# Patient Record
Sex: Female | Born: 1965 | Race: White | Hispanic: No | Smoking: Former smoker
Health system: Southern US, Community
[De-identification: ages and names within clinical notes are randomized; demographics above are authoritative.]

## PROBLEM LIST (undated history)

## (undated) DIAGNOSIS — G2581 Restless legs syndrome: Secondary | ICD-10-CM

## (undated) DIAGNOSIS — J449 Chronic obstructive pulmonary disease, unspecified: Secondary | ICD-10-CM

## (undated) DIAGNOSIS — M545 Low back pain, unspecified: Secondary | ICD-10-CM

## (undated) DIAGNOSIS — G4700 Insomnia, unspecified: Secondary | ICD-10-CM

## (undated) DIAGNOSIS — M1990 Unspecified osteoarthritis, unspecified site: Secondary | ICD-10-CM

## (undated) DIAGNOSIS — R799 Abnormal finding of blood chemistry, unspecified: Secondary | ICD-10-CM

## (undated) DIAGNOSIS — F411 Generalized anxiety disorder: Secondary | ICD-10-CM

## (undated) DIAGNOSIS — Z972 Presence of dental prosthetic device (complete) (partial): Secondary | ICD-10-CM

## (undated) DIAGNOSIS — K219 Gastro-esophageal reflux disease without esophagitis: Secondary | ICD-10-CM

## (undated) DIAGNOSIS — I209 Angina pectoris, unspecified: Secondary | ICD-10-CM

## (undated) DIAGNOSIS — F319 Bipolar disorder, unspecified: Secondary | ICD-10-CM

## (undated) DIAGNOSIS — C569 Malignant neoplasm of unspecified ovary: Secondary | ICD-10-CM

## (undated) DIAGNOSIS — F13239 Sedative, hypnotic or anxiolytic dependence with withdrawal, unspecified: Secondary | ICD-10-CM

## (undated) DIAGNOSIS — K8020 Calculus of gallbladder without cholecystitis without obstruction: Secondary | ICD-10-CM

## (undated) DIAGNOSIS — C801 Malignant (primary) neoplasm, unspecified: Secondary | ICD-10-CM

## (undated) DIAGNOSIS — E785 Hyperlipidemia, unspecified: Secondary | ICD-10-CM

## (undated) DIAGNOSIS — F419 Anxiety disorder, unspecified: Secondary | ICD-10-CM

## (undated) DIAGNOSIS — R945 Abnormal results of liver function studies: Secondary | ICD-10-CM

## (undated) HISTORY — DX: Calculus of gallbladder without cholecystitis without obstruction: K80.20

## (undated) HISTORY — DX: Abnormal finding of blood chemistry, unspecified: R79.9

## (undated) HISTORY — DX: Unspecified osteoarthritis, unspecified site: M19.90

## (undated) HISTORY — DX: Bipolar disorder, unspecified: F31.9

## (undated) HISTORY — PX: ABDOMINAL HYSTERECTOMY: SHX81

## (undated) HISTORY — PX: BREAST BIOPSY: SHX20

## (undated) HISTORY — PX: OOPHORECTOMY: SHX86

## (undated) HISTORY — PX: MULTIPLE TOOTH EXTRACTIONS: SHX2053

## (undated) HISTORY — DX: Restless legs syndrome: G25.81

## (undated) HISTORY — DX: Generalized anxiety disorder: F41.1

## (undated) HISTORY — DX: Chronic obstructive pulmonary disease, unspecified: J44.9

## (undated) HISTORY — DX: Anxiety disorder, unspecified: F41.9

## (undated) HISTORY — DX: Hyperlipidemia, unspecified: E78.5

## (undated) HISTORY — PX: APPENDECTOMY: SHX54

## (undated) HISTORY — DX: Abnormal results of liver function studies: R94.5

---

## 1993-10-26 DIAGNOSIS — C569 Malignant neoplasm of unspecified ovary: Secondary | ICD-10-CM

## 1993-10-26 DIAGNOSIS — C801 Malignant (primary) neoplasm, unspecified: Secondary | ICD-10-CM

## 1993-10-26 HISTORY — DX: Malignant neoplasm of unspecified ovary: C56.9

## 1993-10-26 HISTORY — DX: Malignant (primary) neoplasm, unspecified: C80.1

## 2003-05-17 ENCOUNTER — Emergency Department (HOSPITAL_COMMUNITY): Admission: EM | Admit: 2003-05-17 | Discharge: 2003-05-17 | Payer: Self-pay | Admitting: *Deleted

## 2003-10-27 HISTORY — PX: LIVER BIOPSY: SHX301

## 2004-09-28 ENCOUNTER — Emergency Department: Payer: Self-pay | Admitting: Unknown Physician Specialty

## 2004-11-06 ENCOUNTER — Ambulatory Visit: Payer: Self-pay | Admitting: Internal Medicine

## 2004-11-26 DIAGNOSIS — R7989 Other specified abnormal findings of blood chemistry: Secondary | ICD-10-CM

## 2004-11-26 HISTORY — DX: Other specified abnormal findings of blood chemistry: R79.89

## 2004-12-27 ENCOUNTER — Ambulatory Visit: Payer: Self-pay | Admitting: Internal Medicine

## 2006-07-20 ENCOUNTER — Emergency Department: Payer: Self-pay | Admitting: Unknown Physician Specialty

## 2006-07-21 ENCOUNTER — Ambulatory Visit: Payer: Self-pay | Admitting: Emergency Medicine

## 2007-06-26 ENCOUNTER — Emergency Department: Payer: Self-pay | Admitting: Emergency Medicine

## 2007-08-26 ENCOUNTER — Emergency Department: Payer: Self-pay | Admitting: Emergency Medicine

## 2008-11-05 ENCOUNTER — Inpatient Hospital Stay: Payer: Self-pay | Admitting: Unknown Physician Specialty

## 2009-01-13 ENCOUNTER — Emergency Department (HOSPITAL_COMMUNITY): Admission: EM | Admit: 2009-01-13 | Discharge: 2009-01-13 | Payer: Self-pay | Admitting: Emergency Medicine

## 2011-02-05 LAB — BASIC METABOLIC PANEL
CO2: 26 mEq/L (ref 19–32)
Chloride: 101 mEq/L (ref 96–112)
GFR calc Af Amer: >60 mL/min (ref >60)
Potassium: 3.2 mEq/L — ABNORMAL LOW (ref 3.5–5.1)
Sodium: 136 mEq/L (ref 135–145)

## 2011-02-05 LAB — CBC
HCT: 39 % (ref 36.0–46.0)
Hemoglobin: 13.8 g/dL (ref 12.0–15.0)
MCHC: 35.5 g/dL (ref 30.0–36.0)
MCV: 89.8 fL (ref 78.0–100.0)
RBC: 4.34 MIL/uL (ref 3.87–5.11)
WBC: 8.6 10*3/uL (ref 4.0–10.5)

## 2011-02-05 LAB — RAPID URINE DRUG SCREEN, HOSP PERFORMED
Barbiturates: NOT DETECTED
Benzodiazepines: NOT DETECTED

## 2011-02-05 LAB — ETHANOL: Alcohol, Ethyl (B): <5 mg/dL (ref 0–10)

## 2011-02-05 LAB — DIFFERENTIAL
Basophils Relative: 0 % (ref 0–1)
Eosinophils Absolute: 0 10*3/uL (ref 0.0–0.7)
Eosinophils Relative: 0 % (ref 0–5)
Lymphs Abs: 1.9 10*3/uL (ref 0.7–4.0)
Monocytes Absolute: 0.4 10*3/uL (ref 0.1–1.0)
Monocytes Relative: 5 % (ref 3–12)
Neutrophils Relative %: 72 % (ref 43–77)

## 2011-04-15 ENCOUNTER — Encounter: Payer: Self-pay | Admitting: Family Medicine

## 2011-06-18 ENCOUNTER — Emergency Department (HOSPITAL_COMMUNITY): Payer: No Typology Code available for payment source

## 2011-06-18 ENCOUNTER — Emergency Department (HOSPITAL_COMMUNITY)
Admission: EM | Admit: 2011-06-18 | Discharge: 2011-06-18 | Disposition: A | Discharge status: Home / Self Care | Payer: No Typology Code available for payment source | Attending: Emergency Medicine | Admitting: Emergency Medicine

## 2011-06-18 ENCOUNTER — Encounter (HOSPITAL_COMMUNITY): Payer: Self-pay | Admitting: Emergency Medicine

## 2011-06-18 DIAGNOSIS — E785 Hyperlipidemia, unspecified: Secondary | ICD-10-CM | POA: Insufficient documentation

## 2011-06-18 DIAGNOSIS — S161XXA Strain of muscle, fascia and tendon at neck level, initial encounter: Secondary | ICD-10-CM

## 2011-06-18 DIAGNOSIS — J4489 Other specified chronic obstructive pulmonary disease: Secondary | ICD-10-CM | POA: Insufficient documentation

## 2011-06-18 DIAGNOSIS — F319 Bipolar disorder, unspecified: Secondary | ICD-10-CM | POA: Insufficient documentation

## 2011-06-18 DIAGNOSIS — S139XXA Sprain of joints and ligaments of unspecified parts of neck, initial encounter: Secondary | ICD-10-CM | POA: Insufficient documentation

## 2011-06-18 DIAGNOSIS — J449 Chronic obstructive pulmonary disease, unspecified: Secondary | ICD-10-CM | POA: Insufficient documentation

## 2011-06-18 MED ORDER — CYCLOBENZAPRINE HCL 10 MG PO TABS: 10.0000 mg | ORAL_TABLET | Freq: Two times a day (BID) | ORAL | Status: AC | PRN

## 2011-06-18 MED ORDER — IBUPROFEN 600 MG PO TABS: 600.0000 mg | ORAL_TABLET | Freq: Four times a day (QID) | ORAL | Status: AC | PRN
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
## 2011-06-22 NOTE — ED Provider Notes (Signed)
Medical screening examination/treatment/procedure(s) were performed by non-physician practitioner and as supervising physician I was immediately available for consultation/collaboration.  Benny Lennert, MD 06/22/11 1021

## 2012-06-16 ENCOUNTER — Ambulatory Visit: Payer: Self-pay | Admitting: Family Medicine

## 2013-02-08 ENCOUNTER — Other Ambulatory Visit: Payer: Self-pay | Admitting: Family Medicine

## 2013-02-08 ENCOUNTER — Telehealth: Payer: Self-pay | Admitting: Physician Assistant

## 2013-02-08 MED ORDER — ROSUVASTATIN CALCIUM 10 MG PO TABS: 10.0000 mg | ORAL_TABLET | Freq: Every day | ORAL | Status: DC

## 2013-02-08 NOTE — Telephone Encounter (Signed)
MBD This is your pt.

## 2013-02-08 NOTE — Telephone Encounter (Signed)
Medication refilled per protocol. 

## 2013-02-08 NOTE — Telephone Encounter (Signed)
Ok to give 2 additional refills on risperdal and prn refills on welchol

## 2013-02-08 NOTE — Telephone Encounter (Signed)
..?   OK to Refill Was not sure about refilling the Risperdol

## 2013-03-08 ENCOUNTER — Encounter: Payer: Self-pay | Admitting: Physician Assistant

## 2013-03-08 ENCOUNTER — Ambulatory Visit (INDEPENDENT_AMBULATORY_CARE_PROVIDER_SITE_OTHER): Payer: Medicare Other | Admitting: Physician Assistant

## 2013-03-08 VITALS — BP 100/70 | HR 82 | Temp 97.5°F | Resp 18 | Ht 62.5 in | Wt 177.0 lb

## 2013-03-08 DIAGNOSIS — F319 Bipolar disorder, unspecified: Secondary | ICD-10-CM

## 2013-03-08 DIAGNOSIS — J449 Chronic obstructive pulmonary disease, unspecified: Secondary | ICD-10-CM | POA: Insufficient documentation

## 2013-03-08 DIAGNOSIS — M26609 Unspecified temporomandibular joint disorder, unspecified side: Secondary | ICD-10-CM

## 2013-03-08 DIAGNOSIS — G2581 Restless legs syndrome: Secondary | ICD-10-CM

## 2013-03-08 DIAGNOSIS — J4489 Other specified chronic obstructive pulmonary disease: Secondary | ICD-10-CM

## 2013-03-08 DIAGNOSIS — E785 Hyperlipidemia, unspecified: Secondary | ICD-10-CM

## 2013-03-08 DIAGNOSIS — R7989 Other specified abnormal findings of blood chemistry: Secondary | ICD-10-CM

## 2013-03-08 HISTORY — DX: Bipolar disorder, unspecified: F31.9

## 2013-03-08 MED ORDER — BUPROPION HCL ER (SR) 150 MG PO TB12: 150.0000 mg | ORAL_TABLET | Freq: Every day | ORAL | Status: DC

## 2013-03-08 NOTE — Progress Notes (Signed)
Patient ID: Stacy Chambers MRN: [id number], DOB: [birth year], 47 y.o. Date of Encounter: @DATE @  Chief Complaint:  Chief Complaint  Patient presents with  . other    h/a,j aw poppin    HPI: 47 y.o. year old female  presents for:  1- Last week, and again last night, when eating a sandwich, her left jaw locked.It has been popping some. Really doesnot feel much pain there. She does admit to "bad habit of grinding her teeth."   2- F/U Bipolar D/O: AT LOV 12/28/12 she reported that her insurance had changed. Her psychiatrist does not accept her new insurance. As well, her new insurance does not cover Saphris, one of the psych meds she was on at that time. At that time, we replaced Saphris with Risperdal 0.5mg  BID. She says this is working well. Her mood has been stable and she feels that current meds are working perfectly. Has no adverse effects.   3- Hyperlipidemia: On Crestor and Welchol. No myalgias or other adverse effects.    Past Medical History  Diagnosis Date  . RLS (restless legs syndrome)   . Bipolar disorder   . COPD (chronic obstructive pulmonary disease)   . Hyperlipidemia   . Elevated liver function tests 11/26/2004    H/O NEGATIVE LIVER BIOPSY, workup negative  . Restless legs syndrome (RLS)      Home Meds: See attached medication section for current medication list. Any medications entered into computer today will not appear on this note's list. The medications listed below were entered prior to today. Current Outpatient Prescriptions on File Prior to Visit  Medication Sig Dispense Refill  . clonazePAM (KLONOPIN) 1 MG tablet Take 1 mg by mouth 2 (two) times daily.        . risperiDONE (RISPERDAL) 0.5 MG tablet TAKE 1 TABLET TWICE A DAY  60 tablet  2  . rOPINIRole (REQUIP) 1 MG tablet Take 1 mg by mouth at bedtime.        . rosuvastatin (CRESTOR) 10 MG tablet Take 1 tablet (10 mg total) by mouth daily.  30 tablet  2  . WELCHOL 625 MG tablet TAKE 3 TABS BY MOUTH TWICE  DAILY  180 tablet  11   No current facility-administered medications on file prior to visit.    Allergies:  Allergies  Allergen Reactions  . Benadryl (Diphenhydramine Hcl)     Hives/ rapid heartrate    History   Social History  . Marital Status: Married    Spouse Name: N/A    Number of Children: N/A  . Years of Education: N/A   Occupational History  . Not on file.   Social History Main Topics  . Smoking status: Never Smoker   . Smokeless tobacco: Not on file  . Alcohol Use: No  . Drug Use: Yes    Special: Marijuana  . Sexually Active: Yes    Birth Control/ Protection: Surgical   Other Topics Concern  . Not on file   Social History Narrative  . No narrative on file    Family History  Problem Relation Age of Onset  . Cancer Mother   . Cancer Father   . COPD Father   . Stroke Father      Review of Systems:  See HPI for pertinent ROS. All other ROS negative.    Physical Exam: Blood pressure 100/70, pulse 82, temperature 97.5 F (36.4 C), temperature source Oral, resp. rate 18, height 5' 2.5" (1.588 m), weight 177 lb (80.287 kg).,  Body mass index is 31.84 kg/(m^2). General: WNWD WF.  Appears in no acute distress. Head: Normocephalic, atraumatic, eyes without discharge, sclera non-icteric, nares are without discharge. Bilateral auditory canals clear, TM's are without perforation, pearly grey and translucent with reflective cone of light bilaterally. Oral cavity moist, posterior pharynx without exudate, erythema, peritonsillar abscess, or post nasal drip. Minimal tenderness with palpation of TM Joint. No popping, crepitus, of joint with opening and closing of jaw.    Neck: Supple. No thyromegaly. No lymphadenopathy. Lungs: Clear bilaterally to auscultation without wheezes, rales, or rhonchi. Breathing is unlabored. Heart: RRR with S1 S2. No murmurs, rubs, or gallops. Abdomen: No tenderness with palpation. No mass, organomegaly. Musculoskeletal:  Strength and tone  normal for age. Extremities/Skin: Warm and dry. No clubbing or cyanosis. No edema. No rashes or suspicious lesions. Neuro: Alert and oriented X 3. Moves all extremities spontaneously. Gait is normal. CNII-XII grossly in tact. Psych:  Responds to questions appropriately with a normal affect.     ASSESSMENT AND PLAN:  47 y.o. year old female with  1. TMJ (temporomandibular joint disorder) Avoid taking big bites of food. Avoid hard, crunchy foods. Must stop teeth grinding. Take OTC Nsaids with food for 2 weeks. If does not resolve, f/u with Dentist/TMJ specialist. May need mouth guard.   2. Bipolar disorder, unspecified Well controlled on current meds. Wil cont current meds. She still needs to sched f/u with a new psychiatrist for f/u. Cont Wellbutrin and Resperdal.  - buPROPion (WELLBUTRIN SR) 150 MG 12 hr tablet; Take 1 tablet (150 mg total) by mouth daily.  Dispense: 30 tablet; Refill: 5  3. Hyperlipidemia On Crestor and Bupropiaon. Just had FLP/LFT 12/28/12.  4. Elevated liver function tests H/O complete work up including liver biopsy which was all normal/negative. LFTs stable on statin.  5. COPD (chronic obstructive pulmonary disease)  6. Restless legs syndrome (RLS) Controlled with current dose Requip.  ROV in 3 months to f/u.  [name] [place], [place], [facility] 03/08/2013 2:01 PM

## 2013-03-11 ENCOUNTER — Encounter: Payer: Self-pay | Admitting: Physician Assistant

## 2013-03-21 ENCOUNTER — Encounter: Payer: Self-pay | Admitting: Physician Assistant

## 2013-04-10 ENCOUNTER — Telehealth: Payer: Self-pay | Admitting: Family Medicine

## 2013-04-10 MED ORDER — ROPINIROLE HCL 1 MG PO TABS: 1.0000 mg | ORAL_TABLET | Freq: Every day | ORAL | Status: DC

## 2013-04-10 NOTE — Telephone Encounter (Signed)
Rx Refilled  

## 2013-04-20 LAB — HM DEXA SCAN

## 2013-05-07 ENCOUNTER — Other Ambulatory Visit: Payer: Self-pay | Admitting: Physician Assistant

## 2013-05-23 ENCOUNTER — Telehealth: Payer: Self-pay | Admitting: Physician Assistant

## 2013-05-24 MED ORDER — CLONAZEPAM 1 MG PO TABS: 1.0000 mg | ORAL_TABLET | Freq: Two times a day (BID) | ORAL | Status: DC

## 2013-05-24 NOTE — Telephone Encounter (Signed)
She gets this from mental health provider.  Pt called and told to request from [facility] provider.

## 2013-05-24 NOTE — Telephone Encounter (Signed)
Has not seen Mental Health Provider since December due to insurance issues.  Has tried to get new one but no one will pick her up because old office is not sending records.  Is working with insurance to get new provider. Only use Klonopin as needed very rarely uses.  #30 called no refills

## 2013-06-07 ENCOUNTER — Encounter: Payer: Self-pay | Admitting: Physician Assistant

## 2013-06-08 ENCOUNTER — Ambulatory Visit: Payer: Medicare Other | Admitting: Physician Assistant

## 2013-06-09 ENCOUNTER — Ambulatory Visit: Payer: Medicare Other | Admitting: Physician Assistant

## 2013-07-03 ENCOUNTER — Encounter: Payer: Self-pay | Admitting: Physician Assistant

## 2013-07-10 ENCOUNTER — Emergency Department: Payer: Self-pay | Admitting: Emergency Medicine

## 2013-07-14 ENCOUNTER — Other Ambulatory Visit: Payer: Self-pay | Admitting: Physician Assistant

## 2013-08-31 ENCOUNTER — Other Ambulatory Visit: Payer: Self-pay

## 2013-09-18 ENCOUNTER — Encounter: Payer: Self-pay | Admitting: Family Medicine

## 2013-09-18 ENCOUNTER — Telehealth: Payer: Self-pay | Admitting: Family Medicine

## 2013-09-18 DIAGNOSIS — E785 Hyperlipidemia, unspecified: Secondary | ICD-10-CM

## 2013-09-18 MED ORDER — COLESEVELAM HCL 625 MG PO TABS: 1875.0000 mg | ORAL_TABLET | Freq: Two times a day (BID) | ORAL | Status: DC

## 2013-09-18 NOTE — Telephone Encounter (Signed)
Welchol 625.  Medication refill for one time only.  Patient needs to be seen.  Letter sent for patient to call and schedule

## 2013-09-20 ENCOUNTER — Emergency Department (HOSPITAL_COMMUNITY): Payer: No Typology Code available for payment source

## 2013-09-20 ENCOUNTER — Encounter (HOSPITAL_COMMUNITY): Payer: Self-pay | Admitting: Emergency Medicine

## 2013-09-20 ENCOUNTER — Emergency Department (HOSPITAL_COMMUNITY)
Admission: EM | Admit: 2013-09-20 | Discharge: 2013-09-20 | Disposition: A | Discharge status: Home / Self Care | Payer: No Typology Code available for payment source | Attending: Emergency Medicine | Admitting: Emergency Medicine

## 2013-09-20 DIAGNOSIS — T148XXA Other injury of unspecified body region, initial encounter: Secondary | ICD-10-CM

## 2013-09-20 DIAGNOSIS — Y9389 Activity, other specified: Secondary | ICD-10-CM | POA: Insufficient documentation

## 2013-09-20 DIAGNOSIS — J4489 Other specified chronic obstructive pulmonary disease: Secondary | ICD-10-CM | POA: Insufficient documentation

## 2013-09-20 DIAGNOSIS — J449 Chronic obstructive pulmonary disease, unspecified: Secondary | ICD-10-CM | POA: Insufficient documentation

## 2013-09-20 DIAGNOSIS — G2581 Restless legs syndrome: Secondary | ICD-10-CM | POA: Insufficient documentation

## 2013-09-20 DIAGNOSIS — IMO0002 Reserved for concepts with insufficient information to code with codable children: Secondary | ICD-10-CM | POA: Insufficient documentation

## 2013-09-20 DIAGNOSIS — E785 Hyperlipidemia, unspecified: Secondary | ICD-10-CM | POA: Insufficient documentation

## 2013-09-20 DIAGNOSIS — S298XXA Other specified injuries of thorax, initial encounter: Secondary | ICD-10-CM | POA: Insufficient documentation

## 2013-09-20 DIAGNOSIS — Y92410 Unspecified street and highway as the place of occurrence of the external cause: Secondary | ICD-10-CM | POA: Insufficient documentation

## 2013-09-20 DIAGNOSIS — M549 Dorsalgia, unspecified: Secondary | ICD-10-CM

## 2013-09-20 DIAGNOSIS — F319 Bipolar disorder, unspecified: Secondary | ICD-10-CM | POA: Insufficient documentation

## 2013-09-20 DIAGNOSIS — Z79899 Other long term (current) drug therapy: Secondary | ICD-10-CM | POA: Insufficient documentation

## 2013-09-20 DIAGNOSIS — S8990XA Unspecified injury of unspecified lower leg, initial encounter: Secondary | ICD-10-CM | POA: Insufficient documentation

## 2013-09-20 LAB — COMPREHENSIVE METABOLIC PANEL
AST: 61 U/L — ABNORMAL HIGH (ref 0–37)
BUN: 8 mg/dL (ref 6–23)
CO2: 22 mEq/L (ref 19–32)
Calcium: 10.2 mg/dL (ref 8.4–10.5)
Chloride: 100 mEq/L (ref 96–112)
Creatinine, Ser: 0.8 mg/dL (ref 0.50–1.10)
GFR calc non Af Amer: 86 mL/min — ABNORMAL LOW (ref >90)
Total Bilirubin: 0.5 mg/dL (ref 0.3–1.2)
Total Protein: 7.9 g/dL (ref 6.0–8.3)

## 2013-09-20 LAB — URINALYSIS, ROUTINE W REFLEX MICROSCOPIC
Glucose, UA: NEGATIVE mg/dL
Hgb urine dipstick: NEGATIVE
Protein, ur: NEGATIVE mg/dL
Urobilinogen, UA: 0.2 mg/dL (ref 0.0–1.0)
pH: 6 (ref 5.0–8.0)

## 2013-09-20 LAB — CBC WITH DIFFERENTIAL/PLATELET
Basophils Absolute: 0 10*3/uL (ref 0.0–0.1)
Eosinophils Absolute: 0.1 10*3/uL (ref 0.0–0.7)
Eosinophils Relative: 2 % (ref 0–5)
HCT: 39 % (ref 36.0–46.0)
Lymphocytes Relative: 34 % (ref 12–46)
Lymphs Abs: 2.9 10*3/uL (ref 0.7–4.0)
MCH: 30.3 pg (ref 26.0–34.0)
MCV: 88.2 fL (ref 78.0–100.0)
Monocytes Absolute: 0.5 10*3/uL (ref 0.1–1.0)
RDW: 12.3 % (ref 11.5–15.5)
WBC: 8.5 10*3/uL (ref 4.0–10.5)

## 2013-09-20 MED ORDER — IOHEXOL 300 MG/ML  SOLN
100.0000 mL | Freq: Once | INTRAMUSCULAR | Status: AC | PRN
  Administered 2013-09-20: 100 mL via INTRAVENOUS

## 2013-09-20 MED ORDER — MORPHINE SULFATE 4 MG/ML IJ SOLN
4.0000 mg | Freq: Once | INTRAMUSCULAR | Status: AC
  Administered 2013-09-20: 4 mg via INTRAVENOUS
  Filled 2013-09-20: qty 1

## 2013-09-20 MED ORDER — ONDANSETRON HCL 4 MG/2ML IJ SOLN
4.0000 mg | Freq: Once | INTRAMUSCULAR | Status: AC
  Administered 2013-09-20: 4 mg via INTRAVENOUS
  Filled 2013-09-20: qty 2

## 2013-09-20 MED ORDER — CYCLOBENZAPRINE HCL 10 MG PO TABS: 10.0000 mg | ORAL_TABLET | Freq: Two times a day (BID) | ORAL | Status: DC | PRN

## 2013-09-20 MED ORDER — OXYCODONE-ACETAMINOPHEN 5-325 MG PO TABS: 1.0000 | ORAL_TABLET | ORAL | Status: DC | PRN
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
## 2013-09-20 NOTE — ED Provider Notes (Signed)
Pt improved Ambulatory CT imaging of neck is negative Stable for d/c home   Joya Gaskins, MD 09/20/13 1754

## 2013-11-22 ENCOUNTER — Encounter: Payer: Self-pay | Admitting: Physician Assistant

## 2013-11-22 ENCOUNTER — Ambulatory Visit (INDEPENDENT_AMBULATORY_CARE_PROVIDER_SITE_OTHER): Payer: 59 | Admitting: Physician Assistant

## 2013-11-22 ENCOUNTER — Ambulatory Visit: Payer: Medicare Other | Admitting: Physician Assistant

## 2013-11-22 VITALS — BP 104/66 | HR 80 | Temp 98.9°F | Resp 18 | Ht 63.5 in | Wt 151.0 lb

## 2013-11-22 DIAGNOSIS — Z0000 Encounter for general adult medical examination without abnormal findings: Secondary | ICD-10-CM

## 2013-11-22 DIAGNOSIS — Z23 Encounter for immunization: Secondary | ICD-10-CM

## 2013-11-22 NOTE — Progress Notes (Signed)
Patient ID: Stacy Chambers MRN: [id number], DOB: [birth year], 48 y.o. Date of Encounter: 11/22/2013,   Chief Complaint: Physical (CPE)  HPI: 48 y.o. y/o white female  here for CPE.   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  [name] only recently started to come to our office. She had been going to Dr. Germain Osgood.   Today she is here to update her preventive care. She has no active complaints today.   Review of Systems: Consitutional: No fever, chills, fatigue, night sweats, lymphadenopathy. No significant/unexplained weight changes. Eyes: No visual changes, eye redness, or discharge. ENT/Mouth: No ear pain, sore throat, nasal drainage, or sinus pain. Cardiovascular: No chest pressure,heaviness, tightness or squeezing, even with exertion. No increased shortness of breath or dyspnea on exertion.No palpitations, edema, orthopnea, PND. Respiratory: No cough, hemoptysis, SOB, or wheezing. Gastrointestinal: No anorexia, dysphagia, reflux, pain, nausea, vomiting, hematemesis, diarrhea, constipation, BRBPR, or melena. Breast: No mass, nodules, bulging, or retraction. No skin changes or inflammation. No nipple discharge. No lymphadenopathy. Genitourinary: No dysuria, hematuria, incontinence, vaginal discharge, pruritis, burning, abnormal bleeding, or pain. Musculoskeletal: No decreased ROM, No joint pain or swelling. No significant pain in neck, back, or extremities. Skin: No rash, pruritis, or concerning lesions. Neurological: No headache, dizziness, syncope, seizures, tremors, memory loss, coordination problems, or paresthesias. Psychological: No anxiety, depression, hallucinations, SI/HI. Endocrine: No polydipsia, polyphagia, polyuria, or known diabetes.No increased fatigue. No palpitations/rapid  heart rate. No significant/unexplained weight change. All other systems were reviewed and are otherwise negative.  Past Medical History  Diagnosis Date  . RLS (restless legs syndrome)   . Bipolar disorder   . COPD (chronic obstructive pulmonary disease)   . Hyperlipidemia   . Elevated liver function tests 11/26/2004    H/O NEGATIVE LIVER BIOPSY, workup negative  . Restless legs syndrome (RLS)      Past Surgical History  Procedure Laterality Date  . Abdominal hysterectomy    . Oophorectomy    . Cesarean section    . Liver biopsy  2005    PerPt: Done to eval elevated LFTs and biopsy provided no definite dx/cause of elevated LFTs    Home Meds:  Current Outpatient Prescriptions on File Prior to Visit  Medication Sig Dispense Refill  . alendronate (FOSAMAX) 70 MG tablet Take 70 mg by mouth once a week. Take with a full glass of water on an empty stomach.      . clonazePAM (KLONOPIN) 1 MG tablet Take 1 mg by mouth 3 (three) times daily.      . colesevelam (WELCHOL) 625 MG tablet Take 3 tablets (1,875 mg total) by mouth 2 (two) times daily with a meal.  180 tablet  0  . CRESTOR 10 MG tablet TAKE 1 TABLET (10 MG TOTAL) BY MOUTH DAILY.  30 tablet  2  . ziprasidone (GEODON) 40 MG capsule Take 40 mg by mouth at bedtime.        No current facility-administered medications on file prior to visit.    Allergies:  Allergies  Allergen Reactions  . Benadryl [Diphenhydramine Hcl]     Hives/ rapid heartrate  . Vicodin [Hydrocodone-Acetaminophen] Other (See Comments)    dizziness    History   Social History  . Marital Status: Married    Spouse Name: N/A    Number of Children:  N/A  . Years of Education: N/A   Occupational History  . Not on file.   Social History Main Topics  . Smoking status: Never Smoker   . Smokeless tobacco: Never Used  . Alcohol Use: No  . Drug Use: Yes    Special: Marijuana  . Sexual Activity: Yes    Birth Control/ Protection: Surgical   Other Topics  Concern  . Not on file   Social History Narrative  . No narrative on file    Family History  Problem Relation Age of Onset  . COPD Mother   . Cancer Father     Lung CA, Skin Cancer--?type  . COPD Father   . Stroke Father   . Cancer Sister 21    cervical cancer  . COPD Brother   . Alcohol abuse Brother   . Stroke Brother     Physical Exam: Blood pressure 104/66, pulse 80, temperature 98.9 F (37.2 C), temperature source Oral, resp. rate 18, height 5' 3.5" (1.613 m), weight 151 lb (68.493 kg)., Body mass index is 26.33 kg/(m^2). General: Well developed, well nourished, WF. Appears in no acute distress. HEENT: Normocephalic, atraumatic. Conjunctiva pink, sclera non-icteric. Pupils 2 mm constricting to 1 mm, round, regular, and equally reactive to light and accomodation. EOMI. Internal auditory canal clear. TMs with good cone of light and without pathology. Nasal mucosa pink. Nares are without discharge. No sinus tenderness. Oral mucosa pink.  Pharynx without exudate.   Neck: Supple. Trachea midline. No thyromegaly. Full ROM. No lymphadenopathy.No Carotid Bruits. Lungs: Clear to auscultation bilaterally without wheezes, rales, or rhonchi. Breathing is of normal effort and unlabored. Cardiovascular: RRR with S1 S2. No murmurs, rubs, or gallops. Distal pulses 2+ symmetrically. No carotid or abdominal bruits. Breast: Symmetrical. No masses. Nipples without discharge. Abdomen: Soft, non-tender, non-distended with normoactive bowel sounds. No hepatosplenomegaly or masses. No rebound/guarding. No CVA tenderness. No hernias.  Genitourinary:  External genitalia without lesions. Vaginal mucosa pink.No discharge present.  No adnexal mass or tenderness.  Pap smear taken Musculoskeletal: Full range of motion and 5/5 strength throughout. Without swelling, atrophy, tenderness, crepitus, or warmth. Extremities without clubbing, cyanosis, or edema. Calves supple. Skin: Warm and moist without erythema,  ecchymosis, wounds, or rash. Neuro: A+Ox3. CN II-XII grossly intact. Moves all extremities spontaneously. Full sensation throughout. Normal gait. DTR 2+ throughout upper and lower extremities. Finger to nose intact. Psych:  Responds to questions appropriately with a normal affect.   Assessment/Plan:  48 y.o. y/o female here for CPE 1. Visit for preventive health examination  A. Screening Labs: She provides printed out copies of lab results to Dr. Brunetta Genera had done at his office. She has results from 09/27/13, 06/28/13, 03/28/13. I have kept a copy of the labs dated 09/27/13 and will have these scanned into the computer system. The following labs were included: CBC, CMET, TSH, Vit D, FLP CBC was completely normal.  CMET completely normal-- except Alk Phos 246 / Alt 49 (she has history of known elevated LFTs and has undergone an evaluation of this including liver biopsy. She reports that the liver biopsy  revealed no diagnoses/definitive reason for the elevated LFTs) TSH, T4, T3, all normal. Vitamin D low at 19.5 Total cholesterol 159  Triglyceride 135 HDL 53 LDL 79   B. Pap: She reports that she had complete hysterectomy with bilateral oophorectomy at age 71 secondary to both ovarian and uterine cancer. She states that she hasn't had a Pap smear in over 10 years. Because her hysterectomy was done secondary to a cancer we do need to do Pap smear. Pap smear obtained today and sent.  C. Screening Mammogram: She states she has had a mammogram. States that actually did the referral for her approximately one year ago and she had this done in [place] and it was normal. After her visit, I was able to go back to her paper chart. Last mammogram was 06/16/2012. Negative. Given her history of uterine and ovarian cancer and her age of 62, should  go ahead and be getting annual mammograms. I will go ahead  and order followup mammogram.  D. DEXA/BMD: She brought in a printed copy of DEXA/BMD results which were done by Dr. Brunetta Genera dated 04/20/13. T score of the hip was -1.9.  T score of the third poiesis spine was -2.04.  She is now on Fosamax. It states that the Fosamax was started right after obtaining the above DEXA reports. States that that was her first and only DEXA/PND she has had.  E. Colorectal Cancer Screening: She reports that she had this been endoscopy and colonoscopy in approximately 2004 2005. Says it has revealed polyps but with no cancer. This was performed at the [facility]. This was done by the same GI doctor who did her liver biopsy. After her visit, I reviewed her paper chart and can find no documentation of her prior colonoscopy. I will go ahead and refer her back to the [facility] .  F. Immunizations:  Tetanus: Patient reports that her last tetanus was over 10 years ago. She is agreeable to update this today.    - PAP, Thin Prep w/HPV rflx HPV Type 16/18  2. Need for Tdap vaccination - Tdap vaccine greater than or equal to 7yo IM   Signed, [address] [place], [place], [facility] 11/22/2013 1:39 PM

## 2013-11-23 LAB — PAP, THIN PREP W/HPV RFLX HPV TYPE 16/18: HPV DNA HIGH RISK: NOT DETECTED

## 2013-11-28 ENCOUNTER — Encounter: Payer: Self-pay | Admitting: Family Medicine

## 2013-11-28 ENCOUNTER — Encounter: Payer: Self-pay | Admitting: Physician Assistant

## 2013-11-28 NOTE — Telephone Encounter (Signed)
Pt wants to know, if instead of taking two cholesterol medications, can she just increase her Crestor to 20 mg and take only that?

## 2013-12-06 ENCOUNTER — Emergency Department: Payer: Self-pay | Admitting: Internal Medicine

## 2014-01-01 ENCOUNTER — Ambulatory Visit: Payer: Self-pay | Admitting: Family Medicine

## 2014-01-02 ENCOUNTER — Telehealth: Payer: Self-pay | Admitting: Physician Assistant

## 2014-01-02 DIAGNOSIS — S6992XA Unspecified injury of left wrist, hand and finger(s), initial encounter: Secondary | ICD-10-CM

## 2014-01-02 NOTE — Telephone Encounter (Signed)
Call back number is [phone] Pt is needing a referral to hand specialist, because of a sprain with torn ligaments in her left hand, she had been seen at the ER and they hand specialist told her that she would need a referral from her primary care doctor

## 2014-01-05 NOTE — Telephone Encounter (Signed)
Patient was sen in [place].  I told her we will ned those records for specialist.  Also they gave her a name of who they recommended she see.  She is going to call us with that name when she gets home.

## 2014-01-05 NOTE — Telephone Encounter (Signed)
Pt called back and said they referred her to a Dr Tamala Julian, Orthopedic and Hand Surg, [place].  Office phone [phone]  Referral initiated

## 2014-01-11 ENCOUNTER — Ambulatory Visit: Payer: Self-pay | Admitting: Family Medicine

## 2014-01-23 ENCOUNTER — Telehealth: Payer: Self-pay | Admitting: Physician Assistant

## 2014-01-23 DIAGNOSIS — E785 Hyperlipidemia, unspecified: Secondary | ICD-10-CM

## 2014-01-23 NOTE — Telephone Encounter (Signed)
Call back number is [phone] Pharmacy [facility] [place] Pt is needing a refill on CRESTOR 10 MG tablet

## 2014-01-24 ENCOUNTER — Encounter: Payer: Self-pay | Admitting: Family Medicine

## 2014-01-24 MED ORDER — ROSUVASTATIN CALCIUM 10 MG PO TABS: 10.0000 mg | ORAL_TABLET | Freq: Every day | ORAL | Status: DC

## 2014-01-24 NOTE — Telephone Encounter (Signed)
Medication refilled per protocol. 

## 2014-02-01 ENCOUNTER — Encounter: Payer: Self-pay | Admitting: Family Medicine

## 2014-02-19 ENCOUNTER — Telehealth: Payer: Self-pay | Admitting: Family Medicine

## 2014-02-19 NOTE — Telephone Encounter (Signed)
Pt calling.  Wants to resume Requip for her restless legs.  Her mental health provider said she didn't need anymore because they has put her on Geodon and said the Clonazepam would work for her RLS.  The [place] provider has stopped the Geodon and resumed the [place].  Now she want to resume Requip.

## 2014-02-19 NOTE — Telephone Encounter (Signed)
We should not mix this Requip with these other medications--recommend stay off the Requip for now.

## 2014-02-22 NOTE — Telephone Encounter (Signed)
Pt told provider feels she should not resume Requip.  She is going to call her Mental Health provider.  They had originally told her to stop and now having issues with her other meds as well.

## 2014-03-15 ENCOUNTER — Other Ambulatory Visit: Payer: Self-pay | Admitting: Physician Assistant

## 2014-03-15 NOTE — Telephone Encounter (Signed)
Refill appropriate and filled per protocol. 

## 2014-04-18 ENCOUNTER — Other Ambulatory Visit: Payer: Self-pay | Admitting: Physician Assistant

## 2014-04-18 NOTE — Telephone Encounter (Signed)
Medication refilled per protocol. 

## 2014-04-24 ENCOUNTER — Emergency Department (HOSPITAL_COMMUNITY)
Admission: EM | Admit: 2014-04-24 | Discharge: 2014-04-24 | Discharge status: Against Medical Advice | Payer: Medicare FFS | Attending: Emergency Medicine | Admitting: Emergency Medicine

## 2014-04-24 ENCOUNTER — Encounter (HOSPITAL_COMMUNITY): Payer: Self-pay | Admitting: Emergency Medicine

## 2014-04-24 ENCOUNTER — Telehealth: Payer: Self-pay | Admitting: Family Medicine

## 2014-04-24 ENCOUNTER — Emergency Department (HOSPITAL_COMMUNITY): Payer: Medicare FFS

## 2014-04-24 DIAGNOSIS — J449 Chronic obstructive pulmonary disease, unspecified: Secondary | ICD-10-CM | POA: Insufficient documentation

## 2014-04-24 DIAGNOSIS — X58XXXA Exposure to other specified factors, initial encounter: Secondary | ICD-10-CM | POA: Insufficient documentation

## 2014-04-24 DIAGNOSIS — Y9302 Activity, running: Secondary | ICD-10-CM | POA: Diagnosis not present

## 2014-04-24 DIAGNOSIS — E785 Hyperlipidemia, unspecified: Secondary | ICD-10-CM

## 2014-04-24 DIAGNOSIS — S0510XA Contusion of eyeball and orbital tissues, unspecified eye, initial encounter: Secondary | ICD-10-CM | POA: Diagnosis present

## 2014-04-24 DIAGNOSIS — Y92009 Unspecified place in unspecified non-institutional (private) residence as the place of occurrence of the external cause: Secondary | ICD-10-CM | POA: Diagnosis not present

## 2014-04-24 DIAGNOSIS — J4489 Other specified chronic obstructive pulmonary disease: Secondary | ICD-10-CM | POA: Insufficient documentation

## 2014-04-24 MED ORDER — COLESEVELAM HCL 625 MG PO TABS: 1875.0000 mg | ORAL_TABLET | Freq: Two times a day (BID) | ORAL | Status: DC

## 2014-04-24 MED ORDER — ROSUVASTATIN CALCIUM 10 MG PO TABS: 10.0000 mg | ORAL_TABLET | Freq: Every day | ORAL | Status: DC

## 2014-04-24 NOTE — Telephone Encounter (Signed)
Medication refilled per protocol. 

## 2014-04-24 NOTE — ED Notes (Signed)
Went in to pt's room, no pt in room or bathroom,

## 2014-04-24 NOTE — ED Notes (Signed)
Pt states she ran into a metal laundry rack at home, rt eye has dark red/black sclera.

## 2014-04-24 NOTE — ED Notes (Signed)
Pt seen walking out of department by Jocelyn Lamer NT,

## 2014-04-24 NOTE — ED Provider Notes (Signed)
3:05 PM  Pt left without being seen by a provider.  I was not involved in this patient's care as she had left the ED prior to me seeing her.  [place], DO 04/24/14 [phone]

## 2014-04-26 ENCOUNTER — Telehealth: Payer: Self-pay | Admitting: Family Medicine

## 2014-04-26 MED ORDER — ALENDRONATE SODIUM 70 MG PO TABS: 70.0000 mg | ORAL_TABLET | ORAL | Status: DC

## 2014-04-26 NOTE — Telephone Encounter (Signed)
Medication refilled per protocol. 

## 2014-05-07 ENCOUNTER — Telehealth: Payer: Self-pay | Admitting: *Deleted

## 2014-05-07 NOTE — Telephone Encounter (Signed)
Pt came in to see if we can tell if she had flea bites or chiggers, she has been putting on caladryl lotion, clear finger nail polish on pt, I looked at the spot and looked like flea bite, told pt to try OTC antihistamine to help with the itching, pt is going to buy OTC allerga,etc to see if that helps if not she will make appt to be seen as instructed.

## 2014-05-07 NOTE — Telephone Encounter (Signed)
Message copied by Maureen Chatters on Mon May 07, 2014 10:12 AM ------      Message from: Devoria Glassing      Created: Mon May 07, 2014  9:39 AM       [phone]      Patient says she possibly has flea bites all over her, does not have the money to come in and see anyone would like to know if we could tell her what she could do for this  ------

## 2014-05-23 ENCOUNTER — Ambulatory Visit (INDEPENDENT_AMBULATORY_CARE_PROVIDER_SITE_OTHER): Payer: 59 | Admitting: Physician Assistant

## 2014-05-23 ENCOUNTER — Encounter: Payer: Self-pay | Admitting: Physician Assistant

## 2014-05-23 VITALS — BP 126/86 | HR 68 | Temp 97.4°F | Resp 18 | Wt 160.0 lb

## 2014-05-23 DIAGNOSIS — E785 Hyperlipidemia, unspecified: Secondary | ICD-10-CM

## 2014-05-23 DIAGNOSIS — R7989 Other specified abnormal findings of blood chemistry: Secondary | ICD-10-CM

## 2014-05-23 DIAGNOSIS — G2581 Restless legs syndrome: Secondary | ICD-10-CM

## 2014-05-23 DIAGNOSIS — J439 Emphysema, unspecified: Secondary | ICD-10-CM

## 2014-05-23 DIAGNOSIS — F319 Bipolar disorder, unspecified: Secondary | ICD-10-CM

## 2014-05-23 DIAGNOSIS — R945 Abnormal results of liver function studies: Secondary | ICD-10-CM

## 2014-05-23 DIAGNOSIS — J438 Other emphysema: Secondary | ICD-10-CM

## 2014-05-23 LAB — LIPID PANEL
CHOLESTEROL: 166 mg/dL (ref 0–200)
HDL: 77 mg/dL (ref >39)
LDL Cholesterol: 63 mg/dL (ref 0–99)
TRIGLYCERIDES: 132 mg/dL (ref <150)
Total CHOL/HDL Ratio: 2.2 Ratio
VLDL: 26 mg/dL (ref 0–40)

## 2014-05-23 LAB — COMPLETE METABOLIC PANEL WITH GFR
ALT: 46 U/L — ABNORMAL HIGH (ref 0–35)
AST: 37 U/L (ref 0–37)
Albumin: 4.8 g/dL (ref 3.5–5.2)
Alkaline Phosphatase: 162 U/L — ABNORMAL HIGH (ref 39–117)
BILIRUBIN TOTAL: 0.5 mg/dL (ref 0.2–1.2)
BUN: 11 mg/dL (ref 6–23)
CALCIUM: 10.1 mg/dL (ref 8.4–10.5)
CHLORIDE: 103 meq/L (ref 96–112)
CO2: 25 meq/L (ref 19–32)
CREATININE: 0.75 mg/dL (ref 0.50–1.10)
GFR, Est African American: >89 mL/min
GLUCOSE: 98 mg/dL (ref 70–99)
Potassium: 4.4 mEq/L (ref 3.5–5.3)
Sodium: 139 mEq/L (ref 135–145)
Total Protein: 7.3 g/dL (ref 6.0–8.3)

## 2014-05-23 NOTE — Progress Notes (Signed)
Patient ID: Stacy Chambers MRN: [id number], DOB: January 15, 1966, 48 y.o. Date of Encounter: 05/23/2014,   Chief Complaint: Routine followup office visit and labs.  HPI: 48 y.o. y/o white female  here for routine followup office visit and labs.  I did not realize until our conversation at [place] 11/22/2013, that patient is actually the sister of a prior patient of mine, [employer], who, at an early age, developed cervical cancer with metastasis and actually has passed away from this.  Ms. Toney only recently started to come to our office. She had been going to Dr. Germain Osgood.  She had a regular office visit with me 03/08/2013. She had a complete physical exam with me 11/22/2013.   She has no specific complaints today. She is taking both Crestor and WelChol as directed. No myalgias or other adverse effects.  At her visit 02/2013 she had had change of insurance and had problems with following up with psychiatry because of the insurance change. However since then she has been seeing psychiatry again and they are managing her bipolar disorder and her psych medications.   Review of Systems: Consitutional: No fever, chills, fatigue, night sweats, lymphadenopathy. No significant/unexplained weight changes. Eyes: No visual changes, eye redness, or discharge. ENT/Mouth: No ear pain, sore throat, nasal drainage, or sinus pain. Cardiovascular: No chest pressure,heaviness, tightness or squeezing, even with exertion. No increased shortness of breath or dyspnea on exertion.No palpitations, edema, orthopnea, PND. Respiratory: No cough, hemoptysis, SOB, or wheezing. Gastrointestinal: No anorexia, dysphagia, reflux, pain, nausea, vomiting, hematemesis, diarrhea, constipation, BRBPR, or melena. Breast: No mass, nodules, bulging, or retraction. No skin changes or inflammation. No nipple discharge. No lymphadenopathy. Genitourinary: No dysuria, hematuria, incontinence, vaginal discharge, pruritis, burning,  abnormal bleeding, or pain. Musculoskeletal: No decreased ROM, No joint pain or swelling. No significant pain in neck, back, or extremities. Skin: No rash, pruritis, or concerning lesions. Neurological: No headache, dizziness, syncope, seizures, tremors, memory loss, coordination problems, or paresthesias. Endocrine: No polydipsia, polyphagia, polyuria, or known diabetes.No increased fatigue. No palpitations/rapid heart rate. No significant/unexplained weight change. All other systems were reviewed and are otherwise negative.  Past Medical History  Diagnosis Date  . RLS (restless legs syndrome)   . Bipolar disorder   . COPD (chronic obstructive pulmonary disease)   . Hyperlipidemia   . Elevated liver function tests 11/26/2004    H/O NEGATIVE LIVER BIOPSY, workup negative  . Restless legs syndrome (RLS)      Past Surgical History  Procedure Laterality Date  . Abdominal hysterectomy    . Oophorectomy    . Cesarean section    . Liver biopsy  2005    PerPt: Done to eval elevated LFTs and biopsy provided no definite dx/cause of elevated LFTs    Home Meds:  Current Outpatient Prescriptions on File Prior to Visit  Medication Sig Dispense Refill  . alendronate (FOSAMAX) 70 MG tablet Take 1 tablet (70 mg total) by mouth once a week. Take with a full glass of water on an empty stomach.  12 tablet  3  . buPROPion (WELLBUTRIN XL) 150 MG 24 hr tablet Take 150 mg by mouth daily.      Marland Kitchen buPROPion (WELLBUTRIN XL) 300 MG 24 hr tablet Take 300 mg by mouth daily.      . Cholecalciferol (VITAMIN D-3) 5000 UNITS TABS Take 1 tablet by mouth daily.      . clonazePAM (KLONOPIN) 1 MG tablet Take 1 mg by mouth 3 (three) times daily.      Marland Kitchen  colesevelam (WELCHOL) 625 MG tablet Take 3 tablets (1,875 mg total) by mouth 2 (two) times daily with a meal.  540 tablet  0  . cyclobenzaprine (FLEXERIL) 10 MG tablet Take 10 mg by mouth 3 (three) times daily as needed for muscle spasms.      Marland Kitchen etodolac (LODINE) 400 MG  tablet Take 1 tablet by mouth 2 (two) times daily with a meal.      . rosuvastatin (CRESTOR) 10 MG tablet Take 1 tablet (10 mg total) by mouth daily.  90 tablet  0   No current facility-administered medications on file prior to visit.    Allergies:  Allergies  Allergen Reactions  . Benadryl [Diphenhydramine Hcl]     Hives/ rapid heartrate  . Vicodin [Hydrocodone-Acetaminophen] Other (See Comments)    dizziness    History   Social History  . Marital Status: Married    Spouse Name: N/A    Number of Children: N/A  . Years of Education: N/A   Occupational History  . Not on file.   Social History Main Topics  . Smoking status: Never Smoker   . Smokeless tobacco: Never Used  . Alcohol Use: No  . Drug Use: Yes    Special: Marijuana  . Sexual Activity: Yes    Birth Control/ Protection: Surgical   Other Topics Concern  . Not on file   Social History Narrative  . No narrative on file    Family History  Problem Relation Age of Onset  . COPD Mother   . Cancer Father     Lung CA, Skin Cancer--?type  . COPD Father   . Stroke Father   . Cancer Sister 78    cervical cancer  . COPD Brother   . Alcohol abuse Brother   . Stroke Brother     Physical Exam: Blood pressure 126/86, pulse 68, temperature 97.4 F (36.3 C), temperature source Oral, resp. rate 18, weight 160 lb (72.576 kg)., Body mass index is 29.26 kg/(m^2). General: Well developed, well nourished, WF. Appears in no acute distress. Neck: Supple. Trachea midline. No thyromegaly. Full ROM. No lymphadenopathy.No Carotid Bruits. Lungs: Clear to auscultation bilaterally without wheezes, rales, or rhonchi. Breathing is of normal effort and unlabored. Cardiovascular: RRR with S1 S2. No murmurs, rubs, or gallops. Distal pulses 2+ symmetrically. No carotid or abdominal bruits. Abdomen: Soft, non-tender, non-distended with normoactive bowel sounds. No hepatosplenomegaly or masses. No rebound/guarding. No CVA tenderness. No  hernias.  Musculoskeletal: Full range of motion and 5/5 strength throughout.  Skin: Warm and moist without erythema, ecchymosis, wounds, or rash. Neuro: A+Ox3. CN II-XII grossly intact. Moves all extremities spontaneously. Psych:  Responds to questions appropriately.    Assessment/Plan:  48 y.o. y/o female here for routine f/u of:  1. Hyperlipidemia  On Crestor and Welchol.  She is fasting. Check FLP and CMP now.  2. Elevated liver function tests  H/O complete work up including liver biopsy which was all normal/negative. LFTs stable on statin. 58 CME T. now.   3. COPD (chronic obstructive pulmonary disease)   4. Bipolar disorder, unspecified  Per Psychiatry     5. preventive health examination--she had CPE with me 11/22/2013. The following is copied from that visit.:  A. Screening Labs: She provides printed out copies of lab results to Dr. Brunetta Genera had done at his office. She has results from 09/27/13, 06/28/13, 03/28/13. I have kept a copy of the labs dated 09/27/13 and will have these scanned into the computer system. The following  labs were included: CBC, CMET, TSH, Vit D, FLP CBC was completely normal.  CMET completely normal-- except Alk Phos 246 / Alt 49 (she has history of known elevated LFTs and has undergone an evaluation of this including liver biopsy. She reports that the liver biopsy                                                                                                         revealed no diagnoses/definitive reason for the elevated LFTs) TSH, T4, T3, all normal. Vitamin D low at 19.5 Total cholesterol 159  Triglyceride 135 HDL 53 LDL 79   B. Pap: She reports that she had complete hysterectomy with bilateral oophorectomy at age 53 secondary to both ovarian and uterine cancer. She states that she hasn't had a Pap smear in over 10 years. Because her hysterectomy was done secondary to a cancer we do need to do Pap smear. Pap smear obtained today and sent.  C.  Screening Mammogram: She states she has had a mammogram. States that actually did the referral for her approximately one year ago and she had this done in [place] and it was normal. After her visit, I was able to go back to her paper chart. Last mammogram was 06/16/2012. Negative. Given her history of uterine and ovarian cancer and her age of 38, should  go ahead and be getting annual mammograms. I will go ahead and order followup mammogram.  D. DEXA/BMD: She brought in a printed copy of DEXA/BMD results which were done by Dr. Brunetta Genera dated 04/20/13. T score of the hip was -1.9.  T score of the third poiesis spine was -2.04.  She is now on Fosamax. It states that the Fosamax was started right after obtaining the above DEXA reports. States that that was her first and only DEXA/PND she has had.  E. Colorectal Cancer Screening: She reports that she had this been endoscopy and colonoscopy in approximately 2004 2005. Says it has revealed polyps but with no cancer. This was performed at the [facility] clinic. This was done by the same GI doctor who did her liver biopsy. After her visit, I reviewed her paper chart and can find no documentation of her prior colonoscopy. I will go ahead and refer her back to the [facility] .  F. Immunizations:  Tetanus: Patient reports that her last tetanus was over 10 years ago. She is agreeable to update this today.    - PAP, Thin Prep w/HPV rflx HPV Type 16/18  2. Need for Tdap vaccination - Tdap vaccine greater than or equal to 7yo IM   Signed, [address] [place], [place], [facility] 05/23/2014 9:43 AM

## 2014-06-26 ENCOUNTER — Other Ambulatory Visit: Payer: Self-pay | Admitting: Physician Assistant

## 2014-06-26 NOTE — Telephone Encounter (Signed)
Refill appropriate and filled per protocol. 

## 2014-07-11 ENCOUNTER — Encounter: Payer: Self-pay | Admitting: *Deleted

## 2014-07-11 ENCOUNTER — Encounter: Payer: Self-pay | Admitting: Physician Assistant

## 2014-07-11 ENCOUNTER — Ambulatory Visit (INDEPENDENT_AMBULATORY_CARE_PROVIDER_SITE_OTHER): Payer: 59 | Admitting: Physician Assistant

## 2014-07-11 VITALS — BP 116/70 | HR 72 | Temp 97.4°F | Ht 62.0 in | Wt 159.0 lb

## 2014-07-11 DIAGNOSIS — G43809 Other migraine, not intractable, without status migrainosus: Secondary | ICD-10-CM

## 2014-07-11 DIAGNOSIS — R252 Cramp and spasm: Secondary | ICD-10-CM

## 2014-07-11 DIAGNOSIS — Z23 Encounter for immunization: Secondary | ICD-10-CM

## 2014-07-11 LAB — COMPLETE METABOLIC PANEL WITH GFR
ALT: 29 U/L (ref 0–35)
AST: 25 U/L (ref 0–37)
Albumin: 4.5 g/dL (ref 3.5–5.2)
Alkaline Phosphatase: 118 U/L — ABNORMAL HIGH (ref 39–117)
BUN: 9 mg/dL (ref 6–23)
CALCIUM: 9.5 mg/dL (ref 8.4–10.5)
CO2: 26 meq/L (ref 19–32)
CREATININE: 0.76 mg/dL (ref 0.50–1.10)
Chloride: 103 mEq/L (ref 96–112)
GFR, Est African American: >89 mL/min
GLUCOSE: 112 mg/dL — AB (ref 70–99)
Potassium: 4 mEq/L (ref 3.5–5.3)
Sodium: 139 mEq/L (ref 135–145)
Total Bilirubin: 0.4 mg/dL (ref 0.2–1.2)
Total Protein: 6.7 g/dL (ref 6.0–8.3)

## 2014-07-11 LAB — CBC WITH DIFFERENTIAL/PLATELET
BASOS PCT: 0 % (ref 0–1)
Basophils Absolute: 0 10*3/uL (ref 0.0–0.1)
EOS ABS: 0.1 10*3/uL (ref 0.0–0.7)
Eosinophils Relative: 2 % (ref 0–5)
HCT: 36.8 % (ref 36.0–46.0)
Hemoglobin: 12.6 g/dL (ref 12.0–15.0)
Lymphocytes Relative: 37 % (ref 12–46)
Lymphs Abs: 2.3 10*3/uL (ref 0.7–4.0)
MCH: 29.8 pg (ref 26.0–34.0)
MCHC: 34.2 g/dL (ref 30.0–36.0)
MCV: 87 fL (ref 78.0–100.0)
Monocytes Absolute: 0.3 10*3/uL (ref 0.1–1.0)
Monocytes Relative: 5 % (ref 3–12)
NEUTROS ABS: 3.5 10*3/uL (ref 1.7–7.7)
Neutrophils Relative %: 56 % (ref 43–77)
PLATELETS: 204 10*3/uL (ref 150–400)
RBC: 4.23 MIL/uL (ref 3.87–5.11)
RDW: 13.6 % (ref 11.5–15.5)
WBC: 6.3 10*3/uL (ref 4.0–10.5)

## 2014-07-11 LAB — CK: Total CK: 187 U/L — ABNORMAL HIGH (ref 7–177)

## 2014-07-11 LAB — TSH: TSH: 0.822 u[IU]/mL (ref 0.350–4.500)

## 2014-07-11 MED ORDER — SUMATRIPTAN SUCCINATE 100 MG PO TABS: ORAL_TABLET | ORAL | Status: DC

## 2014-07-11 NOTE — Progress Notes (Signed)
Patient ID: Stacy Chambers MRN: [id number], DOB: February 07, 1966, 48 y.o. Date of Encounter: @DATE @  Chief Complaint:  Chief Complaint  Patient presents with  . Eye Problem    per patient-  bright light gives her headaches.    HPI: 48 y.o. year old female  presents with above complaints.  Says that she has been noticing that she is around bright light for a long time or really bright light that it sometimes causes a headache right behind her left eye. Says that she recently saw her eye doctor and they said that it was not a problem with her eye causing this. Says that her 2 sisters both have migraine headaches. Says in addition to seeing an eye doctor that she also had accidentally poked her eye with a clothes hanger recently and had to go to ER. Says that they also did a brain scan at that time.   Also says that she has been getting cramps in different muscles over the past 2-3 months. Says that on average it happens about once a week. Sometimes it happens in her hand and her hand will draw up and cramp/spasm. Yesterday morning it was her calf - it felt like a charley horse. Says they have also occurred in her thigh. States that she has no myalgias-- just these muscle cramps.  Also wants to get a flu shot today. She is going to be keeping her grandchild some days of the week.    Past Medical History  Diagnosis Date  . RLS (restless legs syndrome)   . Bipolar disorder   . COPD (chronic obstructive pulmonary disease)   . Hyperlipidemia   . Elevated liver function tests 11/26/2004    H/O NEGATIVE LIVER BIOPSY, workup negative  . Restless legs syndrome (RLS)      Home Meds: Outpatient Prescriptions Prior to Visit  Medication Sig Dispense Refill  . clonazePAM (KLONOPIN) 1 MG tablet Take 1 mg by mouth 3 (three) times daily.      . colesevelam (WELCHOL) 625 MG tablet Take 3 tablets (1,875 mg total) by mouth 2 (two) times daily with a meal.  540 tablet  0  . CRESTOR 10 MG tablet TAKE 1  TABLET EVERY DAY  90 tablet  0  . ziprasidone (GEODON) 40 MG capsule Take 1 capsule by mouth at bedtime.      Marland Kitchen zolpidem (AMBIEN) 10 MG tablet Take 1 tablet by mouth at bedtime as needed.      Marland Kitchen alendronate (FOSAMAX) 70 MG tablet Take 1 tablet (70 mg total) by mouth once a week. Take with a full glass of water on an empty stomach.  12 tablet  3  . buPROPion (WELLBUTRIN XL) 150 MG 24 hr tablet Take 150 mg by mouth daily.      Marland Kitchen buPROPion (WELLBUTRIN XL) 300 MG 24 hr tablet Take 300 mg by mouth daily.      . Cholecalciferol (VITAMIN D-3) 5000 UNITS TABS Take 1 tablet by mouth daily.      . cyclobenzaprine (FLEXERIL) 10 MG tablet Take 10 mg by mouth 3 (three) times daily as needed for muscle spasms.      Marland Kitchen etodolac (LODINE) 400 MG tablet Take 1 tablet by mouth 2 (two) times daily with a meal.       No facility-administered medications prior to visit.    Allergies:  Allergies  Allergen Reactions  . Benadryl [Diphenhydramine Hcl]     Hives/ rapid heartrate  . Vicodin [Hydrocodone-Acetaminophen] Other (  See Comments)    dizziness    History   Social History  . Marital Status: Married    Spouse Name: N/A    Number of Children: N/A  . Years of Education: N/A   Occupational History  . Not on file.   Social History Main Topics  . Smoking status: Never Smoker   . Smokeless tobacco: Never Used  . Alcohol Use: No  . Drug Use: Yes    Special: Marijuana  . Sexual Activity: Yes    Birth Control/ Protection: Surgical   Other Topics Concern  . Not on file   Social History Narrative  . No narrative on file    Family History  Problem Relation Age of Onset  . COPD Mother   . Cancer Father     Lung CA, Skin Cancer--?type  . COPD Father   . Stroke Father   . Cancer Sister 32    cervical cancer  . COPD Brother   . Alcohol abuse Brother   . Stroke Brother      Review of Systems:  See HPI for pertinent ROS. All other ROS negative.    Physical Exam: Blood pressure 116/70, pulse  72, temperature 97.4 F (36.3 C), temperature source Oral, height 5\' 2"  (1.575 m), weight 159 lb (72.122 kg)., Body mass index is 29.07 kg/(m^2). General: WF. Appears in no acute distress. Head: Normocephalic, atraumatic, eyes without discharge, sclera non-icteric, nares are without discharge. Bilateral auditory canals clear, TM's are without perforation, pearly grey and translucent with reflective cone of light bilaterally. Oral cavity moist, posterior pharynx without exudate, erythema, peritonsillar abscess, or post nasal drip.  Neck: Supple. No thyromegaly. No lymphadenopathy. Lungs: Clear bilaterally to auscultation without wheezes, rales, or rhonchi. Breathing is unlabored. Heart: RRR with S1 S2. No murmurs, rubs, or gallops. Musculoskeletal:  Strength and tone normal for age. Extremities/Skin: Warm and dry. Neuro: Alert and oriented X 3. Moves all extremities spontaneously. Gait is normal. CNII-XII grossly in tact.Pupils equal, reactive to light.  Psych:  Responds to questions appropriately with a normal affect.     ASSESSMENT AND PLAN:  48 y.o. year old female with  1. Other type of migraine - SUMAtriptan (IMITREX) 100 MG tablet; Take 1 at onset of migraine. If symptoms persist in 2 hours, may take a 2nd dose. Max 2 per 24 hours.  Dispense: 10 tablet; Refill: 0 As well she says that her migraine will usually resolve with over-the-counter ibuprofen.  2. Muscle cramps Will check labs to evaluate for underlying cause. Told her to hold Crestor for 2 weeks to see whether the cramps resolve with holding this medication. If labs are normal and holding the Crestor does not cause spasms to resolve, then recommend she f/u with her psychiatrist to see whether the psych meds may be contributing to this. - COMPLETE METABOLIC PANEL WITH GFR - CBC with Differential - TSH - CK  3. give flu shot today.  Signed, [address] [place], [place], [facility] 07/11/2014 10:02 AM

## 2014-07-11 NOTE — Addendum Note (Signed)
Addended by: Roland Rack on: 07/11/2014 11:00 AM   Modules accepted: Orders

## 2014-07-13 NOTE — Progress Notes (Signed)
Called patient-  Advised of normal labs.

## 2014-07-17 ENCOUNTER — Telehealth: Payer: Self-pay | Admitting: Physician Assistant

## 2014-07-17 NOTE — Telephone Encounter (Signed)
Patient says the medication that mary beth prescribed is giving her an upset stomach and would like to know if there is something else we can call in for her  [phone] Dorena Dew

## 2014-07-18 MED ORDER — ACETAMINOPHEN-CODEINE #3 300-30 MG PO TABS
1.0000 | ORAL_TABLET | ORAL | Status: DC | PRN
Start: 2014-07-18 — End: 2014-10-30

## 2014-07-18 NOTE — Telephone Encounter (Signed)
Called patient, She says Imitrex given to for HA's is making her sick.  I then told her that we had received a notice from her insurance that it was contraindicated with use of Sertraline.  They have on record her receiving this from another provider and was not her medication list.  She confirmed she was indeed taking this.  Told her of contraindication and told to stop using.  Provider to order Tylenol #3 for prn use for HA's.    Pt knows she need to come to [facility] and pick up Rx.  Medication list updated.

## 2014-08-01 ENCOUNTER — Other Ambulatory Visit: Payer: Self-pay | Admitting: Physician Assistant

## 2014-08-02 NOTE — Telephone Encounter (Signed)
Refill appropriate and filled per protocol. 

## 2014-08-23 ENCOUNTER — Other Ambulatory Visit: Payer: Self-pay | Admitting: Physician Assistant

## 2014-08-27 ENCOUNTER — Encounter: Payer: Self-pay | Admitting: Physician Assistant

## 2014-10-05 ENCOUNTER — Encounter: Payer: Self-pay | Admitting: Physician Assistant

## 2014-10-08 ENCOUNTER — Other Ambulatory Visit: Payer: 59

## 2014-10-08 DIAGNOSIS — J449 Chronic obstructive pulmonary disease, unspecified: Secondary | ICD-10-CM

## 2014-10-08 DIAGNOSIS — Z79899 Other long term (current) drug therapy: Secondary | ICD-10-CM

## 2014-10-08 DIAGNOSIS — E785 Hyperlipidemia, unspecified: Secondary | ICD-10-CM

## 2014-10-08 DIAGNOSIS — Z0000 Encounter for general adult medical examination without abnormal findings: Secondary | ICD-10-CM

## 2014-10-08 LAB — COMPLETE METABOLIC PANEL WITH GFR
ALBUMIN: 4.6 g/dL (ref 3.5–5.2)
ALT: 19 U/L (ref 0–35)
AST: 20 U/L (ref 0–37)
Alkaline Phosphatase: 147 U/L — ABNORMAL HIGH (ref 39–117)
BUN: 12 mg/dL (ref 6–23)
CO2: 27 mEq/L (ref 19–32)
Calcium: 10.1 mg/dL (ref 8.4–10.5)
Chloride: 106 mEq/L (ref 96–112)
Creat: 0.8 mg/dL (ref 0.50–1.10)
GFR, EST NON AFRICAN AMERICAN: 87 mL/min
GFR, Est African American: >89 mL/min
Glucose, Bld: 96 mg/dL (ref 70–99)
Potassium: 4.4 mEq/L (ref 3.5–5.3)
SODIUM: 140 meq/L (ref 135–145)
Total Bilirubin: 0.5 mg/dL (ref 0.2–1.2)
Total Protein: 6.9 g/dL (ref 6.0–8.3)

## 2014-10-08 LAB — LIPID PANEL
Cholesterol: 144 mg/dL (ref 0–200)
HDL: 59 mg/dL (ref >39)
LDL Cholesterol: 62 mg/dL (ref 0–99)
Total CHOL/HDL Ratio: 2.4 Ratio
Triglycerides: 115 mg/dL (ref <150)
VLDL: 23 mg/dL (ref 0–40)

## 2014-10-08 LAB — CBC WITH DIFFERENTIAL/PLATELET
BASOS ABS: 0 10*3/uL (ref 0.0–0.1)
BASOS PCT: 0 % (ref 0–1)
Eosinophils Absolute: 0.1 10*3/uL (ref 0.0–0.7)
Eosinophils Relative: 1 % (ref 0–5)
HCT: 38.7 % (ref 36.0–46.0)
HEMOGLOBIN: 12.8 g/dL (ref 12.0–15.0)
LYMPHS PCT: 34 % (ref 12–46)
Lymphs Abs: 1.9 10*3/uL (ref 0.7–4.0)
MCH: 29.4 pg (ref 26.0–34.0)
MCHC: 33.1 g/dL (ref 30.0–36.0)
MCV: 88.8 fL (ref 78.0–100.0)
MPV: 10.3 fL (ref 9.4–12.4)
Monocytes Absolute: 0.3 10*3/uL (ref 0.1–1.0)
Monocytes Relative: 5 % (ref 3–12)
Neutro Abs: 3.4 10*3/uL (ref 1.7–7.7)
Neutrophils Relative %: 60 % (ref 43–77)
PLATELETS: 192 10*3/uL (ref 150–400)
RBC: 4.36 MIL/uL (ref 3.87–5.11)
RDW: 13.3 % (ref 11.5–15.5)
WBC: 5.7 10*3/uL (ref 4.0–10.5)

## 2014-10-08 LAB — TSH: TSH: 1.514 u[IU]/mL (ref 0.350–4.500)

## 2014-10-10 ENCOUNTER — Other Ambulatory Visit: Payer: Self-pay | Admitting: Physician Assistant

## 2014-10-10 MED ORDER — COLESEVELAM HCL 625 MG PO TABS: 1875.0000 mg | ORAL_TABLET | Freq: Two times a day (BID) | ORAL | Status: DC

## 2014-10-15 ENCOUNTER — Telehealth: Payer: Self-pay | Admitting: Physician Assistant

## 2014-10-15 MED ORDER — MELOXICAM 7.5 MG PO TABS: 7.5000 mg | ORAL_TABLET | Freq: Every day | ORAL | Status: DC

## 2014-10-15 NOTE — Telephone Encounter (Signed)
Not taking Lodine.  Pain mostly in hands, feet and lower spine.  Tried OTC med (Acetaminophen) no relief.

## 2014-10-15 NOTE — Telephone Encounter (Signed)
Pt aware. RX to pharmacy

## 2014-10-15 NOTE — Telephone Encounter (Signed)
Has appt to come see you 11/22/14.

## 2014-10-15 NOTE — Telephone Encounter (Signed)
[phone]  Pt is wanting something called in for her arthritis

## 2014-10-15 NOTE — Telephone Encounter (Signed)
(  FYI: Kidney function nml at last lab. )  Mobic 7.5mg  one po  QD with food --prn # 30 + 2.

## 2014-10-25 ENCOUNTER — Other Ambulatory Visit: Payer: Self-pay | Admitting: Physician Assistant

## 2014-10-29 ENCOUNTER — Encounter (HOSPITAL_COMMUNITY): Payer: Self-pay | Admitting: *Deleted

## 2014-10-29 ENCOUNTER — Emergency Department (HOSPITAL_COMMUNITY)
Admission: EM | Admit: 2014-10-29 | Discharge: 2014-10-29 | Discharge status: Against Medical Advice | Payer: Medicare FFS | Attending: Emergency Medicine | Admitting: Emergency Medicine

## 2014-10-29 DIAGNOSIS — R109 Unspecified abdominal pain: Secondary | ICD-10-CM | POA: Insufficient documentation

## 2014-10-29 NOTE — ED Notes (Signed)
Pt c/o epigastric pain with n/v that started today; pt states she doesn't know if it has to do with her not taking her klonipin since before Christmas because she ran out

## 2014-10-29 NOTE — ED Notes (Signed)
No answer in all waiting areas 

## 2014-10-30 ENCOUNTER — Ambulatory Visit (INDEPENDENT_AMBULATORY_CARE_PROVIDER_SITE_OTHER): Payer: 59 | Admitting: Family Medicine

## 2014-10-30 ENCOUNTER — Encounter: Payer: Self-pay | Admitting: Family Medicine

## 2014-10-30 VITALS — BP 110/62 | HR 68 | Temp 98.5°F | Resp 14 | Ht 62.0 in | Wt 144.0 lb

## 2014-10-30 DIAGNOSIS — F13239 Sedative, hypnotic or anxiolytic dependence with withdrawal, unspecified: Secondary | ICD-10-CM | POA: Insufficient documentation

## 2014-10-30 DIAGNOSIS — F13930 Sedative, hypnotic or anxiolytic use, unspecified with withdrawal, uncomplicated: Secondary | ICD-10-CM

## 2014-10-30 DIAGNOSIS — F13939 Sedative, hypnotic or anxiolytic use, unspecified with withdrawal, unspecified: Secondary | ICD-10-CM | POA: Insufficient documentation

## 2014-10-30 DIAGNOSIS — R0789 Other chest pain: Secondary | ICD-10-CM

## 2014-10-30 DIAGNOSIS — F1320 Sedative, hypnotic or anxiolytic dependence, uncomplicated: Secondary | ICD-10-CM | POA: Insufficient documentation

## 2014-10-30 DIAGNOSIS — F13230 Sedative, hypnotic or anxiolytic dependence with withdrawal, uncomplicated: Secondary | ICD-10-CM

## 2014-10-30 DIAGNOSIS — F310 Bipolar disorder, current episode hypomanic: Secondary | ICD-10-CM

## 2014-10-30 DIAGNOSIS — R079 Chest pain, unspecified: Secondary | ICD-10-CM | POA: Insufficient documentation

## 2014-10-30 LAB — COMPREHENSIVE METABOLIC PANEL
ALT: 16 U/L (ref 0–35)
AST: 18 U/L (ref 0–37)
Albumin: 4.7 g/dL (ref 3.5–5.2)
Alkaline Phosphatase: 112 U/L (ref 39–117)
BUN: 7 mg/dL (ref 6–23)
CO2: 27 mEq/L (ref 19–32)
Calcium: 9.5 mg/dL (ref 8.4–10.5)
Chloride: 100 mEq/L (ref 96–112)
Creat: 0.7 mg/dL (ref 0.50–1.10)
Glucose, Bld: 90 mg/dL (ref 70–99)
POTASSIUM: 4 meq/L (ref 3.5–5.3)
Sodium: 138 mEq/L (ref 135–145)
TOTAL PROTEIN: 6.8 g/dL (ref 6.0–8.3)
Total Bilirubin: 0.5 mg/dL (ref 0.2–1.2)

## 2014-10-30 LAB — CBC WITH DIFFERENTIAL/PLATELET
Basophils Absolute: 0 10*3/uL (ref 0.0–0.1)
Basophils Relative: 0 % (ref 0–1)
EOS ABS: 0.1 10*3/uL (ref 0.0–0.7)
Eosinophils Relative: 1 % (ref 0–5)
HEMATOCRIT: 38.8 % (ref 36.0–46.0)
Hemoglobin: 13.3 g/dL (ref 12.0–15.0)
LYMPHS PCT: 34 % (ref 12–46)
Lymphs Abs: 2.8 10*3/uL (ref 0.7–4.0)
MCH: 29.4 pg (ref 26.0–34.0)
MCHC: 34.3 g/dL (ref 30.0–36.0)
MCV: 85.7 fL (ref 78.0–100.0)
MPV: 9.6 fL (ref 8.6–12.4)
Monocytes Absolute: 0.5 10*3/uL (ref 0.1–1.0)
Monocytes Relative: 6 % (ref 3–12)
NEUTROS PCT: 59 % (ref 43–77)
Neutro Abs: 4.8 10*3/uL (ref 1.7–7.7)
Platelets: 231 10*3/uL (ref 150–400)
RBC: 4.53 MIL/uL (ref 3.87–5.11)
RDW: 13.7 % (ref 11.5–15.5)
WBC: 8.2 10*3/uL (ref 4.0–10.5)

## 2014-10-30 MED ORDER — CLONAZEPAM 1 MG PO TABS: 1.0000 mg | ORAL_TABLET | Freq: Three times a day (TID) | ORAL | Status: DC | PRN

## 2014-10-30 NOTE — Patient Instructions (Addendum)
Make sure you see your psychiatrist Klonopin 60 tablets given only for this month We will call with lab results F/U as previous

## 2014-10-30 NOTE — Assessment & Plan Note (Signed)
Continue meds, f/u with psych Restart benzo at 1 po BID prn,

## 2014-10-30 NOTE — Assessment & Plan Note (Signed)
EKG resassuring, repeat labs  Doubt cardiac in nature Likley due to benzo withdrawel and anxiety, given 60 tabs to hold her through until her appt on the 29th

## 2014-10-30 NOTE — Progress Notes (Signed)
Patient ID: Stacy Chambers, female   DOB: Apr 10, 1966, 49 y.o.   MRN: [id number]   Subjective:    Patient ID: Stacy Chambers, female    DOB: 1965/11/07, 49 y.o.   MRN: [id number]  Patient presents for Dizziness; Nausea; Possible Withdrawal; and Heart Flutters  Patient here with multiple symptoms. It boils down to her being off of her benzodiazepine for the past 2 weeks. She states that her psychiatrist no refill to Klonopin and she's been on one tablet 3 times a day. About a week and a half ago she began having nausea and upset stomach as well as some chest fluttering she then began having abdominal pain but no change in her bowels. She actually went to the emergency room yesterday they did an EKG seems like she was evaluated by the nurse however she was put back in the waiting room and her EKG was normal at that time. She then became upset states that she was dizzy and not feeling well and therefore left the emergency room. She's tried contacting her psychiatrist and they have assigned her to a new one on the 29th. I reviewed the database shows Klonopin last filled on 10/29 by Dr. Annitta Jersey   Review Of Systems:  GEN- denies fatigue, fever, weight loss,weakness, recent illness HEENT- denies eye drainage, change in vision, nasal discharge, CVS- denies chest pain, +palpitations RESP- denies SOB, cough, wheeze ABD- denies N/V, change in stools, +abd pain GU- denies dysuria, hematuria, dribbling, incontinence MSK- denies joint pain, muscle aches, injury Neuro- denies headache, dizziness, syncope, seizure activity       Objective:    BP 110/62 mmHg  Pulse 68  Temp(Src) 98.5 F (36.9 C) (Oral)  Resp 14  Ht 5\' 2"  (1.575 m)  Wt 144 lb (65.318 kg)  BMI 26.33 kg/m2 GEN- NAD, alert and oriented x3 HEENT- PERRL, EOMI, non injected sclera, pink conjunctiva, MMM, oropharynx clear Neck- Supple, no thyromegaly CVS- RRR, no murmur RESP-CTAB ABD-NABS,soft,NT,ND Psych- anxious appearing, not depressed,  normal eye contact, normal speech, very tangential EXT- No edema Pulses- Radial 2+   EKG-NSR, compared to yesterday unchanged     Assessment & Plan:      Problem List Items Addressed This Visit      Unprioritized   Bipolar disorder   Benzodiazepine withdrawal   Atypical chest pain - Primary    EKG resassuring, repeat labs  Doubt cardiac in nature Likley due to benzo withdrawel and anxiety, given 60 tabs to hold her through until her appt on the 29th    Relevant Orders      Comprehensive metabolic panel      CBC with Differential      EKG 12-Lead (Completed)      Note: This dictation was prepared with Dragon dictation along with smaller phrase technology. Any transcriptional errors that result from this process are unintentional.

## 2014-11-22 ENCOUNTER — Ambulatory Visit: Payer: 59 | Admitting: Physician Assistant

## 2014-12-07 ENCOUNTER — Other Ambulatory Visit: Payer: Self-pay | Admitting: Physician Assistant

## 2014-12-07 NOTE — Telephone Encounter (Signed)
Medication refilled per protocol. 

## 2014-12-20 ENCOUNTER — Telehealth: Payer: Self-pay | Admitting: *Deleted

## 2014-12-20 NOTE — Telephone Encounter (Signed)
Pt called stating that [facility] had called her stating she needs a referral submitted by PCP for them to pay for her appointment on 12/18/14 at her psychiatrist at [facility] [phone], fax [phone], I went online thru [facility] and did not bring pt up as did not recognize, I hand wrote to [employer] and faxed to them for referral authorization.

## 2014-12-27 ENCOUNTER — Other Ambulatory Visit: Payer: Self-pay | Admitting: Physician Assistant

## 2014-12-27 NOTE — Telephone Encounter (Signed)
Refill appropriate and filled per protocol. 

## 2014-12-28 NOTE — Telephone Encounter (Signed)
Received email back from [employer] stating they do not retro referrals, needs to be no later than date of service in order to authorize.

## 2015-02-21 DIAGNOSIS — G4700 Insomnia, unspecified: Secondary | ICD-10-CM

## 2015-02-21 DIAGNOSIS — G2581 Restless legs syndrome: Secondary | ICD-10-CM | POA: Insufficient documentation

## 2015-02-21 DIAGNOSIS — K8020 Calculus of gallbladder without cholecystitis without obstruction: Secondary | ICD-10-CM | POA: Insufficient documentation

## 2015-02-21 DIAGNOSIS — F5105 Insomnia due to other mental disorder: Secondary | ICD-10-CM | POA: Insufficient documentation

## 2015-02-21 DIAGNOSIS — F411 Generalized anxiety disorder: Secondary | ICD-10-CM | POA: Insufficient documentation

## 2015-02-21 DIAGNOSIS — F312 Bipolar disorder, current episode manic severe with psychotic features: Secondary | ICD-10-CM | POA: Insufficient documentation

## 2015-02-21 DIAGNOSIS — F319 Bipolar disorder, unspecified: Secondary | ICD-10-CM | POA: Insufficient documentation

## 2015-02-21 HISTORY — DX: Calculus of gallbladder without cholecystitis without obstruction: K80.20

## 2015-02-21 HISTORY — DX: Generalized anxiety disorder: F41.1

## 2015-02-25 ENCOUNTER — Other Ambulatory Visit: Payer: Self-pay | Admitting: Physician Assistant

## 2015-02-26 NOTE — Telephone Encounter (Signed)
Refill appropriate and filled per protocol. 

## 2015-03-04 ENCOUNTER — Other Ambulatory Visit: Payer: Medicare PPO

## 2015-03-04 ENCOUNTER — Encounter: Payer: Self-pay | Admitting: Physician Assistant

## 2015-03-04 ENCOUNTER — Ambulatory Visit (INDEPENDENT_AMBULATORY_CARE_PROVIDER_SITE_OTHER): Payer: Medicare PPO | Admitting: Physician Assistant

## 2015-03-04 VITALS — BP 104/66 | HR 60 | Temp 98.3°F | Resp 18 | Wt 144.0 lb

## 2015-03-04 DIAGNOSIS — M810 Age-related osteoporosis without current pathological fracture: Secondary | ICD-10-CM | POA: Insufficient documentation

## 2015-03-04 DIAGNOSIS — R7989 Other specified abnormal findings of blood chemistry: Secondary | ICD-10-CM | POA: Diagnosis not present

## 2015-03-04 DIAGNOSIS — R945 Abnormal results of liver function studies: Secondary | ICD-10-CM

## 2015-03-04 DIAGNOSIS — F319 Bipolar disorder, unspecified: Secondary | ICD-10-CM

## 2015-03-04 DIAGNOSIS — E785 Hyperlipidemia, unspecified: Secondary | ICD-10-CM

## 2015-03-04 DIAGNOSIS — N63 Unspecified lump in breast: Secondary | ICD-10-CM | POA: Diagnosis not present

## 2015-03-04 DIAGNOSIS — N6320 Unspecified lump in the left breast, unspecified quadrant: Secondary | ICD-10-CM

## 2015-03-04 DIAGNOSIS — Z79899 Other long term (current) drug therapy: Secondary | ICD-10-CM

## 2015-03-04 LAB — CBC WITH DIFFERENTIAL/PLATELET
Basophils Absolute: 0 10*3/uL (ref 0.0–0.1)
Basophils Relative: 0 % (ref 0–1)
EOS PCT: 2 % (ref 0–5)
Eosinophils Absolute: 0.1 10*3/uL (ref 0.0–0.7)
HCT: 38.7 % (ref 36.0–46.0)
HEMOGLOBIN: 12.9 g/dL (ref 12.0–15.0)
Lymphocytes Relative: 36 % (ref 12–46)
Lymphs Abs: 1.9 10*3/uL (ref 0.7–4.0)
MCH: 29.5 pg (ref 26.0–34.0)
MCHC: 33.3 g/dL (ref 30.0–36.0)
MCV: 88.4 fL (ref 78.0–100.0)
MPV: 9.7 fL (ref 8.6–12.4)
Monocytes Absolute: 0.3 10*3/uL (ref 0.1–1.0)
Monocytes Relative: 6 % (ref 3–12)
NEUTROS PCT: 56 % (ref 43–77)
Neutro Abs: 3 10*3/uL (ref 1.7–7.7)
Platelets: 179 10*3/uL (ref 150–400)
RBC: 4.38 MIL/uL (ref 3.87–5.11)
RDW: 13.7 % (ref 11.5–15.5)
WBC: 5.4 10*3/uL (ref 4.0–10.5)

## 2015-03-04 LAB — LIPID PANEL
CHOLESTEROL: 162 mg/dL (ref 0–200)
HDL: 57 mg/dL (ref >=46)
LDL Cholesterol: 87 mg/dL (ref 0–99)
TRIGLYCERIDES: 92 mg/dL (ref <150)
Total CHOL/HDL Ratio: 2.8 Ratio
VLDL: 18 mg/dL (ref 0–40)

## 2015-03-04 LAB — COMPLETE METABOLIC PANEL WITH GFR
ALT: 15 U/L (ref 0–35)
AST: 17 U/L (ref 0–37)
Albumin: 4.3 g/dL (ref 3.5–5.2)
Alkaline Phosphatase: 76 U/L (ref 39–117)
BILIRUBIN TOTAL: 0.3 mg/dL (ref 0.2–1.2)
BUN: 13 mg/dL (ref 6–23)
CO2: 29 mEq/L (ref 19–32)
Calcium: 9.7 mg/dL (ref 8.4–10.5)
Chloride: 104 mEq/L (ref 96–112)
Creat: 0.78 mg/dL (ref 0.50–1.10)
GLUCOSE: 85 mg/dL (ref 70–99)
Potassium: 4.4 mEq/L (ref 3.5–5.3)
Sodium: 141 mEq/L (ref 135–145)
Total Protein: 6.6 g/dL (ref 6.0–8.3)

## 2015-03-04 MED ORDER — ALENDRONATE SODIUM 70 MG PO TABS: 70.0000 mg | ORAL_TABLET | ORAL | Status: DC

## 2015-03-04 NOTE — Progress Notes (Signed)
Patient ID: Stacy Chambers MRN: [id number], DOB: [birth year], 49 y.o. Date of Encounter: _0 @  Chief Complaint:  Chief Complaint  Patient presents with  . knot on chest    HPI: 49 y.o. year old white female  presents with above complaint.   Recently felt this firm spot on her left breast. Says that she called to schedule a mammogram but they told her she needed to be evaluated here first.  Also says she needs refill on her Fosamax. She says that she sometimes forgets to take it because it is only once a week and she has to take it first thing in the morning etc. However says that she does sometimes take it and hasn't been completely off of it for a long period of time.  Also was taking cholesterol medication with no adverse effects.  No other complaints or concerns voiced today.     Past Medical History  Diagnosis Date  . RLS (restless legs syndrome)   . Bipolar disorder   . Hyperlipidemia   . Elevated liver function tests 11/26/2004    H/O NEGATIVE LIVER BIOPSY, workup negative  . Restless legs syndrome (RLS)   . Anxiety      Home Meds: Outpatient Prescriptions Prior to Visit  Medication Sig Dispense Refill  . busPIRone (BUSPAR) 10 MG tablet Take 1 tablet by mouth 2 (two) times daily.    . CRESTOR 10 MG tablet TAKE 1 TABLET EVERY DAY 90 tablet 0  . ziprasidone (GEODON) 40 MG capsule Take 1 capsule by mouth at bedtime.    . clonazePAM (KLONOPIN) 1 MG tablet Take 1 tablet (1 mg total) by mouth 3 (three) times daily as needed for anxiety. (Patient taking differently: Take 1 mg by mouth at bedtime. ) 60 tablet 00  . alendronate (FOSAMAX) 70 MG tablet Take 1 tablet (70 mg total) by mouth once a week. Take with a full glass of water on an empty stomach. (Patient not taking: Reported on 10/30/2014) 12 tablet 3  . Cholecalciferol (VITAMIN D-3) 5000 UNITS TABS Take 1 tablet by mouth daily.    . SUMAtriptan (IMITREX) 100 MG tablet TAKE 1 TABLET  AT  ONSET  OF  MIGRAINE.  IF   SYMPTOMS PERSIST IN 2 HOURS, MAY TAKE A 2ND DOSE. MAX 2 PER 24 HOURS (Patient not taking: Reported on 10/30/2014) 9 tablet 0  . WELCHOL 625 MG tablet TAKE 3 TABLETS (1,875 MG TOTAL) BY MOUTH 2 (TWO) TIMES DAILY WITH A MEAL. (Patient not taking: Reported on 03/04/2015) 540 tablet 5  . sertraline (ZOLOFT) 50 MG tablet Take 1 tablet by mouth daily.    Marland Kitchen zolpidem (AMBIEN) 10 MG tablet Take 1 tablet by mouth at bedtime as needed.     No facility-administered medications prior to visit.    Allergies:  Allergies  Allergen Reactions  . Benadryl [Diphenhydramine Hcl]     Hives/ rapid heartrate  . Benadryl  [Diphenhydramine]     Other reaction(s): Irregular heart rate  . Vicodin [Hydrocodone-Acetaminophen] Other (See Comments)    dizziness    History   Social History  . Marital Status: Married    Spouse Name: N/A  . Number of Children: N/A  . Years of Education: N/A   Occupational History  . Not on file.   Social History Main Topics  . Smoking status: Former Research scientist (life sciences)  . Smokeless tobacco: Never Used  . Alcohol Use: Yes     Comment: occasionally  . Drug Use: Yes  Special: Marijuana  . Sexual Activity: Yes    Birth Control/ Protection: Surgical   Other Topics Concern  . Not on file   Social History Narrative    Family History  Problem Relation Age of Onset  . COPD Mother   . Depression Mother   . Cancer Father     Lung CA, Skin Cancer--?type  . COPD Father   . Stroke Father   . Cancer Sister 73    cervical cancer  . COPD Brother   . Alcohol abuse Brother   . Stroke Brother   . COPD Maternal Aunt   . Depression Daughter   . Schizophrenia Son      Review of Systems:  See HPI for pertinent ROS. All other ROS negative.    Physical Exam: Blood pressure 104/66, pulse 60, temperature 98.3 F (36.8 C), temperature source Oral, resp. rate 18, weight 144 lb (65.318 kg)., Body mass index is 26.33 kg/(m^2). General: WNWD WF. Appears in no acute distress. Neck: Supple. No  thyromegaly. No lymphadenopathy. Breast Exam:  Right Breast: Normal Left Breast:  At 11 o/clock position--there is an approx 62m very firm/hard area.   At 3 o'clock to 4 o'clock position, there is "lumpy-bumpy area, consistent with fibrocystic changes. No rashes or skin changes. No nipple discharge. Lungs: Clear bilaterally to auscultation without wheezes, rales, or rhonchi. Breathing is unlabored. Heart: RRR with S1 S2. No murmurs, rubs, or gallops. Musculoskeletal:  Strength and tone normal for age. Extremities/Skin: Warm and dry.  No edema. Neuro: Alert and oriented X 3. Moves all extremities spontaneously. Gait is normal. CNII-XII grossly in tact. Psych:  Responds to questions appropriately with a normal affect.     ASSESSMENT AND PLAN:  49y.o. year old female with  1. Mass of breast, left  - MM Digital Diagnostic Unilat L; Future - MM Digital Screening; Future Last screening mammogram was 01/11/14 so she is due for screening mammogram. Patient has a personal history of ovarian and uterine cancer. She has no history of breast cancer. She reports that there is no family history of breast cancer as far as she is aware.  1. Hyperlipidemia  On Crestor and Welchol.  She came fasting for lab work this morning. Will follow-up those results.  2. Elevated liver function tests  H/O complete work up including liver biopsy which was all normal/negative. LFTs stable on statin. R23CME T. now.   3. COPD (chronic obstructive pulmonary disease)   4. Bipolar disorder, unspecified  Per Psychiatry     5. preventive health examination--she had CPE with me 11/22/2013. The following is copied from that visit.:  A. Screening Labs: She provides printed out copies of lab results to Dr. NBrunetta Generahad done at his office. She has results from 09/27/13, 06/28/13, 03/28/13. I have kept a copy of the labs dated 09/27/13 and will have these scanned into the computer system. The following labs were  included: CBC, CMET, TSH, Vit D, FLP CBC was completely normal. CMET completely normal-- except Alk Phos 246 / Alt 49 (she has history of known elevated LFTs and has undergone an evaluation of this including liver biopsy. She reports that the liver biopsy  revealed no diagnoses/definitive reason for the elevated LFTs) TSH, T4, T3, all normal. Vitamin D low at 19.5 Total cholesterol 159  Triglyceride 135 HDL 53 LDL 79   B. Pap: She reports that she had complete hysterectomy with bilateral oophorectomy at age [age]secondary to both ovarian and uterine cancer. She  states that she hasn't had a Pap smear in over 10 years. Because her hysterectomy was done secondary to a cancer we do need to do Pap smear. Pap smear obtained today and sent.  C. Screening Mammogram: She states she has had a mammogram. States that actually did the referral for her approximately one year ago and she had this done in [place] and it was normal. After her visit, I was able to go back to her paper chart. Last mammogram was 06/16/2012. Negative. Given her history of uterine and ovarian cancer and her age of 27, should go ahead and be getting annual mammograms. I will go ahead and order followup mammogram.  D. DEXA/BMD: She brought in a printed copy of DEXA/BMD results which were done by Dr. Brunetta Genera dated 04/20/13. T score of the hip was -1.9. T score of the spine was -2.04.  She is now on Fosamax. Pt states that the Fosamax was started right after obtaining the above DEXA reports. States that that was her first and only DEXA/BMD she has had.  E. Colorectal Cancer Screening: She reports that she had this been endoscopy and colonoscopy in approximately 2004 2005. Says it has revealed polyps but with no cancer. This was performed at the [facility] clinic. This was done by the same GI doctor who did her liver biopsy. After  her visit, I reviewed her paper chart and can find no documentation of her prior colonoscopy. I will go ahead and refer her back to the [facility] .  F. Immunizations:  Tetanus: Tdap given here 05/23/2014     Signed, Karis Juba, [place], [facility] 03/04/2015 11:19 AM

## 2015-03-04 NOTE — Addendum Note (Signed)
Addended by: Olena Mater on: 03/04/2015 03:08 PM   Modules accepted: Orders

## 2015-03-05 ENCOUNTER — Other Ambulatory Visit: Payer: Self-pay | Admitting: Physician Assistant

## 2015-03-05 ENCOUNTER — Ambulatory Visit
Admission: RE | Admit: 2015-03-05 | Discharge: 2015-03-05 | Disposition: A | Discharge status: Home / Self Care | Payer: Medicare FFS | Source: Ambulatory Visit | Attending: Physician Assistant | Admitting: Physician Assistant

## 2015-03-05 ENCOUNTER — Ambulatory Visit: Admission: RE | Admit: 2015-03-05 | Payer: Medicare FFS | Source: Ambulatory Visit

## 2015-03-05 DIAGNOSIS — N63 Unspecified lump in breast: Secondary | ICD-10-CM | POA: Diagnosis present

## 2015-03-05 DIAGNOSIS — N6320 Unspecified lump in the left breast, unspecified quadrant: Secondary | ICD-10-CM

## 2015-03-05 HISTORY — DX: Malignant neoplasm of unspecified ovary: C56.9

## 2015-03-05 HISTORY — DX: Malignant (primary) neoplasm, unspecified: C80.1

## 2015-03-07 ENCOUNTER — Encounter: Payer: Self-pay | Admitting: Family Medicine

## 2015-03-07 ENCOUNTER — Other Ambulatory Visit: Payer: Self-pay | Admitting: Physician Assistant

## 2015-03-07 DIAGNOSIS — N63 Unspecified lump in unspecified breast: Secondary | ICD-10-CM

## 2015-03-07 DIAGNOSIS — R928 Other abnormal and inconclusive findings on diagnostic imaging of breast: Secondary | ICD-10-CM

## 2015-03-12 ENCOUNTER — Other Ambulatory Visit: Payer: Self-pay | Admitting: Physician Assistant

## 2015-03-12 ENCOUNTER — Ambulatory Visit
Admission: RE | Admit: 2015-03-12 | Discharge: 2015-03-12 | Disposition: A | Discharge status: Home / Self Care | Payer: Medicare FFS | Source: Ambulatory Visit | Attending: Physician Assistant | Admitting: Physician Assistant

## 2015-03-12 DIAGNOSIS — N641 Fat necrosis of breast: Secondary | ICD-10-CM | POA: Insufficient documentation

## 2015-03-12 DIAGNOSIS — N63 Unspecified lump in unspecified breast: Secondary | ICD-10-CM

## 2015-03-12 DIAGNOSIS — R928 Other abnormal and inconclusive findings on diagnostic imaging of breast: Secondary | ICD-10-CM

## 2015-03-12 HISTORY — PX: BREAST BIOPSY: SHX20

## 2015-03-13 LAB — SURGICAL PATHOLOGY

## 2015-03-28 ENCOUNTER — Other Ambulatory Visit: Payer: Self-pay | Admitting: Psychiatry

## 2015-03-28 MED ORDER — SERTRALINE HCL 100 MG PO TABS
50.0000 mg | ORAL_TABLET | Freq: Two times a day (BID) | ORAL | Status: DC
Start: 2015-03-28 — End: 2015-04-09

## 2015-04-09 ENCOUNTER — Other Ambulatory Visit: Payer: Self-pay

## 2015-04-09 ENCOUNTER — Encounter: Payer: Self-pay | Admitting: Psychiatry

## 2015-04-09 ENCOUNTER — Ambulatory Visit (INDEPENDENT_AMBULATORY_CARE_PROVIDER_SITE_OTHER): Payer: Medicare FFS | Admitting: Psychiatry

## 2015-04-09 VITALS — BP 100/82 | HR 62 | Temp 98.2°F | Ht 64.0 in

## 2015-04-09 DIAGNOSIS — F310 Bipolar disorder, current episode hypomanic: Secondary | ICD-10-CM

## 2015-04-09 DIAGNOSIS — G2581 Restless legs syndrome: Secondary | ICD-10-CM | POA: Insufficient documentation

## 2015-04-09 MED ORDER — SERTRALINE HCL 100 MG PO TABS
50.0000 mg | ORAL_TABLET | Freq: Two times a day (BID) | ORAL | Status: DC
Start: 2015-04-09 — End: 2015-07-09

## 2015-04-09 MED ORDER — ZIPRASIDONE HCL 40 MG PO CAPS: 40.0000 mg | ORAL_CAPSULE | Freq: Every day | ORAL | Status: DC

## 2015-04-09 MED ORDER — CLONAZEPAM 1 MG PO TABS: 1.0000 mg | ORAL_TABLET | Freq: Every day | ORAL | Status: DC

## 2015-04-09 MED ORDER — BUSPIRONE HCL 10 MG PO TABS
10.0000 mg | ORAL_TABLET | Freq: Two times a day (BID) | ORAL | Status: DC
Start: 2015-04-09 — End: 2015-07-09

## 2015-04-09 NOTE — Progress Notes (Signed)
BH MD/PA/NP OP Progress Note  04/09/2015 10:08 AM Stacy Chambers  MRN:  [id number]  Subjective:    Patient is a 49 year old female with history of bipolar disorder who presented for the follow-up appointment. She reported that she has started improving on her medications and is able to sleep well. She is also taking melatonin at bedtime. She reported that she was riding bikes with her husband and has been going on the country roads and is enjoying her summer. She reported that she cannot ride for a long period of time due to back pain and arthritis in her hands. She is planning to stay at home and is going to go on short trips. Patient appeared much calmer during the interview. She reported that the current combination of medication is helping her. She currently denied having any adverse effects of the medication. She denied having any mood swings and anger anxiety or paranoia.   Chief Complaint:  Chief Complaint    Anxiety; Follow-up     Visit Diagnosis:     ICD-9-CM ICD-10-CM   1. Bipolar affective disorder, current episode hypomanic 296.40 F31.0     Past Medical History:  Past Medical History  Diagnosis Date  . RLS (restless legs syndrome)   . Bipolar disorder   . Hyperlipidemia   . Elevated liver function tests 11/26/2004    H/O NEGATIVE LIVER BIOPSY, workup negative  . Restless legs syndrome (RLS)   . Anxiety   . Breast mass 01/2015    left breast 11 oc  . Cancer 1995    uterine  . Ovarian cancer 1995  . Osteoarthritis     Past Surgical History  Procedure Laterality Date  . Abdominal hysterectomy    . Oophorectomy    . Cesarean section    . Liver biopsy  2005    PerPt: Done to eval elevated LFTs and biopsy provided no definite dx/cause of elevated LFTs   Family History:  Family History  Problem Relation Age of Onset  . Adopted: Yes  . COPD Mother   . Depression Mother   . Colon cancer Mother     unsure of age  . Cancer Father     Lung CA, Skin Cancer--?type  .  COPD Father   . Stroke Father   . Cancer Sister 31    cervical cancer  . COPD Brother   . Alcohol abuse Brother   . Stroke Brother   . COPD Maternal Aunt   . Depression Daughter   . Schizophrenia Son    Social History:  History   Social History  . Marital Status: Married    Spouse Name: N/A  . Number of Children: N/A  . Years of Education: N/A   Social History Main Topics  . Smoking status: Former Smoker    Types: Cigarettes    Quit date: 04/08/2009  . Smokeless tobacco: Never Used  . Alcohol Use: 1.8 oz/week    0 Standard drinks or equivalent, 3 Cans of beer per week     Comment: occasionally  . Drug Use: Yes    Special: Marijuana  . Sexual Activity: Yes    Birth Control/ Protection: Surgical   Other Topics Concern  . None   Social History Narrative   Additional History:   Lives with her husband  Assessment:   Musculoskeletal: Strength & Muscle Tone: within normal limits Gait & Station: normal Patient leans: N/A  Psychiatric Specialty Exam: HPI  Review of Systems  Constitutional: Negative for chills  and malaise/fatigue.  HENT: Negative for nosebleeds and tinnitus.   Eyes: Negative for photophobia.  Respiratory: Negative for hemoptysis.   Cardiovascular: Negative for palpitations.  Gastrointestinal: Negative for vomiting.  Genitourinary: Negative for urgency.  Musculoskeletal: Positive for back pain and joint pain. Negative for neck pain.  Skin: Negative for itching.  Neurological: Negative for tremors.  Endo/Heme/Allergies: Negative for environmental allergies.  Psychiatric/Behavioral: Negative for depression and hallucinations. The patient is nervous/anxious. The patient does not have insomnia.     Blood pressure 100/82, pulse 62, temperature 98.2 F (36.8 C), temperature source Tympanic, height 5\' 4"  (1.626 m), SpO2 97 %.There is no weight on file to calculate BMI.  General Appearance: Neat  Eye Contact:  Fair  Speech:  Normal Rate  Volume:   Normal  Mood:  Euthymic  Affect:  Congruent  Thought Process:  Goal Directed  Orientation:  Full (Time, Place, and Person)  Thought Content:  WDL  Suicidal Thoughts:  No  Homicidal Thoughts:  No  Memory:  Immediate;   Fair  Judgement:  Fair  Insight:  Fair  Psychomotor Activity:  Normal  Concentration:  Fair  Recall:  [employer] of Knowledge: Fair  Language: Fair  Akathisia:  No  Handed:  Right  AIMS (if indicated):  none  Assets:  Communication Skills Desire for Improvement Social Support  ADL's:  Intact  Cognition: WNL  Sleep:  6-7    Is the patient at risk to self?  No. Has the patient been a risk to self in the past 6 months?  No. Has the patient been a risk to self within the distant past?  No. Is the patient a risk to others?  No. Has the patient been a risk to others in the past 6 months?  No. Has the patient been a risk to others within the distant past?  No.  Current Medications: Current Outpatient Prescriptions  Medication Sig Dispense Refill  . alendronate (FOSAMAX) 70 MG tablet Take 1 tablet (70 mg total) by mouth once a week. Take with a full glass of water on an empty stomach. 12 tablet 3  . busPIRone (BUSPAR) 10 MG tablet Take 1 tablet by mouth 2 (two) times daily.    . Cholecalciferol (VITAMIN D-3) 5000 UNITS TABS Take 1 tablet by mouth daily.    . clonazePAM (KLONOPIN) 1 MG tablet Take 1 mg by mouth at bedtime.    . CRESTOR 10 MG tablet TAKE 1 TABLET EVERY DAY 90 tablet 0  . sertraline (ZOLOFT) 100 MG tablet Take 0.5 tablets (50 mg total) by mouth 2 (two) times daily. 180 tablet 3  . ziprasidone (GEODON) 40 MG capsule Take 1 capsule by mouth at bedtime.    . SUMAtriptan (IMITREX) 100 MG tablet TAKE 1 TABLET  AT  ONSET  OF  MIGRAINE.  IF  SYMPTOMS PERSIST IN 2 HOURS, MAY TAKE A 2ND DOSE. MAX 2 PER 24 HOURS (Patient not taking: Reported on 10/30/2014) 9 tablet 0  . WELCHOL 625 MG tablet TAKE 3 TABLETS (1,875 MG TOTAL) BY MOUTH 2 (TWO) TIMES DAILY WITH A MEAL.  (Patient not taking: Reported on 03/04/2015) 540 tablet 5   No current facility-administered medications for this visit.    Medical Decision Making:  Established Problem, Stable/Improving (1), Review and summation of old records (2) and Review or order medicine tests (1)  Treatment Plan Summary:Medication management   Discussed with patient about the medications and I will refill her medications for 90 day supply through  [facility] She will follow-up in 3 months Encouraged her to decrease her use of cannabis at this time and she demonstrated understanding   More than 50% of the time spent in psychoeducation, counseling and coordination of care.    This note was generated in part or whole with voice recognition software. Voice regonition is usually quite accurate but there are transcription errors that can and very often do occur. I apologize for any typographical errors that were not detected and corrected.    Rainey Pines 04/09/2015, 10:08 AM

## 2015-04-12 ENCOUNTER — Telehealth: Payer: Self-pay | Admitting: Psychiatry

## 2015-05-01 ENCOUNTER — Encounter: Payer: Self-pay | Admitting: Physician Assistant

## 2015-05-01 MED ORDER — ESOMEPRAZOLE MAGNESIUM 40 MG PO CPDR: 40.0000 mg | DELAYED_RELEASE_CAPSULE | Freq: Every day | ORAL | Status: DC

## 2015-05-01 NOTE — Telephone Encounter (Signed)
Rx to pharmacy and response sent back to pt via MyChart

## 2015-05-23 ENCOUNTER — Other Ambulatory Visit: Payer: Self-pay | Admitting: Psychiatry

## 2015-07-03 ENCOUNTER — Encounter: Payer: Self-pay | Admitting: Physician Assistant

## 2015-07-03 ENCOUNTER — Ambulatory Visit (INDEPENDENT_AMBULATORY_CARE_PROVIDER_SITE_OTHER): Payer: Medicare PPO | Admitting: Physician Assistant

## 2015-07-03 VITALS — BP 108/78 | HR 68 | Temp 98.4°F | Resp 18 | Ht 64.0 in | Wt 148.0 lb

## 2015-07-03 DIAGNOSIS — M545 Low back pain, unspecified: Secondary | ICD-10-CM

## 2015-07-03 MED ORDER — MELOXICAM 7.5 MG PO TABS: 7.5000 mg | ORAL_TABLET | Freq: Every day | ORAL | Status: DC

## 2015-07-03 NOTE — Progress Notes (Signed)
Patient ID: Stacy Chambers MRN: [id number], DOB: [birth year], 49 y.o. Date of Encounter: 07/03/2015, 10:02 AM    Chief Complaint:  Chief Complaint  Patient presents with  . Problems with spine     HPI: 49 y.o. year old white female says that she had an x-ray of her back in the past but says that that was back when she was seeing Dr. Brunetta Genera and says that the x-ray was done at the chiropractor. Says that she was told that the x-ray showed spurs and other problems. Says that recently she has been feeling more discomfort in her right low back over the past week than usual. Points to area just right of her spine at about L5 level as the area of pain.  Says that usually she may do some activity that causes her to feel some discomfort there, but usually it would resolve after about 2 days.  However says that over the past week she has been feeling the discomfort a little more often a little longer.  Says that her granddaughter is getting heavier. Says that after holding her and lifting her, that a couple days later she tried to pick up a 5 pound bag of potatoes and had to use her left hand instead of her right hand because otherwise it caused discomfort in that right low back.  She has had no pain no numbness no tingling no weakness down the right leg or in the foot. No incontinence of bowel or bladder.  She has used no medications for her back pain.        Home Meds:   Outpatient Prescriptions Prior to Visit  Medication Sig Dispense Refill  . alendronate (FOSAMAX) 70 MG tablet Take 1 tablet (70 mg total) by mouth once a week. Take with a full glass of water on an empty stomach. 12 tablet 3  . busPIRone (BUSPAR) 10 MG tablet Take 1 tablet (10 mg total) by mouth 2 (two) times daily. 180 tablet 2  . Cholecalciferol (VITAMIN D-3) 5000 UNITS TABS Take 1 tablet by mouth daily.    . clonazePAM (KLONOPIN) 1 MG tablet Take 1 tablet (1 mg total) by mouth at bedtime. 90 tablet 2  . CRESTOR 10 MG  tablet TAKE 1 TABLET EVERY DAY 90 tablet 0  . esomeprazole (NEXIUM) 40 MG capsule Take 1 capsule (40 mg total) by mouth daily at 12 noon. 30 capsule 3  . sertraline (ZOLOFT) 100 MG tablet Take 0.5 tablets (50 mg total) by mouth 2 (two) times daily. 180 tablet 3  . sertraline (ZOLOFT) 50 MG tablet TAKE 1 TABLET EVERY DAY 90 tablet 0  . SUMAtriptan (IMITREX) 100 MG tablet TAKE 1 TABLET  AT  ONSET  OF  MIGRAINE.  IF  SYMPTOMS PERSIST IN 2 HOURS, MAY TAKE A 2ND DOSE. MAX 2 PER 24 HOURS 9 tablet 0  . WELCHOL 625 MG tablet TAKE 3 TABLETS (1,875 MG TOTAL) BY MOUTH 2 (TWO) TIMES DAILY WITH A MEAL. 540 tablet 5  . ziprasidone (GEODON) 40 MG capsule Take 1 capsule (40 mg total) by mouth at bedtime. 90 capsule 1   No facility-administered medications prior to visit.    Allergies:  Allergies  Allergen Reactions  . Benadryl [Diphenhydramine Hcl]     Hives/ rapid heartrate  . Benadryl  [Diphenhydramine]     Other reaction(s): Irregular heart rate  . Vicodin [Hydrocodone-Acetaminophen] Other (See Comments)    dizziness      Review of Systems: See HPI  for pertinent ROS. All other ROS negative.    Physical Exam: Blood pressure 108/78, pulse 68, temperature 98.4 F (36.9 C), temperature source Oral, resp. rate 18, height 5\' 4"  (1.626 m), weight 148 lb (67.132 kg)., Body mass index is 25.39 kg/(m^2). General:  WNWD WF. Appears in no acute distress. Lungs: Clear bilaterally to auscultation without wheezes, rales, or rhonchi. Breathing is unlabored. Heart: Regular rhythm. No murmurs, rubs, or gallops. Msk:  Strength and tone normal for age. Mild tenderness with palpation of right low back just right of midline at about L5 level. No other areas of tenderness. 5/5 strength in all lateral legs. 2+, equal patella reflexes bilaterally. Extremities/Skin: Warm and dry. Neuro: Alert and oriented X 3. Moves all extremities spontaneously. Gait is normal. CNII-XII grossly in tact. Psych:  Responds to questions  appropriately with a normal affect.     ASSESSMENT AND PLAN:  49 y.o. year old female with  1. Right-sided low back pain without sciatica She is on multiple psych medications. Therefore, will avoid muscle relaxers and also have to avoid tramadol.  Her renal function was normal on last lab. She can take Motrin daily as needed on days of increased back pain and take with food. Also discussed applying heat to the area and the form of a heating pad or warm water in the shower and also gently stretching the area. - meloxicam (MOBIC) 7.5 MG tablet; Take 1 tablet (7.5 mg total) by mouth daily.  Dispense: 30 tablet; Refill: 0   Signed, [address] [place], [place], [facility] 07/03/2015 10:02 AM

## 2015-07-09 ENCOUNTER — Encounter: Payer: Self-pay | Admitting: Psychiatry

## 2015-07-09 ENCOUNTER — Ambulatory Visit (INDEPENDENT_AMBULATORY_CARE_PROVIDER_SITE_OTHER): Payer: Medicare FFS | Admitting: Psychiatry

## 2015-07-09 VITALS — BP 110/68 | HR 67 | Temp 97.9°F | Ht 64.0 in | Wt 146.4 lb

## 2015-07-09 DIAGNOSIS — F3160 Bipolar disorder, current episode mixed, unspecified: Secondary | ICD-10-CM

## 2015-07-09 MED ORDER — BUSPIRONE HCL 10 MG PO TABS
10.0000 mg | ORAL_TABLET | Freq: Two times a day (BID) | ORAL | Status: DC
Start: 2015-07-09 — End: 2015-10-08

## 2015-07-09 MED ORDER — CLONAZEPAM 0.5 MG PO TABS: 0.5000 mg | ORAL_TABLET | Freq: Every day | ORAL | Status: DC

## 2015-07-09 MED ORDER — CLONAZEPAM 1 MG PO TABS: 1.0000 mg | ORAL_TABLET | Freq: Every day | ORAL | Status: DC

## 2015-07-09 MED ORDER — ZIPRASIDONE HCL 40 MG PO CAPS: 40.0000 mg | ORAL_CAPSULE | ORAL | Status: DC

## 2015-07-09 MED ORDER — SERTRALINE HCL 100 MG PO TABS: 50.0000 mg | ORAL_TABLET | Freq: Two times a day (BID) | ORAL | Status: DC

## 2015-07-09 NOTE — Progress Notes (Signed)
BH MD/PA/NP OP Progress Note  07/09/2015 8:48 AM Stacy Chambers  MRN:  [id number]  Subjective:    Patient is a 49 year old female with history of bipolar disorder who presented for the follow-up appointment. She reported that she has issues  with her daughter as she is not allowing her to see her 37 month old grandchild. Patient reported that she is having mood swings. She reported that she feels angry and upset because her daughter has been telling her that there are mold on the toys in her house. She reported that she initially bought  brand-new toys  for the grandbaby but the daughter did not like them so she threw away all the toys. She now she has the secondhand toys  and has washed them in the bleach and her daughter is upset about the issue. She reported that the daughter can use the baking soda to clean the toys  and they are having arguments about the same issue. She reported that she has to go and see the granddaughter at her daughter's house and she is missing time with the baby. She remains focused on the same issue. She reported that she is also trying to wean herself off of the Klonopin and has stopped taking the medications suddenly. . She is compliant with her other medications. She reported that the mood is becoming more stable with the help of Geodon. She sleeps well at night.   Chief Complaint:  Chief Complaint    Follow-up; Medication Refill; Medication Problem; Depression     Visit Diagnosis:     ICD-9-CM ICD-10-CM   1. Bipolar I disorder, most recent episode mixed 296.60 F31.60     Past Medical History:  Past Medical History  Diagnosis Date  . RLS (restless legs syndrome)   . Bipolar disorder   . Hyperlipidemia   . Elevated liver function tests 11/26/2004    H/O NEGATIVE LIVER BIOPSY, workup negative  . Restless legs syndrome (RLS)   . Anxiety   . Breast mass 01/2015    left breast 11 oc  . Cancer 1995    uterine  . Ovarian cancer 1995  . Osteoarthritis      Past Surgical History  Procedure Laterality Date  . Abdominal hysterectomy    . Oophorectomy    . Cesarean section    . Liver biopsy  2005    PerPt: Done to eval elevated LFTs and biopsy provided no definite dx/cause of elevated LFTs   Family History:  Family History  Problem Relation Age of Onset  . Adopted: Yes  . COPD Mother   . Depression Mother   . Colon cancer Mother     unsure of age  . Cancer Father     Lung CA, Skin Cancer--?type  . COPD Father   . Stroke Father   . Cancer Sister 90    cervical cancer  . COPD Brother   . Alcohol abuse Brother   . Stroke Brother   . COPD Maternal Aunt   . Depression Daughter   . Schizophrenia Son    Social History:  Social History   Social History  . Marital Status: Married    Spouse Name: N/A  . Number of Children: N/A  . Years of Education: N/A   Social History Main Topics  . Smoking status: Former Smoker    Types: Cigarettes    Quit date: 04/08/2009  . Smokeless tobacco: Never Used  . Alcohol Use: 4.2 oz/week    0 Standard  drinks or equivalent, 7 Shots of liquor, 0 Cans of beer, 0 Glasses of wine per week     Comment: occasionally  . Drug Use: Yes    Special: Marijuana     Comment: last used last night  . Sexual Activity: Yes    Birth Control/ Protection: Surgical   Other Topics Concern  . None   Social History Narrative   Additional History:   Lives with her husband. Issues with daughter.   Assessment:   Musculoskeletal: Strength & Muscle Tone: within normal limits Gait & Station: normal Patient leans: N/A  Psychiatric Specialty Exam: Depression        Associated symptoms include does not have insomnia.   Review of Systems  Constitutional: Negative for chills and malaise/fatigue.  HENT: Negative for nosebleeds and tinnitus.   Eyes: Negative for photophobia.  Respiratory: Negative for hemoptysis.   Cardiovascular: Negative for palpitations.  Gastrointestinal: Negative for vomiting.   Genitourinary: Negative for urgency.  Musculoskeletal: Positive for back pain and joint pain. Negative for neck pain.  Skin: Negative for itching.  Neurological: Negative for tremors.  Endo/Heme/Allergies: Negative for environmental allergies.  Psychiatric/Behavioral: Positive for depression. Negative for hallucinations. The patient is nervous/anxious. The patient does not have insomnia.     Blood pressure 110/68, pulse 67, temperature 97.9 F (36.6 C), temperature source Tympanic, height 5\' 4"  (1.626 m), weight 146 lb 6.4 oz (66.407 kg), SpO2 98 %.Body mass index is 25.12 kg/(m^2).  General Appearance: Neat  Eye Contact:  Fair  Speech:  Normal Rate  Volume:  Normal  Mood:  Euthymic  Affect:  Congruent  Thought Process:  Goal Directed  Orientation:  Full (Time, Place, and Person)  Thought Content:  WDL  Suicidal Thoughts:  No  Homicidal Thoughts:  No  Memory:  Immediate;   Fair  Judgement:  Fair  Insight:  Fair  Psychomotor Activity:  Normal  Concentration:  Fair  Recall:  [employer] of Knowledge: Fair  Language: Fair  Akathisia:  No  Handed:  Right  AIMS (if indicated):  none  Assets:  Communication Skills Desire for Improvement Social Support  ADL's:  Intact  Cognition: WNL  Sleep:  6-7    Is the patient at risk to self?  No. Has the patient been a risk to self in the past 6 months?  No. Has the patient been a risk to self within the distant past?  No. Is the patient a risk to others?  No. Has the patient been a risk to others in the past 6 months?  No. Has the patient been a risk to others within the distant past?  No.  Current Medications: Current Outpatient Prescriptions  Medication Sig Dispense Refill  . alendronate (FOSAMAX) 70 MG tablet Take 1 tablet (70 mg total) by mouth once a week. Take with a full glass of water on an empty stomach. 12 tablet 3  . busPIRone (BUSPAR) 10 MG tablet Take 1 tablet (10 mg total) by mouth 2 (two) times daily. 180 tablet 2  .  Cholecalciferol (VITAMIN D-3) 5000 UNITS TABS Take 1 tablet by mouth daily.    . clonazePAM (KLONOPIN) 1 MG tablet Take 1 tablet (1 mg total) by mouth at bedtime. 90 tablet 2  . CRESTOR 10 MG tablet TAKE 1 TABLET EVERY DAY 90 tablet 0  . esomeprazole (NEXIUM) 40 MG capsule TAKE 1 CAPSULE (40 MG TOTAL) BY MOUTH DAILY AT 12 NOON.  3  . meloxicam (MOBIC) 7.5 MG tablet Take  1 tablet (7.5 mg total) by mouth daily. 30 tablet 0  . sertraline (ZOLOFT) 100 MG tablet Take 0.5 tablets (50 mg total) by mouth 2 (two) times daily. 180 tablet 3  . sertraline (ZOLOFT) 50 MG tablet     . SUMAtriptan (IMITREX) 100 MG tablet TAKE 1 TABLET  AT  ONSET  OF  MIGRAINE.  IF  SYMPTOMS PERSIST IN 2 HOURS, MAY TAKE A 2ND DOSE. MAX 2 PER 24 HOURS 9 tablet 0  . ziprasidone (GEODON) 40 MG capsule Take 1 capsule (40 mg total) by mouth at bedtime. 90 capsule 1   No current facility-administered medications for this visit.    Medical Decision Making:  Established Problem, Stable/Improving (1), Review and summation of old records (2) and Review or order medicine tests (1)  Treatment Plan Summary:Medication management   Discussed with patient about the medications and I will refill her medications for 90 day supply through [facility] patient to taper the Klonopin and she was given 15 day supply of the medications.  She will follow-up in 3 months     More than 50% of the time spent in psychoeducation, counseling and coordination of care.    This note was generated in part or whole with voice recognition software. Voice regonition is usually quite accurate but there are transcription errors that can and very often do occur. I apologize for any typographical errors that were not detected and corrected.    Rainey Pines 07/09/2015, 8:48 AM

## 2015-07-12 ENCOUNTER — Telehealth: Payer: Self-pay

## 2015-07-12 ENCOUNTER — Telehealth: Payer: Self-pay | Admitting: Physician Assistant

## 2015-07-12 NOTE — Telephone Encounter (Signed)
Dx restless leg on problem list.  Has not has Rx for requip since early 2015.  Please advise!

## 2015-07-12 NOTE — Telephone Encounter (Signed)
Tell her I am concerned about using this medication with all of her psych medications.

## 2015-07-12 NOTE — Telephone Encounter (Signed)
pt called states that her restless leg is acting up and that she was told that she could not take the medication for restless leg because of the medication you have her on , so she wants you to put her on something that will not inter with the restless leg medication

## 2015-07-12 NOTE — Telephone Encounter (Signed)
Pt called her [facility] provider.  The Geodon is the med that interacts with the Requip.  Her question is what else can she use for her restless legs??

## 2015-07-12 NOTE — Telephone Encounter (Signed)
Pt is calling to request a rx for Requip for her restless leg syndrome. She uses the [facility]. You may reach her @ [phone]

## 2015-07-12 NOTE — Telephone Encounter (Signed)
Pt not taking Clonazepam any more.  Discontinued herself which she discussed with her [facility] provider.  [facility] provider was not happy, told she should not have done that.  Says has been off for more then a week and her restless legs are returning.  Please advise!

## 2015-07-15 ENCOUNTER — Telehealth: Payer: Self-pay | Admitting: Psychiatry

## 2015-07-15 NOTE — Telephone Encounter (Signed)
Based on Kim's message--Psych was not happy that pt had stopped clonazepam--and this was controlling the restless leg. Recomment restarting the clonazepam

## 2015-07-15 NOTE — Telephone Encounter (Signed)
Spoke to patient.  Told to resume her Clonazepam.

## 2015-07-16 NOTE — Telephone Encounter (Signed)
Please advise pt to take Vit E 500 IU on a daily basis for restless legs

## 2015-07-18 MED ORDER — BENZTROPINE MESYLATE 0.5 MG PO TABS: 0.5000 mg | ORAL_TABLET | Freq: Every day | ORAL | Status: DC

## 2015-07-18 NOTE — Telephone Encounter (Signed)
pt was told to start taking the vitamin e , pt was not happy that dr did not give  a rx but she states she will try it.

## 2015-07-18 NOTE — Telephone Encounter (Signed)
Pt will be started on Cogentin to help with Restless legs with geodon. She is already on Klonopin.

## 2015-07-23 ENCOUNTER — Telehealth: Payer: Self-pay | Admitting: Psychiatry

## 2015-07-24 ENCOUNTER — Other Ambulatory Visit: Payer: Self-pay | Admitting: Physician Assistant

## 2015-07-24 ENCOUNTER — Telehealth: Payer: Self-pay

## 2015-07-24 NOTE — Telephone Encounter (Signed)
dr. Gretel Acre pt.   pt called states that the medications is not working. pt states she is crying , depressed, mood swings, short tempter. pt states that her daughter told her that she could not see her granddaughter anymore because of her mouth.  pt states she takes zoloft 50 in the am and 50 in the pm. Pt takes Gedon 40mg  1 a day. And that she is off klonopin.  Pt was seen on 07-09-15 next appt  10-08-15

## 2015-07-24 NOTE — Telephone Encounter (Signed)
spoke with pt. told patient to continue to take medication like dr. Gretel Acre advised and that when Dr. Gretel Acre comes back on monday she will advise.

## 2015-07-25 NOTE — Telephone Encounter (Signed)
Refill appropriate and filled per protocol. 

## 2015-07-30 MED ORDER — RISPERIDONE 0.5 MG PO TBDP: 0.5000 mg | ORAL_TABLET | Freq: Every day | ORAL | Status: DC

## 2015-07-30 NOTE — Telephone Encounter (Signed)
Called pt about her anger and relationship issues with her daughter. She stated that she cries a lot and her husband calls it 'water works'. She is not responding to Geodon which she has been taking for last 2 years. Discussed about Risperdal and she agreed with plan. She does not have any allergies to it. Will dispense 2 months supply at this time.

## 2015-08-19 ENCOUNTER — Telehealth: Payer: Self-pay | Admitting: Psychiatry

## 2015-08-20 ENCOUNTER — Encounter: Payer: Self-pay | Admitting: Physician Assistant

## 2015-08-21 ENCOUNTER — Other Ambulatory Visit: Payer: Self-pay | Admitting: Psychiatry

## 2015-08-21 MED ORDER — LUBIPROSTONE 24 MCG PO CAPS: 24.0000 ug | ORAL_CAPSULE | Freq: Two times a day (BID) | ORAL | Status: DC

## 2015-08-21 NOTE — Addendum Note (Signed)
Addended by: Olena Mater on: 08/21/2015 02:13 PM   Modules accepted: Orders

## 2015-08-28 ENCOUNTER — Other Ambulatory Visit: Payer: Self-pay | Admitting: Psychiatry

## 2015-09-13 ENCOUNTER — Telehealth: Payer: Self-pay

## 2015-09-13 NOTE — Telephone Encounter (Signed)
pt wants to find out why she received a rx in the mail for cogentin.  what is it for.  pt states she also has not heard back from her call on 08-19-15 about the risperdal and the weight gain.  pt also stated that she is not taking the klonopin.

## 2015-09-16 ENCOUNTER — Other Ambulatory Visit: Payer: Self-pay | Admitting: Family Medicine

## 2015-09-16 NOTE — Telephone Encounter (Signed)
Please advise pt to make a follow up appointment to discuss about her medications and all her concerns.

## 2015-09-16 NOTE — Telephone Encounter (Signed)
Refill appropriate and filled per protocol. 

## 2015-09-18 NOTE — Telephone Encounter (Signed)
pt was called and she stated that she will wait until she come in on  10-08-15

## 2015-09-25 NOTE — Progress Notes (Signed)
Pt still taking refilled on 08-27-15

## 2015-10-02 ENCOUNTER — Ambulatory Visit (INDEPENDENT_AMBULATORY_CARE_PROVIDER_SITE_OTHER): Payer: Medicare PPO | Admitting: Physician Assistant

## 2015-10-02 ENCOUNTER — Encounter: Payer: Self-pay | Admitting: Physician Assistant

## 2015-10-02 VITALS — BP 102/70 | HR 68 | Temp 98.4°F | Resp 18 | Wt 155.0 lb

## 2015-10-02 DIAGNOSIS — M542 Cervicalgia: Secondary | ICD-10-CM

## 2015-10-02 NOTE — Progress Notes (Signed)
Patient ID: Stacy Chambers MRN: [id number], DOB: 06-08-1966, 49 y.o. Date of Encounter: 10/02/2015, 9:56 AM    Chief Complaint:  Chief Complaint  Patient presents with  . neck/back pain    for awhile getting worse     HPI: 49 y.o. year old white female presents for above. Says that she first noticed some discomfort in the left side of her neck this past summer. She notes that at that time she was riding her motorcycle wearing a helmet and thinks that maybe that was contributing to that neck discomfort. However she states that she has not been riding the motorcycle recently secondary to cold weather and despite not being on the motorcycle and wearing that helmet she is still having some discomfort in the left side of her neck. She says that she can be sitting at rest on the couch relaxed and filled no discomfort. However when she is up cleaning the house etc. she feels discomfort in the left neck and left scapula area. Is having no pain no numbness no tingling no weakness in either of her arms. Also says that she is having discomfort in her right low back--- she had an office visit with me regarding this pain on 07/03/15 and I reviewed that note. She says that compared to September that now the discomfort seems to be going up her back and she points to her right low back and the L1-L4 region. Again says that when she is sitting at rest with her muscle supported she feels no discomfort there but when she is up cleaning the house etc. she feels discomfort there. Is having no pain no numbness no tingling no weakness down either of her legs.     Home Meds:   Outpatient Prescriptions Prior to Visit  Medication Sig Dispense Refill  . alendronate (FOSAMAX) 70 MG tablet Take 1 tablet (70 mg total) by mouth once a week. Take with a full glass of water on an empty stomach. 12 tablet 3  . busPIRone (BUSPAR) 10 MG tablet Take 1 tablet (10 mg total) by mouth 2 (two) times daily. 180 tablet 2  .  lubiprostone (AMITIZA) 24 MCG capsule Take 1 capsule (24 mcg total) by mouth 2 (two) times daily with a meal. 180 capsule 3  . rosuvastatin (CRESTOR) 10 MG tablet TAKE 1 TABLET EVERY DAY 90 tablet 0  . sertraline (ZOLOFT) 100 MG tablet Take 0.5 tablets (50 mg total) by mouth 2 (two) times daily. 180 tablet 3  . benztropine (COGENTIN) 0.5 MG tablet TAKE 1 TABLET AT BEDTIME (SUBSTITUTED FOR  COGENTIN) 60 tablet 1  . Cholecalciferol (VITAMIN D-3) 5000 UNITS TABS Take 1 tablet by mouth daily.    . clonazePAM (KLONOPIN) 0.5 MG tablet Take 1 tablet (0.5 mg total) by mouth at bedtime. (Patient not taking: Reported on 07/12/2015) 15 tablet 0  . clonazePAM (KLONOPIN) 1 MG tablet Take 1 tablet (1 mg total) by mouth at bedtime. (Patient not taking: Reported on 07/12/2015) 90 tablet 2  . esomeprazole (NEXIUM) 40 MG capsule TAKE 1 CAPSULE (40 MG TOTAL) BY MOUTH DAILY AT 12 NOON.  3  . meloxicam (MOBIC) 7.5 MG tablet TAKE 1 TABLET EVERY DAY (Patient not taking: Reported on 10/02/2015) 30 tablet 6  . risperiDONE (RISPERDAL M-TAB) 0.5 MG disintegrating tablet Take 1 tablet (0.5 mg total) by mouth at bedtime. (Patient not taking: Reported on 10/02/2015) 30 tablet 1  . SUMAtriptan (IMITREX) 100 MG tablet TAKE 1 TABLET  AT  ONSET  OF  MIGRAINE.  IF  SYMPTOMS PERSIST IN 2 HOURS, MAY TAKE A 2ND DOSE. MAX 2 PER 24 HOURS (Patient not taking: Reported on 10/02/2015) 9 tablet 0  . sertraline (ZOLOFT) 50 MG tablet TAKE 1 TABLET EVERY DAY 90 tablet 0   No facility-administered medications prior to visit.    Allergies:  Allergies  Allergen Reactions  . Benadryl [Diphenhydramine Hcl]     Hives/ rapid heartrate  . Benadryl  [Diphenhydramine]     Other reaction(s): Irregular heart rate  . Vicodin [Hydrocodone-Acetaminophen] Other (See Comments)    dizziness      Review of Systems: See HPI for pertinent ROS. All other ROS negative.    Physical Exam: Blood pressure 102/70, pulse 68, temperature 98.4 F (36.9 C),  temperature source Oral, resp. rate 18, weight 155 lb (70.308 kg)., Body mass index is 26.59 kg/(m^2). General:  WF. Appears in no acute distress. Neck: Supple. No thyromegaly. No lymphadenopathy. Lungs: Clear bilaterally to auscultation without wheezes, rales, or rhonchi. Breathing is unlabored. Heart: Regular rhythm. No murmurs, rubs, or gallops. Msk:  Strength and tone normal for age. She has mild tenderness with palpation of the left side of the neck and in the left peri-scapular region.  She has full range of motion of the neck with tilting the head and with turning the head to each side. She has full range of motion of the shoulders bilaterally. She has mild tenderness with palpation of the right low back from L2 to L4. Extremities/Skin: Warm and dry. No clubbing or cyanosis. No edema. No rashes or suspicious lesions. Neuro: Alert and oriented X 3. Moves all extremities spontaneously. Gait is normal. CNII-XII grossly in tact. Psych:  Responds to questions appropriately with a normal affect.     ASSESSMENT AND PLAN:  49 y.o. year old female with  1. Neck pain on left side Given her psych medications we cannot use muscle relaxers or tramadol. She is already using Mobic. Told her to apply heat to these areas using either heating pad or very warm water in the shower to these areas. Also discussed specific stretches for her to do--- she is to repeat these multiple times throughout the day--- even when she is not having significant symptoms she needs to continue the stretches to prevent flares of pain. She is to do range of motion of the neck and range of motion of the shoulders and also discussed specific stretches for her low back.  2. Bilateral low back pain Given her psych medications we cannot use muscle relaxers or tramadol. She is already using Mobic. Told her to apply heat to these areas using either heating pad or very warm water in the shower to these areas. Also discussed specific  stretches for her to do--- she is to repeat these multiple times throughout the day--- even when she is not having significant symptoms she needs to continue the stretches to prevent flares of pain. She is to do range of motion of the neck and range of motion of the shoulders and also discussed specific stretches for her low back.    Signed, [address] [place], [place], [facility] 10/02/2015 9:56 AM

## 2015-10-03 DIAGNOSIS — R799 Abnormal finding of blood chemistry, unspecified: Secondary | ICD-10-CM

## 2015-10-03 DIAGNOSIS — J449 Chronic obstructive pulmonary disease, unspecified: Secondary | ICD-10-CM

## 2015-10-03 DIAGNOSIS — E785 Hyperlipidemia, unspecified: Secondary | ICD-10-CM | POA: Insufficient documentation

## 2015-10-03 HISTORY — DX: Abnormal finding of blood chemistry, unspecified: R79.9

## 2015-10-03 HISTORY — DX: Chronic obstructive pulmonary disease, unspecified: J44.9

## 2015-10-05 ENCOUNTER — Other Ambulatory Visit: Payer: Self-pay | Admitting: Physician Assistant

## 2015-10-07 NOTE — Telephone Encounter (Signed)
Medication refilled per protocol. 

## 2015-10-08 ENCOUNTER — Encounter: Payer: Self-pay | Admitting: Psychiatry

## 2015-10-08 ENCOUNTER — Ambulatory Visit (INDEPENDENT_AMBULATORY_CARE_PROVIDER_SITE_OTHER): Payer: Medicare FFS | Admitting: Psychiatry

## 2015-10-08 VITALS — BP 110/68 | HR 75 | Temp 97.9°F | Ht 64.0 in | Wt 153.0 lb

## 2015-10-08 DIAGNOSIS — F3160 Bipolar disorder, current episode mixed, unspecified: Secondary | ICD-10-CM | POA: Diagnosis not present

## 2015-10-08 MED ORDER — SERTRALINE HCL 50 MG PO TABS: 50.0000 mg | ORAL_TABLET | Freq: Every day | ORAL | Status: DC

## 2015-10-08 MED ORDER — BUSPIRONE HCL 10 MG PO TABS: 10.0000 mg | ORAL_TABLET | Freq: Two times a day (BID) | ORAL | Status: DC

## 2015-10-08 MED ORDER — RISPERIDONE 1 MG PO TABS: 1.0000 mg | ORAL_TABLET | Freq: Every day | ORAL | Status: DC

## 2015-10-08 NOTE — Progress Notes (Signed)
BH MD/PA/NP OP Progress Note  10/08/2015 9:11 AM Stacy Chambers  MRN:  [id number]  Subjective:    Patient is a 49 year old female with history of bipolar disorder who presented for the follow-up appointment. She reported that she has been very fussy at her husband for the past month as she is cooped and into her house. She reported that she is unable to control her temper and she keeps fussing and fighting with her husband most of the day. She reported that her husband is concerned about her behavior. She reported that she was fine during the summer as she keeps on riding her bike but since it's winter she has nothing to do at home. She reported that she thinks that the medications are not helping her. She reported that she does not have any hobbies and does not know how to spend her time. She keeps texting her husband and will keep bothering her. She feels that she needs something to control her agitation and behavioral issues at this time. She reported that she does not enjoy cooking and baking or doing other household chores. She reported that she does not enjoy the holidays either as most of her family members passed away during 10/24/2023 of the year. She is looking forward for the holidays to be over soon.  She appeared calm and cooperative during the interview.    Chief Complaint:  Chief Complaint    Follow-up; Medication Refill; Panic Attack; Anxiety     Visit Diagnosis:     ICD-9-CM ICD-10-CM   1. Bipolar I disorder, most recent episode mixed ([place]) 296.60 F31.60     Past Medical History:  Past Medical History  Diagnosis Date  . RLS (restless legs syndrome)   . Bipolar disorder ([place])   . Hyperlipidemia   . Elevated liver function tests 11/26/2004    H/O NEGATIVE LIVER BIOPSY, workup negative  . Restless legs syndrome (RLS)   . Anxiety   . Breast mass 01/2015    left breast 11 oc  . Cancer ([place]) 1995    uterine  . Ovarian cancer ([place]) 1995  . Osteoarthritis     Past Surgical  History  Procedure Laterality Date  . Abdominal hysterectomy    . Oophorectomy    . Cesarean section    . Liver biopsy  2005    PerPt: Done to eval elevated LFTs and biopsy provided no definite dx/cause of elevated LFTs   Family History:  Family History  Problem Relation Age of Onset  . Adopted: Yes  . COPD Mother   . Depression Mother   . Colon cancer Mother     unsure of age  . Cancer Father     Lung CA, Skin Cancer--?type  . COPD Father   . Stroke Father   . Cancer Sister 85    cervical cancer  . COPD Brother   . Alcohol abuse Brother   . Stroke Brother   . COPD Maternal Aunt   . Depression Daughter   . Schizophrenia Son    Social History:  Social History   Social History  . Marital Status: Married    Spouse Name: N/A  . Number of Children: N/A  . Years of Education: N/A   Social History Main Topics  . Smoking status: Former Smoker    Types: Cigarettes    Quit date: 04/08/2009  . Smokeless tobacco: Never Used  . Alcohol Use: 1.8 - 4.2 oz/week    0 Glasses of wine, 0 Cans  of beer, 0 Standard drinks or equivalent, 3-7 Shots of liquor per week     Comment: occasionally  . Drug Use: Yes    Special: Marijuana     Comment: last used last night  . Sexual Activity: Yes    Birth Control/ Protection: Surgical   Other Topics Concern  . None   Social History Narrative   Additional History:   Lives with her husband.  Assessment:   Musculoskeletal: Strength & Muscle Tone: within normal limits Gait & Station: normal Patient leans: N/A  Psychiatric Specialty Exam: Anxiety Symptoms include nervous/anxious behavior. Patient reports no insomnia or palpitations.            Associated symptoms include does not have insomnia.  Past medical history includes anxiety.     Review of Systems  Constitutional: Negative for chills and malaise/fatigue.  HENT: Negative for nosebleeds and tinnitus.   Eyes: Negative for photophobia.  Respiratory: Negative for  hemoptysis.   Cardiovascular: Negative for palpitations.  Gastrointestinal: Negative for vomiting.  Genitourinary: Negative for urgency.  Musculoskeletal: Positive for back pain and joint pain. Negative for neck pain.  Skin: Negative for itching.  Neurological: Negative for tremors.  Endo/Heme/Allergies: Negative for environmental allergies.  Psychiatric/Behavioral: Positive for depression. Negative for hallucinations. The patient is nervous/anxious. The patient does not have insomnia.     Blood pressure 110/68, pulse 75, temperature 97.9 F (36.6 C), temperature source Tympanic, height 5\' 4"  (1.626 m), weight 153 lb (69.4 kg), SpO2 97 %.Body mass index is 26.25 kg/(m^2).  General Appearance: Neat  Eye Contact:  Fair  Speech:  Normal Rate  Volume:  Normal  Mood:  Euthymic  Affect:  Congruent  Thought Process:  Goal Directed  Orientation:  Full (Time, Place, and Person)  Thought Content:  WDL  Suicidal Thoughts:  No  Homicidal Thoughts:  No  Memory:  Immediate;   Fair  Judgement:  Fair  Insight:  Fair  Psychomotor Activity:  Normal  Concentration:  Fair  Recall:  [employer] of Knowledge: Fair  Language: Fair  Akathisia:  No  Handed:  Right  AIMS (if indicated):  none  Assets:  Communication Skills Desire for Improvement Social Support  ADL's:  Intact  Cognition: WNL  Sleep:  6-7    Is the patient at risk to self?  No. Has the patient been a risk to self in the past 6 months?  No. Has the patient been a risk to self within the distant past?  No. Is the patient a risk to others?  No. Has the patient been a risk to others in the past 6 months?  No. Has the patient been a risk to others within the distant past?  No.  Current Medications: Current Outpatient Prescriptions  Medication Sig Dispense Refill  . alendronate (FOSAMAX) 70 MG tablet Take 1 tablet (70 mg total) by mouth once a week. Take with a full glass of water on an empty stomach. 12 tablet 3  . benztropine  (COGENTIN) 0.5 MG tablet TAKE 1 TABLET AT BEDTIME (SUBSTITUTED FOR  COGENTIN) 60 tablet 1  . busPIRone (BUSPAR) 10 MG tablet Take 1 tablet (10 mg total) by mouth 2 (two) times daily. 180 tablet 2  . Cholecalciferol (VITAMIN D-3) 5000 UNITS TABS Take 1 tablet by mouth daily.    . clonazePAM (KLONOPIN) 1 MG tablet Take 1 tablet (1 mg total) by mouth at bedtime. 90 tablet 2  . fexofenadine-pseudoephedrine (ALLEGRA-D 24) 180-240 MG 24 hr tablet as needed.    Marland Kitchen  lubiprostone (AMITIZA) 24 MCG capsule Take 1 capsule (24 mcg total) by mouth 2 (two) times daily with a meal. 180 capsule 3  . Melatonin 5 MG TABS Take 1 tablet by mouth at bedtime as needed.    . meloxicam (MOBIC) 7.5 MG tablet TAKE 1 TABLET EVERY DAY 30 tablet 6  . risperiDONE (RISPERDAL M-TAB) 0.5 MG disintegrating tablet Take 1 tablet (0.5 mg total) by mouth at bedtime. 30 tablet 1  . rosuvastatin (CRESTOR) 10 MG tablet TAKE 1 TABLET EVERY DAY 90 tablet 0  . sertraline (ZOLOFT) 100 MG tablet Take 0.5 tablets (50 mg total) by mouth 2 (two) times daily. 180 tablet 3   No current facility-administered medications for this visit.    Medical Decision Making:  Established Problem, Stable/Improving (1), Review and summation of old records (2) and Review or order medicine tests (1)  Treatment Plan Summary:Medication management   Discussed with patient about the medications and will titrate the dose of Risperdal to 1 mg at bedtime. It will  help with her agitation and behavioral problems. She  will continue on sertraline 50 mg in the morning I will refill her medications for 90 day supply through [place] She will also continue on BuSpar 10 mg twice a day for anxiety She takes Klonopin on a when necessary basis  She will follow-up in 3 months     More than 50% of the time spent in psychoeducation, counseling and coordination of care.    This note was generated in part or whole with voice recognition software. Voice regonition is  usually quite accurate but there are transcription errors that can and very often do occur. I apologize for any typographical errors that were not detected and corrected.    Rainey Pines 10/08/2015, 9:11 AM

## 2015-10-23 NOTE — Progress Notes (Signed)
Pharmacy notified.

## 2015-10-29 ENCOUNTER — Other Ambulatory Visit: Payer: Self-pay | Admitting: Physician Assistant

## 2015-10-29 NOTE — Telephone Encounter (Signed)
Imitrex refilll denied.  Was discontinued at last OV with Dr Campbell Lerner.  States pt has not been using.

## 2015-10-31 ENCOUNTER — Other Ambulatory Visit: Payer: Self-pay

## 2015-10-31 ENCOUNTER — Encounter: Payer: Self-pay | Admitting: Physician Assistant

## 2015-10-31 MED ORDER — FEXOFENADINE-PSEUDOEPHED ER 180-240 MG PO TB24: 1.0000 | ORAL_TABLET | Freq: Every day | ORAL | Status: DC | PRN

## 2015-10-31 NOTE — Telephone Encounter (Signed)
pt called states she needs a refill on her klonipin 1 mg .  pt also states that she still having issues with restless legs. that the vit e is not working.  pt last seen on  10-08-15 next appt  01-07-16

## 2015-11-11 ENCOUNTER — Other Ambulatory Visit: Payer: Self-pay | Admitting: Psychiatry

## 2015-11-18 ENCOUNTER — Encounter: Payer: Self-pay | Admitting: Physician Assistant

## 2015-11-18 ENCOUNTER — Other Ambulatory Visit: Payer: Self-pay | Admitting: Psychiatry

## 2015-12-05 ENCOUNTER — Telehealth: Payer: Self-pay | Admitting: Psychiatry

## 2015-12-06 NOTE — Telephone Encounter (Signed)
pt called states that she can not stop eating , she doesn't want to be on the risperdol anymore she had done nothing but eat. wants to be put on ritalin.

## 2015-12-10 MED ORDER — CLONAZEPAM 1 MG PO TABS: 1.0000 mg | ORAL_TABLET | Freq: Every day | ORAL | Status: DC

## 2015-12-10 NOTE — Telephone Encounter (Signed)
Please advise pt to make appointment to discuss medication options.

## 2015-12-12 NOTE — Telephone Encounter (Signed)
called pt made her an appt for your next available date for  12-24-15.  pt states that is tool long she needs something now.

## 2015-12-12 NOTE — Telephone Encounter (Signed)
I called patient per Dr. Gretel Acre and told patient that she would need to go to urgent care that Dr. Gretel Acre would not give her anything. That she didn't tell patient to stop medication. pt stated Dr. Gretel Acre didn't care and that she is having health issues because she went to a ______ size 4 to a _____ size 14 , pt cused me out and hung up phone.

## 2015-12-12 NOTE — Telephone Encounter (Signed)
[facility] answering services sent a fax letting us know that pt called is she is not taking her medications risperidone medication made her gain weight, have numbness in her hands.  pt states she is haveing anger issues, very angery , numbness still in her hands.  Pt states she needs something else to take.  Pt states that her husband can not put up with her like this. Pt needs to have dr. Gretel Acre call her

## 2015-12-24 ENCOUNTER — Ambulatory Visit (INDEPENDENT_AMBULATORY_CARE_PROVIDER_SITE_OTHER): Payer: 59 | Admitting: Psychiatry

## 2015-12-24 ENCOUNTER — Encounter: Payer: Self-pay | Admitting: Psychiatry

## 2015-12-24 VITALS — BP 122/76 | HR 75 | Temp 97.0°F | Ht 64.0 in | Wt 179.8 lb

## 2015-12-24 DIAGNOSIS — F310 Bipolar disorder, current episode hypomanic: Secondary | ICD-10-CM | POA: Diagnosis not present

## 2015-12-24 MED ORDER — TOPIRAMATE 50 MG PO TABS: 50.0000 mg | ORAL_TABLET | Freq: Every day | ORAL | Status: DC

## 2015-12-24 MED ORDER — RISPERIDONE 0.5 MG PO TABS: 1.0000 mg | ORAL_TABLET | Freq: Every day | ORAL | Status: DC

## 2015-12-24 MED ORDER — BUSPIRONE HCL 10 MG PO TABS: 10.0000 mg | ORAL_TABLET | Freq: Two times a day (BID) | ORAL | Status: DC

## 2015-12-24 MED ORDER — BENZTROPINE MESYLATE 0.5 MG PO TABS: 0.5000 mg | ORAL_TABLET | Freq: Two times a day (BID) | ORAL | Status: DC

## 2015-12-24 NOTE — Progress Notes (Signed)
BH MD/PA/NP OP Progress Note  12/24/2015 9:12 AM Stacy Chambers  MRN:  [id number]  Subjective:    Patient is a 50 year old female with history of bipolar disorder who presented for the follow-up appointment. She reported that she has been very upset as she has been gaining weight on Risperdal. She reported that the medication helped with her more swings and anger but she has gained significant amount of weight.  Patient reported that she stays at home and continues to have 'pissy'attitude with her family members. She reported that she is trying to eat healthy but it is not helping. We discussed about her medications in detail. She reported that she had restless legs and Ge[place] and the Risperdal is helping her but she wants to lose weight as well. She appeared open to discussion about her medications. She reported that she is sleeping well at night. She currently denied having any paranoia at this time. She reported that she does not enjoy baking or cooking at home. She reported that she is upset due to the death anniversary of her deceased son during this week. She is trying to control her anger and depressive symptoms. She denied having any use of drugs or alcohol at this time.  She appeared calm and cooperative during the interview.    Chief Complaint:  Chief Complaint    Medication Problem; Follow-up; Weight Gain     Visit Diagnosis:     ICD-9-CM ICD-10-CM   1. Bipolar affective disorder, current episode hypomanic ([place]) 296.40 F31.0     Past Medical History:  Past Medical History  Diagnosis Date  . RLS (restless legs syndrome)   . Bipolar disorder ([place])   . Hyperlipidemia   . Elevated liver function tests 11/26/2004    H/O NEGATIVE LIVER BIOPSY, workup negative  . Restless legs syndrome (RLS)   . Anxiety   . Breast mass 01/2015    left breast 11 oc  . Cancer ([place]) 1995    uterine  . Ovarian cancer ([place]) 1995  . Osteoarthritis     Past Surgical History  Procedure Laterality Date   . Abdominal hysterectomy    . Oophorectomy    . Cesarean section    . Liver biopsy  2005    PerPt: Done to eval elevated LFTs and biopsy provided no definite dx/cause of elevated LFTs   Family History:  Family History  Problem Relation Age of Onset  . Adopted: Yes  . COPD Mother   . Depression Mother   . Colon cancer Mother     unsure of age  . Cancer Father     Lung CA, Skin Cancer--?type  . COPD Father   . Stroke Father   . Cancer Sister 4    cervical cancer  . COPD Brother   . Alcohol abuse Brother   . Stroke Brother   . COPD Maternal Aunt   . Depression Daughter   . Schizophrenia Son    Social History:  Social History   Social History  . Marital Status: Married    Spouse Name: N/A  . Number of Children: N/A  . Years of Education: N/A   Social History Main Topics  . Smoking status: Former Smoker    Types: Cigarettes    Quit date: 04/08/2009  . Smokeless tobacco: Never Used  . Alcohol Use: 1.8 - 4.2 oz/week    0 Glasses of wine, 0 Cans of beer, 0 Standard drinks or equivalent, 3-7 Shots of liquor per week  Comment: occasionally  . Drug Use: Yes    Special: Marijuana     Comment: last used last night  . Sexual Activity: Yes    Birth Control/ Protection: Surgical   Other Topics Concern  . None   Social History Narrative   Additional History:   Lives with her husband.  Assessment:   Musculoskeletal: Strength & Muscle Tone: within normal limits Gait & Station: normal Patient leans: N/A  Psychiatric Specialty Exam: Anxiety Symptoms include nervous/anxious behavior. Patient reports no insomnia or palpitations.            Associated symptoms include does not have insomnia.  Past medical history includes anxiety.     Review of Systems  Constitutional: Negative for chills and malaise/fatigue.  HENT: Negative for nosebleeds and tinnitus.   Eyes: Negative for photophobia.  Respiratory: Negative for hemoptysis.   Cardiovascular: Negative for  palpitations.  Gastrointestinal: Negative for vomiting.  Genitourinary: Negative for urgency.  Musculoskeletal: Positive for back pain and joint pain. Negative for neck pain.  Skin: Negative for itching.  Neurological: Negative for tremors.  Endo/Heme/Allergies: Negative for environmental allergies.  Psychiatric/Behavioral: Positive for depression. Negative for hallucinations. The patient is nervous/anxious. The patient does not have insomnia.     Blood pressure 122/76, pulse 75, temperature 97 F (36.1 C), temperature source Tympanic, height 5\' 4"  (1.626 m), weight 179 lb 12.8 oz (81.557 kg), SpO2 98 %.Body mass index is 30.85 kg/(m^2).  General Appearance: Neat  Eye Contact:  Fair  Speech:  Normal Rate  Volume:  Normal  Mood:  Anxious  Affect:  Congruent  Thought Process:  Goal Directed  Orientation:  Full (Time, Place, and Person)  Thought Content:  WDL  Suicidal Thoughts:  No  Homicidal Thoughts:  No  Memory:  Immediate;   Fair  Judgement:  Fair  Insight:  Fair  Psychomotor Activity:  Normal  Concentration:  Fair  Recall:  [employer] of Knowledge: Fair  Language: Fair  Akathisia:  No  Handed:  Right  AIMS (if indicated):  none  Assets:  Communication Skills Desire for Improvement Social Support  ADL's:  Intact  Cognition: WNL  Sleep:  6-7    Is the patient at risk to self?  No. Has the patient been a risk to self in the past 6 months?  No. Has the patient been a risk to self within the distant past?  No. Is the patient a risk to others?  No. Has the patient been a risk to others in the past 6 months?  No. Has the patient been a risk to others within the distant past?  No.  Current Medications: Current Outpatient Prescriptions  Medication Sig Dispense Refill  . alendronate (FOSAMAX) 70 MG tablet Take 1 tablet (70 mg total) by mouth once a week. Take with a full glass of water on an empty stomach. 12 tablet 3  . benztropine (COGENTIN) 0.5 MG tablet TAKE 1 TABLET  AT BEDTIME (SUBSTITUTED FOR  COGENTIN) 60 tablet 1  . busPIRone (BUSPAR) 10 MG tablet Take 1 tablet (10 mg total) by mouth 2 (two) times daily. 180 tablet 2  . fexofenadine-pseudoephedrine (ALLEGRA-D 24) 180-240 MG 24 hr tablet Take 1 tablet by mouth daily as needed. 90 tablet 0  . hydrochlorothiazide (HYDRODIURIL) 12.5 MG tablet Take 12.5 mg by mouth daily.  2  . lubiprostone (AMITIZA) 24 MCG capsule Take 1 capsule (24 mcg total) by mouth 2 (two) times daily with a meal. 180 capsule 3  .  Melatonin 5 MG TABS Take 1 tablet by mouth at bedtime as needed.    . meloxicam (MOBIC) 7.5 MG tablet TAKE 1 TABLET EVERY DAY 30 tablet 6  . risperiDONE (RISPERDAL) 1 MG tablet Take 1 tablet (1 mg total) by mouth at bedtime. 90 tablet 1  . rosuvastatin (CRESTOR) 10 MG tablet TAKE 1 TABLET EVERY DAY 90 tablet 0  . sertraline (ZOLOFT) 50 MG tablet Take 1 tablet (50 mg total) by mouth daily. 90 tablet 1   No current facility-administered medications for this visit.    Medical Decision Making:  Established Problem, Stable/Improving (1), Review and summation of old records (2) and Review or order medicine tests (1)  Treatment Plan Summary:Medication management   Discussed with patient about the medications and will decrease the dose of risperidone to 0.5 mg at bedtime. It will  help with her agitation and behavioral problems. Discontinue sertraline I will refill her medications for 90 day supply through [place] She will also continue on BuSpar 10 mg twice a day for anxiety I will start her on Topamax 50 mg at bedtime to help with her weight loss and mood swings and she demonstrated understanding. Discussed with her about the adverse effects of the medication in detail. She takes Klonopin on a when necessary basis  She will follow-up in 3 months     More than 50% of the time spent in psychoeducation, counseling and coordination of care. Time spent with the patient 25 minutes    This note was  generated in part or whole with voice recognition software. Voice regonition is usually quite accurate but there are transcription errors that can and very often do occur. I apologize for any typographical errors that were not detected and corrected.    Rainey Pines, MD  12/24/2015, 9:12 AM

## 2015-12-25 NOTE — Telephone Encounter (Signed)
pt was seen on  12-24-15

## 2015-12-31 ENCOUNTER — Telehealth: Payer: Self-pay | Admitting: Psychiatry

## 2015-12-31 NOTE — Telephone Encounter (Signed)
Pt becoming agitated and does not need to be on sertraline. Discussed with pt during the appointment. Continue meds as prescribed.

## 2016-01-02 ENCOUNTER — Encounter: Payer: Self-pay | Admitting: Physician Assistant

## 2016-01-07 ENCOUNTER — Ambulatory Visit: Payer: Self-pay | Admitting: Psychiatry

## 2016-01-13 ENCOUNTER — Telehealth: Payer: Self-pay | Admitting: Psychiatry

## 2016-01-13 NOTE — Telephone Encounter (Signed)
done

## 2016-01-16 ENCOUNTER — Ambulatory Visit (INDEPENDENT_AMBULATORY_CARE_PROVIDER_SITE_OTHER): Payer: 59 | Admitting: Psychiatry

## 2016-01-16 ENCOUNTER — Encounter: Payer: Self-pay | Admitting: Psychiatry

## 2016-01-16 VITALS — BP 118/78 | HR 92 | Temp 98.7°F | Ht 64.0 in | Wt 185.4 lb

## 2016-01-16 DIAGNOSIS — F3160 Bipolar disorder, current episode mixed, unspecified: Secondary | ICD-10-CM

## 2016-01-16 MED ORDER — CLONAZEPAM 0.5 MG PO TABS: 0.2500 mg | ORAL_TABLET | Freq: Two times a day (BID) | ORAL | Status: DC

## 2016-01-16 MED ORDER — SERTRALINE HCL 50 MG PO TABS: 50.0000 mg | ORAL_TABLET | Freq: Every day | ORAL | Status: DC

## 2016-01-16 MED ORDER — ZIPRASIDONE HCL 40 MG PO CAPS: 40.0000 mg | ORAL_CAPSULE | Freq: Every day | ORAL | Status: DC

## 2016-01-16 NOTE — Progress Notes (Signed)
BH MD/PA/NP OP Progress Note  01/16/2016 11:10 AM Stacy Chambers  MRN:  [id number]  Subjective:    Patient is a 50 year old female with history of bipolar disorder who presented for the follow-up appointment. She she was upset and tearful during the interview as she reported that she has been gaining weight on her medications and her depressive symptoms are not under control. She reported that she has been trying with her medications but they're not helpful. She reported that her Zoloft was discontinued at her previous appointment and she does not recall the reason we discussed about her medications. She reported that she wanted to his restart taking the Geodon again as she did well on the Geodon in the past. Patient reported that she also has anxiety attacks and they get worse. Patient currently denied having any suicidal ideation or plan. She reported that she also did well on Saphris in the past but she has gained a lot of weight on the Saphris and she does not want to try that again. Patient stated that she has been sleeping well at night. She denied having any paranoia. She reported that she wants to start riding her bike again as the weather is changing and is looking forward to the same. She reported that her husband is very supportive and he wants her to get stable on the medications.  Chief Complaint:  Chief Complaint    Follow-up; Medication Refill; Weight Gain; Drug Problem     Visit Diagnosis:     ICD-9-CM ICD-10-CM   1. Bipolar I disorder, most recent episode mixed ([place]) 296.60 F31.60     Past Medical History:  Past Medical History  Diagnosis Date  . RLS (restless legs syndrome)   . Bipolar disorder ([place])   . Hyperlipidemia   . Elevated liver function tests 11/26/2004    H/O NEGATIVE LIVER BIOPSY, workup negative  . Restless legs syndrome (RLS)   . Anxiety   . Breast mass 01/2015    left breast 11 oc  . Cancer ([place]) 1995    uterine  . Ovarian cancer ([place]) 1995  .  Osteoarthritis     Past Surgical History  Procedure Laterality Date  . Abdominal hysterectomy    . Oophorectomy    . Cesarean section    . Liver biopsy  2005    PerPt: Done to eval elevated LFTs and biopsy provided no definite dx/cause of elevated LFTs   Family History:  Family History  Problem Relation Age of Onset  . Adopted: Yes  . COPD Mother   . Depression Mother   . Colon cancer Mother     unsure of age  . Cancer Father     Lung CA, Skin Cancer--?type  . COPD Father   . Stroke Father   . Cancer Sister 102    cervical cancer  . COPD Brother   . Alcohol abuse Brother   . Stroke Brother   . COPD Maternal Aunt   . Depression Daughter   . Schizophrenia Son    Social History:  Social History   Social History  . Marital Status: Married    Spouse Name: N/A  . Number of Children: N/A  . Years of Education: N/A   Social History Main Topics  . Smoking status: Former Smoker    Types: Cigarettes    Quit date: 04/08/2009  . Smokeless tobacco: Never Used  . Alcohol Use: 1.8 - 4.2 oz/week    0 Glasses of wine, 0 Cans of  beer, 0 Standard drinks or equivalent, 3-7 Shots of liquor per week     Comment: occasionally  . Drug Use: Yes    Special: Marijuana     Comment: last used last night  . Sexual Activity: Yes    Birth Control/ Protection: Surgical   Other Topics Concern  . None   Social History Narrative   Additional History:   Lives with her husband.  Assessment:   Musculoskeletal: Strength & Muscle Tone: within normal limits Gait & Station: normal Patient leans: N/A  Psychiatric Specialty Exam: Drug Problem Pertinent negatives include no hallucinations. Pertinent negatives include no vomiting.  Anxiety Symptoms include nervous/anxious behavior. Patient reports no insomnia or palpitations.            Associated symptoms include does not have insomnia.  Past medical history includes anxiety.     Review of Systems  Constitutional: Negative for chills  and malaise/fatigue.  HENT: Negative for nosebleeds and tinnitus.   Eyes: Negative for photophobia.  Respiratory: Negative for hemoptysis.   Cardiovascular: Negative for palpitations.  Gastrointestinal: Negative for vomiting.  Genitourinary: Negative for urgency.  Musculoskeletal: Positive for back pain and joint pain. Negative for neck pain.  Skin: Negative for itching.  Neurological: Negative for tremors.  Endo/Heme/Allergies: Negative for environmental allergies.  Psychiatric/Behavioral: Positive for depression. Negative for hallucinations. The patient is nervous/anxious. The patient does not have insomnia.     Blood pressure 118/78, pulse 92, temperature 98.7 F (37.1 C), temperature source Tympanic, height 5\' 4"  (1.626 m), weight 185 lb 6.4 oz (84.097 kg), SpO2 98 %.Body mass index is 31.81 kg/(m^2).  General Appearance: Neat  Eye Contact:  Fair  Speech:  Normal Rate  Volume:  Decreased  Mood:  Anxious  Affect:  Congruent  Thought Process:  Goal Directed  Orientation:  Full (Time, Place, and Person)  Thought Content:  WDL  Suicidal Thoughts:  No  Homicidal Thoughts:  No  Memory:  Immediate;   Fair  Judgement:  Fair  Insight:  Fair  Psychomotor Activity:  Normal  Concentration:  Fair  Recall:  [employer] of Knowledge: Fair  Language: Fair  Akathisia:  No  Handed:  Right  AIMS (if indicated):  none  Assets:  Communication Skills Desire for Improvement Social Support  ADL's:  Intact  Cognition: WNL  Sleep:  6-7    Is the patient at risk to self?  No. Has the patient been a risk to self in the past 6 months?  No. Has the patient been a risk to self within the distant past?  No. Is the patient a risk to others?  No. Has the patient been a risk to others in the past 6 months?  No. Has the patient been a risk to others within the distant past?  No.  Current Medications: Current Outpatient Prescriptions  Medication Sig Dispense Refill  . alendronate (FOSAMAX) 70 MG  tablet Take 1 tablet (70 mg total) by mouth once a week. Take with a full glass of water on an empty stomach. 12 tablet 3  . fexofenadine-pseudoephedrine (ALLEGRA-D 24) 180-240 MG 24 hr tablet Take 1 tablet by mouth daily as needed. 90 tablet 0  . hydrochlorothiazide (HYDRODIURIL) 12.5 MG tablet Take 12.5 mg by mouth daily.  2  . lubiprostone (AMITIZA) 24 MCG capsule Take 1 capsule (24 mcg total) by mouth 2 (two) times daily with a meal. 180 capsule 3  . Melatonin 5 MG TABS Take 1 tablet by mouth at bedtime as  needed.    . meloxicam (MOBIC) 7.5 MG tablet TAKE 1 TABLET EVERY DAY 30 tablet 6  . rosuvastatin (CRESTOR) 10 MG tablet TAKE 1 TABLET EVERY DAY 90 tablet 0  . clonazePAM (KLONOPIN) 0.5 MG tablet Take 0.5 tablets (0.25 mg total) by mouth 2 (two) times daily. 30 tablet 1  . sertraline (ZOLOFT) 50 MG tablet Take 1 tablet (50 mg total) by mouth daily. 30 tablet 1  . ziprasidone (GEODON) 40 MG capsule Take 1 capsule (40 mg total) by mouth at bedtime. 30 capsule 1   No current facility-administered medications for this visit.    Medical Decision Making:  Established Problem, Stable/Improving (1), Review and summation of old records (2) and Review or order medicine tests (1)  Treatment Plan Summary:Medication management   Discussed with patient about the medications andWill make changes to her medications as follows. I will discontinue the Risperdal and Topamax She will start Geodon 40 mg by mouth every afternoon with food She will also start taking sertraline 50 ng in the morning Patient will be given Klonopin 0.25 mg by mouth twice a day for her anxiety symptoms Follow-up in  4 weeks      More than 50% of the time spent in psychoeducation, counseling and coordination of care. Time spent with the patient 25 minutes    This note was generated in part or whole with voice recognition software. Voice regonition is usually quite accurate but there are transcription errors that can and very  often do occur. I apologize for any typographical errors that were not detected and corrected.    Rainey Pines, MD  01/16/2016, 11:10 AM

## 2016-01-22 ENCOUNTER — Telehealth: Payer: Self-pay

## 2016-01-22 NOTE — Telephone Encounter (Signed)
Meds not sent to mail order pharmacy. Advise pt to continue her meds as pescribed.

## 2016-01-22 NOTE — Telephone Encounter (Signed)
PT CALLED STATES THAT SHE STILL HAS NOT RECEIVED HER MEDICATIONS THREW MAIL ORDER AND SHE WANTS YOU TO SEND HER A REFILL ON ALL HER MEDICATIONS TO THE [facility].  PT ALSO STATES THAT THE KLONOPIN IS NOT ENOUGH, AND THAT SHE STILL HAVING WEIGHT GAIN WHICH IS CAUSING HER DEPRESSION TO BE WORSE.

## 2016-02-18 ENCOUNTER — Encounter: Payer: Self-pay | Admitting: Psychiatry

## 2016-02-18 ENCOUNTER — Ambulatory Visit (INDEPENDENT_AMBULATORY_CARE_PROVIDER_SITE_OTHER): Payer: Medicare PPO | Admitting: Psychiatry

## 2016-02-18 VITALS — BP 104/72 | HR 102 | Temp 98.2°F | Ht 64.0 in | Wt 178.6 lb

## 2016-02-18 DIAGNOSIS — F3160 Bipolar disorder, current episode mixed, unspecified: Secondary | ICD-10-CM | POA: Diagnosis not present

## 2016-02-18 MED ORDER — CLONAZEPAM 0.5 MG PO TABS: 0.5000 mg | ORAL_TABLET | Freq: Two times a day (BID) | ORAL | Status: DC

## 2016-02-18 MED ORDER — SERTRALINE HCL 50 MG PO TABS: 50.0000 mg | ORAL_TABLET | Freq: Every day | ORAL | Status: DC

## 2016-02-18 MED ORDER — ZIPRASIDONE HCL 40 MG PO CAPS: 40.0000 mg | ORAL_CAPSULE | Freq: Every day | ORAL | Status: DC

## 2016-02-18 NOTE — Progress Notes (Signed)
BH MD/PA/NP OP Progress Note  02/18/2016 10:04 AM Stacy Chambers  MRN:  [id number]  Subjective:    Patient is a 50 year old female with history of bipolar disorder who presented for the follow-up appointment. She reported that she was upset yesterday as she heard about some news on the television. However she is excited that she has been losing weight. She reported that she has lost 7 pounds now. She is using some water pills over-the-counter. She reported that she drinks a lot of water as well. She currently denied having any adverse effects of her medications. She appeared calm and collected during the interview. She was discussing about her previous psychotropic medications in detail. She reported that she wants to go higher on the dose of her Klonopin. She reported that it helps with her restless legs. She sleeps well with the same. She denied having any perceptual disturbances. She denied having any more swings anger anxiety at this time. In forward for the weather to change so she can start driving her motor bike again.   Chief Complaint:  Chief Complaint    Follow-up; Medication Refill     Visit Diagnosis:     ICD-9-CM ICD-10-CM   1. Bipolar I disorder, most recent episode mixed ([place]) 296.60 F31.60     Past Medical History:  Past Medical History  Diagnosis Date  . RLS (restless legs syndrome)   . Bipolar disorder ([place])   . Hyperlipidemia   . Elevated liver function tests 11/26/2004    H/O NEGATIVE LIVER BIOPSY, workup negative  . Restless legs syndrome (RLS)   . Anxiety   . Breast mass 01/2015    left breast 11 oc  . Cancer ([place]) 1995    uterine  . Ovarian cancer ([place]) 1995  . Osteoarthritis     Past Surgical History  Procedure Laterality Date  . Abdominal hysterectomy    . Oophorectomy    . Cesarean section    . Liver biopsy  2005    PerPt: Done to eval elevated LFTs and biopsy provided no definite dx/cause of elevated LFTs   Family History:  Family History   Problem Relation Age of Onset  . Adopted: Yes  . COPD Mother   . Depression Mother   . Colon cancer Mother     unsure of age  . Cancer Father     Lung CA, Skin Cancer--?type  . COPD Father   . Stroke Father   . Cancer Sister 87    cervical cancer  . COPD Brother   . Alcohol abuse Brother   . Stroke Brother   . COPD Maternal Aunt   . Depression Daughter   . Schizophrenia Son    Social History:  Social History   Social History  . Marital Status: Married    Spouse Name: N/A  . Number of Children: N/A  . Years of Education: N/A   Social History Main Topics  . Smoking status: Former Smoker    Types: Cigarettes    Quit date: 04/08/2009  . Smokeless tobacco: Never Used  . Alcohol Use: 1.8 - 4.2 oz/week    0 Glasses of wine, 0 Cans of beer, 3-7 Shots of liquor, 0 Standard drinks or equivalent per week     Comment: occasionally  . Drug Use: Yes    Special: Marijuana     Comment: last used last 02-17-16  . Sexual Activity: Yes    Birth Control/ Protection: Surgical   Other Topics Concern  . None  Social History Narrative   Additional History:   Lives with her husband.  Assessment:   Musculoskeletal: Strength & Muscle Tone: within normal limits Gait & Station: normal Patient leans: N/A  Psychiatric Specialty Exam: Drug Problem Pertinent negatives include no hallucinations. Pertinent negatives include no vomiting.  Anxiety Patient reports no insomnia or palpitations.            Associated symptoms include does not have insomnia.  Past medical history includes anxiety.     Review of Systems  Constitutional: Negative for chills and malaise/fatigue.  HENT: Negative for nosebleeds and tinnitus.   Eyes: Negative for photophobia.  Respiratory: Negative for hemoptysis.   Cardiovascular: Negative for palpitations.  Gastrointestinal: Negative for vomiting.  Genitourinary: Negative for urgency.  Musculoskeletal: Positive for back pain and joint pain. Negative  for neck pain.  Skin: Negative for itching.  Neurological: Negative for tremors.  Endo/Heme/Allergies: Negative for environmental allergies.  Psychiatric/Behavioral: Negative for hallucinations. The patient does not have insomnia.     Blood pressure 104/72, pulse 102, temperature 98.2 F (36.8 C), temperature source Tympanic, height 5\' 4"  (1.626 m), weight 178 lb 9.6 oz (81.012 kg), SpO2 98 %.Body mass index is 30.64 kg/(m^2).  General Appearance: Neat  Eye Contact:  Fair  Speech:  Normal Rate  Volume:  Decreased  Mood:  Anxious  Affect:  Congruent  Thought Process:  Goal Directed  Orientation:  Full (Time, Place, and Person)  Thought Content:  WDL  Suicidal Thoughts:  No  Homicidal Thoughts:  No  Memory:  Immediate;   Fair  Judgement:  Fair  Insight:  Fair  Psychomotor Activity:  Normal  Concentration:  Fair  Recall:  [employer] of Knowledge: Fair  Language: Fair  Akathisia:  No  Handed:  Right  AIMS (if indicated):  none  Assets:  Communication Skills Desire for Improvement Social Support  ADL's:  Intact  Cognition: WNL  Sleep:  6-7    Is the patient at risk to self?  No. Has the patient been a risk to self in the past 6 months?  No. Has the patient been a risk to self within the distant past?  No. Is the patient a risk to others?  No. Has the patient been a risk to others in the past 6 months?  No. Has the patient been a risk to others within the distant past?  No.  Current Medications: Current Outpatient Prescriptions  Medication Sig Dispense Refill  . alendronate (FOSAMAX) 70 MG tablet Take 1 tablet (70 mg total) by mouth once a week. Take with a full glass of water on an empty stomach. 12 tablet 3  . clonazePAM (KLONOPIN) 0.5 MG tablet Take 0.5 tablets (0.25 mg total) by mouth 2 (two) times daily. 30 tablet 1  . hydrochlorothiazide (HYDRODIURIL) 12.5 MG tablet Take 12.5 mg by mouth daily.  2  . lubiprostone (AMITIZA) 24 MCG capsule Take 1 capsule (24 mcg total)  by mouth 2 (two) times daily with a meal. 180 capsule 3  . Melatonin 5 MG TABS Take 1 tablet by mouth at bedtime as needed.    . meloxicam (MOBIC) 7.5 MG tablet TAKE 1 TABLET EVERY DAY 30 tablet 6  . phentermine (ADIPEX-P) 37.5 MG tablet Take 37.5 mg by mouth every morning.  2  . rosuvastatin (CRESTOR) 10 MG tablet TAKE 1 TABLET EVERY DAY 90 tablet 0  . sertraline (ZOLOFT) 50 MG tablet Take 1 tablet (50 mg total) by mouth daily. 30 tablet 1  .  ziprasidone (GEODON) 40 MG capsule Take 1 capsule (40 mg total) by mouth at bedtime. 30 capsule 1   No current facility-administered medications for this visit.    Medical Decision Making:  Established Problem, Stable/Improving (1), Review and summation of old records (2) and Review or order medicine tests (1)  Treatment Plan Summary:Medication management   Discussed with patient about the medications andWill make changes to her medications as follows. Continue  Geodon 40 mg by mouth every afternoon with food Continue  sertraline 50 ng in the morning Patient will be given Klonopin 0.5  mg by mouth twice a day for her anxiety symptoms Follow-up in  4 weeks      More than 50% of the time spent in psychoeducation, counseling and coordination of care. Time spent with the patient 25 minutes    This note was generated in part or whole with voice recognition software. Voice regonition is usually quite accurate but there are transcription errors that can and very often do occur. I apologize for any typographical errors that were not detected and corrected.    Rainey Pines, MD  02/18/2016, 10:04 AM

## 2016-02-28 ENCOUNTER — Other Ambulatory Visit: Payer: Self-pay | Admitting: Psychiatry

## 2016-03-09 ENCOUNTER — Ambulatory Visit (INDEPENDENT_AMBULATORY_CARE_PROVIDER_SITE_OTHER): Payer: Medicare PPO | Admitting: Psychiatry

## 2016-03-09 ENCOUNTER — Encounter: Payer: Self-pay | Admitting: Psychiatry

## 2016-03-09 VITALS — BP 118/78 | HR 75 | Temp 97.2°F | Wt 175.8 lb

## 2016-03-09 DIAGNOSIS — F3160 Bipolar disorder, current episode mixed, unspecified: Secondary | ICD-10-CM

## 2016-03-09 MED ORDER — CLONAZEPAM 0.5 MG PO TABS: 0.5000 mg | ORAL_TABLET | Freq: Two times a day (BID) | ORAL | Status: DC

## 2016-03-09 MED ORDER — CARIPRAZINE HCL 1.5 MG PO CAPS: 1.5000 mg | ORAL_CAPSULE | Freq: Every day | ORAL | Status: DC

## 2016-03-09 NOTE — Progress Notes (Signed)
BH MD/PA/NP OP Progress Note  03/09/2016 8:44 AM [name]  MRN:  [id number]  Subjective:    Patient is a 50 year old female with history of bipolar disorder who presented for the follow-up appointment. She reported that she continues to have more swings and she has turned away everybody in her life. Patient reported that she is not improving on her medications. She spent the mother stay by herself as her daughter did not come to see her. Patient was upset during the interview. She reported that she is trying to lose weight and was recently started on phentermine by her primary care physician. She reported that the Zoloft is making her upset and she wants to have her medications adjusted. We discussed about the new medication and she agreed with the plan. She currently denied having any suicidal ideations or plans. She denied using any drugs or alcohol at this time.      Chief Complaint:  Chief Complaint    Follow-up; Medication Refill; Depression; Medication Problem     Visit Diagnosis:     ICD-9-CM ICD-10-CM   1. Bipolar I disorder, most recent episode mixed ([place]) 296.60 F31.60     Past Medical History:  Past Medical History  Diagnosis Date  . RLS (restless legs syndrome)   . Bipolar disorder ([place])   . Hyperlipidemia   . Elevated liver function tests 11/26/2004    H/O NEGATIVE LIVER BIOPSY, workup negative  . Restless legs syndrome (RLS)   . Anxiety   . Breast mass 01/2015    left breast 11 oc  . Cancer ([place]) 1995    uterine  . Ovarian cancer ([place]) 1995  . Osteoarthritis     Past Surgical History  Procedure Laterality Date  . Abdominal hysterectomy    . Oophorectomy    . Cesarean section    . Liver biopsy  2005    PerPt: Done to eval elevated LFTs and biopsy provided no definite dx/cause of elevated LFTs   Family History:  Family History  Problem Relation Age of Onset  . Adopted: Yes  . COPD Mother   . Depression Mother   . Colon cancer Mother     unsure of  age  . Cancer Father     Lung CA, Skin Cancer--?type  . COPD Father   . Stroke Father   . Cancer Sister 61    cervical cancer  . COPD Brother   . Alcohol abuse Brother   . Stroke Brother   . COPD Maternal Aunt   . Depression Daughter   . Schizophrenia Son    Social History:  Social History   Social History  . Marital Status: Married    Spouse Name: N/A  . Number of Children: N/A  . Years of Education: N/A   Social History Main Topics  . Smoking status: Former Smoker    Types: Cigarettes    Quit date: 04/08/2009  . Smokeless tobacco: Never Used  . Alcohol Use: 1.8 - 4.2 oz/week    0 Glasses of wine, 0 Cans of beer, 3-7 Shots of liquor, 0 Standard drinks or equivalent per week     Comment: occasionally  . Drug Use: Yes    Special: Marijuana     Comment: last used last 02-17-16  . Sexual Activity: Yes    Birth Control/ Protection: Surgical   Other Topics Concern  . None   Social History Narrative   Additional History:   Lives with her husband.  Assessment:  Musculoskeletal: Strength & Muscle Tone: within normal limits Gait & Station: normal Patient leans: N/A  Psychiatric Specialty Exam: Depression        Associated symptoms include does not have insomnia.  Past medical history includes anxiety.   Drug Problem Pertinent negatives include no hallucinations. Pertinent negatives include no vomiting.  Anxiety Patient reports no insomnia or palpitations.      Review of Systems  Constitutional: Negative for chills and malaise/fatigue.  HENT: Negative for nosebleeds and tinnitus.   Eyes: Negative for photophobia.  Respiratory: Negative for hemoptysis.   Cardiovascular: Negative for palpitations.  Gastrointestinal: Negative for vomiting.  Genitourinary: Negative for urgency.  Musculoskeletal: Positive for back pain and joint pain. Negative for neck pain.  Skin: Negative for itching.  Neurological: Negative for tremors.  Endo/Heme/Allergies: Negative for  environmental allergies.  Psychiatric/Behavioral: Positive for depression. Negative for hallucinations. The patient does not have insomnia.     Blood pressure 118/78, pulse 75, temperature 97.2 F (36.2 C), temperature source Tympanic, weight 175 lb 12.8 oz (79.742 kg), SpO2 96 %.Body mass index is 30.16 kg/(m^2).  General Appearance: Neat  Eye Contact:  Fair  Speech:  Normal Rate  Volume:  Decreased  Mood:  Anxious  Affect:  Congruent  Thought Process:  Goal Directed  Orientation:  Full (Time, Place, and Person)  Thought Content:  WDL  Suicidal Thoughts:  No  Homicidal Thoughts:  No  Memory:  Immediate;   Fair  Judgement:  Fair  Insight:  Fair  Psychomotor Activity:  Normal  Concentration:  Fair  Recall:  [employer] of Knowledge: Fair  Language: Fair  Akathisia:  No  Handed:  Right  AIMS (if indicated):  none  Assets:  Communication Skills Desire for Improvement Social Support  ADL's:  Intact  Cognition: WNL  Sleep:  6-7    Is the patient at risk to self?  No. Has the patient been a risk to self in the past 6 months?  No. Has the patient been a risk to self within the distant past?  No. Is the patient a risk to others?  No. Has the patient been a risk to others in the past 6 months?  No. Has the patient been a risk to others within the distant past?  No.  Current Medications: Current Outpatient Prescriptions  Medication Sig Dispense Refill  . alendronate (FOSAMAX) 70 MG tablet Take 1 tablet (70 mg total) by mouth once a week. Take with a full glass of water on an empty stomach. 12 tablet 3  . clonazePAM (KLONOPIN) 0.5 MG tablet Take 1 tablet (0.5 mg total) by mouth 2 (two) times daily. 60 tablet 1  . hydrochlorothiazide (HYDRODIURIL) 12.5 MG tablet Take 12.5 mg by mouth daily.  2  . lubiprostone (AMITIZA) 24 MCG capsule Take 1 capsule (24 mcg total) by mouth 2 (two) times daily with a meal. 180 capsule 3  . Melatonin 5 MG TABS Take 1 tablet by mouth at bedtime as  needed.    . meloxicam (MOBIC) 7.5 MG tablet TAKE 1 TABLET EVERY DAY 30 tablet 6  . phentermine (ADIPEX-P) 37.5 MG tablet Take 37.5 mg by mouth every morning.  2  . rosuvastatin (CRESTOR) 10 MG tablet TAKE 1 TABLET EVERY DAY 90 tablet 0  . sertraline (ZOLOFT) 50 MG tablet Take 1 tablet (50 mg total) by mouth daily. 30 tablet 1  . ziprasidone (GEODON) 40 MG capsule Take 1 capsule (40 mg total) by mouth at bedtime. 30 capsule 1  No current facility-administered medications for this visit.    Medical Decision Making:  Established Problem, Stable/Improving (1), Review and summation of old records (2) and Review or order medicine tests (1)  Treatment Plan Summary:Medication management   Discussed with patient about the medications and Will make changes to her medications as follows. Discontinue  Geodon Discontinue  sertraline  Patient will be given Klonopin 0.5  mg by mouth twice a day for her anxiety symptoms I will start her on Vraylar 1.5 mg po qdaily x 2 weeks and she was given samples of the medications. Discussed with her about the side effects of the medication detail and she demonstrated understanding. Follow-up in  2  weeks      More than 50% of the time spent in psychoeducation, counseling and coordination of care. Time spent with the patient 25 minutes    This note was generated in part or whole with voice recognition software. Voice regonition is usually quite accurate but there are transcription errors that can and very often do occur. I apologize for any typographical errors that were not detected and corrected.    Rainey Pines, MD  03/09/2016, 8:44 AM

## 2016-03-19 ENCOUNTER — Ambulatory Visit: Payer: Medicare PPO | Admitting: Psychiatry

## 2016-03-24 ENCOUNTER — Ambulatory Visit (INDEPENDENT_AMBULATORY_CARE_PROVIDER_SITE_OTHER): Payer: Medicare PPO | Admitting: Psychiatry

## 2016-03-24 ENCOUNTER — Encounter: Payer: Self-pay | Admitting: Psychiatry

## 2016-03-24 VITALS — BP 124/78 | HR 78 | Temp 97.3°F | Ht 64.0 in | Wt 173.4 lb

## 2016-03-24 DIAGNOSIS — F3160 Bipolar disorder, current episode mixed, unspecified: Secondary | ICD-10-CM | POA: Diagnosis not present

## 2016-03-24 MED ORDER — BENZTROPINE MESYLATE 1 MG PO TABS: 1.0000 mg | ORAL_TABLET | Freq: Every day | ORAL | Status: DC

## 2016-03-24 MED ORDER — ZIPRASIDONE HCL 60 MG PO CAPS: 60.0000 mg | ORAL_CAPSULE | Freq: Every evening | ORAL | Status: DC

## 2016-03-24 NOTE — Progress Notes (Signed)
BH MD/PA/NP OP Progress Note  03/24/2016 8:40 AM Stacy Chambers  MRN:  [id number]  Subjective:    Patient is a 50 year old female with history of bipolar disorder who presented for the follow-up appointment. She reported that she continues to have mood swings and she never started the vraylar as she read about the side effects. She continued the Geodon and wants to go higher on the dose of medication as she continue to feel agitated. Patient reported that she is not taking the Adipex anymore. She currently denied having any mood swings anger or anxiety. She appeared somewhat calm during the interview. She reported that she has also started riding her bike at this time. She denied having any paranoia.     Chief Complaint:  Chief Complaint    Follow-up; Medication Refill     Visit Diagnosis:     ICD-9-CM ICD-10-CM   1. Bipolar I disorder, most recent episode mixed ([place]) 296.60 F31.60     Past Medical History:  Past Medical History  Diagnosis Date  . RLS (restless legs syndrome)   . Bipolar disorder ([place])   . Hyperlipidemia   . Elevated liver function tests 11/26/2004    H/O NEGATIVE LIVER BIOPSY, workup negative  . Restless legs syndrome (RLS)   . Anxiety   . Breast mass 01/2015    left breast 11 oc  . Cancer ([place]) 1995    uterine  . Ovarian cancer ([place]) 1995  . Osteoarthritis     Past Surgical History  Procedure Laterality Date  . Abdominal hysterectomy    . Oophorectomy    . Cesarean section    . Liver biopsy  2005    PerPt: Done to eval elevated LFTs and biopsy provided no definite dx/cause of elevated LFTs   Family History:  Family History  Problem Relation Age of Onset  . Adopted: Yes  . COPD Mother   . Depression Mother   . Colon cancer Mother     unsure of age  . Cancer Father     Lung CA, Skin Cancer--?type  . COPD Father   . Stroke Father   . Cancer Sister 52    cervical cancer  . COPD Brother   . Alcohol abuse Brother   . Stroke Brother   . COPD  Maternal Aunt   . Depression Daughter   . Schizophrenia Son    Social History:  Social History   Social History  . Marital Status: Married    Spouse Name: N/A  . Number of Children: N/A  . Years of Education: N/A   Social History Main Topics  . Smoking status: Former Smoker    Types: Cigarettes    Quit date: 04/08/2009  . Smokeless tobacco: Never Used  . Alcohol Use: 1.8 - 4.2 oz/week    0 Glasses of wine, 0 Cans of beer, 3-7 Shots of liquor, 0 Standard drinks or equivalent per week     Comment: occasionally  . Drug Use: Yes    Special: Marijuana     Comment: last used last 02-17-16  . Sexual Activity: Yes    Birth Control/ Protection: Surgical   Other Topics Concern  . None   Social History Narrative   Additional History:   Lives with her husband.  Assessment:   Musculoskeletal: Strength & Muscle Tone: within normal limits Gait & Station: normal Patient leans: N/A  Psychiatric Specialty Exam: Depression        Associated symptoms include does not have insomnia.  Past medical history includes anxiety.   Drug Problem Pertinent negatives include no hallucinations. Pertinent negatives include no vomiting.  Anxiety Patient reports no insomnia or palpitations.      Review of Systems  Constitutional: Negative for chills and malaise/fatigue.  HENT: Negative for nosebleeds and tinnitus.   Eyes: Negative for photophobia.  Respiratory: Negative for hemoptysis.   Cardiovascular: Negative for palpitations.  Gastrointestinal: Negative for vomiting.  Genitourinary: Negative for urgency.  Musculoskeletal: Positive for back pain and joint pain. Negative for neck pain.  Skin: Negative for itching.  Neurological: Negative for tremors.  Endo/Heme/Allergies: Negative for environmental allergies.  Psychiatric/Behavioral: Positive for depression. Negative for hallucinations. The patient does not have insomnia.     Blood pressure 124/78, pulse 78, temperature 97.3 F (36.3  C), temperature source Tympanic, height 5\' 4"  (1.626 m), weight 173 lb 6.4 oz (78.654 kg), SpO2 98 %.Body mass index is 29.75 kg/(m^2).  General Appearance: Neat  Eye Contact:  Fair  Speech:  Normal Rate  Volume:  Normal  Mood:  Anxious  Affect:  Congruent  Thought Process:  Goal Directed  Orientation:  Full (Time, Place, and Person)  Thought Content:  WDL  Suicidal Thoughts:  No  Homicidal Thoughts:  No  Memory:  Immediate;   Fair  Judgement:  Fair  Insight:  Fair  Psychomotor Activity:  Normal  Concentration:  Fair  Recall:  [employer] of Knowledge: Fair  Language: Fair  Akathisia:  No  Handed:  Right  AIMS (if indicated):  none  Assets:  Communication Skills Desire for Improvement Social Support  ADL's:  Intact  Cognition: WNL  Sleep:  6-7    Is the patient at risk to self?  No. Has the patient been a risk to self in the past 6 months?  No. Has the patient been a risk to self within the distant past?  No. Is the patient a risk to others?  No. Has the patient been a risk to others in the past 6 months?  No. Has the patient been a risk to others within the distant past?  No.  Current Medications: Current Outpatient Prescriptions  Medication Sig Dispense Refill  . alendronate (FOSAMAX) 70 MG tablet Take 1 tablet (70 mg total) by mouth once a week. Take with a full glass of water on an empty stomach. 12 tablet 3  . Cariprazine HCl (VRAYLAR) 1.5 MG CAPS Take 1 capsule (1.5 mg total) by mouth daily after supper. 14 capsule 0  . clonazePAM (KLONOPIN) 0.5 MG tablet Take 1 tablet (0.5 mg total) by mouth 2 (two) times daily. 60 tablet 1  . hydrochlorothiazide (HYDRODIURIL) 12.5 MG tablet Take 12.5 mg by mouth daily.  2  . lubiprostone (AMITIZA) 24 MCG capsule Take 1 capsule (24 mcg total) by mouth 2 (two) times daily with a meal. 180 capsule 3  . Melatonin 5 MG TABS Take 1 tablet by mouth at bedtime as needed.    . meloxicam (MOBIC) 7.5 MG tablet TAKE 1 TABLET EVERY DAY 30  tablet 6  . rosuvastatin (CRESTOR) 10 MG tablet TAKE 1 TABLET EVERY DAY 90 tablet 0   No current facility-administered medications for this visit.    Medical Decision Making:  Established Problem, Stable/Improving (1), Review and summation of old records (2) and Review or order medicine tests (1)  Treatment Plan Summary:Medication management   Discussed with patient about the medications and Will make changes to her medications as follows. Continue  Geodon 60 mg po qhs  Patient has supply of Klonopin 0.5  mg by mouth twice a day for her anxiety symptoms D/c  Vraylar 1.5 mg po qdaily as she never started.  Follow-up in 4  weeks  More than 50% of the time spent in psychoeducation, counseling and coordination of care.    This note was generated in part or whole with voice recognition software. Voice regonition is usually quite accurate but there are transcription errors that can and very often do occur. I apologize for any typographical errors that were not detected and corrected.    Rainey Pines, MD  03/24/2016, 8:40 AM

## 2016-04-24 ENCOUNTER — Encounter: Payer: Self-pay | Admitting: Psychiatry

## 2016-04-24 ENCOUNTER — Telehealth: Payer: Self-pay

## 2016-04-24 ENCOUNTER — Ambulatory Visit (INDEPENDENT_AMBULATORY_CARE_PROVIDER_SITE_OTHER): Payer: Medicare PPO | Admitting: Psychiatry

## 2016-04-24 VITALS — BP 122/72 | HR 83 | Temp 97.8°F | Ht 64.0 in | Wt 168.2 lb

## 2016-04-24 DIAGNOSIS — F3160 Bipolar disorder, current episode mixed, unspecified: Secondary | ICD-10-CM

## 2016-04-24 MED ORDER — ZIPRASIDONE HCL 60 MG PO CAPS: 60.0000 mg | ORAL_CAPSULE | Freq: Every evening | ORAL | Status: DC

## 2016-04-24 MED ORDER — CLONAZEPAM 0.5 MG PO TABS: 0.5000 mg | ORAL_TABLET | Freq: Two times a day (BID) | ORAL | Status: DC

## 2016-04-24 NOTE — Telephone Encounter (Signed)
rx was faxed and confirmed for klonopin .5mg  id # [id number] order # [id number]

## 2016-04-24 NOTE — Progress Notes (Signed)
BH MD/PA/NP OP Progress Note  04/24/2016 8:38 AM Stacy Chambers  MRN:  [id number]  Subjective:    Patient is a 50 year old female with history of bipolar disorder who presented for the follow-up appointment. She reported that she lost 30 pounds in 3 months. She appeared calm and collected during the interview. She reported that the Geodon is helping her. She stated that she is not having any side effects of the medications at this time. Her mood symptoms are getting better She is having problems with sleep and has been taking melatonin to help with the same. She stated that her husband has also noticed improvement in her symptoms. She also takes Klonopin on a regular basis. She currently denied having any suicidal homicidal ideations or plans.  She has already stopped taking the Cogentin as she does not have any adverse reactions from the Calcium.   Chief Complaint:  Chief Complaint    Follow-up; Medication Refill     Visit Diagnosis:     ICD-9-CM ICD-10-CM   1. Bipolar I disorder, most recent episode mixed ([place]) 296.60 F31.60     Past Medical History:  Past Medical History  Diagnosis Date  . RLS (restless legs syndrome)   . Bipolar disorder ([place])   . Hyperlipidemia   . Elevated liver function tests 11/26/2004    H/O NEGATIVE LIVER BIOPSY, workup negative  . Restless legs syndrome (RLS)   . Anxiety   . Breast mass 01/2015    left breast 11 oc  . Cancer ([place]) 1995    uterine  . Ovarian cancer ([place]) 1995  . Osteoarthritis     Past Surgical History  Procedure Laterality Date  . Abdominal hysterectomy    . Oophorectomy    . Cesarean section    . Liver biopsy  2005    PerPt: Done to eval elevated LFTs and biopsy provided no definite dx/cause of elevated LFTs   Family History:  Family History  Problem Relation Age of Onset  . Adopted: Yes  . COPD Mother   . Depression Mother   . Colon cancer Mother     unsure of age  . Cancer Father     Lung CA, Skin Cancer--?type  .  COPD Father   . Stroke Father   . Cancer Sister 6    cervical cancer  . COPD Brother   . Alcohol abuse Brother   . Stroke Brother   . COPD Maternal Aunt   . Depression Daughter   . Schizophrenia Son    Social History:  Social History   Social History  . Marital Status: Married    Spouse Name: N/A  . Number of Children: N/A  . Years of Education: N/A   Social History Main Topics  . Smoking status: Former Smoker    Types: Cigarettes    Quit date: 04/08/2009  . Smokeless tobacco: Never Used  . Alcohol Use: 1.8 - 4.2 oz/week    0 Glasses of wine, 0 Cans of beer, 3-7 Shots of liquor, 0 Standard drinks or equivalent per week     Comment: occasionally  . Drug Use: Yes    Special: Marijuana     Comment: last used last 02-17-16  . Sexual Activity: Yes    Birth Control/ Protection: Surgical   Other Topics Concern  . None   Social History Narrative   Additional History:   Lives with her husband.  Assessment:   Musculoskeletal: Strength & Muscle Tone: within normal limits Gait & Station:  normal Patient leans: N/A  Psychiatric Specialty Exam: Depression        Associated symptoms include does not have insomnia.  Past medical history includes anxiety.   Drug Problem Pertinent negatives include no hallucinations. Pertinent negatives include no vomiting.  Anxiety Patient reports no insomnia or palpitations.      Review of Systems  Constitutional: Negative for chills and malaise/fatigue.  HENT: Negative for nosebleeds and tinnitus.   Eyes: Negative for photophobia.  Respiratory: Negative for hemoptysis.   Cardiovascular: Negative for palpitations.  Gastrointestinal: Negative for vomiting.  Genitourinary: Negative for urgency.  Musculoskeletal: Positive for back pain and joint pain. Negative for neck pain.  Skin: Negative for itching.  Neurological: Negative for tremors.  Endo/Heme/Allergies: Negative for environmental allergies.  Psychiatric/Behavioral: Positive  for depression. Negative for hallucinations. The patient does not have insomnia.     Blood pressure 122/72, pulse 83, temperature 97.8 F (36.6 C), temperature source Tympanic, height 5\' 4"  (1.626 m), weight 168 lb 3.2 oz (76.295 kg), SpO2 97 %.Body mass index is 28.86 kg/(m^2).  General Appearance: Neat  Eye Contact:  Fair  Speech:  Normal Rate  Volume:  Normal  Mood:  Anxious  Affect:  Congruent  Thought Process:  Goal Directed  Orientation:  Full (Time, Place, and Person)  Thought Content:  WDL  Suicidal Thoughts:  No  Homicidal Thoughts:  No  Memory:  Immediate;   Fair  Judgement:  Fair  Insight:  Fair  Psychomotor Activity:  Normal  Concentration:  Fair  Recall:  [employer] of Knowledge: Fair  Language: Fair  Akathisia:  No  Handed:  Right  AIMS (if indicated):  none  Assets:  Communication Skills Desire for Improvement Social Support  ADL's:  Intact  Cognition: WNL  Sleep:  6-7    Is the patient at risk to self?  No. Has the patient been a risk to self in the past 6 months?  No. Has the patient been a risk to self within the distant past?  No. Is the patient a risk to others?  No. Has the patient been a risk to others in the past 6 months?  No. Has the patient been a risk to others within the distant past?  No.  Current Medications: Current Outpatient Prescriptions  Medication Sig Dispense Refill  . alendronate (FOSAMAX) 70 MG tablet Take 1 tablet (70 mg total) by mouth once a week. Take with a full glass of water on an empty stomach. 12 tablet 3  . benztropine (COGENTIN) 1 MG tablet Take 1 tablet (1 mg total) by mouth daily. 30 tablet 1  . clonazePAM (KLONOPIN) 0.5 MG tablet Take 1 tablet (0.5 mg total) by mouth 2 (two) times daily. 60 tablet 1  . hydrochlorothiazide (HYDRODIURIL) 12.5 MG tablet Take 12.5 mg by mouth daily.  2  . lubiprostone (AMITIZA) 24 MCG capsule Take 1 capsule (24 mcg total) by mouth 2 (two) times daily with a meal. 180 capsule 3  .  Melatonin 5 MG TABS Take 1 tablet by mouth at bedtime as needed.    . meloxicam (MOBIC) 7.5 MG tablet TAKE 1 TABLET EVERY DAY 30 tablet 6  . phentermine (ADIPEX-P) 37.5 MG tablet Take 37.5 mg by mouth every morning.  2  . rosuvastatin (CRESTOR) 10 MG tablet TAKE 1 TABLET EVERY DAY 90 tablet 0  . ziprasidone (GEODON) 60 MG capsule Take 1 capsule (60 mg total) by mouth every evening. 30 capsule 1   No current facility-administered medications  for this visit.    Medical Decision Making:  Established Problem, Stable/Improving (1), Review and summation of old records (2) and Review or order medicine tests (1)  Treatment Plan Summary:Medication management   Discussed with patient about the medications and Will make changes to her medications as follows. Continue  Geodon 60 mg po qhs  Continue Klonopin 0.5  mg by mouth twice a day for her anxiety symptoms Medications refilled through [facility] in 2 months   More than 50% of the time spent in psychoeducation, counseling and coordination of care.    This note was generated in part or whole with voice recognition software. Voice regonition is usually quite accurate but there are transcription errors that can and very often do occur. I apologize for any typographical errors that were not detected and corrected.    Rainey Pines, MD  04/24/2016, 8:38 AM

## 2016-06-24 ENCOUNTER — Ambulatory Visit (INDEPENDENT_AMBULATORY_CARE_PROVIDER_SITE_OTHER): Payer: Medicare PPO | Admitting: Psychiatry

## 2016-06-24 ENCOUNTER — Telehealth: Payer: Self-pay

## 2016-06-24 ENCOUNTER — Encounter: Payer: Self-pay | Admitting: Psychiatry

## 2016-06-24 VITALS — BP 123/81 | HR 65 | Temp 97.8°F | Ht 64.0 in | Wt 165.8 lb

## 2016-06-24 DIAGNOSIS — F3160 Bipolar disorder, current episode mixed, unspecified: Secondary | ICD-10-CM | POA: Diagnosis not present

## 2016-06-24 DIAGNOSIS — F5101 Primary insomnia: Secondary | ICD-10-CM | POA: Diagnosis not present

## 2016-06-24 MED ORDER — ZIPRASIDONE HCL 60 MG PO CAPS: 60.0000 mg | ORAL_CAPSULE | Freq: Every evening | ORAL | 1 refills | Status: DC

## 2016-06-24 MED ORDER — CLONAZEPAM 0.5 MG PO TABS: 0.5000 mg | ORAL_TABLET | Freq: Every day | ORAL | 1 refills | Status: DC

## 2016-06-24 MED ORDER — BUSPIRONE HCL 10 MG PO TABS: 10.0000 mg | ORAL_TABLET | Freq: Every day | ORAL | 1 refills | Status: DC

## 2016-06-24 MED ORDER — TRAZODONE HCL 100 MG PO TABS: 100.0000 mg | ORAL_TABLET | Freq: Every day | ORAL | 1 refills | Status: DC

## 2016-06-24 NOTE — Progress Notes (Signed)
BH MD/PA/NP OP Progress Note  06/24/2016 9:15 AM Stacy Chambers  MRN:  [id number]  Subjective:    Patient is a 50 year old female with history of bipolar disorder who presented for the follow-up appointment. She reported that she is having confusion and memory problems. She reported that she has discussed that with her primary care physician who has advised the patient that she needs to discuss with me. Patient reported that she does not remember that how she has cut her own hairs. She reported that she only goes to the beauty salon to have the bangs adjusted. Patient reported that she is also not sleeping well at night. She unable to sleep until 12 midnight. She is supposed to be asleep  around 8 PM to wake up with her husband at 3 AM. She reported that her husband has to go to work at 5 AM. Patient reported that now she is not taking up in time to make lunch for him and he is not happy with her. She reported that she wants her medications adjusted. She currently denied having any perceptual disturbances. She has already stopped taking the Adipex as it was making her more agitated.  She remains pleasant and cooperative during the interview. We discussed about her medications in detail and she agreed with the plan to decrease the dose of Klonopin at this time. She also agreed to start the trazodone at this time.     Chief Complaint:  Chief Complaint    Follow-up; Medication Refill     Visit Diagnosis:     ICD-9-CM ICD-10-CM   1. Bipolar I disorder, most recent episode mixed ([place]) 296.60 F31.60   2. Primary insomnia 307.42 F51.01     Past Medical History:  Past Medical History:  Diagnosis Date  . Anxiety   . Bipolar disorder ([place])   . Breast mass 01/2015   left breast 11 oc  . Cancer ([place]) 1995   uterine  . Elevated liver function tests 11/26/2004   H/O NEGATIVE LIVER BIOPSY, workup negative  . Hyperlipidemia   . Osteoarthritis   . Ovarian cancer ([place]) 1995  . Restless legs  syndrome (RLS)   . RLS (restless legs syndrome)     Past Surgical History:  Procedure Laterality Date  . ABDOMINAL HYSTERECTOMY    . CESAREAN SECTION    . LIVER BIOPSY  2005   PerPt: Done to eval elevated LFTs and biopsy provided no definite dx/cause of elevated LFTs  . OOPHORECTOMY     Family History:  Family History  Problem Relation Age of Onset  . Adopted: Yes  . COPD Mother   . Depression Mother   . Colon cancer Mother     unsure of age  . Cancer Father     Lung CA, Skin Cancer--?type  . COPD Father   . Stroke Father   . Cancer Sister 33    cervical cancer  . COPD Brother   . Alcohol abuse Brother   . Stroke Brother   . COPD Maternal Aunt   . Depression Daughter   . Schizophrenia Son    Social History:  Social History   Social History  . Marital status: Married    Spouse name: N/A  . Number of children: N/A  . Years of education: N/A   Social History Main Topics  . Smoking status: Former Smoker    Types: Cigarettes    Quit date: 04/08/2009  . Smokeless tobacco: Never Used  . Alcohol use 1.8 -  4.2 oz/week    3 - 7 Shots of liquor per week     Comment: occasionally  . Drug use:     Types: Marijuana     Comment: last used last 02-17-16  . Sexual activity: Yes    Birth control/ protection: Surgical   Other Topics Concern  . None   Social History Narrative  . None   Additional History:   Lives with her husband.  Assessment:   Musculoskeletal: Strength & Muscle Tone: within normal limits Gait & Station: normal Patient leans: N/A  Psychiatric Specialty Exam: Depression         Associated symptoms include does not have insomnia.  Past medical history includes anxiety.   Drug Problem  Pertinent negatives include no hallucinations. Pertinent negatives include no vomiting.  Anxiety  Patient reports no insomnia or palpitations.    Medication Refill  Pertinent negatives include no chills, neck pain or vomiting.    Review of Systems   Constitutional: Negative for chills and malaise/fatigue.  HENT: Negative for nosebleeds and tinnitus.   Eyes: Negative for photophobia.  Respiratory: Negative for hemoptysis.   Cardiovascular: Negative for palpitations.  Gastrointestinal: Negative for vomiting.  Genitourinary: Negative for urgency.  Musculoskeletal: Positive for back pain and joint pain. Negative for neck pain.  Skin: Negative for itching.  Neurological: Negative for tremors.  Endo/Heme/Allergies: Negative for environmental allergies.  Psychiatric/Behavioral: Positive for depression. Negative for hallucinations. The patient does not have insomnia.     Blood pressure 123/81, pulse 65, temperature 97.8 F (36.6 C), temperature source Oral, height 5\' 4"  (1.626 m), weight 165 lb 12.8 oz (75.2 kg).Body mass index is 28.46 kg/m.  General Appearance: Neat  Eye Contact:  Fair  Speech:  Normal Rate  Volume:  Normal  Mood:  Anxious  Affect:  Congruent  Thought Process:  Goal Directed  Orientation:  Full (Time, Place, and Person)  Thought Content:  WDL  Suicidal Thoughts:  No  Homicidal Thoughts:  No  Memory:  Immediate;   Fair  Judgement:  Fair  Insight:  Fair  Psychomotor Activity:  Normal  Concentration:  Fair  Recall:  [employer] of Knowledge: Fair  Language: Fair  Akathisia:  No  Handed:  Right  AIMS (if indicated):  none  Assets:  Communication Skills Desire for Improvement Social Support  ADL's:  Intact  Cognition: WNL  Sleep:  6-7    Is the patient at risk to self?  No. Has the patient been a risk to self in the past 6 months?  No. Has the patient been a risk to self within the distant past?  No. Is the patient a risk to others?  No. Has the patient been a risk to others in the past 6 months?  No. Has the patient been a risk to others within the distant past?  No.  Current Medications: Current Outpatient Prescriptions  Medication Sig Dispense Refill  . alendronate (FOSAMAX) 70 MG tablet Take 1  tablet (70 mg total) by mouth once a week. Take with a full glass of water on an empty stomach. 12 tablet 3  . clonazePAM (KLONOPIN) 0.5 MG tablet Take 1 tablet (0.5 mg total) by mouth at bedtime. 30 tablet 1  . hydrochlorothiazide (HYDRODIURIL) 12.5 MG tablet Take 12.5 mg by mouth daily.  2  . lubiprostone (AMITIZA) 24 MCG capsule Take 1 capsule (24 mcg total) by mouth 2 (two) times daily with a meal. 180 capsule 3  . Melatonin 5 MG TABS  Take 1 tablet by mouth at bedtime as needed.    . meloxicam (MOBIC) 7.5 MG tablet TAKE 1 TABLET EVERY DAY 30 tablet 6  . rosuvastatin (CRESTOR) 10 MG tablet TAKE 1 TABLET EVERY DAY 90 tablet 0  . ziprasidone (GEODON) 60 MG capsule Take 1 capsule (60 mg total) by mouth every evening. 90 capsule 1  . busPIRone (BUSPAR) 10 MG tablet Take 1 tablet (10 mg total) by mouth daily after breakfast. 90 tablet 1  . traZODone (DESYREL) 100 MG tablet Take 1 tablet (100 mg total) by mouth at bedtime. 90 tablet 1   No current facility-administered medications for this visit.     Medical Decision Making:  Established Problem, Stable/Improving (1), Review and summation of old records (2) and Review or order medicine tests (1)  Treatment Plan Summary:Medication management   Discussed with patient about the medications and Will make changes to her medications as follows. Continue  Geodon 60 mg po qhs  Continue Klonopin 0.5  mg by mouth Daily at bedtime her anxiety symptoms Will add BuSpar 10 mg in the morning to help with her anxiety symptoms.  I will also add trazodone 100 mg by mouth daily at bedtime when necessary for insomnia  Medications refilled through [facility] in 1  months   More than 50% of the time spent in psychoeducation, counseling and coordination of care.    This note was generated in part or whole with voice recognition software. Voice regonition is usually quite accurate but there are transcription errors that can and very often do occur.  I apologize for any typographical errors that were not detected and corrected.    Rainey Pines, MD  06/24/2016, 9:15 AM

## 2016-06-24 NOTE — Telephone Encounter (Signed)
faxed and confirmed rx klonopin .5mg  id # [id number] order # [id number]

## 2016-07-17 ENCOUNTER — Ambulatory Visit (INDEPENDENT_AMBULATORY_CARE_PROVIDER_SITE_OTHER): Payer: Medicare PPO | Admitting: Psychiatry

## 2016-07-17 ENCOUNTER — Encounter: Payer: Self-pay | Admitting: Psychiatry

## 2016-07-17 ENCOUNTER — Other Ambulatory Visit: Payer: Self-pay | Admitting: Psychiatry

## 2016-07-17 VITALS — BP 100/67 | HR 72 | Temp 97.8°F | Ht 64.0 in | Wt 164.4 lb

## 2016-07-17 DIAGNOSIS — F3160 Bipolar disorder, current episode mixed, unspecified: Secondary | ICD-10-CM | POA: Diagnosis not present

## 2016-07-17 MED ORDER — CLONAZEPAM 0.5 MG PO TABS: 0.5000 mg | ORAL_TABLET | Freq: Every day | ORAL | 1 refills | Status: DC

## 2016-07-17 MED ORDER — ZIPRASIDONE HCL 20 MG PO CAPS: 20.0000 mg | ORAL_CAPSULE | Freq: Two times a day (BID) | ORAL | 1 refills | Status: DC

## 2016-07-17 NOTE — Progress Notes (Signed)
BH MD/PA/NP OP Progress Note  07/17/2016 9:16 AM Stacy Chambers  MRN:  [id number]  Subjective:    Patient is a 50 year old female with history of bipolar disorder who presented for the follow-up appointment. She reported that  she thinks that her medications are not working and her hair falling out. She reported that she was taking the weight loss medication phentermine and it caused hair loss. She has already stopped taking the phentermine about a month ago. She was focused on her hair loss and has started using the Roagine but it is not effective. We discussed about her medications. She reported that her mood is  agitated and she has been losing temper quickly. She wants her medications to be adjusted at this time.  Patient currently denied having any suicidal homicidal ideations or plans.   She has already stopped the trazodone and is taking melatonin to help her sleep at night.     Chief Complaint:  Chief Complaint    Follow-up; Medication Refill; Agitation; Other     Visit Diagnosis:     ICD-9-CM ICD-10-CM   1. Bipolar I disorder, most recent episode mixed ([place]) 296.60 F31.60     Past Medical History:  Past Medical History:  Diagnosis Date  . Anxiety   . Bipolar disorder ([place])   . Breast mass 01/2015   left breast 11 oc  . Cancer ([place]) 1995   uterine  . Elevated liver function tests 11/26/2004   H/O NEGATIVE LIVER BIOPSY, workup negative  . Hyperlipidemia   . Osteoarthritis   . Ovarian cancer ([place]) 1995  . Restless legs syndrome (RLS)   . RLS (restless legs syndrome)     Past Surgical History:  Procedure Laterality Date  . ABDOMINAL HYSTERECTOMY    . CESAREAN SECTION    . LIVER BIOPSY  2005   PerPt: Done to eval elevated LFTs and biopsy provided no definite dx/cause of elevated LFTs  . OOPHORECTOMY     Family History:  Family History  Problem Relation Age of Onset  . Adopted: Yes  . COPD Mother   . Depression Mother   . Colon cancer Mother     unsure of age   . Cancer Father     Lung CA, Skin Cancer--?type  . COPD Father   . Stroke Father   . Cancer Sister 52    cervical cancer  . COPD Brother   . Alcohol abuse Brother   . Stroke Brother   . COPD Maternal Aunt   . Depression Daughter   . Schizophrenia Son    Social History:  Social History   Social History  . Marital status: Married    Spouse name: N/A  . Number of children: N/A  . Years of education: N/A   Social History Main Topics  . Smoking status: Former Smoker    Types: Cigarettes    Quit date: 04/08/2009  . Smokeless tobacco: Never Used  . Alcohol use 1.8 - 4.2 oz/week    3 - 7 Shots of liquor per week     Comment: occasionally  . Drug use:     Types: Marijuana     Comment: last used last 02-17-16  . Sexual activity: Yes    Birth control/ protection: Surgical   Other Topics Concern  . None   Social History Narrative  . None   Additional History:   Lives with her husband.  Assessment:   Musculoskeletal: Strength & Muscle Tone: within normal limits Gait & Station:  normal Patient leans: N/A  Psychiatric Specialty Exam: Medication Refill  Pertinent negatives include no chills, neck pain or vomiting.  Depression         Associated symptoms include does not have insomnia.  Past medical history includes anxiety.   Drug Problem  Pertinent negatives include no hallucinations. Pertinent negatives include no vomiting.  Anxiety  Patient reports no insomnia or palpitations.      Review of Systems  Constitutional: Negative for chills and malaise/fatigue.  HENT: Negative for nosebleeds and tinnitus.   Eyes: Negative for photophobia.  Respiratory: Negative for hemoptysis.   Cardiovascular: Negative for palpitations.  Gastrointestinal: Negative for vomiting.  Genitourinary: Negative for urgency.  Musculoskeletal: Positive for back pain and joint pain. Negative for neck pain.  Skin: Negative for itching.  Neurological: Negative for tremors.   Endo/Heme/Allergies: Negative for environmental allergies.  Psychiatric/Behavioral: Positive for depression. Negative for hallucinations. The patient does not have insomnia.     Blood pressure 100/67, pulse 72, temperature 97.8 F (36.6 C), temperature source Oral, height 5\' 4"  (1.626 m), weight 164 lb 6.4 oz (74.6 kg).Body mass index is 28.22 kg/m.  General Appearance: Neat  Eye Contact:  Fair  Speech:  Normal Rate  Volume:  Normal  Mood:  Anxious  Affect:  Congruent  Thought Process:  Goal Directed  Orientation:  Full (Time, Place, and Person)  Thought Content:  WDL  Suicidal Thoughts:  No  Homicidal Thoughts:  No  Memory:  Immediate;   Fair  Judgement:  Fair  Insight:  Fair  Psychomotor Activity:  Normal  Concentration:  Fair  Recall:  [employer] of Knowledge: Fair  Language: Fair  Akathisia:  No  Handed:  Right  AIMS (if indicated):  none  Assets:  Communication Skills Desire for Improvement Social Support  ADL's:  Intact  Cognition: WNL  Sleep:  6-7    Is the patient at risk to self?  No. Has the patient been a risk to self in the past 6 months?  No. Has the patient been a risk to self within the distant past?  No. Is the patient a risk to others?  No. Has the patient been a risk to others in the past 6 months?  No. Has the patient been a risk to others within the distant past?  No.  Current Medications: Current Outpatient Prescriptions  Medication Sig Dispense Refill  . alendronate (FOSAMAX) 70 MG tablet Take 1 tablet (70 mg total) by mouth once a week. Take with a full glass of water on an empty stomach. 12 tablet 3  . busPIRone (BUSPAR) 10 MG tablet Take 1 tablet (10 mg total) by mouth daily after breakfast. 90 tablet 1  . clonazePAM (KLONOPIN) 0.5 MG tablet Take 1 tablet (0.5 mg total) by mouth at bedtime. 30 tablet 1  . hydrochlorothiazide (HYDRODIURIL) 12.5 MG tablet Take 12.5 mg by mouth daily.  2  . lubiprostone (AMITIZA) 24 MCG capsule Take 1 capsule  (24 mcg total) by mouth 2 (two) times daily with a meal. 180 capsule 3  . Melatonin 5 MG TABS Take 1 tablet by mouth at bedtime as needed.    . meloxicam (MOBIC) 7.5 MG tablet TAKE 1 TABLET EVERY DAY 30 tablet 6  . rosuvastatin (CRESTOR) 10 MG tablet TAKE 1 TABLET EVERY DAY 90 tablet 0  . ziprasidone (GEODON) 60 MG capsule Take 1 capsule (60 mg total) by mouth every evening. 90 capsule 1  . ziprasidone (GEODON) 20 MG capsule Take 1  capsule (20 mg total) by mouth 2 (two) times daily with a meal. 60 capsule 1   No current facility-administered medications for this visit.     Medical Decision Making:  Established Problem, Stable/Improving (1), Review and summation of old records (2) and Review or order medicine tests (1)  Treatment Plan Summary:Medication management   Discussed with patient about the medications and Will make changes to her medications as follows. Continue  Geodon 60 mg po qhs  I will add Geodon 20 mg by mouth twice a day when necessary for her more symptoms and she agreed with the plan. He has supply of other medications. Klonopin  filled at this time Continue Klonopin 0.5  mg by mouth Daily at bedtime her anxiety symptoms Will add BuSpar 10 mg in the morning to help with her anxiety symptoms.  DC trazodone Advised to start taking biotin and vitamin D on a regular basis  Medications refilled through [facility] Follow-up in 1  months   More than 50% of the time spent in psychoeducation, counseling and coordination of care.    This note was generated in part or whole with voice recognition software. Voice regonition is usually quite accurate but there are transcription errors that can and very often do occur. I apologize for any typographical errors that were not detected and corrected.    Rainey Pines, MD  07/17/2016, 9:16 AM

## 2016-07-23 ENCOUNTER — Ambulatory Visit: Payer: Medicare PPO | Admitting: Psychiatry

## 2016-08-13 ENCOUNTER — Ambulatory Visit: Payer: Medicare PPO | Admitting: Psychiatry

## 2016-08-19 ENCOUNTER — Telehealth: Payer: Self-pay

## 2016-08-19 ENCOUNTER — Encounter: Payer: Self-pay | Admitting: Psychiatry

## 2016-08-19 ENCOUNTER — Ambulatory Visit (INDEPENDENT_AMBULATORY_CARE_PROVIDER_SITE_OTHER): Payer: Medicare PPO | Admitting: Psychiatry

## 2016-08-19 VITALS — BP 112/75 | HR 66 | Temp 97.7°F | Wt 172.8 lb

## 2016-08-19 DIAGNOSIS — F5101 Primary insomnia: Secondary | ICD-10-CM | POA: Diagnosis not present

## 2016-08-19 DIAGNOSIS — F3160 Bipolar disorder, current episode mixed, unspecified: Secondary | ICD-10-CM | POA: Diagnosis not present

## 2016-08-19 MED ORDER — CLONAZEPAM 0.5 MG PO TABS: 0.5000 mg | ORAL_TABLET | Freq: Every day | ORAL | 1 refills | Status: DC

## 2016-08-19 MED ORDER — BUSPIRONE HCL 10 MG PO TABS: 10.0000 mg | ORAL_TABLET | Freq: Two times a day (BID) | ORAL | 1 refills | Status: DC

## 2016-08-19 MED ORDER — ZIPRASIDONE HCL 60 MG PO CAPS: 60.0000 mg | ORAL_CAPSULE | Freq: Every evening | ORAL | 1 refills | Status: DC

## 2016-08-19 MED ORDER — ZIPRASIDONE HCL 20 MG PO CAPS: 20.0000 mg | ORAL_CAPSULE | Freq: Two times a day (BID) | ORAL | 1 refills | Status: DC

## 2016-08-19 NOTE — Telephone Encounter (Signed)
faxed and confirmed rx for klonopin 0,5mg  id # [id number] order # [id number].

## 2016-08-19 NOTE — Progress Notes (Signed)
[facility] MD/PA/NP OP Progress Note  08/19/2016 10:01 AM Stacy Chambers  MRN:  [id number]  Subjective:    Patient is a 50 year old female with history of bipolar disorder who presented for the follow-up appointment. She reported that she has started improving on her medications. She reported that she is doing well and her husband and daughter thinks that the current combination of medications helping her. She is not having mood symptoms and her anxiety is getting better. She has been taking the medications as prescribed. She is also taking biotin to help with her hair loss. She reported that she has gained some weight as she is not doing exercise due to problems in her feet. She is planning to had the surgery done when the weather will change. She appeared happy during the interview. She denied having any suicidal ideations or plans. We discussed about the medications and she is receptive to taking them as prescribed.   Patient currently denied having any suicidal homicidal ideations or plans.   She is taking melatonin to help her sleep at night.     Chief Complaint:  Chief Complaint    Follow-up; Medication Refill     Visit Diagnosis:   No diagnosis found.  Past Medical History:  Past Medical History:  Diagnosis Date  . Anxiety   . Bipolar disorder ([place])   . Breast mass 01/2015   left breast 11 oc  . Cancer ([place]) 1995   uterine  . Elevated liver function tests 11/26/2004   H/O NEGATIVE LIVER BIOPSY, workup negative  . Hyperlipidemia   . Osteoarthritis   . Ovarian cancer ([place]) 1995  . Restless legs syndrome (RLS)   . RLS (restless legs syndrome)     Past Surgical History:  Procedure Laterality Date  . ABDOMINAL HYSTERECTOMY    . CESAREAN SECTION    . LIVER BIOPSY  2005   PerPt: Done to eval elevated LFTs and biopsy provided no definite dx/cause of elevated LFTs  . OOPHORECTOMY     Family History:  Family History  Problem Relation Age of Onset  . Adopted: Yes  . COPD  Mother   . Depression Mother   . Colon cancer Mother     unsure of age  . Cancer Father     Lung CA, Skin Cancer--?type  . COPD Father   . Stroke Father   . Cancer Sister 40    cervical cancer  . COPD Brother   . Alcohol abuse Brother   . Stroke Brother   . COPD Maternal Aunt   . Depression Daughter   . Schizophrenia Son    Social History:  Social History   Social History  . Marital status: Married    Spouse name: N/A  . Number of children: N/A  . Years of education: N/A   Social History Main Topics  . Smoking status: Former Smoker    Types: Cigarettes    Quit date: 04/08/2009  . Smokeless tobacco: Never Used  . Alcohol use 1.8 - 4.2 oz/week    3 - 7 Shots of liquor per week     Comment: occasionally  . Drug use:     Types: Marijuana     Comment: last used last 02-17-16  . Sexual activity: Yes    Birth control/ protection: Surgical   Other Topics Concern  . None   Social History Narrative  . None   Additional History:   Lives with her husband.  Assessment:   Musculoskeletal: Strength & Muscle  Tone: within normal limits Gait & Station: normal Patient leans: N/A  Psychiatric Specialty Exam: Medication Refill  Pertinent negatives include no chills, neck pain or vomiting.  Depression         Associated symptoms include does not have insomnia.  Past medical history includes anxiety.   Drug Problem  Pertinent negatives include no hallucinations. Pertinent negatives include no vomiting.  Anxiety  Patient reports no insomnia or palpitations.      Review of Systems  Constitutional: Negative for chills and malaise/fatigue.  HENT: Negative for nosebleeds and tinnitus.   Eyes: Negative for photophobia.  Respiratory: Negative for hemoptysis.   Cardiovascular: Negative for palpitations.  Gastrointestinal: Negative for vomiting.  Genitourinary: Negative for urgency.  Musculoskeletal: Positive for back pain and joint pain. Negative for neck pain.  Skin:  Negative for itching.  Neurological: Negative for tremors.  Endo/Heme/Allergies: Negative for environmental allergies.  Psychiatric/Behavioral: Positive for depression. Negative for hallucinations. The patient does not have insomnia.     Blood pressure 112/75, pulse 66, temperature 97.7 F (36.5 C), temperature source Oral, weight 172 lb 12.8 oz (78.4 kg).Body mass index is 29.66 kg/m.  General Appearance: Neat  Eye Contact:  Fair  Speech:  Normal Rate  Volume:  Normal  Mood:  Anxious  Affect:  Congruent  Thought Process:  Goal Directed  Orientation:  Full (Time, Place, and Person)  Thought Content:  WDL  Suicidal Thoughts:  No  Homicidal Thoughts:  No  Memory:  Immediate;   Fair  Judgement:  Fair  Insight:  Fair  Psychomotor Activity:  Normal  Concentration:  Fair  Recall:  [employer] of Knowledge: Fair  Language: Fair  Akathisia:  No  Handed:  Right  AIMS (if indicated):  none  Assets:  Communication Skills Desire for Improvement Social Support  ADL's:  Intact  Cognition: WNL  Sleep:  6-7    Is the patient at risk to self?  No. Has the patient been a risk to self in the past 6 months?  No. Has the patient been a risk to self within the distant past?  No. Is the patient a risk to others?  No. Has the patient been a risk to others in the past 6 months?  No. Has the patient been a risk to others within the distant past?  No.  Current Medications: Current Outpatient Prescriptions  Medication Sig Dispense Refill  . alendronate (FOSAMAX) 70 MG tablet Take 1 tablet (70 mg total) by mouth once a week. Take with a full glass of water on an empty stomach. 12 tablet 3  . busPIRone (BUSPAR) 10 MG tablet Take 1 tablet (10 mg total) by mouth daily after breakfast. 90 tablet 1  . clonazePAM (KLONOPIN) 0.5 MG tablet Take 1 tablet (0.5 mg total) by mouth at bedtime. 30 tablet 1  . hydrochlorothiazide (HYDRODIURIL) 12.5 MG tablet Take 12.5 mg by mouth daily.  2  . lubiprostone  (AMITIZA) 24 MCG capsule Take 1 capsule (24 mcg total) by mouth 2 (two) times daily with a meal. 180 capsule 3  . Melatonin 5 MG TABS Take 1 tablet by mouth at bedtime as needed.    . meloxicam (MOBIC) 7.5 MG tablet TAKE 1 TABLET EVERY DAY 30 tablet 6  . rosuvastatin (CRESTOR) 10 MG tablet TAKE 1 TABLET EVERY DAY 90 tablet 0  . ziprasidone (GEODON) 20 MG capsule Take 1 capsule (20 mg total) by mouth 2 (two) times daily with a meal. 60 capsule 1  .  ziprasidone (GEODON) 60 MG capsule Take 1 capsule (60 mg total) by mouth every evening. 90 capsule 1   No current facility-administered medications for this visit.     Medical Decision Making:  Established Problem, Stable/Improving (1), Review and summation of old records (2) and Review or order medicine tests (1)  Treatment Plan Summary:Medication management   Discussed with patient about the medications and Will make changes to her medications as follows. Continue  Geodon 60 mg po qhs  Continue  Geodon 20 mg by mouth twice a day  Continue Klonopin 0.5  mg by mouth daily at bedtime her anxiety symptoms BuSpar 10 mg BID to help with her anxiety symptoms.  Advised to start taking biotin and vitamin D on a regular basis  Medications refilled through [facility] in 3  months   Advised patient that I will be leaving this office in the end of November and she demonstrated understanding.  More than 50% of the time spent in psychoeducation, counseling and coordination of care.    This note was generated in part or whole with voice recognition software. Voice regonition is usually quite accurate but there are transcription errors that can and very often do occur. I apologize for any typographical errors that were not detected and corrected.    Rainey Pines, MD  08/19/2016, 10:01 AM

## 2016-09-02 ENCOUNTER — Other Ambulatory Visit: Payer: Self-pay | Admitting: Podiatry

## 2016-09-02 DIAGNOSIS — M79605 Pain in left leg: Secondary | ICD-10-CM

## 2016-09-02 DIAGNOSIS — M7989 Other specified soft tissue disorders: Principal | ICD-10-CM

## 2016-09-02 DIAGNOSIS — M79675 Pain in left toe(s): Secondary | ICD-10-CM

## 2016-09-03 ENCOUNTER — Other Ambulatory Visit: Payer: Self-pay | Admitting: Podiatry

## 2016-09-03 DIAGNOSIS — G5762 Lesion of plantar nerve, left lower limb: Secondary | ICD-10-CM

## 2016-09-03 DIAGNOSIS — M79605 Pain in left leg: Secondary | ICD-10-CM

## 2016-09-03 DIAGNOSIS — M7989 Other specified soft tissue disorders: Principal | ICD-10-CM

## 2016-09-12 ENCOUNTER — Ambulatory Visit
Admission: RE | Admit: 2016-09-12 | Discharge: 2016-09-12 | Disposition: A | Discharge status: Home / Self Care | Payer: Medicare PPO | Source: Ambulatory Visit | Attending: Podiatry | Admitting: Podiatry

## 2016-09-12 DIAGNOSIS — G5762 Lesion of plantar nerve, left lower limb: Secondary | ICD-10-CM | POA: Insufficient documentation

## 2016-09-12 DIAGNOSIS — M79605 Pain in left leg: Secondary | ICD-10-CM | POA: Insufficient documentation

## 2016-09-12 DIAGNOSIS — M7989 Other specified soft tissue disorders: Secondary | ICD-10-CM | POA: Diagnosis present

## 2016-10-01 ENCOUNTER — Encounter: Payer: Self-pay | Admitting: *Deleted

## 2016-10-02 NOTE — Discharge Instructions (Signed)
[facility] °[facility] ° °POST OPERATIVE INSTRUCTIONS FOR DR. TROXLER AND DR. FOWLER °[facility] ° ° °1. Take your medication as prescribed.  Pain medication should be taken only as needed. ° °2. Keep the dressing clean, dry and intact. ° °3. Keep your foot elevated above the heart level for the first 48 hours. ° °4. Walking to the bathroom and brief periods of walking are acceptable, unless we have instructed you to be non-weight bearing. ° °5. Always wear your post-op shoe when walking.  Always use your crutches if you are to be non-weight bearing. ° °6. Do not take a shower. Baths are permissible as long as the foot is kept out of the water.  ° °7. Every hour you are awake:  °- Bend your knee 15 times. °- Flex foot 15 times °- Massage calf 15 times ° °8. Call [facility] ([phone]) if any of the following problems occur: °- You develop a temperature or fever. °- The bandage becomes saturated with blood. °- Medication does not stop your pain. °- Injury of the foot occurs. °- Any symptoms of infection including redness, odor, or red streaks running from wound. ° ° °General Anesthesia, Adult, Care After °These instructions provide you with information about caring for yourself after your procedure. Your health care provider may also give you more specific instructions. Your treatment has been planned according to current medical practices, but problems sometimes occur. Call your health care provider if you have any problems or questions after your procedure. °What can I expect after the procedure? °After the procedure, it is common to have: °· Vomiting. °· A sore throat. °· Mental slowness. °It is common to feel: °· Nauseous. °· Cold or shivery. °· Sleepy. °· Tired. °· Sore or achy, even in parts of your body where you did not have surgery. °Follow these instructions at home: °For at least 24 hours after the procedure: °· Do not: °¨ Participate in  activities where you could fall or become injured. °¨ Drive. °¨ Use heavy machinery. °¨ Drink alcohol. °¨ Take sleeping pills or medicines that cause drowsiness. °¨ Make important decisions or sign legal documents. °¨ Take care of children on your own. °· Rest. °Eating and drinking °· If you vomit, drink water, juice, or soup when you can drink without vomiting. °· Drink enough fluid to keep your urine clear or pale yellow. °· Make sure you have little or no nausea before eating solid foods. °· Follow the diet recommended by your health care provider. °General instructions °· Have a responsible adult stay with you until you are awake and alert. °· Return to your normal activities as told by your health care provider. Ask your health care provider what activities are safe for you. °· Take over-the-counter and prescription medicines only as told by your health care provider. °· If you smoke, do not smoke without supervision. °· Keep all follow-up visits as told by your health care provider. This is important. °Contact a health care provider if: °· You continue to have nausea or vomiting at home, and medicines are not helpful. °· You cannot drink fluids or start eating again. °· You cannot urinate after 8-12 hours. °· You develop a skin rash. °· You have fever. °· You have increasing redness at the site of your procedure. °Get help right away if: °· You have difficulty breathing. °· You have chest pain. °· You have unexpected bleeding. °· You feel that you are having   a life-threatening or urgent problem. °This information is not intended to replace advice given to you by your health care provider. Make sure you discuss any questions you have with your health care provider. °Document Released: 01/18/2001 Document Revised: 03/16/2016 Document Reviewed: 09/26/2015 °Elsevier Interactive Patient Education © 2017 [employer] ° °

## 2016-10-07 ENCOUNTER — Encounter
Admission: RE | Disposition: A | Discharge status: Home / Self Care | Payer: Self-pay | Source: Ambulatory Visit | Attending: Podiatry

## 2016-10-07 ENCOUNTER — Ambulatory Visit
Admission: RE | Admit: 2016-10-07 | Discharge: 2016-10-07 | Disposition: A | Discharge status: Home / Self Care | Payer: Medicare PPO | Source: Ambulatory Visit | Attending: Podiatry | Admitting: Podiatry

## 2016-10-07 ENCOUNTER — Ambulatory Visit: Payer: Medicare PPO | Admitting: Anesthesiology

## 2016-10-07 DIAGNOSIS — G2581 Restless legs syndrome: Secondary | ICD-10-CM | POA: Diagnosis not present

## 2016-10-07 DIAGNOSIS — Z87891 Personal history of nicotine dependence: Secondary | ICD-10-CM | POA: Diagnosis not present

## 2016-10-07 DIAGNOSIS — J449 Chronic obstructive pulmonary disease, unspecified: Secondary | ICD-10-CM | POA: Insufficient documentation

## 2016-10-07 DIAGNOSIS — G5760 Lesion of plantar nerve, unspecified lower limb: Secondary | ICD-10-CM | POA: Diagnosis present

## 2016-10-07 DIAGNOSIS — F319 Bipolar disorder, unspecified: Secondary | ICD-10-CM | POA: Insufficient documentation

## 2016-10-07 DIAGNOSIS — R7989 Other specified abnormal findings of blood chemistry: Secondary | ICD-10-CM | POA: Insufficient documentation

## 2016-10-07 DIAGNOSIS — F419 Anxiety disorder, unspecified: Secondary | ICD-10-CM | POA: Insufficient documentation

## 2016-10-07 DIAGNOSIS — G5762 Lesion of plantar nerve, left lower limb: Secondary | ICD-10-CM | POA: Insufficient documentation

## 2016-10-07 HISTORY — PX: EXCISION MORTON'S NEUROMA: SHX5013

## 2016-10-07 HISTORY — DX: Low back pain, unspecified: M54.50

## 2016-10-07 HISTORY — DX: Low back pain: M54.5

## 2016-10-07 HISTORY — DX: Presence of dental prosthetic device (complete) (partial): Z97.2

## 2016-10-07 SURGERY — EXCISION, MORTON'S NEUROMA
Anesthesia: Monitor Anesthesia Care | Site: Foot | Laterality: Left | Wound class: Clean

## 2016-10-07 MED ORDER — ACETAMINOPHEN 160 MG/5ML PO SOLN: 325.0000 mg | ORAL | Status: DC | PRN

## 2016-10-07 MED ORDER — DEXTROSE 5 % IV SOLN: INTRAVENOUS | Status: DC | PRN

## 2016-10-07 MED ORDER — OXYCODONE HCL 5 MG/5ML PO SOLN: 5.0000 mg | Freq: Once | ORAL | Status: DC | PRN

## 2016-10-07 MED ORDER — HYDROMORPHONE HCL 1 MG/ML IJ SOLN: 0.2500 mg | INTRAMUSCULAR | Status: DC | PRN

## 2016-10-07 MED ORDER — OXYCODONE HCL 5 MG PO TABS: 5.0000 mg | ORAL_TABLET | Freq: Once | ORAL | Status: DC | PRN

## 2016-10-07 MED ORDER — LIDOCAINE-EPINEPHRINE (PF) 1 %-1:200000 IJ SOLN
INTRAMUSCULAR | Status: DC | PRN
  Administered 2016-10-07: 6 mL

## 2016-10-07 MED ORDER — FENTANYL CITRATE (PF) 100 MCG/2ML IJ SOLN
INTRAMUSCULAR | Status: DC | PRN
Start: 2016-10-07 — End: 2016-10-07
  Administered 2016-10-07: 100 ug via INTRAVENOUS

## 2016-10-07 MED ORDER — LACTATED RINGERS IV SOLN
INTRAVENOUS | Status: DC
  Administered 2016-10-07: 15:00:00 via INTRAVENOUS

## 2016-10-07 MED ORDER — ONDANSETRON HCL 4 MG PO TABS: 4.0000 mg | ORAL_TABLET | Freq: Four times a day (QID) | ORAL | Status: DC | PRN

## 2016-10-07 MED ORDER — BUPIVACAINE HCL (PF) 0.25 % IJ SOLN
INTRAMUSCULAR | Status: DC | PRN
  Administered 2016-10-07: 10 mL

## 2016-10-07 MED ORDER — MIDAZOLAM HCL 2 MG/2ML IJ SOLN
INTRAMUSCULAR | Status: DC | PRN
  Administered 2016-10-07: 2 mg via INTRAVENOUS

## 2016-10-07 MED ORDER — ONDANSETRON HCL 4 MG/2ML IJ SOLN: 4.0000 mg | Freq: Four times a day (QID) | INTRAMUSCULAR | Status: DC | PRN

## 2016-10-07 MED ORDER — CEFAZOLIN SODIUM-DEXTROSE 2-4 GM/100ML-% IV SOLN: 2.0000 g | Freq: Once | INTRAVENOUS | Status: DC

## 2016-10-07 MED ORDER — ONDANSETRON HCL 4 MG/2ML IJ SOLN: 4.0000 mg | Freq: Once | INTRAMUSCULAR | Status: DC | PRN

## 2016-10-07 MED ORDER — LIDOCAINE HCL 1 % IJ SOLN
INTRAMUSCULAR | Status: DC | PRN
  Administered 2016-10-07: 3 mL

## 2016-10-07 MED ORDER — LIDOCAINE HCL (CARDIAC) 20 MG/ML IV SOLN
INTRAVENOUS | Status: DC | PRN
  Administered 2016-10-07: 40 mg via INTRAVENOUS

## 2016-10-07 MED ORDER — OXYCODONE-ACETAMINOPHEN 5-325 MG PO TABS: 1.0000 | ORAL_TABLET | ORAL | 0 refills | Status: DC | PRN

## 2016-10-07 MED ORDER — CEFAZOLIN SODIUM-DEXTROSE 2-3 GM-% IV SOLR
INTRAVENOUS | Status: DC | PRN
  Administered 2016-10-07: 2 g via INTRAVENOUS

## 2016-10-07 MED ORDER — ACETAMINOPHEN 325 MG PO TABS
325.0000 mg | ORAL_TABLET | ORAL | Status: DC | PRN
  Administered 2016-10-07: 650 mg via ORAL

## 2016-10-07 MED ORDER — OXYCODONE-ACETAMINOPHEN 5-325 MG PO TABS: 1.0000 | ORAL_TABLET | ORAL | Status: DC | PRN

## 2016-10-07 MED ORDER — PROPOFOL 500 MG/50ML IV EMUL
INTRAVENOUS | Status: DC | PRN
  Administered 2016-10-07: 75 ug/kg/min via INTRAVENOUS

## 2016-10-07 SURGICAL SUPPLY — 29 items
BANDAGE ELASTIC 4 VELCRO NS (GAUZE/BANDAGES/DRESSINGS) ×2 IMPLANT
BLADE SURG 15 STRL LF DISP TIS (BLADE) ×1 IMPLANT
BLADE SURG 15 STRL SS (BLADE) ×1
BNDG ESMARK 4X12 TAN STRL LF (GAUZE/BANDAGES/DRESSINGS) ×2 IMPLANT
BNDG GAUZE 4.5X4.1 6PLY STRL (MISCELLANEOUS) ×2 IMPLANT
BNDG STRETCH 4X75 STRL LF (GAUZE/BANDAGES/DRESSINGS) ×2 IMPLANT
CANISTER SUCT 1200ML W/VALVE (MISCELLANEOUS) ×2 IMPLANT
COVER LIGHT HANDLE UNIVERSAL (MISCELLANEOUS) ×4 IMPLANT
CUFF TOURN SGL QUICK 18 (TOURNIQUET CUFF) ×2 IMPLANT
DURAPREP 26ML APPLICATOR (WOUND CARE) ×2 IMPLANT
ELECT REM PT RETURN 9FT ADLT (ELECTROSURGICAL) ×2
ELECTRODE REM PT RTRN 9FT ADLT (ELECTROSURGICAL) ×1 IMPLANT
GAUZE PETRO XEROFOAM 1X8 (MISCELLANEOUS) ×2 IMPLANT
GAUZE SPONGE 4X4 12PLY STRL (GAUZE/BANDAGES/DRESSINGS) ×2 IMPLANT
GLOVE BIO SURGEON STRL SZ7.5 (GLOVE) ×2 IMPLANT
GLOVE INDICATOR 8.0 STRL GRN (GLOVE) ×2 IMPLANT
GOWN STRL REUS W/ TWL LRG LVL3 (GOWN DISPOSABLE) ×2 IMPLANT
GOWN STRL REUS W/TWL LRG LVL3 (GOWN DISPOSABLE) ×2
NEEDLE HYPO 25GX1X1/2 BEV (NEEDLE) ×4 IMPLANT
NS IRRIG 500ML POUR BTL (IV SOLUTION) ×2 IMPLANT
PACK EXTREMITY ARMC (MISCELLANEOUS) ×2 IMPLANT
STOCKINETTE STRL 6IN 960660 (GAUZE/BANDAGES/DRESSINGS) ×2 IMPLANT
STRAP BODY AND KNEE 60X3 (MISCELLANEOUS) ×2 IMPLANT
STRIP CLOSURE SKIN 1/4X4 (GAUZE/BANDAGES/DRESSINGS) ×2 IMPLANT
SUT MNCRL+ 5-0 UNDYED PC-3 (SUTURE) ×1 IMPLANT
SUT MONOCRYL 5-0 (SUTURE) ×1
SUT VIC AB 4-0 FS2 27 (SUTURE) ×2 IMPLANT
SWABSTK COMLB BENZOIN TINCTURE (MISCELLANEOUS) ×2 IMPLANT
SYRINGE 10CC LL (SYRINGE) ×2 IMPLANT

## 2016-10-07 NOTE — Op Note (Signed)
Operative note   Surgeon:Rozalia Dino Lawyer: None    Preop diagnosis: Left foot third webspace Morton's neuroma    Postop diagnosis: Same    Procedure: Excision Morton's neuroma left third webspace    EBL: Minimal    Anesthesia:local and IV sedation    Hemostasis: Epinephrine 1-200,000 infiltrated along the incision site    Specimen: Morton's neuroma left third webspace    Complications: None    Operative indications:Stacy Chambers is an 50 y.o. that presents today for surgical intervention.  The risks/benefits/alternatives/complications have been discussed and consent has been given.    Procedure:  Patient was brought into the OR and placed on the operating table in thesupine position. After anesthesia was obtained theleft lower extremity was prepped and draped in usual sterile fashion.  Attention was directed to the dorsal left third webspace where a dorsal webspace splitting incision was performed. Blunt dissection carried down to the deep TIO which was transected. At this time there was noted to be a bulbous lesion with fat surrounding area. The 2 distal arms to the third and fourth toes were noted and transected. The lesion was intractable back into the deep intermetatarsal space. This was transected. This was allowed to retract proximal. The neuroma specimen was removed from the surgical field in toto. The wound was flushed with copious amounts or irrigation. Layered closure was performed with a 4-0 Vicryl for the deeper layers and a 5-0 Monocryl undyed for skin. A well compressive sterile bulky dressing was applied to the left foot. 0.25% Marcaine was then infiltrated around the incision site.    Patient tolerated the procedure and anesthesia well.  Was transported from the OR to the PACU with all vital signs stable and vascular status intact. To be discharged per routine protocol.  Will follow up in approximately 1 week in the outpatient clinic.

## 2016-10-07 NOTE — H&P (Signed)
  HISTORY AND PHYSICAL INTERVAL NOTE:  10/07/2016  3:01 PM  Stacy Chambers  has presented today for surgery, with the diagnosis of [phone] mortons neuroma.  The various methods of treatment have been discussed with the patient.  No guarantees were given.  After consideration of risks, benefits and other options for treatment, the patient has consented to surgery.  I have reviewed the patients' chart and labs.    Patient Vitals for the past 24 hrs:  BP Temp Temp src Pulse Resp SpO2 Height Weight  10/07/16 1316 111/81 97.5 F (36.4 C) Tympanic 89 18 99 % 5\' 4"  (1.626 m) 79.8 kg (176 lb)    A history and physical examination was performed in my office.  The patient was reexamined.  There have been no changes to this history and physical examination.  Stacy Chambers A

## 2016-10-07 NOTE — Anesthesia Procedure Notes (Signed)
Procedure Name: MAC Performed by: Tirth Cothron Pre-anesthesia Checklist: Patient identified, Emergency Drugs available, Suction available, Timeout performed and Patient being monitored Patient Re-evaluated:Patient Re-evaluated prior to inductionOxygen Delivery Method: Simple face mask Placement Confirmation: positive ETCO2       

## 2016-10-07 NOTE — Anesthesia Postprocedure Evaluation (Signed)
Anesthesia Post Note  Patient: Stacy Chambers  Procedure(s) Performed: Procedure(s) (LRB): EXCISION MORTON'S NEUROMA (Left)  Patient location during evaluation: PACU Anesthesia Type: MAC Level of consciousness: awake and alert Pain management: pain level controlled Vital Signs Assessment: post-procedure vital signs reviewed and stable Respiratory status: spontaneous breathing, nonlabored ventilation, respiratory function stable and patient connected to nasal cannula oxygen Cardiovascular status: stable and blood pressure returned to baseline Anesthetic complications: no    Trecia Rogers

## 2016-10-07 NOTE — Anesthesia Preprocedure Evaluation (Addendum)
Anesthesia Evaluation  Patient identified by MRN, date of birth, ID band Patient awake    Reviewed: Allergy & Precautions, H&P , NPO status , Patient's Chart, lab work & pertinent test results, reviewed documented beta blocker date and time   Airway Mallampati: II  TM Distance: >3 FB Neck ROM: full    Dental no notable dental hx. (+) Edentulous Upper, Edentulous Lower   Pulmonary COPD, former smoker,    Pulmonary exam normal breath sounds clear to auscultation       Cardiovascular Exercise Tolerance: Good negative cardio ROS Normal cardiovascular exam Rhythm:regular Rate:Normal     Neuro/Psych PSYCHIATRIC DISORDERS Anxiety, hx bipolar disorder, sleep aide dependence Affective psychosis Hx restless leg syndrome    GI/Hepatic negative GI ROS, Hx of elevated LFTs with negative biopsy and workup.   Endo/Other  negative endocrine ROS  Renal/GU negative Renal ROS  negative genitourinary   Musculoskeletal   Abdominal   Peds  Hematology negative hematology ROS (+)   Anesthesia Other Findings   Reproductive/Obstetrics negative OB ROS                            Anesthesia Physical Anesthesia Plan  ASA: II  Anesthesia Plan: MAC   Post-op Pain Management:    Induction:   Airway Management Planned:   Additional Equipment:   Intra-op Plan:   Post-operative Plan:   Informed Consent: I have reviewed the patients History and Physical, chart, labs and discussed the procedure including the risks, benefits and alternatives for the proposed anesthesia with the patient or authorized representative who has indicated his/her understanding and acceptance.   Dental Advisory Given  Plan Discussed with: CRNA and Anesthesiologist  Anesthesia Plan Comments:         Anesthesia Quick Evaluation

## 2016-10-07 NOTE — Transfer of Care (Signed)
Immediate Anesthesia Transfer of Care Note  Patient: Stacy Chambers  Procedure(s) Performed: Procedure(s) with comments: EXCISION MORTON'S NEUROMA (Left) - IV WITH LOCAL  Patient Location: PACU  Anesthesia Type: MAC  Level of Consciousness: awake, alert  and patient cooperative  Airway and Oxygen Therapy: Patient Spontanous Breathing and Patient connected to supplemental oxygen  Post-op Assessment: Post-op Vital signs reviewed, Patient's Cardiovascular Status Stable, Respiratory Function Stable, Patent Airway and No signs of Nausea or vomiting  Post-op Vital Signs: Reviewed and stable  Complications: No apparent anesthesia complications

## 2016-10-08 ENCOUNTER — Encounter: Payer: Self-pay | Admitting: Podiatry

## 2016-10-09 LAB — SURGICAL PATHOLOGY

## 2016-11-18 ENCOUNTER — Ambulatory Visit (INDEPENDENT_AMBULATORY_CARE_PROVIDER_SITE_OTHER): Payer: Medicare PPO | Admitting: Psychiatry

## 2016-11-18 ENCOUNTER — Encounter: Payer: Self-pay | Admitting: Psychiatry

## 2016-11-18 ENCOUNTER — Telehealth: Payer: Self-pay

## 2016-11-18 VITALS — BP 140/86 | HR 87 | Temp 97.8°F | Wt 183.0 lb

## 2016-11-18 DIAGNOSIS — F3160 Bipolar disorder, current episode mixed, unspecified: Secondary | ICD-10-CM | POA: Diagnosis not present

## 2016-11-18 MED ORDER — ZIPRASIDONE HCL 60 MG PO CAPS: 60.0000 mg | ORAL_CAPSULE | Freq: Every evening | ORAL | 1 refills | Status: DC

## 2016-11-18 MED ORDER — GABAPENTIN 100 MG PO CAPS: 100.0000 mg | ORAL_CAPSULE | Freq: Three times a day (TID) | ORAL | 1 refills | Status: DC

## 2016-11-18 MED ORDER — ZIPRASIDONE HCL 20 MG PO CAPS: 20.0000 mg | ORAL_CAPSULE | Freq: Two times a day (BID) | ORAL | 1 refills | Status: DC

## 2016-11-18 NOTE — Telephone Encounter (Signed)
noted 

## 2016-11-18 NOTE — Progress Notes (Signed)
BH MD/PA/NP OP Progress Note  11/18/2016 9:58 AM Stacy Chambers  MRN:  [id number]  Subjective:    Patient is a 51 year old female with history of bipolar disorder who presented for the follow-up appointment. She reported that she has been having issues and has been feeling more anxious since brother-in-law has moved into their house. She feels anxious all the time. She reported that she tried the Klonopin and the BuSpar but they did not help. She has started gaining weight on the BuSpar. She wants to take some other medications to help with his anxiety. She reported that she has been taking Geodon on a regular basis. She is not having any side effects. She appeared pleasant and cooperative during the interview.  Discussed about starting her on Neurontin for anxiety and she agreed with the plan. She currently denied having any suicidal homicidal ideations or plans. . She is also trying to lose weight at this time.    She is taking melatonin to help her sleep at night.     Chief Complaint:  Chief Complaint    Follow-up; Medication Refill     Visit Diagnosis:   No diagnosis found.  Past Medical History:  Past Medical History:  Diagnosis Date  . Anxiety   . Bipolar disorder ([place])   . Breast mass 01/2015   left breast 11 oc  . 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 COPD (chronic obstructive pulmonary disease) (HCC)    no inhalers  . Elevated liver function tests 11/26/2004   H/O NEGATIVE LIVER BIOPSY, workup negative  . Hyperlipidemia   . Lower back pain   . Osteoarthritis   . Ovarian cancer ([place]) 1995  . Restless legs syndrome (RLS)   . RLS (restless legs syndrome)   . Wears dentures    full upper and lower    Past Surgical History:  Procedure Laterality Date  . ABDOMINAL HYSTERECTOMY    . APPENDECTOMY    . CESAREAN SECTION    . EXCISION MORTON'S NEUROMA Left 10/07/2016   Procedure: EXCISION MORTON'S NEUROMA;  Surgeon: [name], DPM;  Location: [facility];   Service: Podiatry;  Laterality: Left;  IV WITH LOCAL  . LIVER BIOPSY  2005   PerPt: Done to eval elevated LFTs and biopsy provided no definite dx/cause of elevated LFTs  . MULTIPLE TOOTH EXTRACTIONS    . OOPHORECTOMY     Family History:  Family History  Problem Relation Age of Onset  . Adopted: Yes  . COPD Mother   . Depression Mother   . Colon cancer Mother     unsure of age  . Cancer Father     Lung CA, Skin Cancer--?type  . COPD Father   . Stroke Father   . Cancer Sister 12    cervical cancer  . COPD Brother   . Alcohol abuse Brother   . Stroke Brother   . COPD Maternal Aunt   . Depression Daughter   . Schizophrenia Son    Social History:  Social History   Social History  . Marital status: Married    Spouse name: N/A  . Number of children: N/A  . Years of education: N/A   Social History Main Topics  . Smoking status: Former Smoker    Types: Cigarettes    Quit date: 04/08/2009  . Smokeless tobacco: Never Used  . Alcohol use 1.8 - 4.2 oz/week    3 - 7 Shots of liquor per week     Comment:  occasionally  . Drug use: Yes    Types: Marijuana     Comment: told to refrain until after surgery  . Sexual activity: Yes    Birth control/ protection: Surgical   Other Topics Concern  . None   Social History Narrative  . None   Additional History:   Lives with her husband.  Assessment:   Musculoskeletal: Strength & Muscle Tone: within normal limits Gait & Station: normal Patient leans: N/A  Psychiatric Specialty Exam: Medication Refill  Pertinent negatives include no chills, neck pain or vomiting.  Depression         Associated symptoms include does not have insomnia.  Past medical history includes anxiety.   Drug Problem  Pertinent negatives include no hallucinations. Pertinent negatives include no vomiting.  Anxiety  Patient reports no insomnia or palpitations.      Review of Systems  Constitutional: Negative for chills and malaise/fatigue.  HENT:  Negative for nosebleeds and tinnitus.   Eyes: Negative for photophobia.  Respiratory: Negative for hemoptysis.   Cardiovascular: Negative for palpitations.  Gastrointestinal: Negative for vomiting.  Genitourinary: Negative for urgency.  Musculoskeletal: Positive for back pain and joint pain. Negative for neck pain.  Skin: Negative for itching.  Neurological: Negative for tremors.  Endo/Heme/Allergies: Negative for environmental allergies.  Psychiatric/Behavioral: Positive for depression. Negative for hallucinations. The patient does not have insomnia.     Blood pressure 140/86, pulse 87, temperature 97.8 F (36.6 C), temperature source Oral, weight 183 lb (83 kg).Body mass index is 31.41 kg/m.  General Appearance: Neat  Eye Contact:  Fair  Speech:  Normal Rate  Volume:  Normal  Mood:  Anxious  Affect:  Congruent  Thought Process:  Goal Directed  Orientation:  Full (Time, Place, and Person)  Thought Content:  WDL  Suicidal Thoughts:  No  Homicidal Thoughts:  No  Memory:  Immediate;   Fair  Judgement:  Fair  Insight:  Fair  Psychomotor Activity:  Normal  Concentration:  Fair  Recall:  [employer] of Knowledge: Fair  Language: Fair  Akathisia:  No  Handed:  Right  AIMS (if indicated):  none  Assets:  Communication Skills Desire for Improvement Social Support  ADL's:  Intact  Cognition: WNL  Sleep:  6-7    Is the patient at risk to self?  No. Has the patient been a risk to self in the past 6 months?  No. Has the patient been a risk to self within the distant past?  No. Is the patient a risk to others?  No. Has the patient been a risk to others in the past 6 months?  No. Has the patient been a risk to others within the distant past?  No.  Current Medications: Current Outpatient Prescriptions  Medication Sig Dispense Refill  . alendronate (FOSAMAX) 70 MG tablet Take 1 tablet (70 mg total) by mouth once a week. Take with a full glass of water on an empty stomach. 12  tablet 3  . BIOTIN PO Take by mouth daily.    . Cholecalciferol (VITAMIN D PO) Take by mouth daily.    . clonazePAM (KLONOPIN) 0.5 MG tablet Take 1 tablet (0.5 mg total) by mouth at bedtime. (Patient taking differently: Take 0.5 mg by mouth 3 (three) times daily. ) 30 tablet 1  . Cyanocobalamin (VITAMIN B12 PO) Take by mouth daily.    Marland Kitchen esomeprazole (NEXIUM) 10 MG packet Take 10 mg by mouth daily as needed.    . lubiprostone (AMITIZA) 24  MCG capsule Take 1 capsule (24 mcg total) by mouth 2 (two) times daily with a meal. 180 capsule 3  . Melatonin 5 MG TABS Take 1 tablet by mouth at bedtime as needed.    . meloxicam (MOBIC) 7.5 MG tablet TAKE 1 TABLET EVERY DAY 30 tablet 6  . oxyCODONE-acetaminophen (ROXICET) 5-325 MG tablet Take 1-2 tablets by mouth every 4 (four) hours as needed. 30 tablet 0  . rosuvastatin (CRESTOR) 10 MG tablet TAKE 1 TABLET EVERY DAY 90 tablet 0  . ziprasidone (GEODON) 20 MG capsule Take 1 capsule (20 mg total) by mouth 2 (two) times daily with a meal. 180 capsule 1  . ziprasidone (GEODON) 60 MG capsule Take 1 capsule (60 mg total) by mouth every evening. 90 capsule 1   No current facility-administered medications for this visit.     Medical Decision Making:  Established Problem, Stable/Improving (1), Review and summation of old records (2) and Review or order medicine tests (1)  Treatment Plan Summary:Medication management   Discussed with patient about the medications and Will make changes to her medications as follows. Continue  Geodon 60 mg po qhs  Continue  Geodon 20 mg by mouth twice a day  Will start her on Neurontin 100 mg by mouth 3 times a day and advised her to take 100 mg daily and gradually titrate the dose and she agreed with the plan. Advised to start taking biotin and vitamin D on a regular basis  Medications refilled through [facility] in 1 month    More than 50% of the time spent in psychoeducation, counseling and coordination of  care.    This note was generated in part or whole with voice recognition software. Voice regonition is usually quite accurate but there are transcription errors that can and very often do occur. I apologize for any typographical errors that were not detected and corrected.    Rainey Pines, MD  11/18/2016, 9:58 AM

## 2016-11-18 NOTE — Telephone Encounter (Signed)
pt called states she doesn't want to take the gabapentine you gave her.  It causes weight gain, increased depression and she doesn't want to try medication.

## 2016-11-19 ENCOUNTER — Ambulatory Visit: Payer: Medicare PPO | Admitting: Psychiatry

## 2016-11-25 ENCOUNTER — Telehealth: Payer: Self-pay | Admitting: Psychiatry

## 2016-11-25 NOTE — Telephone Encounter (Signed)
Please call pt and ask her what she would like ?

## 2016-11-30 ENCOUNTER — Other Ambulatory Visit: Payer: Self-pay | Admitting: Psychiatry

## 2016-11-30 MED ORDER — CLONAZEPAM 0.5 MG PO TABS: 0.5000 mg | ORAL_TABLET | Freq: Every day | ORAL | 1 refills | Status: DC | PRN

## 2016-11-30 NOTE — Telephone Encounter (Signed)
pt called states she wants klonopin. pt states that the other medication made her bleed.

## 2016-11-30 NOTE — Progress Notes (Unsigned)
klonopin

## 2016-11-30 NOTE — Telephone Encounter (Signed)
Klonopin 0.5 mg qdaily prn prdered #30- refill -1

## 2016-11-30 NOTE — Telephone Encounter (Signed)
rx faxed and confirmed for klonopin .5mg  id # [id number] order # [id number]

## 2016-12-23 ENCOUNTER — Ambulatory Visit (INDEPENDENT_AMBULATORY_CARE_PROVIDER_SITE_OTHER): Payer: Medicare PPO | Admitting: Psychiatry

## 2016-12-23 ENCOUNTER — Encounter: Payer: Self-pay | Admitting: Psychiatry

## 2016-12-23 VITALS — BP 112/73 | HR 85 | Temp 98.0°F | Wt 185.8 lb

## 2016-12-23 DIAGNOSIS — F3160 Bipolar disorder, current episode mixed, unspecified: Secondary | ICD-10-CM

## 2016-12-23 MED ORDER — TRAZODONE HCL 100 MG PO TABS: 100.0000 mg | ORAL_TABLET | Freq: Every day | ORAL | 1 refills | Status: DC

## 2016-12-23 NOTE — Progress Notes (Signed)
BH MD/PA/NP OP Progress Note  12/23/2016 9:11 AM Stacy Chambers  MRN:  [id number]  Subjective:    Patient is a 51 year old female with history of bipolar disorder who presented for the follow-up appointment. She reported that she in gaining weight and she spoke to the pharmacist who told her that it might be related to her Geodon. She was asking about decreasing the dose of Geodon. She appeared hyper and hypomanic. Discussed about her medications and advised her that I will not be decreasing the dose of her Geodon at this time. She stated that she is also not sleeping at this time. Patient is somewhat hypomanic. Discussed about starting her on trazodone to help her sleep. She agreed with the plan. She was asking to take the [place] on a when necessary basis. Advised her not to change any of her medications at this time. She  demonstrated understanding.   She is also trying to lose weight at this time.    She is taking melatonin to help her sleep at night. She will  Also be prescribed trazodone to help her sleep.   Chief Complaint:  Chief Complaint    Follow-up; Medication Refill     Visit Diagnosis:     ICD-9-CM ICD-10-CM   1. Bipolar I disorder, most recent episode mixed ([place]) 296.60 F31.60     Past Medical History:  Past Medical History:  Diagnosis Date  . Anxiety   . Bipolar disorder ([place])   . Breast mass 01/2015   left breast 11 oc  . 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 COPD (chronic obstructive pulmonary disease) (HCC)    no inhalers  . Elevated liver function tests 11/26/2004   H/O NEGATIVE LIVER BIOPSY, workup negative  . Hyperlipidemia   . Lower back pain   . Osteoarthritis   . Ovarian cancer ([place]) 1995  . Restless legs syndrome (RLS)   . RLS (restless legs syndrome)   . Wears dentures    full upper and lower    Past Surgical History:  Procedure Laterality Date  . ABDOMINAL HYSTERECTOMY    . APPENDECTOMY    . CESAREAN SECTION    . EXCISION MORTON'S NEUROMA Left  10/07/2016   Procedure: EXCISION MORTON'S NEUROMA;  Surgeon: [name], DPM;  Location: [facility];  Service: Podiatry;  Laterality: Left;  IV WITH LOCAL  . LIVER BIOPSY  2005   PerPt: Done to eval elevated LFTs and biopsy provided no definite dx/cause of elevated LFTs  . MULTIPLE TOOTH EXTRACTIONS    . OOPHORECTOMY     Family History:  Family History  Problem Relation Age of Onset  . Adopted: Yes  . COPD Mother   . Depression Mother   . Colon cancer Mother     unsure of age  . Cancer Father     Lung CA, Skin Cancer--?type  . COPD Father   . Stroke Father   . Cancer Sister 26    cervical cancer  . COPD Brother   . Alcohol abuse Brother   . Stroke Brother   . COPD Maternal Aunt   . Depression Daughter   . Schizophrenia Son    Social History:  Social History   Social History  . Marital status: Married    Spouse name: N/A  . Number of children: N/A  . Years of education: N/A   Social History Main Topics  . Smoking status: Former Smoker    Types: Cigarettes    Quit date: 04/08/2009  .  Smokeless tobacco: Never Used  . Alcohol use 1.8 - 4.2 oz/week    3 - 7 Shots of liquor per week     Comment: occasionally  . Drug use: Yes    Types: Marijuana     Comment: told to refrain until after surgery  . Sexual activity: Yes    Birth control/ protection: Surgical   Other Topics Concern  . None   Social History Narrative  . None   Additional History:   Lives with her husband.  Assessment:   Musculoskeletal: Strength & Muscle Tone: within normal limits Gait & Station: normal Patient leans: N/A  Psychiatric Specialty Exam: Medication Refill  Pertinent negatives include no chills, neck pain or vomiting.  Depression         Associated symptoms include does not have insomnia.  Past medical history includes anxiety.   Drug Problem  Pertinent negatives include no hallucinations. Pertinent negatives include no vomiting.  Anxiety  Patient reports no  insomnia or palpitations.      Review of Systems  Constitutional: Negative for chills and malaise/fatigue.  HENT: Negative for nosebleeds and tinnitus.   Eyes: Negative for photophobia.  Respiratory: Negative for hemoptysis.   Cardiovascular: Negative for palpitations.  Gastrointestinal: Negative for vomiting.  Genitourinary: Negative for urgency.  Musculoskeletal: Positive for back pain and joint pain. Negative for neck pain.  Skin: Negative for itching.  Neurological: Negative for tremors.  Endo/Heme/Allergies: Negative for environmental allergies.  Psychiatric/Behavioral: Positive for depression. Negative for hallucinations. The patient does not have insomnia.     Blood pressure 112/73, pulse 85, temperature 98 F (36.7 C), temperature source Oral, weight 185 lb 12.8 oz (84.3 kg).Body mass index is 31.89 kg/m.  General Appearance: Neat  Eye Contact:  Fair  Speech:  Normal Rate  Volume:  Normal  Mood:  Anxious  Affect:  Congruent  Thought Process:  Goal Directed  Orientation:  Full (Time, Place, and Person)  Thought Content:  WDL  Suicidal Thoughts:  No  Homicidal Thoughts:  No  Memory:  Immediate;   Fair  Judgement:  Fair  Insight:  Fair  Psychomotor Activity:  Normal  Concentration:  Fair  Recall:  [employer] of Knowledge: Fair  Language: Fair  Akathisia:  No  Handed:  Right  AIMS (if indicated):  none  Assets:  Communication Skills Desire for Improvement Social Support  ADL's:  Intact  Cognition: WNL  Sleep:  6-7    Is the patient at risk to self?  No. Has the patient been a risk to self in the past 6 months?  No. Has the patient been a risk to self within the distant past?  No. Is the patient a risk to others?  No. Has the patient been a risk to others in the past 6 months?  No. Has the patient been a risk to others within the distant past?  No.  Current Medications: Current Outpatient Prescriptions  Medication Sig Dispense Refill  . alendronate  (FOSAMAX) 70 MG tablet Take 1 tablet (70 mg total) by mouth once a week. Take with a full glass of water on an empty stomach. 12 tablet 3  . BIOTIN PO Take by mouth daily.    . Cholecalciferol (VITAMIN D PO) Take by mouth daily.    . clonazePAM (KLONOPIN) 0.5 MG tablet Take 1 tablet (0.5 mg total) by mouth daily as needed for anxiety. 30 tablet 1  . Cyanocobalamin (VITAMIN B12 PO) Take by mouth daily.    Marland Kitchen  esomeprazole (NEXIUM) 10 MG packet Take 10 mg by mouth daily as needed.    . lubiprostone (AMITIZA) 24 MCG capsule Take 1 capsule (24 mcg total) by mouth 2 (two) times daily with a meal. 180 capsule 3  . Melatonin 5 MG TABS Take 1 tablet by mouth at bedtime as needed.    . meloxicam (MOBIC) 7.5 MG tablet TAKE 1 TABLET EVERY DAY 30 tablet 6  . oxyCODONE-acetaminophen (ROXICET) 5-325 MG tablet Take 1-2 tablets by mouth every 4 (four) hours as needed. 30 tablet 0  . rosuvastatin (CRESTOR) 10 MG tablet TAKE 1 TABLET EVERY DAY 90 tablet 0  . ziprasidone (GEODON) 20 MG capsule Take 1 capsule (20 mg total) by mouth 2 (two) times daily with a meal. 180 capsule 1  . ziprasidone (GEODON) 60 MG capsule Take 1 capsule (60 mg total) by mouth every evening. 90 capsule 1   No current facility-administered medications for this visit.     Medical Decision Making:  Established Problem, Stable/Improving (1), Review and summation of old records (2) and Review or order medicine tests (1)  Treatment Plan Summary:Medication management   Discussed with patient about the medications and Will make changes to her medications as follows. Continue  Geodon 60 mg po qhs  Continue  Geodon 20 mg by mouth twice a day  I will start her on trazodone 100 mg by mouth daily at bedtime for insomnia. She is also taking melatonin.  Medications refilled through [facility] in 1 month    More than 50% of the time spent in psychoeducation, counseling and coordination of care.    This note was generated in part  or whole with voice recognition software. Voice regonition is usually quite accurate but there are transcription errors that can and very often do occur. I apologize for any typographical errors that were not detected and corrected.    Rainey Pines, MD  12/23/2016, 9:11 AM

## 2016-12-29 ENCOUNTER — Telehealth: Payer: Self-pay | Admitting: Psychiatry

## 2016-12-29 NOTE — Telephone Encounter (Signed)
Pt was called stated that her blood pressure is up.  Pt was advised to contact pcp pt states she has an appt tomorrow with pcp.

## 2016-12-30 NOTE — Telephone Encounter (Signed)
Contact her PCP

## 2017-02-04 ENCOUNTER — Other Ambulatory Visit: Payer: Self-pay | Admitting: Psychiatry

## 2017-02-08 ENCOUNTER — Other Ambulatory Visit: Payer: Self-pay | Admitting: Psychiatry

## 2017-02-24 NOTE — Telephone Encounter (Signed)
pt called again states she needs her refill on trazodone and on klonopin.  thsi is a dr. Gretel Acre pt.   Pt was last seen on  12-23-16 next appt  03-24-17.

## 2017-03-03 ENCOUNTER — Telehealth: Payer: Self-pay

## 2017-03-03 NOTE — Telephone Encounter (Signed)
pt called very upset she needs her medications refilled . pt very mad.  pt last seen on  12-23-16 next appt  03-24-17

## 2017-03-04 NOTE — Telephone Encounter (Signed)
pt called again pt states she needs enough medication to do until she can see dr. Gretel Acre.  pt has appt on  03-24-17 pt needs klonopin, trazodone, geodone 20mg  and geondon 60mg 

## 2017-03-04 NOTE — Telephone Encounter (Signed)
Please contact Dr. Gretel Acre.

## 2017-03-04 NOTE — Telephone Encounter (Signed)
Pt called again and is very upset . She wants you to call her

## 2017-03-12 MED ORDER — TRAZODONE HCL 100 MG PO TABS: 100.0000 mg | ORAL_TABLET | Freq: Every day | ORAL | 0 refills | Status: DC

## 2017-03-12 MED ORDER — CLONAZEPAM 0.5 MG PO TABS: 0.5000 mg | ORAL_TABLET | Freq: Every day | ORAL | 0 refills | Status: DC | PRN

## 2017-03-12 NOTE — Telephone Encounter (Signed)
Yes please, go ahead and send it over

## 2017-03-12 NOTE — Telephone Encounter (Signed)
Medication problem - Telephone call with patient to follow up on a message she had left at the [place] office that she had been calling [facility] trying to get a refill of Klonopin and Trazodone until sees Dr. Gretel Acre 03/24/17. Patient states she has been having a lot of problems with sleep and anxiety and that Dr. Gretel Acre did not give her enough medication until she returns 03/24/17.  States she does have enough of both Geodon orders to last until that appointment time but requests a provider assist her with Klonopin and Trazodone, at least enough until she sees Dr. Gretel Acre on 03/24/17.  Patient requests if can get new orders these be called into [facility] in [place] on [address] due to it would take some time before medication would come from [facility].  Agreed to see if any other providers may assist patient with orders in the absence of Dr. Gretel Acre and will call patient back once discussed.

## 2017-03-12 NOTE — Telephone Encounter (Signed)
Met with Dr. Daron Offer to discuss patient's calls to the [place] office for what she reported over 3 weeks with no follow up from assigned provider and frustration she is now out of Klonopin and Trazodone medications.  States her anxiety is worse and not sleeping at night and requests at least enough medication until she returns to see Dr. Gretel Acre on 03/24/17.  Informed patient was upset she was not given enoug h medication to last between appointments.  Dr. Daron Offer provided verbal orders for 30 days for a refill of patient's Clonazepam and Trazodone.  Called both orders into [facility]'s [facility] in [place] as requested by patient with Hilliard Clark, pharmacist there and then called patient to inform orders were approved and called in.  Also, reminded patient she did not need to curse at reception staff or CMA at the [facility] office and that this would not be acceptable behavior.  Requested patient be respectful of staff and if concerns could call this office clinical manager to discuss concerns but would not allow ongoing abuse to staff.  Patient stated understanding and apologized for cursing at staff and behavior.  Stated she had just gotten frustrated her need for medication had not been met and she agreed to request provider make sure her orders covered enough after 03/24/17 appointment until she returns again.  Patient to call this clinical manager back if any further issues or concerns.

## 2017-03-17 ENCOUNTER — Other Ambulatory Visit (HOSPITAL_COMMUNITY): Payer: Self-pay

## 2017-03-17 ENCOUNTER — Other Ambulatory Visit: Payer: Self-pay | Admitting: Family Medicine

## 2017-03-17 DIAGNOSIS — Z1231 Encounter for screening mammogram for malignant neoplasm of breast: Secondary | ICD-10-CM

## 2017-03-17 NOTE — Telephone Encounter (Signed)
Pt was last seen in feb and she was supposed to f/u in 1 month. However she did not make any follow up any appts. She has received her trazodone through [facility] mail order and she is not on Klonopin.

## 2017-03-17 NOTE — Telephone Encounter (Signed)
Agreed. She has been provided a prescription for clonazepam consistently since June 2017.  Abruptly discontinuing could be deadly.  If the patient needs to be tapered off of clonazepam, this should be worked out between her and primary psychiatrist.

## 2017-03-24 ENCOUNTER — Encounter: Payer: Self-pay | Admitting: Psychiatry

## 2017-03-24 ENCOUNTER — Ambulatory Visit (INDEPENDENT_AMBULATORY_CARE_PROVIDER_SITE_OTHER): Payer: Medicare PPO | Admitting: Psychiatry

## 2017-03-24 VITALS — BP 123/80 | HR 79 | Temp 98.1°F | Wt 188.0 lb

## 2017-03-24 DIAGNOSIS — F3160 Bipolar disorder, current episode mixed, unspecified: Secondary | ICD-10-CM

## 2017-03-24 MED ORDER — TRAZODONE HCL 100 MG PO TABS: 100.0000 mg | ORAL_TABLET | Freq: Every day | ORAL | 0 refills | Status: DC

## 2017-03-24 MED ORDER — ZIPRASIDONE HCL 20 MG PO CAPS: 20.0000 mg | ORAL_CAPSULE | Freq: Two times a day (BID) | ORAL | 1 refills | Status: DC

## 2017-03-24 MED ORDER — CLONAZEPAM 0.5 MG PO TABS: 0.5000 mg | ORAL_TABLET | Freq: Every day | ORAL | 0 refills | Status: DC | PRN

## 2017-03-24 MED ORDER — ZIPRASIDONE HCL 60 MG PO CAPS: 60.0000 mg | ORAL_CAPSULE | Freq: Every evening | ORAL | 1 refills | Status: DC

## 2017-03-24 MED ORDER — HYDROXYZINE HCL 10 MG PO TABS: 10.0000 mg | ORAL_TABLET | Freq: Three times a day (TID) | ORAL | 1 refills | Status: DC | PRN

## 2017-03-24 NOTE — Progress Notes (Signed)
BH MD/PA/NP OP Progress Note  03/24/2017 9:57 AM Stacy Chambers  MRN:  [id number]  Subjective:    Patient is a 51 year old female with history of bipolar disorder who presented for the follow-up appointment. She reported that she in gaining weight and she was tearful and reported that she wants to stop all her medications. Discussed with the patient about her Klonopin. She reported that she takes Klonopin as she has started taking it more than prescribed. She was supposed to take it on a when necessary basis and she reported that she called the office to get the refill on the medication as she has been driving on a daily basis. Patient reported that she wants to stop the Geodon. Patient stated that she has been losing her hair and she has tried taking several vitamins and has had her thyroid hormone recently checked and it was within normal limits. She reported that she does not take any other medications. We discussed about stopping her Geodon. She reported that her husband has already threatened her that if she will stop taking her medications he will move out to another apartment. She remains skeptical about her medications. She stated that she feels anxious but does not want to go higher on the dose of the Klonopin as it is an addictive medication. Patient stated that she wants to try something else for her anxiety. I offered her Vistaril and she agreed with the plan. She currently denied having any suicidal homicidal ideations or plans.     She has recently filled her Klonopin one week ago..   Chief Complaint:  Chief Complaint    Follow-up; Medication Refill     Visit Diagnosis:     ICD-9-CM ICD-10-CM   1. Bipolar I disorder, most recent episode mixed ([place]) 296.60 F31.60     Past Medical History:  Past Medical History:  Diagnosis Date  . Anxiety   . Bipolar disorder ([place])   . Breast mass 01/2015   left breast 11 oc  . 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 COPD (chronic obstructive  pulmonary disease) (HCC)    no inhalers  . Elevated liver function tests 11/26/2004   H/O NEGATIVE LIVER BIOPSY, workup negative  . Hyperlipidemia   . Lower back pain   . Osteoarthritis   . Ovarian cancer ([place]) 1995  . Restless legs syndrome (RLS)   . RLS (restless legs syndrome)   . Wears dentures    full upper and lower    Past Surgical History:  Procedure Laterality Date  . ABDOMINAL HYSTERECTOMY    . APPENDECTOMY    . CESAREAN SECTION    . EXCISION MORTON'S NEUROMA Left 10/07/2016   Procedure: EXCISION MORTON'S NEUROMA;  Surgeon: [name], DPM;  Location: [facility];  Service: Podiatry;  Laterality: Left;  IV WITH LOCAL  . LIVER BIOPSY  2005   PerPt: Done to eval elevated LFTs and biopsy provided no definite dx/cause of elevated LFTs  . MULTIPLE TOOTH EXTRACTIONS    . OOPHORECTOMY     Family History:  Family History  Problem Relation Age of Onset  . Adopted: Yes  . COPD Mother   . Depression Mother   . Colon cancer Mother        unsure of age  . Cancer Father        Lung CA, Skin Cancer--?type  . COPD Father   . Stroke Father   . Cancer Sister 29       cervical cancer  .  COPD Brother   . Alcohol abuse Brother   . Stroke Brother   . COPD Maternal Aunt   . Depression Daughter   . Schizophrenia Son    Social History:  Social History   Social History  . Marital status: Married    Spouse name: N/A  . Number of children: N/A  . Years of education: N/A   Social History Main Topics  . Smoking status: Former Smoker    Types: Cigarettes    Quit date: 04/08/2009  . Smokeless tobacco: Never Used  . Alcohol use 1.8 - 4.2 oz/week    3 - 7 Shots of liquor per week     Comment: occasionally  . Drug use: Yes    Types: Marijuana     Comment: told to refrain until after surgery  . Sexual activity: Yes    Birth control/ protection: Surgical   Other Topics Concern  . None   Social History Narrative  . None   Additional History:   Lives with her  husband.  Assessment:   Musculoskeletal: Strength & Muscle Tone: within normal limits Gait & Station: normal Patient leans: N/A  Psychiatric Specialty Exam: Medication Refill  Pertinent negatives include no chills, neck pain or vomiting.  Depression         Associated symptoms include does not have insomnia.  Past medical history includes anxiety.   Drug Problem  Pertinent negatives include no hallucinations. Pertinent negatives include no vomiting.  Anxiety  Patient reports no insomnia or palpitations.      Review of Systems  Constitutional: Negative for chills and malaise/fatigue.  HENT: Negative for nosebleeds and tinnitus.   Eyes: Negative for photophobia.  Respiratory: Negative for hemoptysis.   Cardiovascular: Negative for palpitations.  Gastrointestinal: Negative for vomiting.  Genitourinary: Negative for urgency.  Musculoskeletal: Positive for back pain and joint pain. Negative for neck pain.  Skin: Negative for itching.  Neurological: Negative for tremors.  Endo/Heme/Allergies: Negative for environmental allergies.  Psychiatric/Behavioral: Positive for depression. Negative for hallucinations. The patient does not have insomnia.     Blood pressure 123/80, pulse 79, temperature 98.1 F (36.7 C), temperature source Oral, weight 188 lb (85.3 kg).Body mass index is 32.27 kg/m.  General Appearance: Neat  Eye Contact:  Fair  Speech:  Normal Rate  Volume:  Normal  Mood:  Anxious  Affect:  Congruent  Thought Process:  Goal Directed  Orientation:  Full (Time, Place, and Person)  Thought Content:  WDL  Suicidal Thoughts:  No  Homicidal Thoughts:  No  Memory:  Immediate;   Fair  Judgement:  Fair  Insight:  Fair  Psychomotor Activity:  Normal  Concentration:  Fair  Recall:  [employer] of Knowledge: Fair  Language: Fair  Akathisia:  No  Handed:  Right  AIMS (if indicated):  none  Assets:  Communication Skills Desire for Improvement Social Support  ADL's:   Intact  Cognition: WNL  Sleep:  6-7    Is the patient at risk to self?  No. Has the patient been a risk to self in the past 6 months?  No. Has the patient been a risk to self within the distant past?  No. Is the patient a risk to others?  No. Has the patient been a risk to others in the past 6 months?  No. Has the patient been a risk to others within the distant past?  No.  Current Medications: Current Outpatient Prescriptions  Medication Sig Dispense Refill  . alendronate (FOSAMAX)  70 MG tablet Take 1 tablet (70 mg total) by mouth once a week. Take with a full glass of water on an empty stomach. 12 tablet 3  . BIOTIN PO Take by mouth daily.    . Cholecalciferol (VITAMIN D PO) Take by mouth daily.    . clonazePAM (KLONOPIN) 0.5 MG tablet Take 1 tablet (0.5 mg total) by mouth daily as needed for anxiety. 30 tablet 0  . Cyanocobalamin (VITAMIN B12 PO) Take by mouth daily.    Marland Kitchen esomeprazole (NEXIUM) 10 MG packet Take 10 mg by mouth daily as needed.    . lubiprostone (AMITIZA) 24 MCG capsule Take 1 capsule (24 mcg total) by mouth 2 (two) times daily with a meal. 180 capsule 3  . Melatonin 5 MG TABS Take 1 tablet by mouth at bedtime as needed.    . meloxicam (MOBIC) 7.5 MG tablet TAKE 1 TABLET EVERY DAY 30 tablet 6  . oxyCODONE-acetaminophen (ROXICET) 5-325 MG tablet Take 1-2 tablets by mouth every 4 (four) hours as needed. 30 tablet 0  . rosuvastatin (CRESTOR) 10 MG tablet TAKE 1 TABLET EVERY DAY 90 tablet 0  . traZODone (DESYREL) 100 MG tablet Take 1 tablet (100 mg total) by mouth at bedtime. 90 tablet 0  . ziprasidone (GEODON) 20 MG capsule Take 1 capsule (20 mg total) by mouth 2 (two) times daily with a meal. 180 capsule 1  . ziprasidone (GEODON) 60 MG capsule Take 1 capsule (60 mg total) by mouth every evening. 90 capsule 1  . hydrOXYzine (ATARAX/VISTARIL) 10 MG tablet Take 1 tablet (10 mg total) by mouth 3 (three) times daily as needed. 90 tablet 1   No current facility-administered  medications for this visit.     Medical Decision Making:  Established Problem, Stable/Improving (1), Review and summation of old records (2) and Review or order medicine tests (1)  Treatment Plan Summary:Medication management   Discussed with patient about the medications and Will make changes to her medications as follows. Continue  Geodon 60 mg po qhs  Advised her to stop taking Geodon 20 mg twice daily. It was refilled again as patient will change her mind as usual and will start calling the office in one week looking for the refill of the medication. Refilled Klonopin and trazodone. I will start her on Vistaril 10 mg by mouth 3 times a day for anxiety-sent to the local pharmacy Follow-up in one month or earlier depending on her symptoms  Follow-up in 1 month    More than 50% of the time spent in psychoeducation, counseling and coordination of care.    This note was generated in part or whole with voice recognition software. Voice regonition is usually quite accurate but there are transcription errors that can and very often do occur. I apologize for any typographical errors that were not detected and corrected.    Rainey Pines, MD  03/24/2017, 9:57 AM

## 2017-04-08 ENCOUNTER — Other Ambulatory Visit: Payer: Self-pay | Admitting: Psychiatry

## 2017-04-09 ENCOUNTER — Other Ambulatory Visit (HOSPITAL_COMMUNITY): Payer: Self-pay | Admitting: Psychiatry

## 2017-04-09 ENCOUNTER — Ambulatory Visit
Admission: RE | Admit: 2017-04-09 | Discharge: 2017-04-09 | Disposition: A | Discharge status: Home / Self Care | Payer: Medicare PPO | Source: Ambulatory Visit | Attending: Family Medicine | Admitting: Family Medicine

## 2017-04-09 DIAGNOSIS — Z1231 Encounter for screening mammogram for malignant neoplasm of breast: Secondary | ICD-10-CM

## 2017-04-09 NOTE — Telephone Encounter (Signed)
Refill request. Not my patient.

## 2017-04-14 ENCOUNTER — Ambulatory Visit: Payer: Medicare PPO | Admitting: Physician Assistant

## 2017-04-21 ENCOUNTER — Ambulatory Visit (INDEPENDENT_AMBULATORY_CARE_PROVIDER_SITE_OTHER): Payer: Medicare PPO | Admitting: Psychiatry

## 2017-04-21 ENCOUNTER — Encounter: Payer: Self-pay | Admitting: Psychiatry

## 2017-04-21 ENCOUNTER — Telehealth: Payer: Self-pay

## 2017-04-21 VITALS — BP 120/73 | HR 72 | Temp 98.7°F | Wt 187.6 lb

## 2017-04-21 DIAGNOSIS — F3160 Bipolar disorder, current episode mixed, unspecified: Secondary | ICD-10-CM | POA: Diagnosis not present

## 2017-04-21 MED ORDER — CLONAZEPAM 0.5 MG PO TABS: 0.5000 mg | ORAL_TABLET | Freq: Every day | ORAL | 2 refills | Status: DC | PRN

## 2017-04-21 MED ORDER — HYDROXYZINE HCL 10 MG PO TABS: 10.0000 mg | ORAL_TABLET | Freq: Three times a day (TID) | ORAL | 1 refills | Status: DC | PRN

## 2017-04-21 MED ORDER — TRAZODONE HCL 100 MG PO TABS: 100.0000 mg | ORAL_TABLET | Freq: Every day | ORAL | 0 refills | Status: DC

## 2017-04-21 NOTE — Telephone Encounter (Signed)
rx for klonopin was faxed and confirmed id # [id number] order # [id number]

## 2017-04-21 NOTE — Progress Notes (Signed)
BH MD/PA/NP OP Progress Note  04/21/2017 9:55 AM Stacy Chambers  MRN:  [id number]  Subjective:    Patient is a 52 year old female with history of bipolar disorder who presented for the follow-up appointment. She reported that she continues to feel anxious and went to her primary care physician who told her that her thyroid is within normal limits. She reported that she has tried hydroxyzine and felt that it is causing weight gain and hair loss. I printed the information about the hydroxyzine and it did not say any of these side effects.  Patient reported that she also tried to find another psychiatrist but nobody else is accepting her insurance. She also tried to stop her medication Geodon for a couple of weeks but it was not helpful and she became more agitated.  Patient remains manipulative as usual and has been changing her medications without discussion. Advised patient that  she will continue changing her meds  she needs to find another psychiatrist and she reported that she has been looking around.   Advised patient that we will not change any medications at this time. She agreed with the plan. She will be given the same medications and no medication adjustments will be made at this time.      Chief Complaint:  Chief Complaint    Follow-up; Medication Refill     Visit Diagnosis:     ICD-10-CM   1. Bipolar I disorder, most recent episode mixed ([place]) F31.60     Past Medical History:  Past Medical History:  Diagnosis Date  . Anxiety   . Bipolar disorder ([place])   . 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 COPD (chronic obstructive pulmonary disease) (HCC)    no inhalers  . Elevated liver function tests 11/26/2004   H/O NEGATIVE LIVER BIOPSY, workup negative  . Hyperlipidemia   . Lower back pain   . Osteoarthritis   . Ovarian cancer ([place]) 1995  . Restless legs syndrome (RLS)   . RLS (restless legs syndrome)   . Wears dentures    full upper and lower    Past Surgical History:   Procedure Laterality Date  . ABDOMINAL HYSTERECTOMY    . APPENDECTOMY    . BREAST BIOPSY Left    neg- core  . CESAREAN SECTION    . EXCISION MORTON'S NEUROMA Left 10/07/2016   Procedure: EXCISION MORTON'S NEUROMA;  Surgeon: [name], DPM;  Location: [facility];  Service: Podiatry;  Laterality: Left;  IV WITH LOCAL  . LIVER BIOPSY  2005   PerPt: Done to eval elevated LFTs and biopsy provided no definite dx/cause of elevated LFTs  . MULTIPLE TOOTH EXTRACTIONS    . OOPHORECTOMY     Family History:  Family History  Problem Relation Age of Onset  . Adopted: Yes  . COPD Mother   . Depression Mother   . Colon cancer Mother        unsure of age  . Cancer Father        Lung CA, Skin Cancer--?type  . COPD Father   . Stroke Father   . Cancer Sister 11       cervical cancer  . COPD Brother   . Alcohol abuse Brother   . Stroke Brother   . COPD Maternal Aunt   . Depression Daughter   . Schizophrenia Son   . Breast cancer Neg Hx    Social History:  Social History   Social History  . Marital status: Married  Spouse name: N/A  . Number of children: N/A  . Years of education: N/A   Social History Main Topics  . Smoking status: Former Smoker    Types: Cigarettes    Quit date: 04/08/2009  . Smokeless tobacco: Never Used  . Alcohol use 1.8 - 4.2 oz/week    3 - 7 Shots of liquor per week     Comment: occasionally  . Drug use: Yes    Types: Marijuana     Comment: told to refrain until after surgery  . Sexual activity: Yes    Birth control/ protection: Surgical   Other Topics Concern  . None   Social History Narrative  . None   Additional History:   Lives with her husband.  Assessment:   Musculoskeletal: Strength & Muscle Tone: within normal limits Gait & Station: normal Patient leans: N/A  Psychiatric Specialty Exam: Medication Refill  Pertinent negatives include no chills, neck pain or vomiting.  Depression         Associated symptoms include  does not have insomnia.  Past medical history includes anxiety.   Drug Problem  Pertinent negatives include no hallucinations. Pertinent negatives include no vomiting.  Anxiety  Patient reports no insomnia or palpitations.      Review of Systems  Constitutional: Negative for chills and malaise/fatigue.  HENT: Negative for nosebleeds and tinnitus.   Eyes: Negative for photophobia.  Respiratory: Negative for hemoptysis.   Cardiovascular: Negative for palpitations.  Gastrointestinal: Negative for vomiting.  Genitourinary: Negative for urgency.  Musculoskeletal: Positive for back pain and joint pain. Negative for neck pain.  Skin: Negative for itching.  Neurological: Negative for tremors.  Endo/Heme/Allergies: Negative for environmental allergies.  Psychiatric/Behavioral: Positive for depression. Negative for hallucinations. The patient does not have insomnia.     Blood pressure 120/73, pulse 72, temperature 98.7 F (37.1 C), temperature source Oral, weight 187 lb 9.6 oz (85.1 kg).Body mass index is 32.2 kg/m.  General Appearance: Neat  Eye Contact:  Fair  Speech:  Normal Rate  Volume:  Normal  Mood:  Anxious  Affect:  Congruent  Thought Process:  Goal Directed  Orientation:  Full (Time, Place, and Person)  Thought Content:  WDL  Suicidal Thoughts:  No  Homicidal Thoughts:  No  Memory:  Immediate;   Fair  Judgement:  Fair  Insight:  Fair  Psychomotor Activity:  Normal  Concentration:  Fair  Recall:  [employer] of Knowledge: Fair  Language: Fair  Akathisia:  No  Handed:  Right  AIMS (if indicated):  none  Assets:  Communication Skills Desire for Improvement Social Support  ADL's:  Intact  Cognition: WNL  Sleep:  6-7    Is the patient at risk to self?  No. Has the patient been a risk to self in the past 6 months?  No. Has the patient been a risk to self within the distant past?  No. Is the patient a risk to others?  No. Has the patient been a risk to others in the  past 6 months?  No. Has the patient been a risk to others within the distant past?  No.  Current Medications: Current Outpatient Prescriptions  Medication Sig Dispense Refill  . alendronate (FOSAMAX) 70 MG tablet Take 1 tablet (70 mg total) by mouth once a week. Take with a full glass of water on an empty stomach. 12 tablet 3  . BIOTIN PO Take by mouth daily.    . Cholecalciferol (VITAMIN D PO) Take by mouth  daily.    . clonazePAM (KLONOPIN) 0.5 MG tablet Take 1 tablet (0.5 mg total) by mouth daily as needed for anxiety. 30 tablet 2  . esomeprazole (NEXIUM) 10 MG packet Take 10 mg by mouth daily as needed.    . hydrOXYzine (ATARAX/VISTARIL) 10 MG tablet Take 1 tablet (10 mg total) by mouth 3 (three) times daily as needed. 270 tablet 1  . lubiprostone (AMITIZA) 24 MCG capsule Take 1 capsule (24 mcg total) by mouth 2 (two) times daily with a meal. 180 capsule 3  . meloxicam (MOBIC) 7.5 MG tablet TAKE 1 TABLET EVERY DAY 30 tablet 6  . oxyCODONE-acetaminophen (ROXICET) 5-325 MG tablet Take 1-2 tablets by mouth every 4 (four) hours as needed. 30 tablet 0  . rosuvastatin (CRESTOR) 10 MG tablet TAKE 1 TABLET EVERY DAY 90 tablet 0  . traZODone (DESYREL) 100 MG tablet Take 1 tablet (100 mg total) by mouth at bedtime. 90 tablet 0  . ziprasidone (GEODON) 20 MG capsule Take 1 capsule (20 mg total) by mouth 2 (two) times daily with a meal. 180 capsule 1  . ziprasidone (GEODON) 60 MG capsule Take 1 capsule (60 mg total) by mouth every evening. 90 capsule 1   No current facility-administered medications for this visit.     Medical Decision Making:  Established Problem, Stable/Improving (1), Review and summation of old records (2) and Review or order medicine tests (1)  Treatment Plan Summary:Medication management   Discussed with patient about the medications and will not change any of her medications.   Continue  Geodon 60 mg po qhs  Continue Geodon 20 mg twice daily. Refilled Klonopin and  trazodone. 3 month supply. I will start her on Vistaril 10 mg by mouth 3 times a day for anxiety-3 month supply Follow-up in 3 month   Patient was given written side effects of the hydroxyzine and it did not show any weight gain or hair loss.   More than 50% of the time spent in psychoeducation, counseling and coordination of care.    This note was generated in part or whole with voice recognition software. Voice regonition is usually quite accurate but there are transcription errors that can and very often do occur. I apologize for any typographical errors that were not detected and corrected.    Rainey Pines, MD  04/21/2017, 9:55 AM

## 2017-04-27 ENCOUNTER — Other Ambulatory Visit: Payer: Self-pay | Admitting: Psychiatry

## 2017-04-27 ENCOUNTER — Telehealth: Payer: Self-pay | Admitting: Psychiatry

## 2017-05-08 ENCOUNTER — Other Ambulatory Visit (HOSPITAL_COMMUNITY): Payer: Self-pay | Admitting: Psychiatry

## 2017-07-19 ENCOUNTER — Ambulatory Visit (INDEPENDENT_AMBULATORY_CARE_PROVIDER_SITE_OTHER): Payer: Medicare PPO | Admitting: Psychiatry

## 2017-07-19 ENCOUNTER — Encounter: Payer: Self-pay | Admitting: Psychiatry

## 2017-07-19 VITALS — BP 132/78 | HR 75 | Temp 97.9°F | Wt 194.0 lb

## 2017-07-19 DIAGNOSIS — F3160 Bipolar disorder, current episode mixed, unspecified: Secondary | ICD-10-CM | POA: Diagnosis not present

## 2017-07-19 MED ORDER — ZIPRASIDONE HCL 60 MG PO CAPS: 60.0000 mg | ORAL_CAPSULE | Freq: Every evening | ORAL | 1 refills | Status: DC

## 2017-07-19 MED ORDER — CLONAZEPAM 0.5 MG PO TABS
0.5000 mg | ORAL_TABLET | Freq: Every day | ORAL | 2 refills | Status: DC | PRN
Start: 2017-07-19 — End: 2017-10-11

## 2017-07-19 MED ORDER — HYDROXYZINE HCL 10 MG PO TABS: 10.0000 mg | ORAL_TABLET | Freq: Three times a day (TID) | ORAL | 1 refills | Status: DC | PRN

## 2017-07-19 MED ORDER — TRAZODONE HCL 100 MG PO TABS: 100.0000 mg | ORAL_TABLET | Freq: Every day | ORAL | 2 refills | Status: DC

## 2017-07-19 NOTE — Telephone Encounter (Signed)
Pt was seen today by dr. Gretel Acre.

## 2017-07-19 NOTE — Progress Notes (Signed)
BH MD/PA/NP OP Progress Note  07/19/2017 9:06 AM Stacy Chambers  MRN:  [id number]  Subjective:    Patient is a 51 year old female with history of bipolar disorder who presented for the follow-up appointment. She was last seen in June. Patient reported that she has gained a significant amount of weight and has been losing her hair. She was apprehensive during the interview. She reported that she is active during the daytime and has been trying to stay healthy and eat less. She reported that her primary care is trying to test her for the thyroid problems. However her primary care is a new physician. We discussed about having her check for her thyroid problems as her medications will not cause her problems with her weight gain. She appeared tired and depressed during the interview. She denied having any suicidal ideations or plans. I wrote down her lab work which she will have done at her PCP office. She agreed with the plan. She does not want to change her medications at this time.      Chief Complaint:  Chief Complaint    Follow-up; Medication Refill     Visit Diagnosis:   No diagnosis found.  Past Medical History:  Past Medical History:  Diagnosis Date  . Anxiety   . Bipolar disorder ([place])   . Cancer ([place]) 1995   uterine  . COPD (chronic obstructive pulmonary disease) (HCC)    no inhalers  . Elevated liver function tests 11/26/2004   H/O NEGATIVE LIVER BIOPSY, workup negative  . Hyperlipidemia   . Lower back pain   . Osteoarthritis   . Ovarian cancer ([place]) 1995  . Restless legs syndrome (RLS)   . RLS (restless legs syndrome)   . Wears dentures    full upper and lower    Past Surgical History:  Procedure Laterality Date  . ABDOMINAL HYSTERECTOMY    . APPENDECTOMY    . BREAST BIOPSY Left    neg- core  . CESAREAN SECTION    . EXCISION MORTON'S NEUROMA Left 10/07/2016   Procedure: EXCISION MORTON'S NEUROMA;  Surgeon: [name], DPM;  Location: [facility];   Service: Podiatry;  Laterality: Left;  IV WITH LOCAL  . LIVER BIOPSY  2005   PerPt: Done to eval elevated LFTs and biopsy provided no definite dx/cause of elevated LFTs  . MULTIPLE TOOTH EXTRACTIONS    . OOPHORECTOMY     Family History:  Family History  Problem Relation Age of Onset  . Adopted: Yes  . COPD Mother   . Depression Mother   . Colon cancer Mother        unsure of age  . Cancer Father        Lung CA, Skin Cancer--?type  . COPD Father   . Stroke Father   . Cancer Sister 40       cervical cancer  . COPD Brother   . Alcohol abuse Brother   . Stroke Brother   . COPD Maternal Aunt   . Depression Daughter   . Schizophrenia Son   . Breast cancer Neg Hx    Social History:  Social History   Social History  . Marital status: Married    Spouse name: N/A  . Number of children: N/A  . Years of education: N/A   Social History Main Topics  . Smoking status: Former Smoker    Types: Cigarettes    Quit date: 04/08/2009  . Smokeless tobacco: Never Used  . Alcohol use 1.8 -  4.2 oz/week    3 - 7 Shots of liquor per week     Comment: occasionally  . Drug use: Yes    Types: Marijuana     Comment: told to refrain until after surgery  . Sexual activity: Yes    Birth control/ protection: Surgical   Other Topics Concern  . None   Social History Narrative  . None   Additional History:   Lives with her husband.  Assessment:   Musculoskeletal: Strength & Muscle Tone: within normal limits Gait & Station: normal Patient leans: N/A  Psychiatric Specialty Exam: Medication Refill  Pertinent negatives include no chills, neck pain or vomiting.  Depression         Associated symptoms include does not have insomnia.  Past medical history includes anxiety.   Drug Problem  Pertinent negatives include no hallucinations. Pertinent negatives include no vomiting.  Anxiety  Patient reports no insomnia or palpitations.      Review of Systems  Constitutional: Negative for  chills and malaise/fatigue.  HENT: Negative for nosebleeds and tinnitus.   Eyes: Negative for photophobia.  Respiratory: Negative for hemoptysis.   Cardiovascular: Negative for palpitations.  Gastrointestinal: Negative for vomiting.  Genitourinary: Negative for urgency.  Musculoskeletal: Positive for back pain and joint pain. Negative for neck pain.  Skin: Negative for itching.  Neurological: Negative for tremors.  Endo/Heme/Allergies: Negative for environmental allergies.  Psychiatric/Behavioral: Positive for depression. Negative for hallucinations. The patient does not have insomnia.     Blood pressure 132/78, pulse 75, temperature 97.9 F (36.6 C), temperature source Oral, weight 194 lb (88 kg).Body mass index is 33.3 kg/m.  General Appearance: Neat  Eye Contact:  Fair  Speech:  Normal Rate  Volume:  Normal  Mood:  Anxious  Affect:  Congruent  Thought Process:  Goal Directed  Orientation:  Full (Time, Place, and Person)  Thought Content:  WDL  Suicidal Thoughts:  No  Homicidal Thoughts:  No  Memory:  Immediate;   Fair  Judgement:  Fair  Insight:  Fair  Psychomotor Activity:  Normal  Concentration:  Fair  Recall:  [employer] of Knowledge: Fair  Language: Fair  Akathisia:  No  Handed:  Right  AIMS (if indicated):  none  Assets:  Communication Skills Desire for Improvement Social Support  ADL's:  Intact  Cognition: WNL  Sleep:  6-7    Is the patient at risk to self?  No. Has the patient been a risk to self in the past 6 months?  No. Has the patient been a risk to self within the distant past?  No. Is the patient a risk to others?  No. Has the patient been a risk to others in the past 6 months?  No. Has the patient been a risk to others within the distant past?  No.  Current Medications: Current Outpatient Prescriptions  Medication Sig Dispense Refill  . alendronate (FOSAMAX) 70 MG tablet Take 1 tablet (70 mg total) by mouth once a week. Take with a full glass of  water on an empty stomach. 12 tablet 3  . BIOTIN PO Take by mouth daily.    . Cholecalciferol (VITAMIN D PO) Take by mouth daily.    . clonazePAM (KLONOPIN) 0.5 MG tablet Take 1 tablet (0.5 mg total) by mouth daily as needed for anxiety. 30 tablet 2  . esomeprazole (NEXIUM) 10 MG packet Take 10 mg by mouth daily as needed.    . hydrOXYzine (ATARAX/VISTARIL) 10 MG tablet Take 1  tablet (10 mg total) by mouth 3 (three) times daily as needed. 270 tablet 1  . lubiprostone (AMITIZA) 24 MCG capsule Take 1 capsule (24 mcg total) by mouth 2 (two) times daily with a meal. 180 capsule 3  . meloxicam (MOBIC) 7.5 MG tablet TAKE 1 TABLET EVERY DAY 30 tablet 6  . oxyCODONE-acetaminophen (ROXICET) 5-325 MG tablet Take 1-2 tablets by mouth every 4 (four) hours as needed. 30 tablet 0  . rosuvastatin (CRESTOR) 10 MG tablet TAKE 1 TABLET EVERY DAY 90 tablet 0  . traZODone (DESYREL) 100 MG tablet Take 1 tablet (100 mg total) by mouth at bedtime. 90 tablet 0  . ziprasidone (GEODON) 20 MG capsule Take 1 capsule (20 mg total) by mouth 2 (two) times daily with a meal. 180 capsule 1  . ziprasidone (GEODON) 60 MG capsule Take 1 capsule (60 mg total) by mouth every evening. 90 capsule 1   No current facility-administered medications for this visit.     Medical Decision Making:  Established Problem, Stable/Improving (1), Review and summation of old records (2) and Review or order medicine tests (1)  Treatment Plan Summary:Medication management   Discussed with patient about the medications and will not change any of her medications.   Continue  Geodon 60 mg po qhs  Continue Geodon 20 mg twice daily.-She has enough supply. Refilled Klonopin and trazodone. 3 month supply. I will start her on Vistaril 10 mg by mouth 3 times a day for anxiety-3 month supply She was advised to just have her thyroid panel as well as a vitamin D level iron and folic acid and G99 level done. Follow-up in 1 month   Patient was given written  side effects of the hydroxyzine and it did not show any weight gain or hair loss.   More than 50% of the time spent in psychoeducation, counseling and coordination of care.    This note was generated in part or whole with voice recognition software. Voice regonition is usually quite accurate but there are transcription errors that can and very often do occur. I apologize for any typographical errors that were not detected and corrected.    Rainey Pines, MD  07/19/2017, 9:06 AM

## 2017-07-19 NOTE — Telephone Encounter (Signed)
faxed and confirmed rx for klonopin .5mg  id # [id number] order # [id number]

## 2017-07-20 ENCOUNTER — Other Ambulatory Visit: Payer: Self-pay | Admitting: Psychiatry

## 2017-07-22 ENCOUNTER — Other Ambulatory Visit (HOSPITAL_COMMUNITY): Payer: Self-pay | Admitting: Psychiatry

## 2017-07-22 MED ORDER — TRAZODONE HCL 100 MG PO TABS: 100.0000 mg | ORAL_TABLET | Freq: Every day | ORAL | 0 refills | Status: DC

## 2017-07-22 NOTE — Telephone Encounter (Signed)
Ordered

## 2017-07-22 NOTE — Telephone Encounter (Signed)
This is a Dr. Gretel Acre patient. pt called left message that dr. Gretel Acre did not send in her trazodone  pt was seen on  07-19-17 next appt 08-23-17.  traZODone (DESYREL) 100 MG tablet  Medication  Date: 07/19/2017 Department: [facility] Ordering/Authorizing: Rainey Pines, MD  Order Providers   Prescribing Provider Encounter Provider  Rainey Pines, MD Rainey Pines, MD  Medication Detail    Disp Refills Start End   traZODone (DESYREL) 100 MG tablet 90 tablet 2 07/19/2017    Sig - Route: Take 1 tablet (100 mg total) by mouth at bedtime. - Oral   Class: Phone In   [place], [address] [facility] [address]   It was not phoned in.

## 2017-08-23 ENCOUNTER — Ambulatory Visit: Payer: Medicare PPO | Admitting: Psychiatry

## 2017-09-13 ENCOUNTER — Encounter: Payer: Self-pay | Admitting: Internal Medicine

## 2017-09-13 ENCOUNTER — Other Ambulatory Visit
Admission: RE | Admit: 2017-09-13 | Discharge: 2017-09-13 | Disposition: A | Discharge status: Home / Self Care | Payer: Medicare PPO | Source: Ambulatory Visit | Attending: Internal Medicine | Admitting: Internal Medicine

## 2017-09-13 ENCOUNTER — Other Ambulatory Visit: Payer: Self-pay | Admitting: *Deleted

## 2017-09-13 ENCOUNTER — Ambulatory Visit: Payer: Medicare PPO | Admitting: Internal Medicine

## 2017-09-13 VITALS — BP 110/88 | HR 92 | Resp 16 | Ht 64.0 in | Wt 195.0 lb

## 2017-09-13 DIAGNOSIS — R0609 Other forms of dyspnea: Secondary | ICD-10-CM

## 2017-09-13 DIAGNOSIS — R0600 Dyspnea, unspecified: Secondary | ICD-10-CM

## 2017-09-13 LAB — CBC WITH DIFFERENTIAL/PLATELET
BASOS PCT: 1 %
Basophils Absolute: 0 10*3/uL (ref 0–0.1)
EOS ABS: 0.1 10*3/uL (ref 0–0.7)
Eosinophils Relative: 1 %
HCT: 34.3 % — ABNORMAL LOW (ref 35.0–47.0)
HEMOGLOBIN: 11.8 g/dL — AB (ref 12.0–16.0)
Lymphocytes Relative: 35 %
Lymphs Abs: 2.3 10*3/uL (ref 1.0–3.6)
MCH: 29.8 pg (ref 26.0–34.0)
MCHC: 34.2 g/dL (ref 32.0–36.0)
MCV: 87.2 fL (ref 80.0–100.0)
Monocytes Absolute: 0.4 10*3/uL (ref 0.2–0.9)
Monocytes Relative: 6 %
NEUTROS ABS: 3.8 10*3/uL (ref 1.4–6.5)
NEUTROS PCT: 57 %
Platelets: 229 10*3/uL (ref 150–440)
RBC: 3.94 MIL/uL (ref 3.80–5.20)
RDW: 13.2 % (ref 11.5–14.5)
WBC: 6.7 10*3/uL (ref 3.6–11.0)

## 2017-09-13 MED ORDER — FLUTICASONE PROPIONATE HFA 220 MCG/ACT IN AERO: 2.0000 | INHALATION_SPRAY | Freq: Two times a day (BID) | RESPIRATORY_TRACT | 0 refills | Status: DC

## 2017-09-13 MED ORDER — FLUTICASONE FUROATE 100 MCG/ACT IN AEPB: 2.0000 | INHALATION_SPRAY | Freq: Two times a day (BID) | RESPIRATORY_TRACT | 5 refills | Status: DC

## 2017-09-13 NOTE — Progress Notes (Signed)
[facility] Consultation      Assessment and Plan:  This is a 51 year old former smoker presents with progressive exertional dyspnea.  Dyspnea on exertion. - Progressive, likely multifactorial from COPD and/or obesity with approximately 50 pound weight gain in the last 1 year.  COPD. -Patient is not on current inhalers regularly, also her HFA technique was inadequate, proper technique was demonstrated today. - In order to confirm the diagnosis of COPD we will send the patient for pulmonary function testing.  We will also be checking a CBC with differential, chest x-ray. -Patient notes dyspnea with inhalers, I had her take the [facility] today.  What she was feeling was actually a side effect of jitteriness caused by the LABA component, which made her heart feel that it was beating faster and her dyspnea increased. - I therefore asked her to stop taking Advair and Bevespi, instead started her on a steroid inhaler to be used 2 puffs twice daily. -If positive would consider alpha-1 antitrypsin testing, as well as referral for CT lung cancer screening at the age of 52.  Obesity. - She has gained approximately 50 pounds in the last 1 year.  We discussed that this may be contributing to her dyspnea, and weight loss would help.  Meds ordered this encounter  Medications  . DISCONTD: ADVAIR DISKUS 250-50 MCG/DOSE AEPB    Sig: Inhale 1 puff 2 (two) times daily into the lungs.  Marland Kitchen DISCONTD: BEVESPI AEROSPHERE 9-4.8 MCG/ACT AERO    Sig: Inhale 1 puff 2 (two) times daily into the lungs.  . fexofenadine (ALLEGRA) 180 MG tablet    Sig: Take 180 mg daily by mouth.  . Fluticasone Furoate 100 MCG/ACT AEPB    Sig: Inhale 2 puffs 2 (two) times daily into the lungs.    Dispense:  30 each    Refill:  5   Orders Placed This Encounter  Procedures  . DG Chest 2 View  . CBC with Differential/Platelet  . Pulmonary Function Test [facility] Only   Return in about 4 weeks (around  10/11/2017).    Date: 09/13/2017  MRN# [id number] Stacy Chambers Dec 14, 1965  Referring Physician:   LITISHA GUAGLIARDO is a 51 y.o. old female seen in consultation for chief complaint of:    Chief Complaint  Patient presents with  . Advice Only    Referred by [facility] ([place] FNP)  . Shortness of Breath    with and without exhertion/dizziness  . COPD    HPI:   The patient is a 51 year old female, her medical history includes uterine cancer, COPD, restless leg syndrome.  She breathes heavy when she is active such as walking in from the parking in from the parking lot, she can walk a [place]. She can carry a load of laundry without issue.  She has dyspnea with walking stairs, she could do those things a year ago but not anymore. She has in 1 year gained 50 lbs which she attributes to medication.  She quit smoking in 2010, her husband smokes, her parents smoked and died of COPD.  She is taking advair once daily but does not feel that it helps. She takes bevespi one puff once daily, and this does help either.  She does not have a rescue inhaler.  She does have occasional heartburn, she has occasional rhintis.   Her husband says that she snores loudly.   Desat walk 09/13/17; baseline o2 sat on ra at rest was 98% and HR 67. After  walking 360 feet she has mild dyspnea but states she is very dyspneic. Her sat was 98% and HR 101.  PMHX:   Past Medical History:  Diagnosis Date  . Anxiety   . Bipolar disorder ([place])   . Cancer ([place]) 1995   uterine  . COPD (chronic obstructive pulmonary disease) (HCC)    no inhalers  . Elevated liver function tests 11/26/2004   H/O NEGATIVE LIVER BIOPSY, workup negative  . Hyperlipidemia   . Lower back pain   . Osteoarthritis   . Ovarian cancer ([place]) 1995  . Restless legs syndrome (RLS)   . RLS (restless legs syndrome)   . Wears dentures    full upper and lower   Surgical Hx:  Past Surgical History:  Procedure Laterality Date   . ABDOMINAL HYSTERECTOMY    . APPENDECTOMY    . BREAST BIOPSY Left    neg- core  . CESAREAN SECTION    . EXCISION MORTON'S NEUROMA Left 10/07/2016   Performed by [name], DPM at [facility]  . LIVER BIOPSY  2005   PerPt: Done to eval elevated LFTs and biopsy provided no definite dx/cause of elevated LFTs  . MULTIPLE TOOTH EXTRACTIONS    . OOPHORECTOMY     Family Hx:  Family History  Adopted: Yes  Problem Relation Age of Onset  . COPD Mother   . Depression Mother   . Colon cancer Mother        unsure of age  . Cancer Father        Lung CA, Skin Cancer--?type  . COPD Father   . Stroke Father   . Cancer Sister 94       cervical cancer  . COPD Brother   . Alcohol abuse Brother   . Stroke Brother   . COPD Maternal Aunt   . Depression Daughter   . Schizophrenia Son   . Breast cancer Neg Hx    Social Hx:   Social History   Tobacco Use  . Smoking status: Former Smoker    Packs/day: 1.00    Years: 30.00    Pack years: 30.00    Types: Cigarettes    Last attempt to quit: 04/08/2009    Years since quitting: 8.4  . Smokeless tobacco: Never Used  Substance Use Topics  . Alcohol use: Yes    Alcohol/week: 1.8 - 4.2 oz    Types: 3 - 7 Shots of liquor per week    Comment: occasionally  . Drug use: Yes    Types: Marijuana    Comment: told to refrain until after surgery   Medication:    Current Outpatient Medications:  .  alendronate (FOSAMAX) 70 MG tablet, Take 1 tablet (70 mg total) by mouth once a week. Take with a full glass of water on an empty stomach., Disp: 12 tablet, Rfl: 3 .  BEVESPI AEROSPHERE 9-4.8 MCG/ACT AERO, Inhale 1 puff 2 (two) times daily into the lungs., Disp: , Rfl:  .  BIOTIN PO, Take by mouth daily., Disp: , Rfl:  .  Cholecalciferol (VITAMIN D PO), Take by mouth daily., Disp: , Rfl:  .  clonazePAM (KLONOPIN) 0.5 MG tablet, Take 1 tablet (0.5 mg total) by mouth daily as needed for anxiety., Disp: 30 tablet, Rfl: 2 .  esomeprazole  (NEXIUM) 10 MG packet, Take 10 mg by mouth daily as needed., Disp: , Rfl:  .  fexofenadine (ALLEGRA) 180 MG tablet, Take 180 mg daily by mouth., Disp: , Rfl:  .  hydrOXYzine (ATARAX/VISTARIL) 10 MG tablet, Take 1 tablet (10 mg total) by mouth 3 (three) times daily as needed., Disp: 270 tablet, Rfl: 1 .  lubiprostone (AMITIZA) 24 MCG capsule, Take 1 capsule (24 mcg total) by mouth 2 (two) times daily with a meal., Disp: 180 capsule, Rfl: 3 .  meloxicam (MOBIC) 7.5 MG tablet, TAKE 1 TABLET EVERY DAY, Disp: 30 tablet, Rfl: 6 .  rosuvastatin (CRESTOR) 10 MG tablet, TAKE 1 TABLET EVERY DAY, Disp: 90 tablet, Rfl: 0 .  traZODone (DESYREL) 100 MG tablet, Take 1 tablet (100 mg total) by mouth at bedtime., Disp: 90 tablet, Rfl: 0 .  ziprasidone (GEODON) 20 MG capsule, Take 1 capsule (20 mg total) by mouth 2 (two) times daily with a meal., Disp: 180 capsule, Rfl: 1 .  ziprasidone (GEODON) 60 MG capsule, Take 1 capsule (60 mg total) by mouth every evening., Disp: 90 capsule, Rfl: 1 .  ADVAIR DISKUS 250-50 MCG/DOSE AEPB, Inhale 1 puff 2 (two) times daily into the lungs., Disp: , Rfl:  .  oxyCODONE-acetaminophen (ROXICET) 5-325 MG tablet, Take 1-2 tablets by mouth every 4 (four) hours as needed. (Patient not taking: Reported on 09/13/2017), Disp: 30 tablet, Rfl: 0   Allergies:  Benadryl [diphenhydramine hcl] and Vicodin [hydrocodone-acetaminophen]  Review of Systems: Gen:  Denies  fever, sweats, chills HEENT: Denies blurred vision, double vision. bleeds, sore throat Cvc:  No dizziness, chest pain. Resp:   Denies cough or sputum production, shortness of breath Gi: Denies swallowing difficulty, stomach pain. Gu:  Denies bladder incontinence, burning urine Ext:   No Joint pain, stiffness. Skin: No skin rash,  hives  Endoc:  No polyuria, polydipsia. Psych: No depression, insomnia. Other:  All other systems were reviewed with the patient and were negative other that what is mentioned in the HPI.   Physical  Examination:   VS: BP 110/88 (BP Location: Left Arm, Cuff Size: Normal)   Pulse 92   Resp 16   Ht 5\' 4"  (1.626 m)   Wt 195 lb (88.5 kg)   SpO2 98%   BMI 33.47 kg/m   General Appearance: No distress  Neuro:without focal findings,  speech normal,  HEENT: PERRLA, EOM intact.   Pulmonary: normal breath sounds, No wheezing.  CardiovascularNormal S1,S2.  No m/r/g.   Abdomen: Benign, Soft, non-tender. Renal:  No costovertebral tenderness  GU:  No performed at this time. Endoc: No evident thyromegaly, no signs of acromegaly. Skin:   warm, no rashes, no ecchymosis  Extremities: normal, no cyanosis, clubbing.  Other findings:    LABORATORY PANEL:   CBC No results for input(s): WBC, HGB, HCT, PLT in the last 168 hours. ------------------------------------------------------------------------------------------------------------------  Chemistries  No results for input(s): NA, K, CL, CO2, GLUCOSE, BUN, CREATININE, CALCIUM, MG, AST, ALT, ALKPHOS, BILITOT in the last 168 hours.  Invalid input(s): GFRCGP ------------------------------------------------------------------------------------------------------------------  Cardiac Enzymes No results for input(s): TROPONINI in the last 168 hours. ------------------------------------------------------------  RADIOLOGY:  No results found.     Thank  you for the consultation and for allowing [facility] to assist in the care of your patient. Our recommendations are noted above.  Please contact us if we can be of further service.   Marda Stalker, MD.  Board Certified in Internal Medicine, Pulmonary Medicine, [place], and Sleep Medicine.  [facility] Office Number: [phone]  Patricia Pesa, M.D.  Merton Border, M.D  09/13/2017

## 2017-09-13 NOTE — Patient Instructions (Signed)
--  Weight loss would help your breathing.   --Stop current inhalers.   --Start new inhaler 2 puffs twice daily.

## 2017-10-07 ENCOUNTER — Ambulatory Visit: Payer: No Typology Code available for payment source | Attending: Internal Medicine

## 2017-10-11 ENCOUNTER — Other Ambulatory Visit: Payer: Self-pay

## 2017-10-11 ENCOUNTER — Encounter: Payer: Self-pay | Admitting: Psychiatry

## 2017-10-11 ENCOUNTER — Other Ambulatory Visit: Payer: Self-pay | Admitting: Psychiatry

## 2017-10-11 ENCOUNTER — Ambulatory Visit (INDEPENDENT_AMBULATORY_CARE_PROVIDER_SITE_OTHER): Payer: Medicare PPO | Admitting: Psychiatry

## 2017-10-11 ENCOUNTER — Telehealth: Payer: Self-pay

## 2017-10-11 VITALS — BP 115/77 | HR 88 | Temp 98.0°F | Wt 199.4 lb

## 2017-10-11 DIAGNOSIS — F3160 Bipolar disorder, current episode mixed, unspecified: Secondary | ICD-10-CM | POA: Diagnosis not present

## 2017-10-11 MED ORDER — ZIPRASIDONE HCL 60 MG PO CAPS: 60.0000 mg | ORAL_CAPSULE | Freq: Every evening | ORAL | 1 refills | Status: DC

## 2017-10-11 MED ORDER — CLONAZEPAM 0.5 MG PO TABS: 0.5000 mg | ORAL_TABLET | Freq: Every day | ORAL | 2 refills | Status: DC | PRN

## 2017-10-11 MED ORDER — BENZTROPINE MESYLATE 0.5 MG PO TABS: 0.5000 mg | ORAL_TABLET | Freq: Every day | ORAL | 1 refills | Status: DC

## 2017-10-11 MED ORDER — HYDROXYZINE HCL 10 MG PO TABS: 10.0000 mg | ORAL_TABLET | Freq: Two times a day (BID) | ORAL | 1 refills | Status: DC

## 2017-10-11 MED ORDER — ZIPRASIDONE HCL 20 MG PO CAPS: 20.0000 mg | ORAL_CAPSULE | Freq: Two times a day (BID) | ORAL | 1 refills | Status: DC

## 2017-10-11 MED ORDER — TRAZODONE HCL 100 MG PO TABS: 100.0000 mg | ORAL_TABLET | Freq: Every day | ORAL | 1 refills | Status: DC

## 2017-10-11 NOTE — Telephone Encounter (Signed)
faxed and confirmed rx for klonopin.5mg  id # [id number] order # [id number] #30 with 2 refills faxed to [facility]

## 2017-10-11 NOTE — Progress Notes (Deleted)
[facility] Consultation      Assessment and Plan:  This is a 51 year old former smoker presents with progressive exertional dyspnea.  Dyspnea on exertion. - Progressive, likely multifactorial from COPD and/or obesity with approximately 50 pound weight gain in the last 1 year.  COPD. -Patient is not on current inhalers regularly, also her HFA technique was inadequate, proper technique was demonstrated today. - In order to confirm the diagnosis of COPD we will send the patient for pulmonary function testing.  We will also be checking a CBC with differential, chest x-ray. -Patient notes dyspnea with inhalers, I had her take the [facility] today.  What she was feeling was actually a side effect of jitteriness caused by the LABA component, which made her heart feel that it was beating faster and her dyspnea increased. - I therefore asked her to stop taking Advair and Bevespi, instead started her on a steroid inhaler to be used 2 puffs twice daily. -If positive would consider alpha-1 antitrypsin testing, as well as referral for CT lung cancer screening at the age of 58.  Obesity. - She has gained approximately 50 pounds in the last 1 year.  We discussed that this may be contributing to her dyspnea, and weight loss would help.  No orders of the defined types were placed in this encounter.  No orders of the defined types were placed in this encounter.  No Follow-up on file.    Date: 10/11/2017  MRN# [id number] Stacy Chambers 07/31/1966  Referring Physician:   TINYA Chambers is a 51 y.o. old female seen in consultation for chief complaint of:    No chief complaint on file.   HPI:   The patient is a 51 year old female, her medical history includes uterine cancer, COPD, restless leg syndrome.  At her last visit she was noticing jitteriness with inhaled bronchodilators which included Bevespi and Advair.  She was therefore switched to an inhaled steroid only.  She was  sent for a pulmonary function test, chest x-ray, CBC with differential.  She does have occasional heartburn, she has occasional rhintis.   Her husband says that she snores loudly.   Desat walk 09/13/17; baseline o2 sat on ra at rest was 98% and HR 67. After walking 360 feet she has mild dyspnea but states she is very dyspneic. Her sat was 98% and HR 101.  PMHX:   Past Medical History:  Diagnosis Date  . Anxiety   . Bipolar disorder ([place])   . Cancer ([place]) 1995   uterine  . COPD (chronic obstructive pulmonary disease) (HCC)    no inhalers  . Elevated liver function tests 11/26/2004   H/O NEGATIVE LIVER BIOPSY, workup negative  . Hyperlipidemia   . Lower back pain   . Osteoarthritis   . Ovarian cancer ([place]) 1995  . Restless legs syndrome (RLS)   . RLS (restless legs syndrome)   . Wears dentures    full upper and lower   Surgical Hx:  Past Surgical History:  Procedure Laterality Date  . ABDOMINAL HYSTERECTOMY    . APPENDECTOMY    . BREAST BIOPSY Left    neg- core  . CESAREAN SECTION    . EXCISION MORTON'S NEUROMA Left 10/07/2016   Procedure: EXCISION MORTON'S NEUROMA;  Surgeon: [name], DPM;  Location: [facility];  Service: Podiatry;  Laterality: Left;  IV WITH LOCAL  . LIVER BIOPSY  2005   PerPt: Done to eval elevated LFTs and biopsy provided no definite dx/cause  of elevated LFTs  . MULTIPLE TOOTH EXTRACTIONS    . OOPHORECTOMY     Family Hx:  Family History  Adopted: Yes  Problem Relation Age of Onset  . COPD Mother   . Depression Mother   . Colon cancer Mother        unsure of age  . Cancer Father        Lung CA, Skin Cancer--?type  . COPD Father   . Stroke Father   . Cancer Sister 63       cervical cancer  . COPD Brother   . Alcohol abuse Brother   . Stroke Brother   . COPD Maternal Aunt   . Depression Daughter   . Schizophrenia Son   . Breast cancer Neg Hx    Social Hx:   Social History   Tobacco Use  . Smoking status: Former Smoker     Packs/day: 1.00    Years: 30.00    Pack years: 30.00    Types: Cigarettes    Last attempt to quit: 04/08/2009    Years since quitting: 8.5  . Smokeless tobacco: Never Used  Substance Use Topics  . Alcohol use: Yes    Alcohol/week: 1.8 - 4.2 oz    Types: 3 - 7 Shots of liquor per week    Comment: occasionally  . Drug use: Yes    Types: Marijuana    Comment: told to refrain until after surgery   Medication:    Current Outpatient Medications:  .  alendronate (FOSAMAX) 70 MG tablet, Take 1 tablet (70 mg total) by mouth once a week. Take with a full glass of water on an empty stomach., Disp: 12 tablet, Rfl: 3 .  BIOTIN PO, Take by mouth daily., Disp: , Rfl:  .  Cholecalciferol (VITAMIN D PO), Take by mouth daily., Disp: , Rfl:  .  clonazePAM (KLONOPIN) 0.5 MG tablet, Take 1 tablet (0.5 mg total) by mouth daily as needed for anxiety., Disp: 30 tablet, Rfl: 2 .  esomeprazole (NEXIUM) 10 MG packet, Take 10 mg by mouth daily as needed., Disp: , Rfl:  .  fexofenadine (ALLEGRA) 180 MG tablet, Take 180 mg daily by mouth., Disp: , Rfl:  .  fluticasone (FLOVENT HFA) 220 MCG/ACT inhaler, Inhale 2 puffs 2 (two) times daily into the lungs., Disp: 3 Inhaler, Rfl: 0 .  Fluticasone Furoate 100 MCG/ACT AEPB, Inhale 2 puffs 2 (two) times daily into the lungs., Disp: 30 each, Rfl: 5 .  hydrOXYzine (ATARAX/VISTARIL) 10 MG tablet, Take 1 tablet (10 mg total) by mouth 3 (three) times daily as needed., Disp: 270 tablet, Rfl: 1 .  lubiprostone (AMITIZA) 24 MCG capsule, Take 1 capsule (24 mcg total) by mouth 2 (two) times daily with a meal., Disp: 180 capsule, Rfl: 3 .  meloxicam (MOBIC) 7.5 MG tablet, TAKE 1 TABLET EVERY DAY, Disp: 30 tablet, Rfl: 6 .  oxyCODONE-acetaminophen (ROXICET) 5-325 MG tablet, Take 1-2 tablets by mouth every 4 (four) hours as needed. (Patient not taking: Reported on 09/13/2017), Disp: 30 tablet, Rfl: 0 .  rosuvastatin (CRESTOR) 10 MG tablet, TAKE 1 TABLET EVERY DAY, Disp: 90 tablet, Rfl:  0 .  traZODone (DESYREL) 100 MG tablet, Take 1 tablet (100 mg total) by mouth at bedtime., Disp: 90 tablet, Rfl: 0 .  ziprasidone (GEODON) 20 MG capsule, Take 1 capsule (20 mg total) by mouth 2 (two) times daily with a meal., Disp: 180 capsule, Rfl: 1 .  ziprasidone (GEODON) 60 MG capsule, Take 1 capsule (  60 mg total) by mouth every evening., Disp: 90 capsule, Rfl: 1   Allergies:  Benadryl [diphenhydramine hcl] and Vicodin [hydrocodone-acetaminophen]  Review of Systems: Gen:  Denies  fever, sweats, chills HEENT: Denies blurred vision, double vision. bleeds, sore throat Cvc:  No dizziness, chest pain. Resp:   Denies cough or sputum production, shortness of breath Gi: Denies swallowing difficulty, stomach pain. Gu:  Denies bladder incontinence, burning urine Ext:   No Joint pain, stiffness. Skin: No skin rash,  hives  Endoc:  No polyuria, polydipsia. Psych: No depression, insomnia. Other:  All other systems were reviewed with the patient and were negative other that what is mentioned in the HPI.   Physical Examination:   VS: There were no vitals taken for this visit.  General Appearance: No distress  Neuro:without focal findings,  speech normal,  HEENT: PERRLA, EOM intact.   Pulmonary: normal breath sounds, No wheezing.  CardiovascularNormal S1,S2.  No m/r/g.   Abdomen: Benign, Soft, non-tender. Renal:  No costovertebral tenderness  GU:  No performed at this time. Endoc: No evident thyromegaly, no signs of acromegaly. Skin:   warm, no rashes, no ecchymosis  Extremities: normal, no cyanosis, clubbing.  Other findings:    LABORATORY PANEL:   CBC No results for input(s): WBC, HGB, HCT, PLT in the last 168 hours. ------------------------------------------------------------------------------------------------------------------  Chemistries  No results for input(s): NA, K, CL, CO2, GLUCOSE, BUN, CREATININE, CALCIUM, MG, AST, ALT, ALKPHOS, BILITOT in the last 168  hours.  Invalid input(s): GFRCGP ------------------------------------------------------------------------------------------------------------------  Cardiac Enzymes No results for input(s): TROPONINI in the last 168 hours. ------------------------------------------------------------  RADIOLOGY:  No results found.     Thank  you for the consultation and for allowing [facility] to assist in the care of your patient. Our recommendations are noted above.  Please contact us if we can be of further service.   Marda Stalker, MD.  Board Certified in Internal Medicine, Pulmonary Medicine, [place], and Sleep Medicine.  [facility] Office Number: [phone]  Patricia Pesa, M.D.  Merton Border, M.D  10/11/2017

## 2017-10-11 NOTE — Progress Notes (Signed)
BH MD/PA/NP OP Progress Note  10/11/2017 11:06 AM Stacy Chambers  MRN:  [id number]  Subjective:    Patient is a 51 year old female with history of bipolar disorder who presented for the follow-up appointment. She remains focused on her weight gain and has been eating sausage and eggs  and [place] sandwiches during the lunch time. Patient reported that she likes eating and does not want change her diet. We discussed about eating healthy but she is not interested at this time. She was asking me if there is any steroids in the Geodon. She stated that she has been buying Christmas presents for her family. She does not have any manic symptoms at this time. She reported that her husband is not gaining any weight and she was trying to compare herself to her husband although he does not eat as much. She reported that she has stopped taking all the vitamins as she thought that the vitamins are making hungry and she was eating more. She reported that she sleeps well at night. She remains pleasant and cooperative during the interview. No acute symptoms noted at this time. She has been compliant with her medications. She denied having any suicidal homicidal ideations or plans.          Chief Complaint:  Chief Complaint    Follow-up; Medication Refill     Visit Diagnosis:     ICD-10-CM   1. Bipolar I disorder, most recent episode mixed ([place]) F31.60     Past Medical History:  Past Medical History:  Diagnosis Date  . Anxiety   . Bipolar disorder ([place])   . Cancer ([place]) 1995   uterine  . COPD (chronic obstructive pulmonary disease) (HCC)    no inhalers  . Elevated liver function tests 11/26/2004   H/O NEGATIVE LIVER BIOPSY, workup negative  . Hyperlipidemia   . Lower back pain   . Osteoarthritis   . Ovarian cancer ([place]) 1995  . Restless legs syndrome (RLS)   . RLS (restless legs syndrome)   . Wears dentures    full upper and lower    Past Surgical History:  Procedure Laterality Date  .  ABDOMINAL HYSTERECTOMY    . APPENDECTOMY    . BREAST BIOPSY Left    neg- core  . CESAREAN SECTION    . EXCISION MORTON'S NEUROMA Left 10/07/2016   Procedure: EXCISION MORTON'S NEUROMA;  Surgeon: [name], DPM;  Location: [facility];  Service: Podiatry;  Laterality: Left;  IV WITH LOCAL  . LIVER BIOPSY  2005   PerPt: Done to eval elevated LFTs and biopsy provided no definite dx/cause of elevated LFTs  . MULTIPLE TOOTH EXTRACTIONS    . OOPHORECTOMY     Family History:  Family History  Adopted: Yes  Problem Relation Age of Onset  . COPD Mother   . Depression Mother   . Colon cancer Mother        unsure of age  . Cancer Father        Lung CA, Skin Cancer--?type  . COPD Father   . Stroke Father   . Cancer Sister 28       cervical cancer  . COPD Brother   . Alcohol abuse Brother   . Stroke Brother   . COPD Maternal Aunt   . Depression Daughter   . Schizophrenia Son   . Breast cancer Neg Hx    Social History:  Social History   Socioeconomic History  . Marital status: Married  Spouse name: None  . Number of children: None  . Years of education: None  . Highest education level: None  Social Needs  . Financial resource strain: None  . Food insecurity - worry: None  . Food insecurity - inability: None  . Transportation needs - medical: None  . Transportation needs - non-medical: None  Occupational History  . None  Tobacco Use  . Smoking status: Former Smoker    Packs/day: 1.00    Years: 30.00    Pack years: 30.00    Types: Cigarettes    Last attempt to quit: 04/08/2009    Years since quitting: 8.5  . Smokeless tobacco: Never Used  Substance and Sexual Activity  . Alcohol use: Yes    Alcohol/week: 1.8 - 4.2 oz    Types: 3 - 7 Shots of liquor per week    Comment: occasionally  . Drug use: Yes    Types: Marijuana    Comment: told to refrain until after surgery  . Sexual activity: Yes    Birth control/protection: Surgical  Other Topics Concern  .  None  Social History Narrative  . None   Additional History:   Lives with her husband.  Assessment:   Musculoskeletal: Strength & Muscle Tone: within normal limits Gait & Station: normal Patient leans: N/A  Psychiatric Specialty Exam: Medication Refill  Pertinent negatives include no chills, neck pain or vomiting.  Depression         Associated symptoms include does not have insomnia.  Past medical history includes anxiety.   Drug Problem  Pertinent negatives include no hallucinations. Pertinent negatives include no vomiting.  Anxiety  Patient reports no insomnia or palpitations.      Review of Systems  Constitutional: Negative for chills and malaise/fatigue.  HENT: Negative for nosebleeds and tinnitus.   Eyes: Negative for photophobia.  Respiratory: Negative for hemoptysis.   Cardiovascular: Negative for palpitations.  Gastrointestinal: Negative for vomiting.  Genitourinary: Negative for urgency.  Musculoskeletal: Positive for back pain and joint pain. Negative for neck pain.  Skin: Negative for itching.  Neurological: Negative for tremors.  Endo/Heme/Allergies: Negative for environmental allergies.  Psychiatric/Behavioral: Positive for depression. Negative for hallucinations. The patient does not have insomnia.     Blood pressure 115/77, pulse 88, temperature 98 F (36.7 C), temperature source Oral, weight 199 lb 6.4 oz (90.4 kg).Body mass index is 34.23 kg/m.  General Appearance: Neat  Eye Contact:  Fair  Speech:  Normal Rate  Volume:  Normal  Mood:  Anxious  Affect:  Congruent  Thought Process:  Goal Directed  Orientation:  Full (Time, Place, and Person)  Thought Content:  WDL  Suicidal Thoughts:  No  Homicidal Thoughts:  No  Memory:  Immediate;   Fair  Judgement:  Fair  Insight:  Fair  Psychomotor Activity:  Normal  Concentration:  Fair  Recall:  [employer] of Knowledge: Fair  Language: Fair  Akathisia:  No  Handed:  Right  AIMS (if indicated):   none  Assets:  Communication Skills Desire for Improvement Social Support  ADL's:  Intact  Cognition: WNL  Sleep:  6-7    Is the patient at risk to self?  No. Has the patient been a risk to self in the past 6 months?  No. Has the patient been a risk to self within the distant past?  No. Is the patient a risk to others?  No. Has the patient been a risk to others in the past 6 months?  No. Has the patient been a risk to others within the distant past?  No.  Current Medications: Current Outpatient Medications  Medication Sig Dispense Refill  . alendronate (FOSAMAX) 70 MG tablet Take 1 tablet (70 mg total) by mouth once a week. Take with a full glass of water on an empty stomach. 12 tablet 3  . clonazePAM (KLONOPIN) 0.5 MG tablet Take 1 tablet (0.5 mg total) by mouth daily as needed for anxiety. 30 tablet 2  . esomeprazole (NEXIUM) 10 MG packet Take 10 mg by mouth daily as needed.    . fexofenadine (ALLEGRA) 180 MG tablet Take 180 mg daily by mouth.    . fluticasone (FLOVENT HFA) 220 MCG/ACT inhaler Inhale 2 puffs 2 (two) times daily into the lungs. 3 Inhaler 0  . hydrOXYzine (ATARAX/VISTARIL) 10 MG tablet Take 1 tablet (10 mg total) by mouth 2 (two) times daily. 180 tablet 1  . lubiprostone (AMITIZA) 24 MCG capsule Take 1 capsule (24 mcg total) by mouth 2 (two) times daily with a meal. 180 capsule 3  . meloxicam (MOBIC) 7.5 MG tablet TAKE 1 TABLET EVERY DAY 30 tablet 6  . rosuvastatin (CRESTOR) 10 MG tablet TAKE 1 TABLET EVERY DAY 90 tablet 0  . traZODone (DESYREL) 100 MG tablet Take 1 tablet (100 mg total) by mouth at bedtime. 90 tablet 1  . ziprasidone (GEODON) 20 MG capsule Take 1 capsule (20 mg total) by mouth 2 (two) times daily with a meal. 180 capsule 1  . ziprasidone (GEODON) 60 MG capsule Take 1 capsule (60 mg total) by mouth every evening. 90 capsule 1  . benztropine (COGENTIN) 0.5 MG tablet Take 1 tablet (0.5 mg total) by mouth daily. 90 tablet 1   No current  facility-administered medications for this visit.     Medical Decision Making:  Established Problem, Stable/Improving (1), Review and summation of old records (2) and Review or order medicine tests (1)  Treatment Plan Summary:Medication management   Discussed with patient about the medications and will not change any of her medications.   Continue  Geodon 60 mg po qhs  Continue Geodon 20 mg twice daily.-She has enough supply. Refilled Klonopin and trazodone. 3 month supply.  Vistaril 10 mg by mouth 2 times a day for anxiety-3 month supply  Follow-up in 3 month   Patient was given written side effects of the hydroxyzine and it did not show any weight gain or hair loss.   More than 50% of the time spent in psychoeducation, counseling and coordination of care.    This note was generated in part or whole with voice recognition software. Voice regonition is usually quite accurate but there are transcription errors that can and very often do occur. I apologize for any typographical errors that were not detected and corrected.    Rainey Pines, MD  10/11/2017, 11:06 AM

## 2017-10-12 ENCOUNTER — Ambulatory Visit: Payer: Medicare PPO | Admitting: Internal Medicine

## 2017-10-13 ENCOUNTER — Encounter: Payer: Self-pay | Admitting: Internal Medicine

## 2018-01-03 ENCOUNTER — Ambulatory Visit (INDEPENDENT_AMBULATORY_CARE_PROVIDER_SITE_OTHER): Payer: Medicare PPO | Admitting: Psychiatry

## 2018-01-03 ENCOUNTER — Encounter: Payer: Self-pay | Admitting: Psychiatry

## 2018-01-03 ENCOUNTER — Telehealth: Payer: Self-pay

## 2018-01-03 ENCOUNTER — Other Ambulatory Visit: Payer: Self-pay

## 2018-01-03 VITALS — BP 126/85 | HR 79 | Temp 98.4°F | Wt 202.2 lb

## 2018-01-03 DIAGNOSIS — F3160 Bipolar disorder, current episode mixed, unspecified: Secondary | ICD-10-CM | POA: Diagnosis not present

## 2018-01-03 MED ORDER — TRAZODONE HCL 100 MG PO TABS: 100.0000 mg | ORAL_TABLET | Freq: Every day | ORAL | 1 refills | Status: DC

## 2018-01-03 MED ORDER — CLONAZEPAM 0.5 MG PO TABS: 0.5000 mg | ORAL_TABLET | Freq: Every day | ORAL | 2 refills | Status: DC | PRN

## 2018-01-03 MED ORDER — BENZTROPINE MESYLATE 0.5 MG PO TABS: 0.5000 mg | ORAL_TABLET | Freq: Two times a day (BID) | ORAL | 1 refills | Status: DC

## 2018-01-03 MED ORDER — HYDROXYZINE HCL 10 MG PO TABS: 10.0000 mg | ORAL_TABLET | Freq: Two times a day (BID) | ORAL | 1 refills | Status: DC

## 2018-01-03 MED ORDER — ZIPRASIDONE HCL 60 MG PO CAPS: 60.0000 mg | ORAL_CAPSULE | Freq: Every evening | ORAL | 1 refills | Status: DC

## 2018-01-03 MED ORDER — ZIPRASIDONE HCL 20 MG PO CAPS: 20.0000 mg | ORAL_CAPSULE | Freq: Two times a day (BID) | ORAL | 1 refills | Status: DC

## 2018-01-03 NOTE — Telephone Encounter (Signed)
faxed and confirmed rx for klonopin id # [id number] order # [id number]

## 2018-01-03 NOTE — Progress Notes (Signed)
BH MD/PA/NP OP Progress Note  01/03/2018 9:14 AM Stacy Chambers  MRN:  [id number]  Subjective:    Patient is a 52 year old female with history of bipolar disorder who presented for the follow-up appointment. She remains focused on her weight gain and and reported that she does not do any physical activity. She stays at home most of the time. We discussed about her eating habits and she reported that she used allergies hemorrhages during the daytime. She reported that it is not made and it is poor.. She also reported that she does not want to walk as she has some pain in her foot. She reported that she does not like going outside and does not want to walk in the mall as well. She enjoys watching TV. She reported that she has been stable on the current combination of medications and they're helpful. She denied having any perceptual disturbances she denied having any suicidal homicidal ideations or plans. We discussed about the medications and she does not want to change the medications at this time.           Chief Complaint:  Chief Complaint    Follow-up; Medication Refill     Visit Diagnosis:   No diagnosis found.  Past Medical History:  Past Medical History:  Diagnosis Date  . Anxiety   . Bipolar disorder ([place])   . 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 COPD (chronic obstructive pulmonary disease) (HCC)    no inhalers  . Elevated liver function tests 11/26/2004   H/O NEGATIVE LIVER BIOPSY, workup negative  . Hyperlipidemia   . Lower back pain   . Osteoarthritis   . Ovarian cancer ([place]) 1995  . Restless legs syndrome (RLS)   . RLS (restless legs syndrome)   . Wears dentures    full upper and lower    Past Surgical History:  Procedure Laterality Date  . ABDOMINAL HYSTERECTOMY    . APPENDECTOMY    . BREAST BIOPSY Left    neg- core  . CESAREAN SECTION    . EXCISION MORTON'S NEUROMA Left 10/07/2016   Procedure: EXCISION MORTON'S NEUROMA;  Surgeon: [name], DPM;   Location: [facility];  Service: Podiatry;  Laterality: Left;  IV WITH LOCAL  . LIVER BIOPSY  2005   PerPt: Done to eval elevated LFTs and biopsy provided no definite dx/cause of elevated LFTs  . MULTIPLE TOOTH EXTRACTIONS    . OOPHORECTOMY     Family History:  Family History  Adopted: Yes  Problem Relation Age of Onset  . COPD Mother   . Depression Mother   . Colon cancer Mother        unsure of age  . Cancer Father        Lung CA, Skin Cancer--?type  . COPD Father   . Stroke Father   . Cancer Sister 46       cervical cancer  . COPD Brother   . Alcohol abuse Brother   . Stroke Brother   . COPD Maternal Aunt   . Depression Daughter   . Schizophrenia Son   . Breast cancer Neg Hx    Social History:  Social History   Socioeconomic History  . Marital status: Married    Spouse name: None  . Number of children: None  . Years of education: None  . Highest education level: None  Social Needs  . Financial resource strain: None  . Food insecurity - worry: None  . Food insecurity -  inability: None  . Transportation needs - medical: None  . Transportation needs - non-medical: None  Occupational History  . None  Tobacco Use  . Smoking status: Former Smoker    Packs/day: 1.00    Years: 30.00    Pack years: 30.00    Types: Cigarettes    Last attempt to quit: 04/08/2009    Years since quitting: 8.7  . Smokeless tobacco: Never Used  Substance and Sexual Activity  . Alcohol use: Yes    Alcohol/week: 1.8 - 4.2 oz    Types: 3 - 7 Shots of liquor per week    Comment: occasionally  . Drug use: Yes    Types: Marijuana    Comment: told to refrain until after surgery  . Sexual activity: Yes    Birth control/protection: Surgical  Other Topics Concern  . None  Social History Narrative  . None   Additional History:   Lives with her husband.  Assessment:   Musculoskeletal: Strength & Muscle Tone: within normal limits Gait & Station: normal Patient leans:  N/A  Psychiatric Specialty Exam: Medication Refill  Pertinent negatives include no chills, neck pain or vomiting.  Depression         Associated symptoms include does not have insomnia.  Past medical history includes anxiety.   Drug Problem  Pertinent negatives include no hallucinations. Pertinent negatives include no vomiting.  Anxiety  Patient reports no insomnia or palpitations.      Review of Systems  Constitutional: Negative for chills and malaise/fatigue.  HENT: Negative for nosebleeds and tinnitus.   Eyes: Negative for photophobia.  Respiratory: Negative for hemoptysis.   Cardiovascular: Negative for palpitations.  Gastrointestinal: Negative for vomiting.  Genitourinary: Negative for urgency.  Musculoskeletal: Positive for back pain and joint pain. Negative for neck pain.  Skin: Negative for itching.  Neurological: Negative for tremors.  Endo/Heme/Allergies: Negative for environmental allergies.  Psychiatric/Behavioral: Positive for depression. Negative for hallucinations. The patient does not have insomnia.     Blood pressure 126/85, pulse 79, temperature 98.4 F (36.9 C), temperature source Oral, weight 202 lb 3.2 oz (91.7 kg).Body mass index is 34.71 kg/m.  General Appearance: Neat  Eye Contact:  Fair  Speech:  Normal Rate  Volume:  Normal  Mood:  Anxious  Affect:  Congruent  Thought Process:  Goal Directed  Orientation:  Full (Time, Place, and Person)  Thought Content:  WDL  Suicidal Thoughts:  No  Homicidal Thoughts:  No  Memory:  Immediate;   Fair  Judgement:  Fair  Insight:  Fair  Psychomotor Activity:  Normal  Concentration:  Fair  Recall:  [employer] of Knowledge: Fair  Language: Fair  Akathisia:  No  Handed:  Right  AIMS (if indicated):  none  Assets:  Communication Skills Desire for Improvement Social Support  ADL's:  Intact  Cognition: WNL  Sleep:  6-7    Is the patient at risk to self?  No. Has the patient been a risk to self in the  past 6 months?  No. Has the patient been a risk to self within the distant past?  No. Is the patient a risk to others?  No. Has the patient been a risk to others in the past 6 months?  No. Has the patient been a risk to others within the distant past?  No.  Current Medications: Current Outpatient Medications  Medication Sig Dispense Refill  . alendronate (FOSAMAX) 70 MG tablet Take 1 tablet (70 mg total) by mouth once  a week. Take with a full glass of water on an empty stomach. 12 tablet 3  . benztropine (COGENTIN) 0.5 MG tablet Take 1 tablet (0.5 mg total) by mouth daily. 90 tablet 1  . clonazePAM (KLONOPIN) 0.5 MG tablet Take 1 tablet (0.5 mg total) by mouth daily as needed for anxiety. 30 tablet 2  . esomeprazole (NEXIUM) 10 MG packet Take 10 mg by mouth daily as needed.    . fexofenadine (ALLEGRA) 180 MG tablet Take 180 mg daily by mouth.    . fluticasone (FLOVENT HFA) 220 MCG/ACT inhaler Inhale 2 puffs 2 (two) times daily into the lungs. 3 Inhaler 0  . hydrOXYzine (ATARAX/VISTARIL) 10 MG tablet Take 1 tablet (10 mg total) by mouth 2 (two) times daily. 180 tablet 1  . lubiprostone (AMITIZA) 24 MCG capsule Take 1 capsule (24 mcg total) by mouth 2 (two) times daily with a meal. 180 capsule 3  . meloxicam (MOBIC) 7.5 MG tablet TAKE 1 TABLET EVERY DAY 30 tablet 6  . rosuvastatin (CRESTOR) 10 MG tablet TAKE 1 TABLET EVERY DAY 90 tablet 0  . traZODone (DESYREL) 100 MG tablet Take 1 tablet (100 mg total) by mouth at bedtime. 90 tablet 1  . ziprasidone (GEODON) 20 MG capsule Take 1 capsule (20 mg total) by mouth 2 (two) times daily with a meal. 180 capsule 1  . ziprasidone (GEODON) 60 MG capsule Take 1 capsule (60 mg total) by mouth every evening. 90 capsule 1   No current facility-administered medications for this visit.     Medical Decision Making:  Established Problem, Stable/Improving (1), Review and summation of old records (2) and Review or order medicine tests (1)  Treatment Plan  Summary:Medication management   Discussed with patient about the medications and will not change any of her medications.   Continue  Geodon 60 mg po qhs  Continue Geodon 20 mg twice daily.-She has enough supply. Refilled Klonopin and trazodone. 3 month supply.  Vistaril 10 mg by mouth 2 times a day for anxiety-3 month supply  Follow-up in 3 month   Patient was given written side effects of the hydroxyzine and it did not show any weight gain or hair loss.   More than 50% of the time spent in psychoeducation, counseling and coordination of care.    This note was generated in part or whole with voice recognition software. Voice regonition is usually quite accurate but there are transcription errors that can and very often do occur. I apologize for any typographical errors that were not detected and corrected.    Rainey Pines, MD  01/03/2018, 9:14 AM

## 2018-01-13 ENCOUNTER — Other Ambulatory Visit: Payer: Self-pay

## 2018-01-13 ENCOUNTER — Ambulatory Visit
Admission: RE | Admit: 2018-01-13 | Discharge: 2018-01-13 | Disposition: A | Discharge status: Home / Self Care | Payer: Medicare PPO | Source: Ambulatory Visit | Attending: Urology | Admitting: Urology

## 2018-01-13 ENCOUNTER — Encounter: Payer: Self-pay | Admitting: Urology

## 2018-01-13 ENCOUNTER — Ambulatory Visit (INDEPENDENT_AMBULATORY_CARE_PROVIDER_SITE_OTHER): Payer: Medicare PPO | Admitting: Urology

## 2018-01-13 VITALS — BP 114/78 | HR 74 | Resp 16 | Ht 63.0 in | Wt 196.2 lb

## 2018-01-13 DIAGNOSIS — N200 Calculus of kidney: Secondary | ICD-10-CM

## 2018-01-13 DIAGNOSIS — K8020 Calculus of gallbladder without cholecystitis without obstruction: Secondary | ICD-10-CM | POA: Diagnosis not present

## 2018-01-13 LAB — URINALYSIS, COMPLETE
Bilirubin, UA: NEGATIVE
Glucose, UA: NEGATIVE
Leukocytes, UA: NEGATIVE
Nitrite, UA: NEGATIVE
PH UA: 5.5 (ref 5.0–7.5)
SPEC GRAV UA: 1.02 (ref 1.005–1.030)
Urobilinogen, Ur: 1 mg/dL (ref 0.2–1.0)

## 2018-01-13 LAB — MICROSCOPIC EXAMINATION: WBC, UA: NONE SEEN /hpf (ref 0–5)

## 2018-01-13 NOTE — Progress Notes (Signed)
01/13/2018 2:27 PM   Stacy Chambers 23-Jul-1966 [id number]  Referring provider: Martin Chambers, [place], [place] [address]  Chief complaint: Kidney stones  HPI:  Stacy Chambers is a 52 year old female seen at the request of Stacy Chambers for evaluation of nephrolithiasis.  She presents with a 3-day history of left pelvic pain situated below the waistline in the region of the left groin.  At its worse that she estimated the severity at 10/10.  It was precipitated and exacerbated by movement and bending.  Improved when laying still.  She had one episode of nausea and vomiting yesterday.  She denies fever, chills or voiding symptoms.  An abdominal ultrasound was performed that [facility] on 01/12/2018 which showed a 6 x 9 mm gallstone; a 12 x 15 mm right upper pole renal cyst and bilateral, nonobstructing 5 mm renal calculi.  She received parenteral Toradol with improvement in her pain. She denies previous history of stone disease or other urologic problems.   PMH: Past Medical History:  Diagnosis Date  . Anxiety   . Bipolar disorder ([place])   . 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 COPD (chronic obstructive pulmonary disease) (HCC)    no inhalers  . Elevated liver function tests 11/26/2004   H/O NEGATIVE LIVER BIOPSY, workup negative  . Hyperlipidemia   . Lower back pain   . Osteoarthritis   . Ovarian cancer ([place]) 1995  . Restless legs syndrome (RLS)   . RLS (restless legs syndrome)   . Wears dentures    full upper and lower    Surgical History: Past Surgical History:  Procedure Laterality Date  . ABDOMINAL HYSTERECTOMY    . APPENDECTOMY    . BREAST BIOPSY Left    neg- core  . CESAREAN SECTION    . EXCISION MORTON'S NEUROMA Left 10/07/2016   Procedure: EXCISION MORTON'S NEUROMA;  Surgeon: Stacy Chambers, DPM;  Location: [facility];  Service: Podiatry;  Laterality: Left;  IV WITH LOCAL  . LIVER BIOPSY  2005   PerPt: Done to eval  elevated LFTs and biopsy provided no definite dx/cause of elevated LFTs  . MULTIPLE TOOTH EXTRACTIONS    . OOPHORECTOMY      Home Medications:  Allergies as of 01/13/2018      Reactions   Benadryl [diphenhydramine Hcl]    Hives/ rapid heartrate   Vicodin [hydrocodone-acetaminophen] Other (See Comments)   dizziness      Medication List        Accurate as of 01/13/18  2:27 PM. Always use your most recent med list.          alendronate 70 MG tablet Commonly known as:  FOSAMAX Take 1 tablet (70 mg total) by mouth once a week. Take with a full glass of water on an empty stomach.   benztropine 0.5 MG tablet Commonly known as:  COGENTIN Take 1 tablet (0.5 mg total) by mouth 2 (two) times daily.   clonazePAM 0.5 MG tablet Commonly known as:  KLONOPIN Take 1 tablet (0.5 mg total) by mouth daily as needed for anxiety.   esomeprazole 10 MG packet Commonly known as:  NEXIUM Take 10 mg by mouth daily as needed.   fexofenadine 180 MG tablet Commonly known as:  ALLEGRA Take 180 mg daily by mouth.   fluticasone 220 MCG/ACT inhaler Commonly known as:  FLOVENT HFA Inhale 2 puffs 2 (two) times daily into the lungs.   hydrOXYzine 10 MG tablet Commonly known  as:  ATARAX/VISTARIL Take 1 tablet (10 mg total) by mouth 2 (two) times daily.   lubiprostone 24 MCG capsule Commonly known as:  AMITIZA Take 1 capsule (24 mcg total) by mouth 2 (two) times daily with a meal.   meloxicam 7.5 MG tablet Commonly known as:  MOBIC TAKE 1 TABLET EVERY DAY   rosuvastatin 10 MG tablet Commonly known as:  CRESTOR TAKE 1 TABLET EVERY DAY   traZODone 100 MG tablet Commonly known as:  DESYREL Take 1 tablet (100 mg total) by mouth at bedtime.   ziprasidone 60 MG capsule Commonly known as:  GEODON Take 1 capsule (60 mg total) by mouth every evening.   ziprasidone 20 MG capsule Commonly known as:  GEODON Take 1 capsule (20 mg total) by mouth 2 (two) times daily with a meal.        Allergies:  Allergies  Allergen Reactions  . Benadryl [Diphenhydramine Hcl]     Hives/ rapid heartrate  . Vicodin [Hydrocodone-Acetaminophen] Other (See Comments)    dizziness    Family History: Family History  Adopted: Yes  Problem Relation Age of Onset  . COPD Mother   . Depression Mother   . Colon cancer Mother        unsure of age  . Cancer Father        Lung CA, Skin Cancer--?type  . COPD Father   . Stroke Father   . Cancer Sister 27       cervical cancer  . COPD Brother   . Alcohol abuse Brother   . Stroke Brother   . COPD Maternal Aunt   . Depression Daughter   . Schizophrenia Son   . Breast cancer Neg Hx     Social History:  reports that she quit smoking about 8 years ago. Her smoking use included cigarettes. She has a 30.00 pack-year smoking history. She has never used smokeless tobacco. She reports that she drinks about 1.8 - 4.2 oz of alcohol per week. She reports that she has current or past drug history. Drug: Marijuana.  ROS: UROLOGY Frequent Urination?: No Hard to postpone urination?: No Burning/pain with urination?: No Get up at night to urinate?: No Leakage of urine?: No Urine stream starts and stops?: No Trouble starting stream?: No Do you have to strain to urinate?: No Blood in urine?: No Urinary tract infection?: No Sexually transmitted disease?: No Injury to kidneys or bladder?: No Painful intercourse?: No Weak stream?: No Currently pregnant?: No Vaginal bleeding?: No  Gastrointestinal Nausea?: No Vomiting?: No Indigestion/heartburn?: No Diarrhea?: No Constipation?: No  Constitutional Fever: No Night sweats?: No Weight loss?: No Fatigue?: No  Skin Skin rash/lesions?: No Itching?: No  Eyes Blurred vision?: No Double vision?: No  Ears/Nose/Throat Sore throat?: No Sinus problems?: No  Hematologic/Lymphatic Swollen glands?: No Easy bruising?: No  Cardiovascular Leg swelling?: No Chest pain?:  No  Respiratory Cough?: No Shortness of breath?: No  Endocrine Excessive thirst?: No  Musculoskeletal Back pain?: No Joint pain?: No  Neurological Headaches?: No Dizziness?: No  Psychologic Depression?: No Anxiety?: No  Physical Exam: There were no vitals taken for this visit.  Constitutional:  Alert and oriented, No acute distress. HEENT: [place] AT, moist mucus membranes.  Trachea midline, no masses. Cardiovascular: No clubbing, cyanosis, or edema. Respiratory: Normal respiratory effort, no increased work of breathing. GI: Abdomen is soft, nontender, nondistended, no abdominal masses.  There is tenderness present in the left groin region just above the inguinal crease. GU: No CVA tenderness Lymph:  No cervical or inguinal lymphadenopathy. Skin: No rashes, bruises or suspicious lesions. Neurologic: Grossly intact, no focal deficits, moving all 4 extremities. Psychiatric: Normal mood and affect.  Laboratory Data: Lab Results  Component Value Date   WBC 6.7 09/13/2017   HGB 11.8 (L) 09/13/2017   HCT 34.3 (L) 09/13/2017   MCV 87.2 09/13/2017   PLT 229 09/13/2017    Lab Results  Component Value Date   CREATININE 0.78 03/04/2015   Urinalysis 2+ blood on dipstick however there is no significant hematuria on microscopy.  Pertinent Imaging: She brought a CD of her ultrasound however I was unable to open it.  Assessment & Plan:    1. Nephrolithiasis She has nonobstructing renal calculi which would not be a source of her left pelvic/groin pain.  It is worse with movement and fairly tender superficially and this may represent a musculoskeletal etiology.  I recommended a KUB.  If there are no obvious ureteral calculi will order a stone protocol CT of the abdomen and pelvis.  - Abdomen 1 view (KUB); Future   Abbie Sons, [place] [address], [place],  [address] [phone]

## 2018-01-16 ENCOUNTER — Other Ambulatory Visit: Payer: Self-pay | Admitting: Urology

## 2018-01-16 DIAGNOSIS — R1030 Lower abdominal pain, unspecified: Secondary | ICD-10-CM

## 2018-01-17 ENCOUNTER — Telehealth: Payer: Self-pay

## 2018-01-17 NOTE — Telephone Encounter (Signed)
-----   Message from Abbie Sons, MD sent at 01/16/2018 10:50 AM EDT ----- KUB showed no evidence of a ureteral  calculus.  Recommend scheduling CT of the abdomen and pelvis.  Order was entered.  Will call with results.

## 2018-01-17 NOTE — Telephone Encounter (Signed)
Spoke with pt in reference to KUB and CT. Pt voiced understanding.

## 2018-01-24 ENCOUNTER — Ambulatory Visit: Admission: RE | Admit: 2018-01-24 | Payer: Medicare PPO | Source: Ambulatory Visit

## 2018-02-07 ENCOUNTER — Encounter: Payer: Self-pay | Admitting: Surgery

## 2018-02-07 ENCOUNTER — Ambulatory Visit: Payer: Medicare PPO | Admitting: Surgery

## 2018-02-07 VITALS — BP 145/93 | HR 80 | Temp 98.1°F | Ht 63.0 in | Wt 198.0 lb

## 2018-02-07 DIAGNOSIS — R1032 Left lower quadrant pain: Secondary | ICD-10-CM | POA: Diagnosis not present

## 2018-02-07 NOTE — Patient Instructions (Signed)
Please go to your gastroenterologist visit. Once you have your Colonoscopy done, please give [name] a call so you could come and see Dr. Burt Knack to discuss possible surgery if needed to.

## 2018-02-07 NOTE — Progress Notes (Signed)
Stacy Chambers is an 52 y.o. female.   Chief Complaint: Gallstones, cramping  Consult requested by ER physician and [facility]  HPI: This patient with gallstones. Cramping pain. Hx of nephrolithiasis. U/s done at [facility] shows solitary gallstones. Has IBS. Adopted, no FH known.  Her chief complaint is a known gallstones on recent ultrasound her true chief complaint is left lower quadrant pain.  She is frequently constipated and takes a medication for IBS to help her have bowel movements.  When she does not take that medicine it is worse.  Never has right upper quadrant pain has had no jaundice or acholic stools.  She had a upper and lower GI series performed in 2004 and is due to see her GI physician and has an appointment in May.  Past Medical History:  Diagnosis Date  . Abnormal blood chemistry 10/03/2015  . Anxiety   . Anxiety, generalized 02/21/2015  . Bipolar affective disorder ([place]) 03/08/2013   Overview:  Last Assessment & Plan:  Continue meds, f/u with psych Restart benzo at 1 po BID prn,    . Bipolar disorder ([place])   . 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 Chronic obstructive pulmonary disease ([place]) 10/03/2015  . COPD (chronic obstructive pulmonary disease) (HCC)    no inhalers  . Elevated liver function tests 11/26/2004   H/O NEGATIVE LIVER BIOPSY, workup negative  . Gallstones 02/21/2015  . Hyperlipidemia   . Lower back pain   . Osteoarthritis   . Ovarian cancer ([place]) 1995  . Restless legs syndrome (RLS)   . RLS (restless legs syndrome)   . Wears dentures    full upper and lower    Past Surgical History:  Procedure Laterality Date  . ABDOMINAL HYSTERECTOMY    . APPENDECTOMY    . BREAST BIOPSY Left    neg- core  . CESAREAN SECTION    . EXCISION MORTON'S NEUROMA Left 10/07/2016   Procedure: EXCISION MORTON'S NEUROMA;  Surgeon: [name], DPM;  Location: [facility];  Service: Podiatry;  Laterality: Left;  IV WITH LOCAL  . LIVER BIOPSY  2005   PerPt: Done  to eval elevated LFTs and biopsy provided no definite dx/cause of elevated LFTs  . MULTIPLE TOOTH EXTRACTIONS    . OOPHORECTOMY      Family History  Adopted: Yes  Problem Relation Age of Onset  . COPD Mother   . Depression Mother   . Colon cancer Mother        unsure of age  . Cancer Father        Lung CA, Skin Cancer--?type  . COPD Father   . Stroke Father   . Cancer Sister 80       cervical cancer  . COPD Brother   . Alcohol abuse Brother   . Stroke Brother   . COPD Maternal Aunt   . Depression Daughter   . Schizophrenia Son   . Breast cancer Neg Hx    Social History:  reports that she quit smoking about 8 years ago. Her smoking use included cigarettes. She has a 30.00 pack-year smoking history. She has never used smokeless tobacco. She reports that she drinks about 1.8 - 4.2 oz of alcohol per week. She reports that she has current or past drug history. Drug: Marijuana.  Allergies:  Allergies  Allergen Reactions  . Benadryl [Diphenhydramine Hcl]     Hives/ rapid heartrate  . Vicodin [Hydrocodone-Acetaminophen] Other (See Comments)    dizziness     (  Not in a hospital admission)   Review of Systems:   Review of Systems  Constitutional: Negative.   HENT: Negative.   Eyes: Negative.   Respiratory: Negative.   Cardiovascular: Negative.   Gastrointestinal: Positive for abdominal pain and constipation. Negative for blood in stool, diarrhea, heartburn, melena, nausea and vomiting.  Genitourinary: Negative.   Musculoskeletal: Negative.   Skin: Negative.   Neurological: Negative.   Endo/Heme/Allergies: Negative.   Psychiatric/Behavioral: Negative.     Physical Exam:  Physical Exam  Constitutional: She is oriented to person, place, and time. She appears well-developed and well-nourished.  HENT:  Head: Normocephalic and atraumatic.  Eyes: Pupils are equal, round, and reactive to light. Right eye exhibits no discharge. Left eye exhibits no discharge. No scleral  icterus.  Neck: Normal range of motion.  Cardiovascular: Normal rate, regular rhythm and normal heart sounds.  Pulmonary/Chest: Effort normal and breath sounds normal. No stridor. No respiratory distress. She has no wheezes.  Abdominal: Soft. She exhibits no distension and no mass. There is no tenderness. There is no rebound and no guarding.  Minimal left lower quadrant tenderness without peritoneal signs.  Negative right upper quadrant tenderness negative Murphy sign  Musculoskeletal: Normal range of motion. She exhibits no edema.  Lymphadenopathy:    She has no cervical adenopathy.  Neurological: She is alert and oriented to person, place, and time.  Skin: Skin is warm and dry.  Vitals reviewed.   There were no vitals taken for this visit.    No results found for this or any previous visit (from the past 48 hour(s)). No results found.   Assessment/Plan An [facility] showing a single gallstone with no sign of acute cholecystitis.  No liver function tests are available.  He has had prior LFT elevations and in fact had a liver biopsy at some point.  Her chief complaint and major portion of her history and physical are suggestive of left lower quadrant pain and tenderness which is not consistent with gallbladder disease.  She has an appointment with a GI physician but she cannot remember the name.  I would like for her to have a colonoscopy as her symptoms really point towards this being diverticulosis or some other colon process as opposed to gallbladder disease.  We will follow-up after that colonoscopy.  Florene Glen, MD, FACS

## 2018-02-16 ENCOUNTER — Ambulatory Visit: Payer: Medicare PPO | Admitting: Surgery

## 2018-04-04 ENCOUNTER — Other Ambulatory Visit: Payer: Self-pay

## 2018-04-04 ENCOUNTER — Encounter: Payer: Self-pay | Admitting: Psychiatry

## 2018-04-04 ENCOUNTER — Ambulatory Visit: Payer: Medicare PPO | Admitting: Psychiatry

## 2018-04-04 VITALS — BP 116/62 | HR 81 | Temp 98.7°F | Wt 195.4 lb

## 2018-04-04 DIAGNOSIS — F5101 Primary insomnia: Secondary | ICD-10-CM | POA: Diagnosis not present

## 2018-04-04 DIAGNOSIS — F3160 Bipolar disorder, current episode mixed, unspecified: Secondary | ICD-10-CM

## 2018-04-04 MED ORDER — TRAZODONE HCL 100 MG PO TABS: 100.0000 mg | ORAL_TABLET | Freq: Every day | ORAL | 1 refills | Status: DC

## 2018-04-04 MED ORDER — HYDROXYZINE HCL 25 MG PO TABS: 25.0000 mg | ORAL_TABLET | Freq: Two times a day (BID) | ORAL | 1 refills | Status: DC

## 2018-04-04 MED ORDER — ZIPRASIDONE HCL 60 MG PO CAPS: 60.0000 mg | ORAL_CAPSULE | Freq: Every evening | ORAL | 1 refills | Status: DC

## 2018-04-04 MED ORDER — ZIPRASIDONE HCL 20 MG PO CAPS: 20.0000 mg | ORAL_CAPSULE | Freq: Two times a day (BID) | ORAL | 1 refills | Status: DC

## 2018-04-04 MED ORDER — CLONAZEPAM 0.5 MG PO TABS: 0.5000 mg | ORAL_TABLET | Freq: Every day | ORAL | 2 refills | Status: DC | PRN

## 2018-04-04 MED ORDER — BENZTROPINE MESYLATE 0.5 MG PO TABS: 0.5000 mg | ORAL_TABLET | Freq: Two times a day (BID) | ORAL | 1 refills | Status: DC

## 2018-04-04 NOTE — Progress Notes (Signed)
BH MD/PA/NP OP Progress Note  04/04/2018 9:48 AM Stacy Chambers  MRN:  [id number]  Subjective:    Patient is a 52 year old female with history of bipolar disorder who presented for the follow-up appointment. She said that she has been taking her medications as prescribed.  Patient reported that she has some anxiety but the Vistaril has been helpful.  She takes this as prescribed and is interested in going higher on the dose of the medication.  We discussed about her medications in detail.  Patient reported that she continues to take her medications and her husband reported that she is somewhat anxious but she feels that she has been getting help with the help of the Geodon.  She does not want to change her medications as she is already on 100 mg at this time.  Patient currently denied having any side effects of the medication.  She denied having any perceptual disturbances.  She denied having any suicidal homicidal ideations or plans.  She reported that she enjoys when she has been riding her motorcycle on a regular basis.  She is looking forward to the summer as it has been raining  these days.  She appears pleasant and cooperative during the interview.    She sleeps well at night.  Her appetite is good.  No weight changes at this time.  She continues to take her medications as prescribed.  We will adjust the dose of hydroxyzine during this interview.              Chief Complaint:  Chief Complaint    Anxiety; Follow-up     Visit Diagnosis:     ICD-10-CM   1. Bipolar I disorder, most recent episode mixed ([place]) F31.60   2. Primary insomnia F51.01     Past Medical History:  Past Medical History:  Diagnosis Date  . Abnormal blood chemistry 10/03/2015  . Anxiety   . Anxiety, generalized 02/21/2015  . Bipolar affective disorder ([place]) 03/08/2013   Overview:  Last Assessment & Plan:  Continue meds, f/u with psych Restart benzo at 1 po BID prn,    . Bipolar disorder ([place])   . 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 Chronic obstructive pulmonary disease ([place]) 10/03/2015  . COPD (chronic obstructive pulmonary disease) ([facility])    no inhalers  . Elevated liver function tests 11/26/2004   H/O NEGATIVE LIVER BIOPSY, workup negative  . Gallstones 02/21/2015  . Hyperlipidemia   . Lower back pain   . Osteoarthritis   . Ovarian cancer ([place]) 1995  . Restless legs syndrome (RLS)   . RLS (restless legs syndrome)   . Wears dentures    full upper and lower    Past Surgical History:  Procedure Laterality Date  . ABDOMINAL HYSTERECTOMY    . APPENDECTOMY    . BREAST BIOPSY Left    neg- core  . CESAREAN SECTION    . EXCISION MORTON'S NEUROMA Left 10/07/2016   Procedure: EXCISION MORTON'S NEUROMA;  Surgeon: [name], DPM;  Location: [facility];  Service: Podiatry;  Laterality: Left;  IV WITH LOCAL  . LIVER BIOPSY  2005   PerPt: Done to eval elevated LFTs and biopsy provided no definite dx/cause of elevated LFTs  . MULTIPLE TOOTH EXTRACTIONS    . OOPHORECTOMY     Family History:  Family History  Adopted: Yes  Problem Relation Age of Onset  . COPD Mother   . Depression Mother   . Colon cancer Mother  unsure of age  . Cancer Father        Lung CA, Skin Cancer--?type  . COPD Father   . Stroke Father   . Cancer Sister 71       cervical cancer  . COPD Brother   . Alcohol abuse Brother   . Stroke Brother   . COPD Maternal Aunt   . Depression Daughter   . Schizophrenia Son   . Breast cancer Neg Hx    Social History:  Social History   Socioeconomic History  . Marital status: Married    Spouse name: Not on file  . Number of children: Not on file  . Years of education: Not on file  . Highest education level: Not on file  Occupational History  . Not on file  Social Needs  . Financial resource strain: Not on file  . Food insecurity:    Worry: Not on file    Inability: Not on file  . Transportation needs:    Medical: Not on file    Non-medical: Not on  file  Tobacco Use  . Smoking status: Former Smoker    Packs/day: 1.00    Years: 30.00    Pack years: 30.00    Types: Cigarettes    Last attempt to quit: 04/08/2009    Years since quitting: 8.9  . Smokeless tobacco: Never Used  Substance and Sexual Activity  . Alcohol use: Yes    Alcohol/week: 1.8 - 4.2 oz    Types: 3 - 7 Shots of liquor per week    Comment: occasionally  . Drug use: Yes    Types: Marijuana    Comment: told to refrain until after surgery  . Sexual activity: Yes    Birth control/protection: Surgical  Lifestyle  . Physical activity:    Days per week: Not on file    Minutes per session: Not on file  . Stress: Not on file  Relationships  . Social connections:    Talks on phone: Not on file    Gets together: Not on file    Attends religious service: Not on file    Active member of club or organization: Not on file    Attends meetings of clubs or organizations: Not on file    Relationship status: Not on file  Other Topics Concern  . Not on file  Social History Narrative  . Not on file   Additional History:   Lives with her husband.  Assessment:   Musculoskeletal: Strength & Muscle Tone: within normal limits Gait & Station: normal Patient leans: N/A  Psychiatric Specialty Exam: Medication Refill  Pertinent negatives include no chills, neck pain or vomiting.  Depression         Associated symptoms include does not have insomnia.  Past medical history includes anxiety.   Drug Problem  Pertinent negatives include no hallucinations. Pertinent negatives include no vomiting.  Anxiety  Patient reports no insomnia or palpitations.      Review of Systems  Constitutional: Negative for chills and malaise/fatigue.  HENT: Negative for nosebleeds and tinnitus.   Eyes: Negative for photophobia.  Respiratory: Negative for hemoptysis.   Cardiovascular: Negative for palpitations.  Gastrointestinal: Negative for vomiting.  Genitourinary: Negative for urgency.   Musculoskeletal: Positive for back pain and joint pain. Negative for neck pain.  Skin: Negative for itching.  Neurological: Negative for tremors.  Endo/Heme/Allergies: Negative for environmental allergies.  Psychiatric/Behavioral: Positive for depression. Negative for hallucinations. The patient does not have insomnia.  Blood pressure 116/62, pulse 81, temperature 98.7 F (37.1 C), temperature source Oral, weight 195 lb 6.4 oz (88.6 kg).Body mass index is 34.61 kg/m.  General Appearance: Neat  Eye Contact:  Fair  Speech:  Normal Rate  Volume:  Normal  Mood:  Anxious  Affect:  Congruent  Thought Process:  Goal Directed  Orientation:  Full (Time, Place, and Person)  Thought Content:  WDL  Suicidal Thoughts:  No  Homicidal Thoughts:  No  Memory:  Immediate;   Fair  Judgement:  Fair  Insight:  Fair  Psychomotor Activity:  Normal  Concentration:  Fair  Recall:  [employer] of Knowledge: Fair  Language: Fair  Akathisia:  No  Handed:  Right  AIMS (if indicated):  none  Assets:  Communication Skills Desire for Improvement Social Support  ADL's:  Intact  Cognition: WNL  Sleep:  6-7    Is the patient at risk to self?  No. Has the patient been a risk to self in the past 6 months?  No. Has the patient been a risk to self within the distant past?  No. Is the patient a risk to others?  No. Has the patient been a risk to others in the past 6 months?  No. Has the patient been a risk to others within the distant past?  No.  Current Medications: Current Outpatient Medications  Medication Sig Dispense Refill  . alendronate (FOSAMAX) 70 MG tablet Take 1 tablet (70 mg total) by mouth once a week. Take with a full glass of water on an empty stomach. 12 tablet 3  . benztropine (COGENTIN) 0.5 MG tablet Take 1 tablet (0.5 mg total) by mouth 2 (two) times daily. 180 tablet 1  . clonazePAM (KLONOPIN) 0.5 MG tablet Take 1 tablet (0.5 mg total) by mouth daily as needed for anxiety. 30 tablet  2  . esomeprazole (NEXIUM) 10 MG packet Take 10 mg by mouth daily as needed.    . fexofenadine (ALLEGRA) 180 MG tablet Take 180 mg daily by mouth.    . fluticasone (FLOVENT HFA) 220 MCG/ACT inhaler Inhale 2 puffs 2 (two) times daily into the lungs. 3 Inhaler 0  . hydrOXYzine (ATARAX/VISTARIL) 25 MG tablet Take 1 tablet (25 mg total) by mouth 2 (two) times daily. 180 tablet 1  . lubiprostone (AMITIZA) 24 MCG capsule Take 1 capsule (24 mcg total) by mouth 2 (two) times daily with a meal. 180 capsule 3  . meloxicam (MOBIC) 7.5 MG tablet TAKE 1 TABLET EVERY DAY 30 tablet 6  . rosuvastatin (CRESTOR) 10 MG tablet TAKE 1 TABLET EVERY DAY 90 tablet 0  . tamsulosin (FLOMAX) 0.4 MG CAPS capsule Take 0.4 mg by mouth daily.  0  . traZODone (DESYREL) 100 MG tablet Take 1 tablet (100 mg total) by mouth at bedtime. 90 tablet 1  . ziprasidone (GEODON) 20 MG capsule Take 1 capsule (20 mg total) by mouth 2 (two) times daily with a meal. 180 capsule 1  . ziprasidone (GEODON) 60 MG capsule Take 1 capsule (60 mg total) by mouth every evening. 90 capsule 1   No current facility-administered medications for this visit.     Medical Decision Making:  Established Problem, Stable/Improving (1), Review and summation of old records (2) and Review or order medicine tests (1)  Treatment Plan Summary:Medication management   Discussed with patient about the medications and will not change any of her medications.   Continue  Geodon 60 mg po qhs  Continue Geodon 20 mg  twice daily.-She has enough supply. Refilled Klonopin and trazodone. 3 month supply.  Vistaril 10 mg by mouth 2 times a day for anxiety-3 month supply  Follow-up in 3 month   Patient was given written side effects of the hydroxyzine and it did not show any weight gain or hair loss.   More than 50% of the time spent in psychoeducation, counseling and coordination of care.    This note was generated in part or whole with voice recognition software.  Voice regonition is usually quite accurate but there are transcription errors that can and very often do occur. I apologize for any typographical errors that were not detected and corrected.    Rainey Pines, MD  04/04/2018, 9:48 AM

## 2018-04-06 ENCOUNTER — Telehealth: Payer: Self-pay

## 2018-04-06 NOTE — Telephone Encounter (Signed)
faxed and confirmed rx for klonopin .5mg  id # [id number] order # [id number]

## 2018-04-08 ENCOUNTER — Ambulatory Visit
Admission: RE | Admit: 2018-04-08 | Discharge: 2018-04-08 | Disposition: A | Discharge status: Home / Self Care | Payer: Medicare PPO | Source: Ambulatory Visit | Attending: Urology | Admitting: Urology

## 2018-04-08 DIAGNOSIS — K439 Ventral hernia without obstruction or gangrene: Secondary | ICD-10-CM | POA: Diagnosis not present

## 2018-04-08 DIAGNOSIS — R1030 Lower abdominal pain, unspecified: Secondary | ICD-10-CM

## 2018-04-08 DIAGNOSIS — K8020 Calculus of gallbladder without cholecystitis without obstruction: Secondary | ICD-10-CM | POA: Insufficient documentation

## 2018-04-08 DIAGNOSIS — K5730 Diverticulosis of large intestine without perforation or abscess without bleeding: Secondary | ICD-10-CM | POA: Insufficient documentation

## 2018-04-08 DIAGNOSIS — I700 Atherosclerosis of aorta: Secondary | ICD-10-CM | POA: Insufficient documentation

## 2018-04-08 DIAGNOSIS — K760 Fatty (change of) liver, not elsewhere classified: Secondary | ICD-10-CM | POA: Insufficient documentation

## 2018-04-12 ENCOUNTER — Telehealth: Payer: Self-pay

## 2018-04-12 NOTE — Telephone Encounter (Signed)
Called pt informed her of the information below. Pt gave verbal understanding.  

## 2018-04-12 NOTE — Telephone Encounter (Signed)
-----   Message from Abbie Sons, MD sent at 04/10/2018 11:31 AM EDT ----- CT scan shows no renal or ureteral calculi.  The findings seen on renal ultrasound would not represent calculi as the CT scan is more specific.  Would recommend follow-up with her PCP for further evaluation of her pain as it does not appear to be urologic in origin.

## 2018-04-27 ENCOUNTER — Encounter: Payer: Self-pay | Admitting: Physician Assistant

## 2018-04-27 ENCOUNTER — Ambulatory Visit (INDEPENDENT_AMBULATORY_CARE_PROVIDER_SITE_OTHER): Payer: Medicare PPO | Admitting: Physician Assistant

## 2018-04-27 ENCOUNTER — Other Ambulatory Visit: Payer: Self-pay

## 2018-04-27 VITALS — BP 114/72 | HR 67 | Temp 97.8°F | Resp 14 | Ht 64.0 in | Wt 194.0 lb

## 2018-04-27 DIAGNOSIS — M7662 Achilles tendinitis, left leg: Secondary | ICD-10-CM | POA: Diagnosis not present

## 2018-04-27 NOTE — Progress Notes (Signed)
Patient ID: Stacy Chambers MRN: [id number], DOB: 25-Jan-1966, 52 y.o. Date of Encounter: 04/27/2018, 9:04 AM    Chief Complaint:  Chief Complaint  Patient presents with  . left ankle pain    hurts to walk 2-3 weeks     HPI: 52 y.o. year old female presents with above.   She points to back of ankle just above the posterior heel on both sides and says that along both sides of that area have been sore over the last 2 to 3 weeks. Says that she notices the discomfort they are anytime that she is flexing extending at the ankle such as even walking.  Says that that area feels really sore when she first wakes up in the morning or when she first stands up after she has been sitting a long time. States that she has had no injury to the area.  States that she has done no new or different activity recently.     Home Meds:   Outpatient Medications Prior to Visit  Medication Sig Dispense Refill  . alendronate (FOSAMAX) 70 MG tablet Take 1 tablet (70 mg total) by mouth once a week. Take with a full glass of water on an empty stomach. 12 tablet 3  . benztropine (COGENTIN) 0.5 MG tablet Take 1 tablet (0.5 mg total) by mouth 2 (two) times daily. 180 tablet 1  . clonazePAM (KLONOPIN) 0.5 MG tablet Take 1 tablet (0.5 mg total) by mouth daily as needed for anxiety. 30 tablet 2  . esomeprazole (NEXIUM) 10 MG packet Take 10 mg by mouth daily as needed.    . fluticasone (FLOVENT HFA) 220 MCG/ACT inhaler Inhale 2 puffs 2 (two) times daily into the lungs. 3 Inhaler 0  . hydrOXYzine (ATARAX/VISTARIL) 25 MG tablet Take 1 tablet (25 mg total) by mouth 2 (two) times daily. 180 tablet 1  . lubiprostone (AMITIZA) 24 MCG capsule Take 1 capsule (24 mcg total) by mouth 2 (two) times daily with a meal. (Patient taking differently: Take 24 mcg by mouth 2 (two) times daily with a meal. As needed) 180 capsule 3  . meloxicam (MOBIC) 7.5 MG tablet TAKE 1 TABLET EVERY DAY 30 tablet 6  . rosuvastatin (CRESTOR) 10 MG tablet  TAKE 1 TABLET EVERY DAY 90 tablet 0  . tamsulosin (FLOMAX) 0.4 MG CAPS capsule Take 0.4 mg by mouth daily.  0  . traZODone (DESYREL) 100 MG tablet Take 1 tablet (100 mg total) by mouth at bedtime. 90 tablet 1  . ziprasidone (GEODON) 20 MG capsule Take 1 capsule (20 mg total) by mouth 2 (two) times daily with a meal. 180 capsule 1  . ziprasidone (GEODON) 60 MG capsule Take 1 capsule (60 mg total) by mouth every evening. 90 capsule 1  . fexofenadine (ALLEGRA) 180 MG tablet Take 180 mg daily by mouth.     No facility-administered medications prior to visit.     Allergies:  Allergies  Allergen Reactions  . Benadryl [Diphenhydramine Hcl]     Hives/ rapid heartrate  . Vicodin [Hydrocodone-Acetaminophen] Other (See Comments)    dizziness      Review of Systems: See HPI for pertinent ROS. All other ROS negative.    Physical Exam: Blood pressure 114/72, pulse 67, temperature 97.8 F (36.6 C), temperature source Oral, resp. rate 14, height 5\' 4"  (1.626 m), weight 88 kg (194 lb), SpO2 96 %., Body mass index is 33.3 kg/m. General:  WF. Appears in no acute distress. Neck: Supple. No thyromegaly. No  lymphadenopathy. Lungs: Clear bilaterally to auscultation without wheezes, rales, or rhonchi. Breathing is unlabored. Heart: Regular rhythm. No murmurs, rubs, or gallops. Msk:  Strength and tone normal for age. Inspection of the area of the Achilles tendon appears normal.  There is no visible swelling or erythema or warmth.  Flexion and extension and range of motion in the area are intact.  When I have her point her toes and extend with her toes she says that she feels it more with that versus with dorsiflexion of the ankle. Had her extend the left heel off of the edge of the step to do a calf stretch.  States that she can feel stretching in her calf. Extremities/Skin: Warm and dry.  Neuro: Alert and oriented X 3. Moves all extremities spontaneously. Gait is normal. CNII-XII grossly in tact. Psych:   Responds to questions appropriately with a normal affect.     ASSESSMENT AND PLAN:  52 y.o. year old female with  1. Left Achilles tendinitis She is wearing sandals that are completely flat with absolutely no arch support today.  Discussed with her that she needs to avoid wearing these type of his shoes or going barefoot.  Needs to wear proper arch support as much as possible. Discussed resting the area.  Applying ice using back vegetables frozen vegetables to the area if needed.  She is to do the calf stretch that we demonstrated during the visit routinely.  Needs to stretch the calf and strengthen the calf.  Follow-up if needed.   Signed, [address] [place], [place], [facility] 04/27/2018 9:04 AM

## 2018-05-06 ENCOUNTER — Telehealth: Payer: Self-pay

## 2018-05-06 NOTE — Telephone Encounter (Signed)
Patient called on 7/11 and left a message indicating her achilles was still bothering her and that it was time for her to have a mammogram done.  Call placed to patient and I explained to her that she is to get a mammogram every 2 years and to continue to apply ice to her achilles as well as wear arch support shoes. Patient states she will give it another week then follow up with PCP if symptoms still have not improved.

## 2018-05-30 ENCOUNTER — Ambulatory Visit: Payer: Medicare PPO | Admitting: Anesthesiology

## 2018-05-30 ENCOUNTER — Ambulatory Visit
Admission: RE | Admit: 2018-05-30 | Discharge: 2018-05-30 | Disposition: A | Discharge status: Home / Self Care | Payer: Medicare PPO | Source: Ambulatory Visit | Attending: Unknown Physician Specialty | Admitting: Unknown Physician Specialty

## 2018-05-30 ENCOUNTER — Encounter
Admission: RE | Disposition: A | Discharge status: Home / Self Care | Payer: Self-pay | Source: Ambulatory Visit | Attending: Unknown Physician Specialty

## 2018-05-30 ENCOUNTER — Encounter: Payer: Self-pay | Admitting: *Deleted

## 2018-05-30 DIAGNOSIS — K219 Gastro-esophageal reflux disease without esophagitis: Secondary | ICD-10-CM | POA: Insufficient documentation

## 2018-05-30 DIAGNOSIS — M1990 Unspecified osteoarthritis, unspecified site: Secondary | ICD-10-CM | POA: Insufficient documentation

## 2018-05-30 DIAGNOSIS — Z885 Allergy status to narcotic agent status: Secondary | ICD-10-CM | POA: Insufficient documentation

## 2018-05-30 DIAGNOSIS — Z8049 Family history of malignant neoplasm of other genital organs: Secondary | ICD-10-CM | POA: Diagnosis not present

## 2018-05-30 DIAGNOSIS — F411 Generalized anxiety disorder: Secondary | ICD-10-CM | POA: Insufficient documentation

## 2018-05-30 DIAGNOSIS — K640 First degree hemorrhoids: Secondary | ICD-10-CM | POA: Insufficient documentation

## 2018-05-30 DIAGNOSIS — Z79899 Other long term (current) drug therapy: Secondary | ICD-10-CM | POA: Diagnosis not present

## 2018-05-30 DIAGNOSIS — Z888 Allergy status to other drugs, medicaments and biological substances status: Secondary | ICD-10-CM | POA: Diagnosis not present

## 2018-05-30 DIAGNOSIS — Z8542 Personal history of malignant neoplasm of other parts of uterus: Secondary | ICD-10-CM | POA: Insufficient documentation

## 2018-05-30 DIAGNOSIS — E785 Hyperlipidemia, unspecified: Secondary | ICD-10-CM | POA: Diagnosis not present

## 2018-05-30 DIAGNOSIS — G2581 Restless legs syndrome: Secondary | ICD-10-CM | POA: Diagnosis not present

## 2018-05-30 DIAGNOSIS — J449 Chronic obstructive pulmonary disease, unspecified: Secondary | ICD-10-CM | POA: Diagnosis not present

## 2018-05-30 DIAGNOSIS — Z8543 Personal history of malignant neoplasm of ovary: Secondary | ICD-10-CM | POA: Diagnosis not present

## 2018-05-30 DIAGNOSIS — Z1211 Encounter for screening for malignant neoplasm of colon: Secondary | ICD-10-CM | POA: Insufficient documentation

## 2018-05-30 DIAGNOSIS — Z818 Family history of other mental and behavioral disorders: Secondary | ICD-10-CM | POA: Insufficient documentation

## 2018-05-30 DIAGNOSIS — Z800 Family history of malignant neoplasm of digestive organs: Secondary | ICD-10-CM | POA: Insufficient documentation

## 2018-05-30 DIAGNOSIS — Z801 Family history of malignant neoplasm of trachea, bronchus and lung: Secondary | ICD-10-CM | POA: Insufficient documentation

## 2018-05-30 DIAGNOSIS — F3189 Other bipolar disorder: Secondary | ICD-10-CM | POA: Insufficient documentation

## 2018-05-30 DIAGNOSIS — Z825 Family history of asthma and other chronic lower respiratory diseases: Secondary | ICD-10-CM | POA: Diagnosis not present

## 2018-05-30 DIAGNOSIS — M545 Low back pain: Secondary | ICD-10-CM | POA: Insufficient documentation

## 2018-05-30 DIAGNOSIS — K621 Rectal polyp: Secondary | ICD-10-CM | POA: Diagnosis not present

## 2018-05-30 DIAGNOSIS — Z811 Family history of alcohol abuse and dependence: Secondary | ICD-10-CM | POA: Diagnosis not present

## 2018-05-30 DIAGNOSIS — Z823 Family history of stroke: Secondary | ICD-10-CM | POA: Insufficient documentation

## 2018-05-30 DIAGNOSIS — Z87891 Personal history of nicotine dependence: Secondary | ICD-10-CM | POA: Insufficient documentation

## 2018-05-30 DIAGNOSIS — G4700 Insomnia, unspecified: Secondary | ICD-10-CM | POA: Diagnosis not present

## 2018-05-30 DIAGNOSIS — Z90710 Acquired absence of both cervix and uterus: Secondary | ICD-10-CM | POA: Diagnosis not present

## 2018-05-30 HISTORY — DX: Sedative, hypnotic or anxiolytic dependence with withdrawal, unspecified: F13.239

## 2018-05-30 HISTORY — PX: COLONOSCOPY WITH PROPOFOL: SHX5780

## 2018-05-30 HISTORY — DX: Gastro-esophageal reflux disease without esophagitis: K21.9

## 2018-05-30 HISTORY — DX: Angina pectoris, unspecified: I20.9

## 2018-05-30 HISTORY — DX: Insomnia, unspecified: G47.00

## 2018-05-30 SURGERY — COLONOSCOPY WITH PROPOFOL
Anesthesia: General

## 2018-05-30 MED ORDER — SODIUM CHLORIDE 0.9 % IV SOLN
INTRAVENOUS | Status: DC
  Administered 2018-05-30: 13:00:00 via INTRAVENOUS

## 2018-05-30 MED ORDER — FENTANYL CITRATE (PF) 100 MCG/2ML IJ SOLN
INTRAMUSCULAR | Status: AC
  Filled 2018-05-30: qty 2

## 2018-05-30 MED ORDER — PROPOFOL 500 MG/50ML IV EMUL
INTRAVENOUS | Status: AC
Start: 2018-05-30 — End: ?
  Filled 2018-05-30: qty 50

## 2018-05-30 MED ORDER — FENTANYL CITRATE (PF) 100 MCG/2ML IJ SOLN
INTRAMUSCULAR | Status: DC | PRN
  Administered 2018-05-30 (×2): 50 ug via INTRAVENOUS

## 2018-05-30 MED ORDER — SODIUM CHLORIDE 0.9 % IV SOLN: INTRAVENOUS | Status: DC

## 2018-05-30 MED ORDER — MIDAZOLAM HCL 2 MG/2ML IJ SOLN
INTRAMUSCULAR | Status: AC
  Filled 2018-05-30: qty 2

## 2018-05-30 MED ORDER — PROPOFOL 500 MG/50ML IV EMUL
INTRAVENOUS | Status: DC | PRN
  Administered 2018-05-30: 50 ug/kg/min via INTRAVENOUS

## 2018-05-30 MED ORDER — LIDOCAINE HCL (PF) 2 % IJ SOLN
INTRAMUSCULAR | Status: DC | PRN
  Administered 2018-05-30: 80 mg

## 2018-05-30 MED ORDER — LIDOCAINE HCL (PF) 2 % IJ SOLN
INTRAMUSCULAR | Status: AC
  Filled 2018-05-30: qty 10

## 2018-05-30 MED ORDER — PROPOFOL 10 MG/ML IV BOLUS
INTRAVENOUS | Status: DC | PRN
  Administered 2018-05-30: 20 mg via INTRAVENOUS
  Administered 2018-05-30 (×2): 30 mg via INTRAVENOUS

## 2018-05-30 MED ORDER — MIDAZOLAM HCL 5 MG/5ML IJ SOLN
INTRAMUSCULAR | Status: DC | PRN
  Administered 2018-05-30 (×2): 2 mg via INTRAVENOUS

## 2018-05-30 NOTE — Op Note (Signed)
[facility] Gastroenterology Patient Name: Stacy Chambers Procedure Date: 05/30/2018 1:14 PM MRN: [id number] Account #: [id number] Date of Birth: 01/29/1966 Admit Type: Outpatient Age: 52 Room: [facility] ENDO ROOM 3 Gender: Female Note Status: Finalized Procedure:            Colonoscopy Indications:          Screening in patient at increased risk: Family history                        of 1st-degree relative with colorectal cancer Providers:            Manya Silvas, MD Referring MD:         Lonie Peak. Dixon (Referring MD) Medicines:            Propofol per Anesthesia Complications:        No immediate complications. Procedure:            Pre-Anesthesia Assessment:                       - After reviewing the risks and benefits, the patient                        was deemed in satisfactory condition to undergo the                        procedure.                       After obtaining informed consent, the colonoscope was                        passed under direct vision. Throughout the procedure,                        the patient's blood pressure, pulse, and oxygen                        saturations were monitored continuously. The                        Colonoscope was introduced through the anus and                        advanced to the the cecum, identified by appendiceal                        orifice and ileocecal valve. The colonoscopy was                        performed without difficulty. The patient tolerated the                        procedure well. The quality of the bowel preparation                        was good. Findings:      Two sessile polyps were found in the rectum. The polyps were diminutive       in size. These polyps were removed with a cold snare. Resection and       retrieval were complete.      Internal  hemorrhoids were found during endoscopy. The hemorrhoids were       small, medium-sized and Grade I (internal hemorrhoids that do not      prolapse).      The exam was otherwise without abnormality.      A few small-mouthed diverticula were found in the sigmoid colon and       ascending colon. Impression:           - Two diminutive polyps in the rectum, removed with a                        cold snare. Resected and retrieved.                       - Internal hemorrhoids. Recommendation:       - Await pathology results. Manya Silvas, MD 05/30/2018 1:40:23 PM This report has been signed electronically. Number of Addenda: 0 Note Initiated On: 05/30/2018 1:14 PM Scope Withdrawal Time: 0 hours 9 minutes 49 seconds  Total Procedure Duration: 0 hours 15 minutes 3 seconds       [facility]

## 2018-05-30 NOTE — H&P (Signed)
Primary Care Physician:  Rennis Golden Primary Gastroenterologist:  Dr. Vira Agar  Pre-Procedure History & Physical: HPI:  Stacy Chambers is a 52 y.o. female is here for an colonoscopy.  Done for family history of colon cancer in mother.   Past Medical History:  Diagnosis Date  . Abnormal blood chemistry 10/03/2015  . Anginal pain ([place])   . Anxiety   . Anxiety, generalized 02/21/2015  . Bipolar affective disorder ([place]) 03/08/2013   Overview:  Last Assessment & Plan:  Continue meds, f/u with psych Restart benzo at 1 po BID prn,    . Bipolar disorder ([place])   . 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 Chronic obstructive pulmonary disease ([place]) 10/03/2015  . COPD (chronic obstructive pulmonary disease) (HCC)    no inhalers  . Elevated liver function tests 11/26/2004   H/O NEGATIVE LIVER BIOPSY, workup negative  . Gallstones 02/21/2015  . GERD (gastroesophageal reflux disease)   . Hyperlipidemia   . Insomnia   . Lower back pain   . Osteoarthritis   . Ovarian cancer ([place]) 1995  . Restless legs syndrome (RLS)   . RLS (restless legs syndrome)   . Sedative, hypnotic or anxiolytic dependence with withdrawal, 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[place])   . Wears dentures    full upper and lower    Past Surgical History:  Procedure Laterality Date  . ABDOMINAL HYSTERECTOMY    . APPENDECTOMY    . BREAST BIOPSY Left    neg- core  . CESAREAN SECTION    . EXCISION MORTON'S NEUROMA Left 10/07/2016   Procedure: EXCISION MORTON'S NEUROMA;  Surgeon: [name], DPM;  Location: [facility];  Service: Podiatry;  Laterality: Left;  IV WITH LOCAL  . LIVER BIOPSY  2005   PerPt: Done to eval elevated LFTs and biopsy provided no definite dx/cause of elevated LFTs  . MULTIPLE TOOTH EXTRACTIONS    . OOPHORECTOMY      Prior to Admission medications   Medication Sig Start Date End Date Taking? Authorizing Provider  alendronate (FOSAMAX) 70 MG tablet Take 1 tablet (70 mg total) by mouth once a week. Take with a full  glass of water on an empty stomach. 03/04/15  Yes Dena Billet B, PA-C  benztropine (COGENTIN) 0.5 MG tablet Take 1 tablet (0.5 mg total) by mouth 2 (two) times daily. 04/04/18  Yes Rainey Pines, MD  Cholecalciferol 5000 units capsule Take 5,000 Units by mouth daily.   Yes [provider]  clonazePAM (KLONOPIN) 0.5 MG tablet Take 1 tablet (0.5 mg total) by mouth daily as needed for anxiety. 04/04/18  Yes Rainey Pines, MD  esomeprazole (NEXIUM) 10 MG packet Take 10 mg by mouth daily as needed.   Yes [provider]  rosuvastatin (CRESTOR) 10 MG tablet TAKE 1 TABLET EVERY DAY 09/16/15  Yes Dena Billet B, PA-C  traZODone (DESYREL) 100 MG tablet Take 1 tablet (100 mg total) by mouth at bedtime. 04/04/18  Yes Rainey Pines, MD  ziprasidone (GEODON) 20 MG capsule Take 1 capsule (20 mg total) by mouth 2 (two) times daily with a meal. 04/04/18  Yes Rainey Pines, MD  fluticasone (FLOVENT HFA) 220 MCG/ACT inhaler Inhale 2 puffs 2 (two) times daily into the lungs. Patient not taking: Reported on 05/30/2018 09/13/17 09/13/18  Laverle Hobby, MD  hydrOXYzine (ATARAX/VISTARIL) 25 MG tablet Take 1 tablet (25 mg total) by mouth 2 (two) times daily. Patient not taking: Reported on 05/30/2018 04/04/18   Rainey Pines, MD  lubiprostone (AMITIZA) 24  MCG capsule Take 1 capsule (24 mcg total) by mouth 2 (two) times daily with a meal. Patient not taking: Reported on 05/30/2018 08/21/15   Dena Billet B, PA-C  MELATONIN-PYRIDOXINE PO Take by mouth.    [provider]  meloxicam (MOBIC) 7.5 MG tablet TAKE 1 TABLET EVERY DAY Patient not taking: Reported on 05/30/2018 07/25/15   Dena Billet B, PA-C  ziprasidone (GEODON) 60 MG capsule Take 1 capsule (60 mg total) by mouth every evening. 04/04/18   Rainey Pines, MD    Allergies as of 03/22/2018 - Review Complete 02/07/2018  Allergen Reaction Noted  . Benadryl [diphenhydramine hcl]  04/15/2011  . Vicodin [hydrocodone-acetaminophen] Other (See Comments)  09/20/2013    Family History  Adopted: Yes  Problem Relation Age of Onset  . COPD Mother   . Depression Mother   . Colon cancer Mother        unsure of age  . Cancer Father        Lung CA, Skin Cancer--?type  . COPD Father   . Stroke Father   . Cancer Sister 75       cervical cancer  . COPD Brother   . Alcohol abuse Brother   . Stroke Brother   . COPD Maternal Aunt   . Depression Daughter   . Schizophrenia Son   . Breast cancer Neg Hx     Social History   Socioeconomic History  . Marital status: Married    Spouse name: Not on file  . Number of children: Not on file  . Years of education: Not on file  . Highest education level: Not on file  Occupational History  . Not on file  Social Needs  . Financial resource strain: Not on file  . Food insecurity:    Worry: Not on file    Inability: Not on file  . Transportation needs:    Medical: Not on file    Non-medical: Not on file  Tobacco Use  . Smoking status: Former Smoker    Packs/day: 1.00    Years: 30.00    Pack years: 30.00    Types: Cigarettes    Last attempt to quit: 04/08/2009    Years since quitting: 9.1  . Smokeless tobacco: Never Used  Substance and Sexual Activity  . Alcohol use: Yes    Alcohol/week: 1.8 - 4.2 oz    Types: 3 - 7 Shots of liquor per week    Comment: occasionally  . Drug use: Yes    Types: Marijuana    Comment: told to refrain until after surgery  . Sexual activity: Yes    Birth control/protection: Surgical  Lifestyle  . Physical activity:    Days per week: Not on file    Minutes per session: Not on file  . Stress: Not on file  Relationships  . Social connections:    Talks on phone: Not on file    Gets together: Not on file    Attends religious service: Not on file    Active member of club or organization: Not on file    Attends meetings of clubs or organizations: Not on file    Relationship status: Not on file  . Intimate partner violence:    Fear of current or ex  partner: Not on file    Emotionally abused: Not on file    Physically abused: Not on file    Forced sexual activity: Not on file  Other Topics Concern  . Not on file  Social History Narrative  . Not on file    Review of Systems: See HPI, otherwise negative ROS  Physical Exam: BP (!) 142/90   Pulse 70   Temp (!) 96 F (35.6 C) (Tympanic)   Resp 20   Ht 5\' 4"  (1.626 m)   Wt 87.1 kg (192 lb)   SpO2 100%   BMI 32.96 kg/m  General:   Alert,  pleasant and cooperative in NAD Head:  Normocephalic and atraumatic. Neck:  Supple; no masses or thyromegaly. Lungs:  Clear throughout to auscultation.    Heart:  Regular rate and rhythm. Abdomen:  Soft, nontender and nondistended. Normal bowel sounds, without guarding, and without rebound.   Neurologic:  Alert and  oriented x4;  grossly normal neurologically.  Impression/Plan: Stacy Chambers is here for an colonoscopy to be performed for FH colon cancer in mother.  Risks, benefits, limitations, and alternatives regarding  colonoscopy have been reviewed with the patient.  Questions have been answered.  All parties agreeable.   Gaylyn Cheers, MD  05/30/2018, 1:12 PM

## 2018-05-30 NOTE — Anesthesia Post-op Follow-up Note (Signed)
Anesthesia QCDR form completed.        

## 2018-05-30 NOTE — Anesthesia Preprocedure Evaluation (Signed)
Anesthesia Evaluation  Patient identified by MRN, date of birth, ID band Patient awake    Reviewed: Allergy & Precautions, H&P , NPO status , Patient's Chart, lab work & pertinent test results, reviewed documented beta blocker date and time   Airway Mallampati: II   Neck ROM: full    Dental  (+) Poor Dentition   Pulmonary neg pulmonary ROS, COPD, former smoker,    Pulmonary exam normal        Cardiovascular Exercise Tolerance: Poor + angina with exertion negative cardio ROS Normal cardiovascular exam Rhythm:regular Rate:Normal     Neuro/Psych PSYCHIATRIC DISORDERS Anxiety Bipolar Disorder negative neurological ROS  negative psych ROS   GI/Hepatic negative GI ROS, Neg liver ROS,   Endo/Other  negative endocrine ROS  Renal/GU negative Renal ROS  negative genitourinary   Musculoskeletal   Abdominal   Peds  Hematology negative hematology ROS (+)   Anesthesia Other Findings Past Medical History: 10/03/2015: Abnormal blood chemistry No date: Anginal pain ([place]) No date: Anxiety 02/21/2015: Anxiety, generalized 03/08/2013: Bipolar affective disorder ([place])     Comment:  Overview:  Last Assessment & Plan:  Continue meds, f/u               with psych Restart benzo at 1 po BID prn,   No date: Bipolar disorder ([place]) 1995: Cancer ([place])     Comment:  uterine 10/03/2015: Chronic obstructive pulmonary disease ([place]) No date: COPD (chronic obstructive pulmonary disease) ([place])     Comment:  no inhalers 11/26/2004: Elevated liver function tests     Comment:  H/O NEGATIVE LIVER BIOPSY, workup negative 02/21/2015: Gallstones No date: Hyperlipidemia No date: Insomnia No date: Lower back pain No date: Osteoarthritis 1995: Ovarian cancer ([place]) No date: Restless legs syndrome (RLS) No date: RLS (restless legs syndrome) No date: Sedative, hypnotic or anxiolytic dependence with withdrawal,  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[place]) No date: Wears dentures   Comment:  full upper and lower Past Surgical History: No date: ABDOMINAL HYSTERECTOMY No date: APPENDECTOMY No date: BREAST BIOPSY; Left     Comment:  neg- core No date: CESAREAN SECTION 10/07/2016: EXCISION MORTON'S NEUROMA; Left     Comment:  Procedure: EXCISION MORTON'S NEUROMA;  Surgeon: [name], DPM;  Location: [facility];  Service:               Podiatry;  Laterality: Left;  IV WITH LOCAL 2005: LIVER BIOPSY     Comment:  PerPt: Done to eval elevated LFTs and biopsy provided no              definite dx/cause of elevated LFTs No date: MULTIPLE TOOTH EXTRACTIONS No date: OOPHORECTOMY   Reproductive/Obstetrics negative OB ROS                             Anesthesia Physical Anesthesia Plan  ASA: III  Anesthesia Plan: General   Post-op Pain Management:    Induction:   PONV Risk Score and Plan:   Airway Management Planned:   Additional Equipment:   Intra-op Plan:   Post-operative Plan:   Informed Consent: I have reviewed the patients History and Physical, chart, labs and discussed the procedure including the risks, benefits and alternatives for the proposed anesthesia with the patient or authorized representative who has indicated his/her understanding and acceptance.   Dental Advisory Given  Plan Discussed with: CRNA  Anesthesia Plan Comments:  Anesthesia Quick Evaluation  

## 2018-05-30 NOTE — Transfer of Care (Signed)
Immediate Anesthesia Transfer of Care Note  Patient: Stacy Chambers  Procedure(s) Performed: COLONOSCOPY WITH PROPOFOL (N/A )  Patient Location: PACU  Anesthesia Type:General  Level of Consciousness: sedated  Airway & Oxygen Therapy: Patient Spontanous Breathing  Post-op Assessment: Report given to RN and Post -op Vital signs reviewed and stable  Post vital signs: Reviewed and stable  Last Vitals:  Vitals Value Taken Time  BP    Temp    Pulse    Resp    SpO2      Last Pain:  Vitals:   05/30/18 1238  TempSrc: Tympanic      Patients Stated Pain Goal: 0 (46/80/32 1224)  Complications: No apparent anesthesia complications

## 2018-05-31 ENCOUNTER — Encounter: Payer: Self-pay | Admitting: Unknown Physician Specialty

## 2018-05-31 LAB — SURGICAL PATHOLOGY

## 2018-05-31 NOTE — Anesthesia Postprocedure Evaluation (Signed)
Anesthesia Post Note  Patient: Stacy Chambers  Procedure(s) Performed: COLONOSCOPY WITH PROPOFOL (N/A )  Patient location during evaluation: PACU Anesthesia Type: General Level of consciousness: awake and alert Pain management: pain level controlled Vital Signs Assessment: post-procedure vital signs reviewed and stable Respiratory status: spontaneous breathing, nonlabored ventilation, respiratory function stable and patient connected to nasal cannula oxygen Cardiovascular status: blood pressure returned to baseline and stable Postop Assessment: no apparent nausea or vomiting Anesthetic complications: no     Last Vitals:  Vitals:   05/30/18 1350 05/30/18 1400  BP: 120/81 131/80  Pulse: (!) 58 (!) 59  Resp: 14 20  Temp:    SpO2: 99% 100%    Last Pain:  Vitals:   05/30/18 1400  TempSrc:   PainSc: 0-No pain                 Molli Barrows

## 2018-06-17 ENCOUNTER — Telehealth: Payer: Self-pay | Admitting: Surgery

## 2018-06-17 NOTE — Telephone Encounter (Signed)
Left a message for the patient to call the office. °

## 2018-06-17 NOTE — Telephone Encounter (Signed)
-----   Message from [place], [place] sent at 06/17/2018  8:23 AM EDT ----- Regarding: FW: appt Can you please schedule a follow up appointment for her to be seen by Dr. Burt Knack to go over colonoscopy results. Thank you!   ----- Message ----- From: Wayna Chalet, CMA Sent: 08/08/2018 To:  Subject: RE: appt                                       Patient will have her Coloscopy done until 06/03/2018 with Dr. Vira Agar. Look for results and then schedule a f/u appointment with Dr. Burt Knack.  ----- Message ----- From: Wayna Chalet, CMA Sent: 03/18/2018 To: Wayna Chalet, CMA Subject: appt                                           Make sure that the patient sees the GI doctor and that they schedule a colonoscopy. Once she had the colosnoscopy done, schedule a f/u appt with Dr. Burt Knack.

## 2018-06-17 NOTE — Telephone Encounter (Signed)
Patients coming in on 06/30/18

## 2018-06-30 ENCOUNTER — Encounter: Payer: Self-pay | Admitting: Surgery

## 2018-06-30 ENCOUNTER — Ambulatory Visit (INDEPENDENT_AMBULATORY_CARE_PROVIDER_SITE_OTHER): Payer: Medicare PPO | Admitting: Surgery

## 2018-06-30 VITALS — BP 102/68 | HR 92 | Resp 14 | Ht 64.0 in | Wt 197.6 lb

## 2018-06-30 DIAGNOSIS — R1032 Left lower quadrant pain: Secondary | ICD-10-CM

## 2018-06-30 NOTE — Patient Instructions (Signed)
Diverticulosis  Diverticulosis is a condition that develops when small pouches (diverticula) form in the wall of the large intestine (colon). The colon is where water is absorbed and stool is formed. The pouches form when the inside layer of the colon pushes through weak spots in the outer layers of the colon. You may have a few pouches or many of them.  What are the causes?  The cause of this condition is not known.  What increases the risk?  The following factors may make you more likely to develop this condition:   Being older than age 60. Your risk for this condition increases with age. Diverticulosis is rare among people younger than age 30. By age 80, many people have it.   Eating a low-fiber diet.   Having frequent constipation.   Being overweight.   Not getting enough exercise.   Smoking.   Taking over-the-counter pain medicines, like aspirin and ibuprofen.   Having a family history of diverticulosis.    What are the signs or symptoms?  In most people, there are no symptoms of this condition. If you do have symptoms, they may include:   Bloating.   Cramps in the abdomen.   Constipation or diarrhea.   Pain in the lower left side of the abdomen.    How is this diagnosed?  This condition is most often diagnosed during an exam for other colon problems. Because diverticulosis usually has no symptoms, it often cannot be diagnosed independently. This condition may be diagnosed by:   Using a flexible scope to examine the colon (colonoscopy).   Taking an X-ray of the colon after dye has been put into the colon (barium enema).   Doing a CT scan.    How is this treated?  You may not need treatment for this condition if you have never developed an infection related to diverticulosis. If you have had an infection before, treatment may include:   Eating a high-fiber diet. This may include eating more fruits, vegetables, and grains.   Taking a fiber supplement.   Taking a live bacteria supplement  (probiotic).   Taking medicine to relax your colon.   Taking antibiotic medicines.    Follow these instructions at home:   Drink 6-8 glasses of water or more each day to prevent constipation.   Try not to strain when you have a bowel movement.   If you have had an infection before:  ? Eat more fiber as directed by your health care provider or your diet and nutrition specialist (dietitian).  ? Take a fiber supplement or probiotic, if your health care provider approves.   Take over-the-counter and prescription medicines only as told by your health care provider.   If you were prescribed an antibiotic, take it as told by your health care provider. Do not stop taking the antibiotic even if you start to feel better.   Keep all follow-up visits as told by your health care provider. This is important.  Contact a health care provider if:   You have pain in your abdomen.   You have bloating.   You have cramps.   You have not had a bowel movement in 3 days.  Get help right away if:   Your pain gets worse.   Your bloating becomes very bad.   You have a fever or chills, and your symptoms suddenly get worse.   You vomit.   You have bowel movements that are bloody or black.   You have   bleeding from your rectum.  Summary   Diverticulosis is a condition that develops when small pouches (diverticula) form in the wall of the large intestine (colon).   You may have a few pouches or many of them.   This condition is most often diagnosed during an exam for other colon problems.   If you have had an infection related to diverticulosis, treatment may include increasing the fiber in your diet, taking supplements, or taking medicines.  This information is not intended to replace advice given to you by your health care provider. Make sure you discuss any questions you have with your health care provider.  Document Released: 07/09/2004 Document Revised: 08/31/2016 Document Reviewed: 08/31/2016  Elsevier Interactive  Patient Education  2017 [employer]vier Inc[employer]

## 2018-06-30 NOTE — Progress Notes (Signed)
Outpatient Surgical Follow Up  06/30/2018  Stacy Chambers is an 52 y.o. female.   CC: Left lower quadrant abdominal pain  HPI: This patient with known gallstones who was seen for gallbladder disease but her symptoms were suggestive of left lower quadrant abdominal pain only without right upper quadrant pain.  She is here for follow-up of a colonoscopy that was performed.  She had hyperplastic polyps in her rectum but had sigmoid diverticulosis.  She denies any abdominal pain since being seen in June.  She is never had right upper quadrant pain.  She had left lower quadrant pain only.  Had no nausea vomiting fevers or chills.  Past Medical History:  Diagnosis Date  . Abnormal blood chemistry 10/03/2015  . Anginal pain ([place])   . Anxiety   . Anxiety, generalized 02/21/2015  . Bipolar affective disorder ([place]) 03/08/2013   Overview:  Last Assessment & Plan:  Continue meds, f/u with psych Restart benzo at 1 po BID prn,    . Bipolar disorder ([place])   . 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 Chronic obstructive pulmonary disease ([place]) 10/03/2015  . COPD (chronic obstructive pulmonary disease) ([facility])    no inhalers  . Elevated liver function tests 11/26/2004   H/O NEGATIVE LIVER BIOPSY, workup negative  . Gallstones 02/21/2015  . GERD (gastroesophageal reflux disease)   . Hyperlipidemia   . Insomnia   . Lower back pain   . Osteoarthritis   . Ovarian cancer ([place]) 1995  . Restless legs syndrome (RLS)   . RLS (restless legs syndrome)   . Sedative, hypnotic or anxiolytic dependence with withdrawal, 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[place])   . Wears dentures    full upper and lower    Past Surgical History:  Procedure Laterality Date  . ABDOMINAL HYSTERECTOMY    . APPENDECTOMY    . BREAST BIOPSY Left    neg- core  . CESAREAN SECTION    . COLONOSCOPY WITH PROPOFOL N/A 05/30/2018   Procedure: COLONOSCOPY WITH PROPOFOL;  Surgeon: Manya Silvas, MD;  Location[facility] ENDOSCOPY;  Service: Endoscopy;  Laterality: N/A;  .  EXCISION MORTON'S NEUROMA Left 10/07/2016   Procedure: EXCISION MORTON'S NEUROMA;  Surgeon: [name], DPM;  Location: [facility];  Service: Podiatry;  Laterality: Left;  IV WITH LOCAL  . LIVER BIOPSY  2005   PerPt: Done to eval elevated LFTs and biopsy provided no definite dx/cause of elevated LFTs  . MULTIPLE TOOTH EXTRACTIONS    . OOPHORECTOMY      Family History  Adopted: Yes  Problem Relation Age of Onset  . COPD Mother   . Depression Mother   . Colon cancer Mother        unsure of age  . Cancer Father        Lung CA, Skin Cancer--?type  . COPD Father   . Stroke Father   . Cancer Sister 13       cervical cancer  . COPD Brother   . Alcohol abuse Brother   . Stroke Brother   . COPD Maternal Aunt   . Depression Daughter   . Schizophrenia Son   . Breast cancer Neg Hx     Social History:  reports that she quit smoking about 9 years ago. Her smoking use included cigarettes. She has a 30.00 pack-year smoking history. She has never used smokeless tobacco. She reports that she drinks about 3.0 - 7.0 standard drinks of alcohol per week. She reports that she has current or past  drug history. Drug: Marijuana.  Allergies:  Allergies  Allergen Reactions  . Benadryl [Diphenhydramine Hcl]     Hives/ rapid heartrate  . Codeine   . Vicodin [Hydrocodone-Acetaminophen] Other (See Comments)    dizziness    Medications reviewed.   Review of Systems:   Review of Systems  Constitutional: Negative.   HENT: Negative.   Eyes: Negative.   Respiratory: Negative.   Cardiovascular: Negative.   Gastrointestinal: Negative for abdominal pain, blood in stool, constipation, diarrhea, heartburn, nausea and vomiting.  Genitourinary: Negative.   Musculoskeletal: Negative.   Skin: Negative.   Neurological: Negative.   Endo/Heme/Allergies: Negative.   Psychiatric/Behavioral: Negative.      Physical Exam:  There were no vitals taken for this visit.  Physical Exam   Constitutional: She is oriented to person, place, and time. She appears well-developed and well-nourished. No distress.  HENT:  Head: Normocephalic and atraumatic.  Eyes: Pupils are equal, round, and reactive to light. EOM are normal. Right eye exhibits no discharge. Left eye exhibits no discharge. No scleral icterus.  Neck: Normal range of motion. Neck supple.  Cardiovascular: Normal rate, regular rhythm and normal heart sounds.  Pulmonary/Chest: Effort normal and breath sounds normal. No stridor. No respiratory distress.  Abdominal: Soft. She exhibits no distension and no mass. There is no tenderness. There is no guarding.  Minimal if any left lower quadrant tenderness without peritoneal signs  Musculoskeletal: Normal range of motion. She exhibits no edema.  Neurological: She is oriented to person, place, and time.  Skin: Skin is warm and dry. She is not diaphoretic. No erythema.  Vitals reviewed.     No results found for this or any previous visit (from the past 48 hour(s)). No results found.  Assessment/Plan:  This patient originally referred over for gallstones.  She is known about her gallstones for many years.  She is never had right upper quadrant pain.  She has had left lower quadrant pain in the past.  She has had no left lower quadrant pain since June and her only findings on colonoscopy were some hyperplastic polyps as well as diverticulosis.  This is likely the cause of her pain.  Currently she is asymptomatic and requires no surgical intervention.  She will follow-up with [name] on an as-needed basis.  Florene Glen, MD, FACS

## 2018-07-04 ENCOUNTER — Ambulatory Visit: Payer: Medicare PPO | Admitting: Psychiatry

## 2018-07-11 ENCOUNTER — Other Ambulatory Visit: Payer: Self-pay

## 2018-07-11 ENCOUNTER — Ambulatory Visit: Payer: Medicare PPO | Admitting: Psychiatry

## 2018-07-11 ENCOUNTER — Encounter: Payer: Self-pay | Admitting: Psychiatry

## 2018-07-11 ENCOUNTER — Telehealth: Payer: Self-pay

## 2018-07-11 VITALS — BP 121/79 | HR 84 | Temp 97.9°F | Wt 197.0 lb

## 2018-07-11 DIAGNOSIS — F3481 Disruptive mood dysregulation disorder: Secondary | ICD-10-CM

## 2018-07-11 DIAGNOSIS — F5101 Primary insomnia: Secondary | ICD-10-CM

## 2018-07-11 MED ORDER — ZIPRASIDONE HCL 20 MG PO CAPS: 20.0000 mg | ORAL_CAPSULE | Freq: Two times a day (BID) | ORAL | 1 refills | Status: DC

## 2018-07-11 MED ORDER — ZIPRASIDONE HCL 60 MG PO CAPS: 60.0000 mg | ORAL_CAPSULE | Freq: Every evening | ORAL | 1 refills | Status: DC

## 2018-07-11 MED ORDER — CARBAMAZEPINE 100 MG PO CHEW: 100.0000 mg | CHEWABLE_TABLET | Freq: Every day | ORAL | 0 refills | Status: DC

## 2018-07-11 MED ORDER — BENZTROPINE MESYLATE 0.5 MG PO TABS: 0.5000 mg | ORAL_TABLET | Freq: Two times a day (BID) | ORAL | 1 refills | Status: DC

## 2018-07-11 MED ORDER — CLONAZEPAM 0.5 MG PO TABS: 0.5000 mg | ORAL_TABLET | Freq: Every day | ORAL | 2 refills | Status: DC | PRN

## 2018-07-11 MED ORDER — HYDROXYZINE HCL 25 MG PO TABS: 25.0000 mg | ORAL_TABLET | Freq: Two times a day (BID) | ORAL | 1 refills | Status: DC

## 2018-07-11 NOTE — Progress Notes (Signed)
BH MD/PA/NP OP Progress Note  07/11/2018 11:32 AM Stacy Chambers  MRN:  [id number]  Subjective:    Patient is a 52 year-old female with history of bipolar disorder who presented for the follow-up appointment. She appeared apprehensive during the interview.  She reported that she has been losing temper quickly and has a stop taking the Geodon but restarted taking it again.  Patient stated that she has been gaining weight and is upset as she cannot see her granddaughter often as she has moved to [place] and has a started going to school.  She is 52 years old.  Patient reported that her husband has to drink and smoke over the weekend and she cannot bring her granddaughter to the house over the weekends.  She has not seen her for the past 1 month.  She was discussing about missing her granddaughter in detail.  She reported that she does not drink or smoke as she has already stopped doing the same.  She smokes weed occasionally.  Patient reported that she loses temper quickly and has noticed that the Geodon is not helping her.  However she is compliant with her medication.  We discussed about different medication options in detail.     She is open to suggestions.  She reported that now she is also been diagnosed with diabetes due to her weight gain.  She has been taking medications for the same.  She is sleeping well at night.  She denied having any suicidal homicidal ideations or plans.  She denied having any perceptual disturbances at this time.        Chief Complaint:  Chief Complaint    Follow-up; Anxiety; Depression; Medication Problem; Stress     Visit Diagnosis:     ICD-10-CM   1. Bipolar I disorder, most recent episode mixed ([place]) F31.60   2. Primary insomnia F51.01     Past Medical History:  Past Medical History:  Diagnosis Date  . Abnormal blood chemistry 10/03/2015  . Anginal pain ([place])   . Anxiety   . Anxiety, generalized 02/21/2015  . Bipolar affective disorder ([place])  03/08/2013   Overview:  Last Assessment & Plan:  Continue meds, f/u with psych Restart benzo at 1 po BID prn,    . Bipolar disorder ([place])   . 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 Chronic obstructive pulmonary disease ([place]) 10/03/2015  . COPD (chronic obstructive pulmonary disease) (HCC)    no inhalers  . Elevated liver function tests 11/26/2004   H/O NEGATIVE LIVER BIOPSY, workup negative  . Gallstones 02/21/2015  . GERD (gastroesophageal reflux disease)   . Hyperlipidemia   . Insomnia   . Lower back pain   . Osteoarthritis   . Ovarian cancer ([place]) 1995  . Restless legs syndrome (RLS)   . RLS (restless legs syndrome)   . Sedative, hypnotic or anxiolytic dependence with withdrawal, 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[place])   . Wears dentures    full upper and lower    Past Surgical History:  Procedure Laterality Date  . ABDOMINAL HYSTERECTOMY    . APPENDECTOMY    . BREAST BIOPSY Left    neg- core  . CESAREAN SECTION    . COLONOSCOPY WITH PROPOFOL N/A 05/30/2018   Procedure: COLONOSCOPY WITH PROPOFOL;  Surgeon: Manya Silvas, MD;  Location[facility] ENDOSCOPY;  Service: Endoscopy;  Laterality: N/A;  . EXCISION MORTON'S NEUROMA Left 10/07/2016   Procedure: EXCISION MORTON'S NEUROMA;  Surgeon: [name], DPM;  Location: [place];  Service:  Podiatry;  Laterality: Left;  IV WITH LOCAL  . LIVER BIOPSY  2005   PerPt: Done to eval elevated LFTs and biopsy provided no definite dx/cause of elevated LFTs  . MULTIPLE TOOTH EXTRACTIONS    . OOPHORECTOMY     Family History:  Family History  Adopted: Yes  Problem Relation Age of Onset  . COPD Mother   . Depression Mother   . Colon cancer Mother        unsure of age  . Cancer Father        Lung CA, Skin Cancer--?type  . COPD Father   . Stroke Father   . Cancer Sister 62       cervical cancer  . COPD Brother   . Alcohol abuse Brother   . Stroke Brother   . COPD Maternal Aunt   . Depression Daughter   . Schizophrenia Son   . Breast cancer Neg Hx     Social History:  Social History   Socioeconomic History  . Marital status: Married    Spouse name: Not on file  . Number of children: Not on file  . Years of education: Not on file  . Highest education level: Not on file  Occupational History  . Not on file  Social Needs  . Financial resource strain: Not on file  . Food insecurity:    Worry: Not on file    Inability: Not on file  . Transportation needs:    Medical: Not on file    Non-medical: Not on file  Tobacco Use  . Smoking status: Former Smoker    Packs/day: 1.00    Years: 30.00    Pack years: 30.00    Types: Cigarettes    Last attempt to quit: 04/08/2009    Years since quitting: 9.2  . Smokeless tobacco: Never Used  Substance and Sexual Activity  . Alcohol use: Yes    Alcohol/week: 3.0 - 7.0 standard drinks    Types: 3 - 7 Shots of liquor per week    Comment: occasionally  . Drug use: Yes    Types: Marijuana    Comment: told to refrain until after surgery  . Sexual activity: Yes    Birth control/protection: Surgical  Lifestyle  . Physical activity:    Days per week: Not on file    Minutes per session: Not on file  . Stress: Not on file  Relationships  . Social connections:    Talks on phone: Not on file    Gets together: Not on file    Attends religious service: Not on file    Active member of club or organization: Not on file    Attends meetings of clubs or organizations: Not on file    Relationship status: Not on file  Other Topics Concern  . Not on file  Social History Narrative  . Not on file   Additional History:   Lives with her husband.  Assessment:   Musculoskeletal: Strength & Muscle Tone: within normal limits Gait & Station: normal Patient leans: N/A  Psychiatric Specialty Exam: Anxiety  Patient reports no insomnia or palpitations.    Medication Refill  Pertinent negatives include no chills, neck pain or vomiting.  Depression         Associated symptoms include does not  have insomnia.  Past medical history includes anxiety.   Drug Problem  Pertinent negatives include no hallucinations. Pertinent negatives include no vomiting.    Review of Systems  Constitutional: Negative for  chills and malaise/fatigue.  HENT: Negative for nosebleeds and tinnitus.   Eyes: Negative for photophobia.  Respiratory: Negative for hemoptysis.   Cardiovascular: Negative for palpitations.  Gastrointestinal: Negative for vomiting.  Genitourinary: Negative for urgency.  Musculoskeletal: Positive for back pain and joint pain. Negative for neck pain.  Skin: Negative for itching.  Neurological: Negative for tremors.  Endo/Heme/Allergies: Negative for environmental allergies.  Psychiatric/Behavioral: Positive for depression. Negative for hallucinations. The patient does not have insomnia.     Blood pressure 121/79, pulse 84, temperature 97.9 F (36.6 C), temperature source Oral, weight 197 lb (89.4 kg).Body mass index is 33.81 kg/m.  General Appearance: Neat  Eye Contact:  Fair  Speech:  Normal Rate  Volume:  Normal  Mood:  Anxious  Affect:  Congruent  Thought Process:  Goal Directed  Orientation:  Full (Time, Place, and Person)  Thought Content:  WDL  Suicidal Thoughts:  No  Homicidal Thoughts:  No  Memory:  Immediate;   Fair  Judgement:  Fair  Insight:  Fair  Psychomotor Activity:  Normal  Concentration:  Fair  Recall:  [employer] of Knowledge: Fair  Language: Fair  Akathisia:  No  Handed:  Right  AIMS (if indicated):  none  Assets:  Communication Skills Desire for Improvement Social Support  ADL's:  Intact  Cognition: WNL  Sleep:  6-7    Is the patient at risk to self?  No. Has the patient been a risk to self in the past 6 months?  No. Has the patient been a risk to self within the distant past?  No. Is the patient a risk to others?  No. Has the patient been a risk to others in the past 6 months?  No. Has the patient been a risk to others within the  distant past?  No.  Current Medications: Current Outpatient Medications  Medication Sig Dispense Refill  . alendronate (FOSAMAX) 70 MG tablet Take 1 tablet (70 mg total) by mouth once a week. Take with a full glass of water on an empty stomach. 12 tablet 3  . benztropine (COGENTIN) 0.5 MG tablet Take 1 tablet (0.5 mg total) by mouth 2 (two) times daily. 180 tablet 1  . Cholecalciferol 5000 units capsule Take 5,000 Units by mouth daily.    . clonazePAM (KLONOPIN) 0.5 MG tablet Take 1 tablet (0.5 mg total) by mouth daily as needed for anxiety. 30 tablet 2  . esomeprazole (NEXIUM) 10 MG packet Take 10 mg by mouth daily as needed.    . fluticasone (FLOVENT HFA) 220 MCG/ACT inhaler Inhale 2 puffs 2 (two) times daily into the lungs. 3 Inhaler 0  . hydrOXYzine (ATARAX/VISTARIL) 25 MG tablet Take 1 tablet (25 mg total) by mouth 2 (two) times daily. 180 tablet 1  . lubiprostone (AMITIZA) 24 MCG capsule Take 1 capsule (24 mcg total) by mouth 2 (two) times daily with a meal. 180 capsule 3  . MELATONIN-PYRIDOXINE PO Take by mouth.    . meloxicam (MOBIC) 7.5 MG tablet TAKE 1 TABLET EVERY DAY 30 tablet 6  . rosuvastatin (CRESTOR) 10 MG tablet TAKE 1 TABLET EVERY DAY 90 tablet 0  . traZODone (DESYREL) 100 MG tablet Take 1 tablet (100 mg total) by mouth at bedtime. 90 tablet 1  . ziprasidone (GEODON) 20 MG capsule Take 1 capsule (20 mg total) by mouth 2 (two) times daily with a meal. 180 capsule 1  . ziprasidone (GEODON) 60 MG capsule Take 1 capsule (60 mg total) by mouth every  evening. 90 capsule 1   No current facility-administered medications for this visit.     Medical Decision Making:  Established Problem, Stable/Improving (1), Review and summation of old records (2) and Review or order medicine tests (1)  Treatment Plan Summary:Medication management   Discussed with patient about the medications and will not change any of her medications.   Continue  Geodon 60 mg po qhs  Continue Geodon 20 mg  twice daily.-She has enough supply. Refilled Klonopin and trazodone. 3 month supply.  Vistaril 10 mg by mouth 2 times a day for anxiety-3 month supply  I will start her on Tegretol 100 mg daily and she agreed with the plan.  Follow-up in 1 month      More than 50% of the time spent in psychoeducation, counseling and coordination of care.    This note was generated in part or whole with voice recognition software. Voice regonition is usually quite accurate but there are transcription errors that can and very often do occur. I apologize for any typographical errors that were not detected and corrected.    Rainey Pines, MD  07/11/2018, 11:32 AM

## 2018-07-11 NOTE — Telephone Encounter (Signed)
faxed and confirmed rx for klonopin  id # [id number] order # [id number] #10 with 2 refills.

## 2018-07-12 LAB — HM DIABETES EYE EXAM

## 2018-07-19 NOTE — Progress Notes (Signed)
Diabetic eye exam abstracted ? ?

## 2018-07-20 ENCOUNTER — Other Ambulatory Visit: Payer: Self-pay | Admitting: Podiatry

## 2018-07-20 DIAGNOSIS — G5762 Lesion of plantar nerve, left lower limb: Secondary | ICD-10-CM

## 2018-08-05 ENCOUNTER — Ambulatory Visit
Admission: RE | Admit: 2018-08-05 | Discharge: 2018-08-05 | Disposition: A | Discharge status: Home / Self Care | Payer: Medicare PPO | Source: Ambulatory Visit | Attending: Podiatry | Admitting: Podiatry

## 2018-08-05 DIAGNOSIS — G5762 Lesion of plantar nerve, left lower limb: Secondary | ICD-10-CM

## 2018-08-05 DIAGNOSIS — M7752 Other enthesopathy of left foot: Secondary | ICD-10-CM | POA: Insufficient documentation

## 2018-08-08 ENCOUNTER — Encounter: Payer: Self-pay | Admitting: Psychiatry

## 2018-08-08 ENCOUNTER — Other Ambulatory Visit: Payer: Self-pay

## 2018-08-08 ENCOUNTER — Ambulatory Visit: Payer: Medicare PPO | Admitting: Psychiatry

## 2018-08-08 VITALS — BP 132/85 | HR 73 | Temp 97.4°F | Wt 196.8 lb

## 2018-08-08 DIAGNOSIS — F5101 Primary insomnia: Secondary | ICD-10-CM | POA: Diagnosis not present

## 2018-08-08 DIAGNOSIS — F3481 Disruptive mood dysregulation disorder: Secondary | ICD-10-CM | POA: Diagnosis not present

## 2018-08-08 MED ORDER — CARBAMAZEPINE 200 MG PO TABS: 200.0000 mg | ORAL_TABLET | Freq: Every day | ORAL | 2 refills | Status: DC
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
## 2018-09-06 ENCOUNTER — Other Ambulatory Visit: Payer: Self-pay | Admitting: Psychiatry

## 2018-09-14 ENCOUNTER — Ambulatory Visit (INDEPENDENT_AMBULATORY_CARE_PROVIDER_SITE_OTHER): Payer: Medicare PPO | Admitting: Family Medicine

## 2018-09-14 ENCOUNTER — Encounter: Payer: Self-pay | Admitting: Family Medicine

## 2018-09-14 VITALS — BP 110/76 | HR 73 | Temp 97.9°F | Ht 64.0 in | Wt 198.1 lb

## 2018-09-14 DIAGNOSIS — E669 Obesity, unspecified: Secondary | ICD-10-CM

## 2018-09-14 DIAGNOSIS — Z0000 Encounter for general adult medical examination without abnormal findings: Secondary | ICD-10-CM

## 2018-09-14 DIAGNOSIS — M7662 Achilles tendinitis, left leg: Secondary | ICD-10-CM

## 2018-09-14 DIAGNOSIS — R7303 Prediabetes: Secondary | ICD-10-CM

## 2018-09-14 DIAGNOSIS — E785 Hyperlipidemia, unspecified: Secondary | ICD-10-CM

## 2018-09-14 DIAGNOSIS — N6310 Unspecified lump in the right breast, unspecified quadrant: Secondary | ICD-10-CM

## 2018-09-14 DIAGNOSIS — Z1239 Encounter for other screening for malignant neoplasm of breast: Secondary | ICD-10-CM

## 2018-09-14 DIAGNOSIS — Z6834 Body mass index (BMI) 34.0-34.9, adult: Secondary | ICD-10-CM

## 2018-09-14 DIAGNOSIS — Z9181 History of falling: Secondary | ICD-10-CM

## 2018-09-14 DIAGNOSIS — M810 Age-related osteoporosis without current pathological fracture: Secondary | ICD-10-CM

## 2018-09-14 DIAGNOSIS — Z780 Asymptomatic menopausal state: Secondary | ICD-10-CM

## 2018-09-14 NOTE — Progress Notes (Addendum)
Subjective:   Stacy Chambers is a 52 y.o. female who presents for Medicare Annual (Subsequent) preventive examination.   Abd pain and diarrhea - had CT in June 2019, did not follow up - will come back to discuss, she continues to have fairly constant abd problems.  DM eye exam - 07/12/18 done  Psych stuff managed by [facility]  Review of Systems:  Review of Systems  Constitutional: Negative.   HENT: Negative.   Eyes: Negative.   Respiratory: Negative.   Cardiovascular: Negative.   Gastrointestinal: Negative.   Endocrine: Negative.   Genitourinary: Negative.   Musculoskeletal: Negative.   Skin: Negative.   Allergic/Immunologic: Negative.   Neurological: Negative.   Hematological: Negative.   Psychiatric/Behavioral: Negative.   All other systems reviewed and are negative.   Cardiac Risk Factors include: sedentary lifestyle;obesity (BMI >30kg/m2);dyslipidemia     Objective:     Vitals: BP 110/76   Pulse 73   Temp 97.9 F (36.6 C) (Oral)   Ht 5\' 4"  (1.626 m)   Wt 198 lb 2 oz (89.9 kg)   SpO2 96%   BMI 34.01 kg/m   Body mass index is 34.01 kg/m.  Advanced Directives 09/14/2018 09/14/2018 05/30/2018 10/07/2016 10/29/2014  Does Patient Have a Medical Advance Directive? No No No No No  Would patient like information on creating a medical advance directive? Yes (Inpatient - patient defers creating a medical advance directive at this time) Yes (Inpatient - patient requests chaplain consult to create a medical advance directive) No - Patient declined No - Patient declined No - patient declined information  Some encounter information is confidential and restricted. Go to Review Flowsheets activity to see all data.    Tobacco Social History   Tobacco Use  Smoking Status Former Smoker  . Packs/day: 1.00  . Years: 30.00  . Pack years: 30.00  . Types: Cigarettes  . Last attempt to quit: 04/08/2009  . Years since quitting: 9.4  Smokeless Tobacco Never Used     Counseling  given: Not Answered   Clinical Intake:  Pre-visit preparation completed: No  Pain : No/denies pain     BMI - recorded: 34 Nutritional Status: BMI > 30  Obese Diabetes: No        Information entered by :: LT  Past Medical History:  Diagnosis Date  . Abnormal blood chemistry 10/03/2015  . Anginal pain ([place])   . Anxiety   . Anxiety, generalized 02/21/2015  . Bipolar affective disorder ([place]) 03/08/2013   Overview:  Last Assessment & Plan:  Continue meds, f/u with psych Restart benzo at 1 po BID prn,    . Bipolar disorder ([place])   . 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 Chronic obstructive pulmonary disease ([place]) 10/03/2015  . COPD (chronic obstructive pulmonary disease) ([facility])    no inhalers  . Elevated liver function tests 11/26/2004   H/O NEGATIVE LIVER BIOPSY, workup negative  . Gallstones 02/21/2015  . GERD (gastroesophageal reflux disease)   . Hyperlipidemia   . Insomnia   . Lower back pain   . Osteoarthritis   . Ovarian cancer ([place]) 1995  . Restless legs syndrome (RLS)   . RLS (restless legs syndrome)   . Sedative, hypnotic or anxiolytic dependence with withdrawal, 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[place])   . Wears dentures    full upper and lower   Past Surgical History:  Procedure Laterality Date  . ABDOMINAL HYSTERECTOMY    . APPENDECTOMY    . BREAST BIOPSY Left  neg- core  . CESAREAN SECTION    . COLONOSCOPY WITH PROPOFOL N/A 05/30/2018   Procedure: COLONOSCOPY WITH PROPOFOL;  Surgeon: Manya Silvas, MD;  Location: [facility] ENDOSCOPY;  Service: Endoscopy;  Laterality: N/A;  . EXCISION MORTON'S NEUROMA Left 10/07/2016   Procedure: EXCISION MORTON'S NEUROMA;  Surgeon: [name], DPM;  Location: [facility];  Service: Podiatry;  Laterality: Left;  IV WITH LOCAL  . LIVER BIOPSY  2005   PerPt: Done to eval elevated LFTs and biopsy provided no definite dx/cause of elevated LFTs  . MULTIPLE TOOTH EXTRACTIONS    . OOPHORECTOMY     Family History  Adopted: Yes  Problem Relation  Age of Onset  . COPD Mother   . Depression Mother   . Colon cancer Mother        unsure of age  . Cancer Father        Lung CA, Skin Cancer--?type  . COPD Father   . Stroke Father   . Cancer Sister 79       cervical cancer  . COPD Brother   . Alcohol abuse Brother   . Stroke Brother   . COPD Maternal Aunt   . Depression Daughter   . Schizophrenia Son   . Breast cancer Neg Hx    Social History   Socioeconomic History  . Marital status: Married    Spouse name: Not on file  . Number of children: Not on file  . Years of education: Not on file  . Highest education level: Not on file  Occupational History  . Not on file  Social Needs  . Financial resource strain: Not on file  . Food insecurity:    Worry: Not on file    Inability: Not on file  . Transportation needs:    Medical: Not on file    Non-medical: Not on file  Tobacco Use  . Smoking status: Former Smoker    Packs/day: 1.00    Years: 30.00    Pack years: 30.00    Types: Cigarettes    Last attempt to quit: 04/08/2009    Years since quitting: 9.4  . Smokeless tobacco: Never Used  Substance and Sexual Activity  . Alcohol use: Yes    Alcohol/week: 3.0 - 7.0 standard drinks    Types: 3 - 7 Shots of liquor per week    Comment: occasionally  . Drug use: Yes    Types: Marijuana    Comment: told to refrain until after surgery  . Sexual activity: Yes    Birth control/protection: Surgical  Lifestyle  . Physical activity:    Days per week: Not on file    Minutes per session: Not on file  . Stress: Not on file  Relationships  . Social connections:    Talks on phone: Not on file    Gets together: Not on file    Attends religious service: Not on file    Active member of club or organization: Not on file    Attends meetings of clubs or organizations: Not on file    Relationship status: Not on file  Other Topics Concern  . Not on file  Social History Narrative  . Not on file    Outpatient Encounter  Medications as of 09/14/2018  Medication Sig  . alendronate (FOSAMAX) 70 MG tablet Take 1 tablet (70 mg total) by mouth once a week. Take with a full glass of water on an empty stomach.  . benztropine (COGENTIN) 0.5 MG tablet  Take 1 tablet (0.5 mg total) by mouth 2 (two) times daily.  . carbamazepine (TEGRETOL) 200 MG tablet Take 1 tablet (200 mg total) by mouth at bedtime.  Marland Kitchen esomeprazole (NEXIUM) 10 MG packet Take 10 mg by mouth daily as needed.  . hydrOXYzine (ATARAX/VISTARIL) 25 MG tablet Take 1 tablet (25 mg total) by mouth 2 (two) times daily.  . meloxicam (MOBIC) 7.5 MG tablet TAKE 1 TABLET EVERY DAY  . rosuvastatin (CRESTOR) 10 MG tablet TAKE 1 TABLET EVERY DAY  . traZODone (DESYREL) 100 MG tablet Take 1 tablet (100 mg total) by mouth at bedtime.  . ziprasidone (GEODON) 20 MG capsule Take 1 capsule (20 mg total) by mouth 2 (two) times daily with a meal.  . [DISCONTINUED] Cholecalciferol 5000 units capsule Take 5,000 Units by mouth daily.  . [DISCONTINUED] clonazePAM (KLONOPIN) 0.5 MG tablet Take 1 tablet (0.5 mg total) by mouth daily as needed for anxiety.  . [DISCONTINUED] lubiprostone (AMITIZA) 24 MCG capsule Take 1 capsule (24 mcg total) by mouth 2 (two) times daily with a meal.  . [DISCONTINUED] fluticasone (FLOVENT HFA) 220 MCG/ACT inhaler Inhale 2 puffs 2 (two) times daily into the lungs.  . [DISCONTINUED] MELATONIN-PYRIDOXINE PO Take by mouth.  . [DISCONTINUED] ziprasidone (GEODON) 60 MG capsule Take 1 capsule (60 mg total) by mouth every evening.   No facility-administered encounter medications on file as of 09/14/2018.     Activities of Daily Living In your present state of health, do you have any difficulty performing the following activities: 09/14/2018 09/14/2018  Hearing? N N  Vision? N N  Difficulty concentrating or making decisions? N N  Walking or climbing stairs? N N  Dressing or bathing? N N  Doing errands, shopping? N N  Preparing Food and eating ? N -  Using  the Toilet? N -  In the past six months, have you accidently leaked urine? N -  Do you have problems with loss of bowel control? N -  Managing your Medications? N -  Managing your Finances? N -  Housekeeping or managing your Housekeeping? N -  Some recent data might be hidden    Patient Care Team: Rennis Golden as PCP - General (Physician Assistant)    Assessment:   This is a routine wellness examination for Shivonne.  Exercise Activities and Dietary recommendations Current Exercise Habits: The patient does not participate in regular exercise at present, Exercise limited by: orthopedic condition(s)  Goals   None     Fall Risk Fall Risk  09/14/2018  Falls in the past year? 0  Number falls in past yr: 0  Injury with Fall? 0  Risk for fall due to : Impaired mobility;Impaired balance/gait  Follow up Falls prevention discussed;Education provided;Falls evaluation completed   Is the patient's home free of loose throw rugs in walkways, pet beds, electrical cords, etc?   yes      Grab bars in the bathroom? no      Handrails on the stairs?   yes      Adequate lighting?   yes  Timed Get Up and Go performed: < 12 s  Depression Screen PHQ 2/9 Scores 09/14/2018 04/27/2018  PHQ - 2 Score 5 5  PHQ- 9 Score 19 20      Office Visit from 09/14/2018 in [facility]  AUDIT-C Score  2       Cognitive Function Cognitive Testing   Alert? Yes  Normal Appearance?Yes  Oriented to person? Yes  Place? Yes  Time? Yes  Recall of three objects? Yes  Can perform simple calculations? Yes  Displays appropriate judgment?Yes  Can read the correct time from a watch face?Yes           Immunization History  Administered Date(s) Administered  . Influenza,inj,Quad PF,6+ Mos 06/26/2014  . Tdap 11/22/2013    Qualifies for Shingles Vaccine? Not indicated   Screening Tests Health Maintenance  Topic Date Due  . MAMMOGRAM  04/09/2018  . INFLUENZA VACCINE  05/26/2018    . HIV Screening  09/15/2019 (Originally 07/10/1981)  . COLONOSCOPY  05/31/2023  . TETANUS/TDAP  11/23/2023   Defers HIV screening  Cancer Screenings: Lung: Low Dose CT Chest recommended if Age 11-80 years, 30 pack-year currently smoking OR have quit w/in 15years. Patient does not qualify. Breast:  Up to date on Mammogram? No - due for it - HM dates and frequency to be updated  - annual Up to date of Bone Density/Dexa? No will order, she is young for Dexa but some scans incidentally note osteoporosis, pt reports early menopause and a lot of arthritis Colorectal: done - HM adjusted to due every 5 years Flu done today         Plan:        ICD-10-CM   1. Encounter for Medicare annual wellness exam Z00.00   2. Screening for malignant neoplasm of breast Z12.39 MM 3D SCREEN BREAST BILATERAL    CANCELED: MM Digital Screening  3. Class 1 obesity with body mass index (BMI) of 34.0 to 34.9 in adult, unspecified obesity type, unspecified whether serious comorbidity present E66.9 Lipid panel   Z68.34 Hemoglobin A1c  4. Hyperlipidemia, unspecified hyperlipidemia type E78.5 Lipid panel  5. Osteoporosis without current pathological fracture, unspecified osteoporosis type A07.6 COMPLETE METABOLIC PANEL WITH GFR    Lipid panel    Hemoglobin A1c    DG Bone Density  6. Prediabetes R73.03 Hemoglobin A1c  7. Postmenopausal estrogen deficiency Z78.0 DG Bone Density  8. Left Achilles tendinitis M76.62 Ambulatory referral to Physical Therapy  9. At risk for falls Z91.81 Ambulatory referral to Physical Therapy     I have personally reviewed and noted the following in the patient's chart:   . Medical and social history . Use of alcohol, tobacco or illicit drugs  . Current medications and supplements . Functional ability and status . Nutritional status . Physical activity . Advanced directives . List of other physicians . Hospitalizations, surgeries, and ER visits in previous 12  months . Vitals . Screenings to include cognitive, depression, and falls . Referrals and appointments  In addition, I have reviewed and discussed with patient certain preventive protocols, quality metrics, and best practice recommendations. A written personalized care plan for preventive services as well as general preventive health recommendations were provided to patient.     Delsa Grana, PA-C  09/14/2018

## 2018-09-14 NOTE — Patient Instructions (Addendum)

## 2018-09-15 LAB — COMPLETE METABOLIC PANEL WITH GFR
AG Ratio: 2 (calc) (ref 1.0–2.5)
ALBUMIN MSPROF: 4.4 g/dL (ref 3.6–5.1)
ALKALINE PHOSPHATASE (APISO): 137 U/L — AB (ref 33–130)
ALT: 39 U/L — AB (ref 6–29)
AST: 26 U/L (ref 10–35)
BILIRUBIN TOTAL: 0.2 mg/dL (ref 0.2–1.2)
BUN: 13 mg/dL (ref 7–25)
CHLORIDE: 103 mmol/L (ref 98–110)
CO2: 29 mmol/L (ref 20–32)
CREATININE: 0.69 mg/dL (ref 0.50–1.05)
Calcium: 9.5 mg/dL (ref 8.6–10.4)
GFR, Est African American: 115 mL/min/{1.73_m2} (ref >=60)
GFR, Est Non African American: 99 mL/min/{1.73_m2} (ref >=60)
GLUCOSE: 92 mg/dL (ref 65–99)
Globulin: 2.2 g/dL (calc) (ref 1.9–3.7)
Potassium: 4 mmol/L (ref 3.5–5.3)
Sodium: 140 mmol/L (ref 135–146)
TOTAL PROTEIN: 6.6 g/dL (ref 6.1–8.1)

## 2018-09-15 LAB — HEMOGLOBIN A1C
EAG (MMOL/L): 6.3 (calc)
Hgb A1c MFr Bld: 5.6 % of total Hgb (ref <5.7)
MEAN PLASMA GLUCOSE: 114 (calc)

## 2018-09-15 LAB — LIPID PANEL
CHOL/HDL RATIO: 3.4 (calc) (ref <5.0)
CHOLESTEROL: 169 mg/dL (ref <200)
HDL: 50 mg/dL — ABNORMAL LOW (ref >50)
LDL CHOLESTEROL (CALC): 84 mg/dL
Non-HDL Cholesterol (Calc): 119 mg/dL (calc) (ref <130)
TRIGLYCERIDES: 263 mg/dL — AB (ref <150)

## 2018-09-15 NOTE — Telephone Encounter (Signed)
pt left a message that she needs a refill on her klonopin.

## 2018-09-15 NOTE — Telephone Encounter (Signed)
pt states she is trying to taper her off of the medication but it is not working. pt has appt for jan 2020.

## 2018-09-16 ENCOUNTER — Ambulatory Visit: Payer: Medicare PPO | Admitting: Family Medicine

## 2018-09-16 MED ORDER — CLONAZEPAM 0.5 MG PO TABS: 0.5000 mg | ORAL_TABLET | Freq: Every day | ORAL | 0 refills | Status: DC | PRN

## 2018-09-16 NOTE — Telephone Encounter (Signed)
Patient called reporting she is extremely anxious and that she needs her Klonopin refilled.  Return call to patient and discussed her concern.  She reports she understands she is being weaned off of Klonopin however her anxiety is through the roof and she needs medications readjusted.  Discussed with patient that Dr. Gretel Acre is not in office this week.  Discussed with patient that writer will give her a few days supply of Klonopin and that a message will be sent to Dr. Gretel Acre.  I have reviewed medical records from Dr. Gretel Acre as well as reviewed [facility] controlled substance database.

## 2018-09-19 ENCOUNTER — Encounter: Payer: Self-pay | Admitting: Family Medicine

## 2018-09-21 ENCOUNTER — Ambulatory Visit: Payer: Medicare PPO | Admitting: Family Medicine

## 2018-09-21 ENCOUNTER — Encounter: Payer: Self-pay | Admitting: Family Medicine

## 2018-09-21 NOTE — Progress Notes (Signed)
Simple sugars can cause elevated triglycerides.  We can mail her information about hypertriglyceridemia.  Sometimes it just runs in families/genetics.  Glycemic index is one of the best things to tx with diet.  Eating low glycemic index can help.   Please have her recheck in 3 months instead of in 6, will recheck lipids and liver, can come fasting for labs and we'll see if we need to do a follow up appointment once we get repeated labs. (I've them ordered) We do not need to treat with medicines unless it is moderately elevated > 500, so tell her not to worry.

## 2018-09-21 NOTE — Progress Notes (Signed)
Results for orders placed or performed in visit on 09/14/18  COMPLETE METABOLIC PANEL WITH GFR  Result Value Ref Range   Glucose, Bld 92 65 - 99 mg/dL   BUN 13 7 - 25 mg/dL   Creat 0.69 0.50 - 1.05 mg/dL   GFR, Est Non African American 99 > OR = 60 mL/min/1.51m2   GFR, Est African American 115 > OR = 60 mL/min/1.60m2   BUN/Creatinine Ratio NOT APPLICABLE 6 - 22 (calc)   Sodium 140 135 - 146 mmol/L   Potassium 4.0 3.5 - 5.3 mmol/L   Chloride 103 98 - 110 mmol/L   CO2 29 20 - 32 mmol/L   Calcium 9.5 8.6 - 10.4 mg/dL   Total Protein 6.6 6.1 - 8.1 g/dL   Albumin 4.4 3.6 - 5.1 g/dL   Globulin 2.2 1.9 - 3.7 g/dL (calc)   AG Ratio 2.0 1.0 - 2.5 (calc)   Total Bilirubin 0.2 0.2 - 1.2 mg/dL   Alkaline phosphatase (APISO) 137 (H) 33 - 130 U/L   AST 26 10 - 35 U/L   ALT 39 (H) 6 - 29 U/L  Lipid panel  Result Value Ref Range   Cholesterol 169 <200 mg/dL   HDL 50 (L) >50 mg/dL   Triglycerides 263 (H) <150 mg/dL   LDL Cholesterol (Calc) 84 mg/dL (calc)   Total CHOL/HDL Ratio 3.4 <5.0 (calc)   Non-HDL Cholesterol (Calc) 119 <130 mg/dL (calc)  Hemoglobin A1c  Result Value Ref Range   Hgb A1c MFr Bld 5.6 <5.7 % of total Hgb   Mean Plasma Glucose 114 (calc)   eAG (mmol/L) 6.3 (calc)   Discussed with pt through lab results her elevated triglycerides, she also has some very minimally elevated liver enzymes  We will send the patient information about hypertriglyceridemia - and diet to help with triglycerides.  Encouraged to recheck labs in about 3 months with CMP and fasting lipids

## 2018-09-29 ENCOUNTER — Ambulatory Visit: Payer: Medicare PPO | Admitting: Family Medicine

## 2018-09-29 VITALS — BP 120/76 | HR 70 | Temp 98.3°F | Wt 198.2 lb

## 2018-09-29 DIAGNOSIS — R1030 Lower abdominal pain, unspecified: Secondary | ICD-10-CM

## 2018-09-29 DIAGNOSIS — K582 Mixed irritable bowel syndrome: Secondary | ICD-10-CM

## 2018-09-29 DIAGNOSIS — E781 Pure hyperglyceridemia: Secondary | ICD-10-CM

## 2018-09-29 NOTE — Progress Notes (Signed)
Patient ID: Stacy Chambers, female    DOB: 08/29/1966, 52 y.o.   MRN: [id number]  PCP: Orlena Sheldon, PA-C  Chief Complaint  Patient presents with  . Abdominal Pain    has issues with diverticulitis states that she gets cramping. Also needs refills on her medications.    Subjective:   Stacy Chambers is a 52 y.o. female, presents to clinic with CC of follow up on chronic abd pain/IBS/diverticulitis.  She described frequent bowel discomfort that is intermittent, mild to moderate in nature, ongoing for several months, described as cramping, pain is immediately before and shortly after having urgent bowel movements.  She currently has about 1 day of hard stool, followed by 1 day without a bowel movement and then several days of diarrhea-  stool is described as a "sponge filled with air" denies any blood, mucus, melena Diarrhea days has at least 5 BM a day, has to stay close to the bathroom, has tinismus with abdominal cramping across lower abd and after BM has abd pain resolved  She has been having diarrhea today, currently has no abdominal cramping or pain.  She has had no fever, chills, sweats, weight loss, rash, nausea, vomiting.  She says that she went to a gastroenterologist to have the specific symptoms evaluated, she had a colonoscopy and she was told she has diverticulitis.  She reports she has never followed up for this and we did briefly discuss this at her recent Medicare well visit so she is here today to recheck.  Her pain today is not as bad as it can be or has been over the past several months.  Is not had any weight loss, fatigue, pallor, palpitations, chest pain, weakness, near-syncope.  No recent fever sweats chills nausea vomiting urinary symptoms vaginal symptoms. Patient states she has not using any stool softeners and has not for a couple months.  In the past when she had more constipation she would take 1 dose of amitiza and it would consider into very loose bowels.  She is  currently naturally flipping over into loose bowels for several days so she has not taken amitiza in quite some time.  PHQ was highly positive when asked about this she states that she is just fine she is bipolar manic depressive with mania and has been shopping a lot lately running up her credit cards, she denies any suicidal ideation or plan, she is going to see her psychiatrist this week.  She denies SI, HI, AVH.  She is compliant with her medications.  She also wanted to discuss today and follow-up on her triglycerides.  They were elevated with her recent labs.  She states that she eats very healthy, does not eat any carbs or fats she eats a lot of fruits and vegetables.  She has not had any history of elevated triglycerides.  She does occasionally drink and larger volumes of alcohol but not daily.  She states she is compliant with her Crestor 10 mg without any side effects of that.  Patient Active Problem List   Diagnosis Date Noted  . Chronic obstructive pulmonary disease ([place]) 10/03/2015  . Abnormal blood chemistry 10/03/2015  . HLD (hyperlipidemia) 10/03/2015  . Restless leg 04/09/2015  . Osteoporosis 03/04/2015  . 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 Bipolar affective disorder, current episode manic with psychotic symptoms ([place]) 02/21/2015  . Anxiety, generalized 02/21/2015  . Gallstones 02/21/2015  . Insomnia, persistent 02/21/2015  . Restless leg syndrome 02/21/2015  . Affective  psychosis, bipolar ([place]) 02/21/2015  . Calculus of gallbladder 02/21/2015  . Cannot sleep 02/21/2015  . Atypical chest pain 10/30/2014  . Benzodiazepine withdrawal ([place]) 10/30/2014  . Chest pain 10/30/2014  . 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 Bipolar disorder ([place]) 03/08/2013  . Bipolar affective disorder ([place]) 03/08/2013  . COPD (chronic obstructive pulmonary disease) ([place])   . Hyperlipidemia   . Elevated liver function tests   . Restless legs syndrome ([facility])      Prior to Admission  medications   Medication Sig Start Date End Date Taking? Authorizing Provider  alendronate (FOSAMAX) 70 MG tablet Take 1 tablet (70 mg total) by mouth once a week. Take with a full glass of water on an empty stomach. 03/04/15  Yes Dena Billet B, PA-C  ammonium lactate (AMLACTIN) 12 % cream Apply topically as needed for dry skin.   Yes [provider]  benztropine (COGENTIN) 0.5 MG tablet Take 1 tablet (0.5 mg total) by mouth 2 (two) times daily. 07/11/18  Yes Rainey Pines, MD  carbamazepine (TEGRETOL) 200 MG tablet Take 1 tablet (200 mg total) by mouth at bedtime. 08/08/18  Yes Rainey Pines, MD  clonazePAM (KLONOPIN) 0.5 MG tablet Take 1 tablet (0.5 mg total) by mouth daily as needed for anxiety. 09/16/18  Yes Ursula Alert, MD  esomeprazole (NEXIUM) 10 MG packet Take 10 mg by mouth daily as needed.   Yes [provider]  fluticasone (FLOVENT HFA) 220 MCG/ACT inhaler Inhale 2 puffs into the lungs 2 (two) times daily.   Yes [provider]  hydrOXYzine (ATARAX/VISTARIL) 25 MG tablet Take 1 tablet (25 mg total) by mouth 2 (two) times daily. 07/11/18  Yes Rainey Pines, MD  lubiprostone (AMITIZA) 24 MCG capsule Take by mouth. 08/21/15  Yes [provider]  Melatonin-Pyridoxine 5-1 MG TABS Take by mouth. 08/01/15  Yes [provider]  meloxicam (MOBIC) 7.5 MG tablet TAKE 1 TABLET EVERY DAY 07/25/15  Yes Dena Billet B, PA-C  rosuvastatin (CRESTOR) 10 MG tablet TAKE 1 TABLET EVERY DAY 09/16/15  Yes Dena Billet B, PA-C  traZODone (DESYREL) 100 MG tablet Take 1 tablet (100 mg total) by mouth at bedtime. 04/04/18  Yes Rainey Pines, MD  ziprasidone (GEODON) 20 MG capsule Take 1 capsule (20 mg total) by mouth 2 (two) times daily with a meal. 07/11/18  Yes Rainey Pines, MD     Allergies  Allergen Reactions  . Benadryl [Diphenhydramine Hcl]     Hives/ rapid heartrate  . Codeine   . Vicodin [Hydrocodone-Acetaminophen] Other (See Comments)    dizziness     Family  History  Adopted: Yes  Problem Relation Age of Onset  . COPD Mother   . Depression Mother   . Colon cancer Mother        unsure of age  . Cancer Father        Lung CA, Skin Cancer--?type  . COPD Father   . Stroke Father   . Cancer Sister 28       cervical cancer  . COPD Brother   . Alcohol abuse Brother   . Stroke Brother   . COPD Maternal Aunt   . Depression Daughter   . Schizophrenia Son   . Breast cancer Neg Hx      Social History   Socioeconomic History  . Marital status: Married    Spouse name: Not on file  . Number of children: Not on file  . Years of education: Not on file  . Highest  education level: Not on file  Occupational History  . Not on file  Social Needs  . Financial resource strain: Not on file  . Food insecurity:    Worry: Not on file    Inability: Not on file  . Transportation needs:    Medical: Not on file    Non-medical: Not on file  Tobacco Use  . Smoking status: Former Smoker    Packs/day: 1.00    Years: 30.00    Pack years: 30.00    Types: Cigarettes    Last attempt to quit: 04/08/2009    Years since quitting: 9.4  . Smokeless tobacco: Never Used  Substance and Sexual Activity  . Alcohol use: Yes    Alcohol/week: 3.0 - 7.0 standard drinks    Types: 3 - 7 Shots of liquor per week    Comment: occasionally  . Drug use: Yes    Types: Marijuana    Comment: told to refrain until after surgery  . Sexual activity: Yes    Birth control/protection: Surgical  Lifestyle  . Physical activity:    Days per week: Not on file    Minutes per session: Not on file  . Stress: Not on file  Relationships  . Social connections:    Talks on phone: Not on file    Gets together: Not on file    Attends religious service: Not on file    Active member of club or organization: Not on file    Attends meetings of clubs or organizations: Not on file    Relationship status: Not on file  . Intimate partner violence:    Fear of current or ex partner: Not on  file    Emotionally abused: Not on file    Physically abused: Not on file    Forced sexual activity: Not on file  Other Topics Concern  . Not on file  Social History Narrative  . Not on file     Review of Systems  Constitutional: Negative.   HENT: Negative.   Eyes: Negative.   Respiratory: Negative.   Cardiovascular: Negative.   Gastrointestinal: Negative.   Endocrine: Negative.   Genitourinary: Negative.   Musculoskeletal: Negative.   Skin: Negative.   Allergic/Immunologic: Negative.   Neurological: Negative.   Hematological: Negative.   Psychiatric/Behavioral: Negative.   All other systems reviewed and are negative.      Objective:    Vitals:   09/29/18 1005  BP: 120/76  Pulse: 70  Temp: 98.3 F (36.8 C)  TempSrc: Oral  SpO2: 97%  Weight: 198 lb 4 oz (89.9 kg)      Physical Exam  Constitutional: She is oriented to person, place, and time. She appears well-developed and well-nourished.  Non-toxic appearance. No distress.  HENT:  Head: Normocephalic and atraumatic.  Right Ear: External ear normal.  Left Ear: External ear normal.  Nose: Nose normal.  Mouth/Throat: Uvula is midline and mucous membranes are normal. No oropharyngeal exudate.  OP clear, oral mucosa mildly dry  Eyes: Pupils are equal, round, and reactive to light. Conjunctivae, EOM and lids are normal.  Neck: Normal range of motion and phonation normal. Neck supple. No tracheal deviation present.  Cardiovascular: Normal rate, regular rhythm, normal heart sounds and normal pulses. Exam reveals no gallop and no friction rub.  No murmur heard. Pulses:      Radial pulses are 2+ on the right side, and 2+ on the left side.       Posterior tibial pulses are 2+  on the right side, and 2+ on the left side.  Pulmonary/Chest: Effort normal and breath sounds normal. No stridor. No respiratory distress. She has no wheezes. She has no rhonchi. She has no rales. She exhibits no tenderness.  Abdominal: Soft.  Normal appearance and bowel sounds are normal. She exhibits no distension, no pulsatile midline mass and no mass. There is no tenderness. There is no rigidity, no rebound, no guarding, no CVA tenderness, no tenderness at McBurney's point and negative Murphy's sign.  Musculoskeletal: Normal range of motion. She exhibits no edema or deformity.  Lymphadenopathy:    She has no cervical adenopathy.  Neurological: She is alert and oriented to person, place, and time. She exhibits normal muscle tone. Gait normal.  Skin: Skin is warm, dry and intact. Capillary refill takes less than 2 seconds. No rash noted. She is not diaphoretic. No pallor.  Psychiatric: She has a normal mood and affect. Her speech is normal and behavior is normal.          Assessment & Plan:      ICD-10-CM   1. Irritable bowel syndrome with both constipation and diarrhea K58.2   2. Lower abdominal pain R10.30   3. Hypertriglyceridemia N23.5 COMPLETE METABOLIC PANEL WITH GFR    Lipid panel    Feels patient's alternating abdominal symptoms are likely from irritable bowel syndrome she has more predominant diarrhea type with more days with this.  She has been seen by gastroenterology, she had a colonoscopy recently, she does not clinically appear to have any diverticulitis currently she has totally benign abdominal exam.  Printed out and reviewed information regarding diet fiber and fluids to treat irritable bowel.  She is not using any laxatives or stool softeners currently but rarely when she is very constipated she will take Amitiza.  Do believe diet exercise or nutritional evaluation and counseling may help improve her symptoms.  He also discussed her elevated triglycerides, discussed diet and exercise for this.  She was instructed to refer to a glycemic index try and avoid simple sugars, high glycemic index foods, believes some of her medications may be contributing towards this with the side effect of causing some metabolic  syndrome.  She was instructed to avoid alcohol intake in excess.  Her levels in the 200s and I described how treatment is initiated when she is over 500 so there is little time to watch this and she will repeat labs in about 4 months   Delsa Grana, PA-C 09/29/18 10:30 AM

## 2018-09-29 NOTE — Patient Instructions (Addendum)
Look up glycemic index (GI) of foods - several charts available -   Foods with GI under 55 you want to have in your diet, foods with GI over 75 you want to avoid  Diet for Irritable Bowel Syndrome When you have irritable bowel syndrome (IBS), the foods you eat and your eating habits are very important. IBS may cause various symptoms, such as abdominal pain, constipation, or diarrhea. Choosing the right foods can help ease discomfort caused by these symptoms. Work with your health care provider and dietitian to find the best eating plan to help control your symptoms. What general guidelines do I need to follow?  Keep a food diary. This will help you identify foods that cause symptoms. Write down: ? What you eat and when. ? What symptoms you have. ? When symptoms occur in relation to your meals.  Avoid foods that cause symptoms. Talk with your dietitian about other ways to get the same nutrients that are in these foods.  Eat more foods that contain fiber. Take a fiber supplement if directed by your dietitian.  Eat your meals slowly, in a relaxed setting.  Aim to eat 5-6 small meals per day. Do not skip meals.  Drink enough fluids to keep your urine clear or pale yellow.  Ask your health care provider if you should take an over-the-counter probiotic during flare-ups to help restore healthy gut bacteria.  If you have cramping or diarrhea, try making your meals low in fat and high in carbohydrates. Examples of carbohydrates are pasta, rice, whole grain breads and cereals, fruits, and vegetables.  If dairy products cause your symptoms to flare up, try eating less of them. You might be able to handle yogurt better than other dairy products because it contains bacteria that help with digestion. What foods are not recommended? The following are some foods and drinks that may worsen your symptoms:  Fatty foods, such as [place] fries.  Milk products, such as cheese or ice  cream.  Chocolate.  Alcohol.  Products with caffeine, such as coffee.  Carbonated drinks, such as soda.  The items listed above may not be a complete list of foods and beverages to avoid. Contact your dietitian for more information. What foods are good sources of fiber? Your health care provider or dietitian may recommend that you eat more foods that contain fiber. Fiber can help reduce constipation and other IBS symptoms. Add foods with fiber to your diet a little at a time so that your body can get used to them. Too much fiber at once might cause gas and swelling of your abdomen. The following are some foods that are good sources of fiber:  Apples.  Peaches.  Pears.  Berries.  Figs.  Broccoli (raw).  Cabbage.  Carrots.  Raw peas.  Kidney beans.  Lima beans.  Whole grain bread.  Whole grain cereal.  Where to find more information: [employer] for Functional Gastrointestinal Disorders: www.iffgd.[employer] of Diabetes and Digestive and Kidney Diseases: NetworkAffair.co.za.aspx This information is not intended to replace advice given to you by your health care provider. Make sure you discuss any questions you have with your health care provider. Document Released: 01/02/2004 Document Revised: 03/19/2016 Document Reviewed: 01/12/2014 Elsevier Interactive Patient Education  2018 [employer].

## 2018-09-30 ENCOUNTER — Encounter: Payer: Self-pay | Admitting: Family Medicine

## 2018-10-07 ENCOUNTER — Telehealth: Payer: Self-pay | Admitting: Family Medicine

## 2018-10-07 MED ORDER — ROSUVASTATIN CALCIUM 10 MG PO TABS: 10.0000 mg | ORAL_TABLET | Freq: Every day | ORAL | 0 refills | Status: DC

## 2018-10-07 MED ORDER — MELOXICAM 7.5 MG PO TABS: 7.5000 mg | ORAL_TABLET | Freq: Every day | ORAL | 6 refills | Status: DC

## 2018-10-07 NOTE — Telephone Encounter (Signed)
Yes can do 90 d with refills for crestor - she has labs and follow up arranged Mobic - renal function was good.  I don't know why she was on this, I would do a 30 day supply no refills.  Thanks

## 2018-10-07 NOTE — Telephone Encounter (Signed)
Refills on medication sent to pharmacy and patient notified and verbalized understanding.

## 2018-10-07 NOTE — Telephone Encounter (Signed)
Patient called in requesting a refill on her Crestor and Meloxicam. Please advise?

## 2018-10-12 ENCOUNTER — Encounter: Payer: Self-pay | Admitting: Psychiatry

## 2018-10-12 ENCOUNTER — Ambulatory Visit: Payer: Medicare PPO | Admitting: Psychiatry

## 2018-10-12 ENCOUNTER — Other Ambulatory Visit: Payer: Self-pay

## 2018-10-12 ENCOUNTER — Other Ambulatory Visit: Payer: Self-pay | Admitting: Psychiatry

## 2018-10-12 VITALS — BP 122/75 | HR 67 | Temp 97.8°F | Wt 197.6 lb

## 2018-10-12 DIAGNOSIS — Z9189 Other specified personal risk factors, not elsewhere classified: Secondary | ICD-10-CM | POA: Diagnosis not present

## 2018-10-12 DIAGNOSIS — F3162 Bipolar disorder, current episode mixed, moderate: Secondary | ICD-10-CM

## 2018-10-12 DIAGNOSIS — F411 Generalized anxiety disorder: Secondary | ICD-10-CM

## 2018-10-12 MED ORDER — ZIPRASIDONE HCL 80 MG PO CAPS: 80.0000 mg | ORAL_CAPSULE | Freq: Every day | ORAL | 1 refills | Status: DC

## 2018-10-12 MED ORDER — ZIPRASIDONE HCL 20 MG PO CAPS: 20.0000 mg | ORAL_CAPSULE | Freq: Every day | ORAL | 0 refills | Status: DC

## 2018-10-12 MED ORDER — LITHIUM CARBONATE 300 MG PO TABS: 300.0000 mg | ORAL_TABLET | Freq: Two times a day (BID) | ORAL | 1 refills | Status: DC

## 2018-10-12 MED ORDER — CLONAZEPAM 0.5 MG PO TABS: 0.5000 mg | ORAL_TABLET | Freq: Every day | ORAL | 0 refills | Status: DC | PRN

## 2018-10-12 NOTE — Patient Instructions (Addendum)
Lithium tablets or capsules What is this medicine? LITHIUM (LITH ee um) is used to prevent and treat the manic episodes caused by manic-depressive illness. This medicine may be used for other purposes; ask your health care provider or pharmacist if you have questions. COMMON BRAND NAME(S): Eskalith What should I tell my health care provider before I take this medicine? They need to know if you have any of these conditions: -active infection -breathing problems -Brugada Syndrome -dehydration (diarrhea or sweating) -diet low in salt -heart disease -high levels of calcium in the blood -history of irregular heartbeat -kidney disease -low level of potassium or sodium in the blood -parathyroid disease -problems urinating -thyroid disease -an unusual or allergic reaction to lithium, other medicines, foods, dyes, or preservatives -pregnant or trying to get pregnant -breast-feeding How should I use this medicine? Take this medicine by mouth with a glass of water. Follow the directions on the prescription label. Take after a meal or snack to avoid stomach upset. Take your doses at regular intervals. Do not take your medicine more often than directed. The amount of this medicine you take is very important. Taking more than the prescribed dose can cause serious side effects. Do not stop taking except on the advice of your doctor or health care professional. Talk to your pediatrician regarding the use of this medicine in children. Special care may be needed. While this drug may be prescribed for children as young as 7 years for selected conditions, precautions do apply. Overdosage: If you think you have taken too much of this medicine contact a poison control center or emergency room at once. NOTE: This medicine is only for you. Do not share this medicine with others. What if I miss a dose? If you miss a dose, take it as soon as you can. If it is almost time for your next dose, take only that dose.  Do not take double or extra doses. What may interact with this medicine? Do not take this medicine with any of the following medications: -cisapride -dofetilide -dronedarone -pimozide -thioridazine This medicine may also interact with the following medications: -buspirone -caffeine -calcium iodide -carbamazepine -certain medicines for depression, anxiety, or psychotic disturbances -certain medicines for high blood pressure -certain medicines for migraine headache like almotriptan, eletriptan, frovatriptan, naratriptan, rizatriptan, sumatriptan, zolmitriptan -diuretics -fentanyl -linezolid -MAOIs like Carbex, Eldepryl, Marplan, Nardil, and Parnate -medicines that relax muscles for surgery -metronidazole -NSAIDs, medicines for pain and inflammation, like ibuprofen or naproxen -other medicines that prolong the QT interval (cause an abnormal heart rhythm) -phenytoin -potassium iodide, KI -sodium bicarbonate -sodium chloride -St. John's Wort -theophylline -tramadol -tryptophan -urea This list may not describe all possible interactions. Give your health care provider a list of all the medicines, herbs, non-prescription drugs, or dietary supplements you use. Also tell them if you smoke, drink alcohol, or use illegal drugs. Some items may interact with your medicine. What should I watch for while using this medicine? Visit your doctor or health care professional for regular checks on your progress. It can take several weeks of treatment before you start to get better. The amount of salt (sodium) in your body influences the effects of this medicine, and this medicine can increase salt loss from the body. Eat a normal diet that includes salt. Do not change to salt substitutes. Avoid changes involving diet, or medications that include large amounts of sodium like sodium bicarbonate. Ask your doctor or health care professional for advice if you are not sure. Drink plenty of fluids  while you  are taking this medicine. Avoid drinks that contain caffeine, such as coffee, tea and colas. You will need extra fluids if you have diarrhea or sweat a lot. This will help prevent toxic effects from this medicine. Be careful not to get overheated during exercise, saunas, hot baths, and hot weather. Consult your doctor or health care professional if you have a high fever or persistent diarrhea. You may get drowsy or dizzy. Do not drive, use machinery, or do anything that needs mental alertness until you know how this medicine affects you. Do not stand or sit up quickly, especially if you are an older patient. This reduces the risk of dizzy or fainting spells. What side effects may I notice from receiving this medicine? Side effects that you should report to your doctor or health care professional as soon as possible: -allergic reactions like skin rash, itching or hives, swelling of the face, lips, or tongue -breathing problems -changes in emotions or moods -changes in vision -fingers or toes feel numb, cool, painful -hallucinations, loss of contact with reality -increased thirst -increased urination -mild tremor (hands) -persistent headache with or without blurred vision -signs and symptoms of a dangerous change in heartbeat or heart rhythm like chest pain; dizziness; fast, irregular heartbeat; palpitations; feeling faint or lightheaded; falls; breathing problems -signs and symptoms of lithium toxicity like diarrhea, vomiting, increased tremor, loss of balance or coordination, drowsiness, ringing of the ears, muscle weakness or twitching, slurred speech, confusion, seizures -unusually weak or tired Side effects that usually do not require medical attention (report to your doctor or health care professional if they continue or are bothersome): -decreased appetite -mild thirst -nausea -stomach upset -tiredness This list may not describe all possible side effects. Call your doctor for medical  advice about side effects. You may report side effects to FDA at 1-800-FDA-1088. Where should I keep my medicine? Keep out of the reach of children. Store at room temperature between 15 and 30 degrees C (59 and 86 degrees F). Throw away any unused medicine after the expiration date. NOTE: This sheet is a summary. It may not cover all possible information. If you have questions about this medicine, talk to your doctor, pharmacist, or health care provider.  2019 Elsevier/Gold Standard (2017-08-18 09:11:16)   PLEASE GET EKG SINCE YOU ARE ON HIGH RISK MEDICATIONS FOR QTC MONITORING.

## 2018-10-12 NOTE — Progress Notes (Signed)
Stacy Chambers  10/12/2018 12:21 PM LORALYE LOBERG  MRN:  [id number]  Chief Complaint: ' I am here for follow up." Chief Complaint    Follow-up; Medication Refill; Depression; Other     HPI: Stacy Chambers is a 52 year old Caucasian female, married, lives in [place], is a history of bipolar disorder, anxiety disorder, abnormal liver function test , COPD, chronic back pain, RLS, presented to the clinic today for a follow-up visit.  This is patient's first visit with Probation officer.  She used to follow-up with Dr. Gretel Acre here in clinic since the past several years.  Patient today reports worsening mood lability, manic symptoms, anxiety symptoms and so on.  She reports she has these episodes where she is angry ,irritable ,has high energy , racing thoughts and also depressive symptoms.  Patient reports she had some suicidal thoughts a week ago however she was able to talk to her husband.  She reports she feels better now with regards to her suicidality.  Patient reports she was recently started on carbamazepine by her provider however she does not think the medication is helpful.  Patient reports trials of Zoloft, Geodon, Saphris, BuSpar, Topamax, Risperdal and so on in the past.  She also reports being tried on lithium in the past but does not remember what happened on the lithium.  She however reports she is willing to give it a retrial today.  Patient does have a history of elevated liver function test and hence carbamazepine cannot be increased since it can have an impact on her liver enzymes.  This was discussed with patient.  Pt with several psychosocial stressors including the death of her son who was 58 years old who passed away while in the [place] 10 years ago, relationship struggles with her daughter and an older son, relationship problems with her husband .  Discussed referral for psychotherapy sessions.  Patient agrees with plan.  Also discussed with patient that if her symptoms  continue to worsen she may benefit from IOP or inpatient mental health admissions.  Patient reports good social support from a friend and reports she can reach out to her if her symptoms worsen and asked for help.   Visit Diagnosis:    ICD-10-CM   1. Bipolar 1 disorder, mixed, moderate (HCC) F31.62 EKG 12-Lead    CANCELED: Electrocardiogram report  2. GAD (generalized anxiety disorder) F41.1   3. At risk for long QT syndrome Z91.89 EKG 12-Lead    CANCELED: Electrocardiogram report    Past Psychiatric History: Pt reports one inpatient mental health admission where she voluntarily went into the hospital at [facility] 10 years ago soon after the death of her son.  She reports she stayed only for a day.  Patient used to follow-up with Dr. Gretel Acre here in this clinic in the past.  Previously she used to be with [facility].  Patient reports one suicide attempt 10 years ago when she overdosed on over-the-counter sleep medication.  She reports she was never treated for the same since it did not do anything.  Past Medical History:  Past Medical History:  Diagnosis Date  . Abnormal blood chemistry 10/03/2015  . Anginal pain ([place])   . Anxiety   . Anxiety, generalized 02/21/2015  . Bipolar affective disorder ([place]) 03/08/2013   Overview:  Last Assessment & Plan:  Continue meds, f/u with psych Restart benzo at 1 po BID prn,    . Bipolar disorder ([place])   . 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  Chronic obstructive pulmonary disease ([place]) 10/03/2015  . COPD (chronic obstructive pulmonary disease) (HCC)    no inhalers  . Elevated liver function tests 11/26/2004   H/O NEGATIVE LIVER BIOPSY, workup negative  . Gallstones 02/21/2015  . GERD (gastroesophageal reflux disease)   . Hyperlipidemia   . Insomnia   . Lower back pain   . Osteoarthritis   . Ovarian cancer ([place]) 1995  . Restless legs syndrome (RLS)   . RLS (restless legs syndrome)   . Sedative, hypnotic or anxiolytic dependence with withdrawal, 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[place])    . Wears dentures    full upper and lower    Past Surgical History:  Procedure Laterality Date  . ABDOMINAL HYSTERECTOMY    . APPENDECTOMY    . BREAST BIOPSY Left    neg- core  . CESAREAN SECTION    . COLONOSCOPY WITH PROPOFOL N/A 05/30/2018   Procedure: COLONOSCOPY WITH PROPOFOL;  Surgeon: Manya Silvas, MD;  Location: [facility] ENDOSCOPY;  Service: Endoscopy;  Laterality: N/A;  . EXCISION MORTON'S NEUROMA Left 10/07/2016   Procedure: EXCISION MORTON'S NEUROMA;  Surgeon: [name], DPM;  Location: [place];  Service: Podiatry;  Laterality: Left;  IV WITH LOCAL  . LIVER BIOPSY  2005   PerPt: Done to eval elevated LFTs and biopsy provided no definite dx/cause of elevated LFTs  . MULTIPLE TOOTH EXTRACTIONS    . OOPHORECTOMY      Family Psychiatric History: As per documented below.  Family History:  Family History  Adopted: Yes  Problem Relation Age of Onset  . COPD Mother   . Depression Mother   . Colon cancer Mother        unsure of age  . Cancer Father        Lung CA, Skin Cancer--?type  . COPD Father   . Stroke Father   . Cancer Sister 14       cervical cancer  . COPD Brother   . Alcohol abuse Brother   . Stroke Brother   . COPD Maternal Aunt   . Depression Daughter   . Schizophrenia Son   . Breast cancer Neg Hx     Social History: Patient is married.  She lives in [place].  She has a daughter and a son-living.  One son passed away 10 years ago while in [employer]. Social History   Socioeconomic History  . Marital status: Married    Spouse name: Not on file  . Number of children: Not on file  . Years of education: Not on file  . Highest education level: Not on file  Occupational History  . Not on file  Social Needs  . Financial resource strain: Not on file  . Food insecurity:    Worry: Not on file    Inability: Not on file  . Transportation needs:    Medical: Not on file    Non-medical: Not on file  Tobacco Use  . Smoking status: Former  Smoker    Packs/day: 1.00    Years: 30.00    Pack years: 30.00    Types: Cigarettes    Last attempt to quit: 04/08/2009    Years since quitting: 9.5  . Smokeless tobacco: Never Used  Substance and Sexual Activity  . Alcohol use: Yes    Alcohol/week: 3.0 - 7.0 standard drinks    Types: 3 - 7 Shots of liquor per week    Comment: occasionally  . Drug use: Yes    Types: Marijuana    Comment:  told to refrain until after surgery  . Sexual activity: Yes    Birth control/protection: Surgical  Lifestyle  . Physical activity:    Days per week: Not on file    Minutes per session: Not on file  . Stress: Not on file  Relationships  . Social connections:    Talks on phone: Not on file    Gets together: Not on file    Attends religious service: Not on file    Active member of club or organization: Not on file    Attends meetings of clubs or organizations: Not on file    Relationship status: Not on file  Other Topics Concern  . Not on file  Social History Narrative  . Not on file    Allergies:  Allergies  Allergen Reactions  . Benadryl [Diphenhydramine Hcl]     Hives/ rapid heartrate  . Codeine   . Vicodin [Hydrocodone-Acetaminophen] Other (See Comments)    dizziness    Metabolic Disorder Labs: Lab Results  Component Value Date   HGBA1C 5.6 09/14/2018   MPG 114 09/14/2018   No results found for: PROLACTIN Lab Results  Component Value Date   CHOL 169 09/14/2018   TRIG 263 (H) 09/14/2018   HDL 50 (L) 09/14/2018   CHOLHDL 3.4 09/14/2018   VLDL 18 03/04/2015   LDLCALC 84 09/14/2018   LDLCALC 87 03/04/2015   Lab Results  Component Value Date   TSH 1.514 10/08/2014   TSH 0.822 07/11/2014    Therapeutic Level Labs: Lab Results  Component Value Date   LITHIUM 0.49 (L) 01/13/2009   No results found for: VALPROATE No components found for:  CBMZ  Current Medications: Current Outpatient Medications  Medication Sig Dispense Refill  . alendronate (FOSAMAX) 70 MG  tablet Take 1 tablet (70 mg total) by mouth once a week. Take with a full glass of water on an empty stomach. 12 tablet 3  . ammonium lactate (AMLACTIN) 12 % cream Apply topically as needed for dry skin.    Marland Kitchen benztropine (COGENTIN) 0.5 MG tablet Take 1 tablet (0.5 mg total) by mouth 2 (two) times daily. 180 tablet 1  . esomeprazole (NEXIUM) 10 MG packet Take 10 mg by mouth daily as needed.    . fluticasone (FLOVENT HFA) 220 MCG/ACT inhaler Inhale 2 puffs into the lungs 2 (two) times daily.    . hydrOXYzine (ATARAX/VISTARIL) 25 MG tablet Take 1 tablet (25 mg total) by mouth 2 (two) times daily. 180 tablet 1  . lubiprostone (AMITIZA) 24 MCG capsule Take by mouth.    . Melatonin-Pyridoxine 5-1 MG TABS Take by mouth.    . meloxicam (MOBIC) 7.5 MG tablet Take 1 tablet (7.5 mg total) by mouth daily. 30 tablet 6  . rosuvastatin (CRESTOR) 10 MG tablet Take 1 tablet (10 mg total) by mouth daily. 90 tablet 0  . traZODone (DESYREL) 100 MG tablet Take 1 tablet (100 mg total) by mouth at bedtime. 90 tablet 1  . clonazePAM (KLONOPIN) 0.5 MG tablet Take 1 tablet (0.5 mg total) by mouth daily as needed for anxiety. 20 tablet 0  . lithium 300 MG tablet Take 1 tablet (300 mg total) by mouth 2 (two) times daily. 60 tablet 1  . ziprasidone (GEODON) 20 MG capsule Take 1 capsule (20 mg total) by mouth daily with breakfast. 30 capsule 0  . ziprasidone (GEODON) 80 MG capsule Take 1 capsule (80 mg total) by mouth daily with supper. 30 capsule 1   No current facility-administered  medications for this visit.      Musculoskeletal: Strength & Muscle Tone: within normal limits Gait & Station: normal Patient leans: N/A  Psychiatric Specialty Exam: Review of Systems  Psychiatric/Behavioral: Positive for depression. The patient is nervous/anxious.   All other systems reviewed and are negative.   Blood pressure 122/75, pulse 67, temperature 97.8 F (36.6 C), temperature source Oral, weight 197 lb 9.6 oz (89.6 kg).Body  mass index is 33.92 kg/m.  General Appearance: Casual  Eye Contact:  Fair  Speech:  Clear and Coherent  Volume:  Normal  Mood:  Anxious, Dysphoric and Irritable  Affect:  Labile  Thought Process:  Goal Directed and Descriptions of Associations: Intact  Orientation:  Full (Time, Place, and Person)  Thought Content: Logical   Suicidal Thoughts:  No  Homicidal Thoughts:  No  Memory:  Immediate;   Fair Recent;   Fair Remote;   Fair  Judgement:  Fair  Insight:  Fair  Psychomotor Activity:  Normal  Concentration:  Concentration: Fair and Attention Span: Fair  Recall:  [employer] of Knowledge: Fair  Language: Fair  Akathisia:  No  Handed:  Right  AIMS (if indicated): denies tremors, rigidity,stiffness  Assets:  Communication Skills Desire for Improvement Social Support  ADL's:  Intact  Cognition: WNL  Sleep:  Fair   Screenings: AIMS     Office Visit from 04/04/2018 in [facility] Office Visit from 01/03/2018 in [facility] Office Visit from 11/18/2016 in [place] Total Score  0  0  0    PHQ2-9     Office Visit from 09/29/2018 in [facility] Visit from 09/14/2018 in [facility] Office Visit from 04/27/2018 in [place] [place]  PHQ-2 Total Score  4  5  5   PHQ-9 Total Score  21  19  20        Assessment and Plan: Stacy Chambers is a 52 year old Caucasian female, married, lives in [place], history of bipolar disorder, anxiety disorder, COPD, RLS, elevated liver enzymes, presented to the clinic today for a follow-up visit.  Patient with psychosocial stressors of the death of her son, relationship struggles with her husband and other children.  Patient also is biologically predisposed given her family history of mental health problems.  Patient reports current medications as not helpful and hence will make the following readjustment.  Patient will  also benefit from psychotherapy sessions.  Hence we will refer her for the same.  Plan Bipolar disorder Increase Geodon to 100 mg p.o. daily in divided dosage.  She is currently on 80 mg daily. Cogentin 0.5 mg p.o. twice daily for EPS. Discontinue carbamazepine since she reports it was not helpful and also she has a history of elevated liver enzymes. Start lithium 300 mg p.o. twice daily. Will order lithium levels along with CBC, CMP,TSH, she will get it done in 1 week.  [facility]. Discussed getting an EKG since she is on lithium and Geodon.  She will get it done with her PMD.  Generalized anxiety disorder Patient will benefit from psychotherapy sessions. Will continue Klonopin 0.5 mg p.o. daily as needed.  Discussed with patient the risk of being on benzodiazepine therapy.  She will limit use. She has hydroxyzine available as needed if she needs it.  For insomnia Continue trazodone 100 mg p.o. nightly.  Reviewed medical records from Dr. Sherlon Handing progress Chambers dated 08/08/2018. Patient will benefit from psychotherapy  sessions and hence will refer her to our therapist here in clinic.  Discussed with patient the need for frequent visits to readjust her medications as well as to be compliant with therapy appointments.  Follow-up in clinic in 10 days or sooner if needed.  More than 50 % of the time was spent for psychoeducation and supportive psychotherapy and care coordination.  This Chambers was generated in part or whole with voice recognition software. Voice recognition is usually quite accurate but there are transcription errors that can and very often do occur. I apologize for any typographical errors that were not detected and corrected.           Ursula Alert, MD 10/12/2018, 12:21 PM

## 2018-10-20 ENCOUNTER — Encounter: Payer: Self-pay | Admitting: Family Medicine

## 2018-10-20 ENCOUNTER — Telehealth: Payer: Self-pay | Admitting: Family Medicine

## 2018-10-20 DIAGNOSIS — F3162 Bipolar disorder, current episode mixed, moderate: Secondary | ICD-10-CM

## 2018-10-20 DIAGNOSIS — Z9189 Other specified personal risk factors, not elsewhere classified: Secondary | ICD-10-CM

## 2018-10-20 NOTE — Telephone Encounter (Signed)
Received a fax from [facility] in [place] with a order for EKG twelve-lead for associated diagnosis of bipolar 1 disorder and at risk for long QT syndrome.  Most recent EKG in chart from 2016  We will need to contact the patient to have her come in to get a EKG done, and will just verify with her that psychiatry did not perform an EKG elsewhere like at [facility] with her most recent visits.  Delsa Grana, PA-C 2:19 PM 10/20/18

## 2018-10-20 NOTE — Telephone Encounter (Signed)
Left message return call

## 2018-10-21 ENCOUNTER — Other Ambulatory Visit: Payer: Self-pay

## 2018-10-21 ENCOUNTER — Encounter: Payer: Self-pay | Admitting: Family Medicine

## 2018-10-21 ENCOUNTER — Ambulatory Visit (INDEPENDENT_AMBULATORY_CARE_PROVIDER_SITE_OTHER): Payer: Medicare PPO | Admitting: Family Medicine

## 2018-10-21 VITALS — BP 118/64 | HR 64 | Temp 98.6°F | Resp 14 | Ht 64.0 in | Wt 199.0 lb

## 2018-10-21 DIAGNOSIS — Z79899 Other long term (current) drug therapy: Secondary | ICD-10-CM

## 2018-10-21 DIAGNOSIS — Z9189 Other specified personal risk factors, not elsewhere classified: Secondary | ICD-10-CM | POA: Diagnosis not present

## 2018-10-21 DIAGNOSIS — Z5181 Encounter for therapeutic drug level monitoring: Secondary | ICD-10-CM | POA: Diagnosis not present

## 2018-10-21 DIAGNOSIS — F3162 Bipolar disorder, current episode mixed, moderate: Secondary | ICD-10-CM | POA: Diagnosis not present

## 2018-10-21 DIAGNOSIS — R7989 Other specified abnormal findings of blood chemistry: Secondary | ICD-10-CM

## 2018-10-21 DIAGNOSIS — R945 Abnormal results of liver function studies: Secondary | ICD-10-CM

## 2018-10-21 NOTE — Patient Instructions (Signed)
Go to [facility] location ASAP following instruction written on the order - go in the morning before you take your morning lithium dose.    Still have future fasting labs before next follow up visit here.  That should be in ~3 months

## 2018-10-21 NOTE — Progress Notes (Signed)
Patient ID: Stacy Chambers, female    DOB: 1966/05/19, 52 y.o.   MRN: [id number]  PCP: Delsa Grana, PA-C  Chief Complaint  Patient presents with  . Labs    has lab orders from Psych    Subjective:   Stacy Chambers is a 52 y.o. female, presents to clinic with CC of need for labs and EKG per her new Psychiatrist.  She has with her a lab slip to [facility] for lithium, CBC, BMP and TSH.  I received a fax with a copy of an order for an EKG because she is at risk for QTC prolongation.   Patient recently changed psychiatrist at [facility], now seeing Dr. Shea Evans for bipolar 1 disorder mixed and moderate, with recent worsening mood lability manic and anxiety symptoms.  She was on carbamazepine, had previously been treated with Zoloft Geodon BuSpar Topamax Risperdal and lithium in the past.  At her visit on 10/12/2018 Geodon was increased 200 mg p.o. in divided doses, Cogentin continued twice daily for EPS.  Carbamazepine discontinued.  Starting lithium 300 mg p.o. twice daily.  She supposed to go to psychotherapy sessions for generalized anxiety disorder and is going to continue Klonopin and hydroxyzine as needed for insomnia she is continuing trazodone she today states that she has not been on melatonin for a long time and would like it taken off her chart.  Was instructed to follow-up in 10 days and to get labs done in 7 days at [facility].  Patient has not done them but did present today for an EKG.  She states that it would be "lucky if she got the labs done at [place]".    Patient states that her moods have improved with medication changes.  She has side effect of weight gain.  Her chronic diarrhea is unchanged.  She denies any palpitations, chest pain, near-syncope.  OV from psychiatry reviewed from 10/12/18   Patient Active Problem List   Diagnosis Date Noted  . At risk for long QT syndrome 10/20/2018  . Chronic obstructive pulmonary disease ([place]) 10/03/2015  .  Abnormal blood chemistry 10/03/2015  . HLD (hyperlipidemia) 10/03/2015  . Restless leg 04/09/2015  . Osteoporosis 03/04/2015  . 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 Bipolar affective disorder, current episode manic with psychotic symptoms ([place]) 02/21/2015  . Anxiety, generalized 02/21/2015  . Gallstones 02/21/2015  . Insomnia, persistent 02/21/2015  . Restless leg syndrome 02/21/2015  . Affective psychosis, bipolar ([place]) 02/21/2015  . Calculus of gallbladder 02/21/2015  . Cannot sleep 02/21/2015  . Atypical chest pain 10/30/2014  . Benzodiazepine withdrawal ([place]) 10/30/2014  . Chest pain 10/30/2014  . 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 Bipolar disorder ([place]) 03/08/2013  . Bipolar affective disorder ([place]) 03/08/2013  . COPD (chronic obstructive pulmonary disease) ([place])   . Hyperlipidemia   . Elevated liver function tests   . Restless legs syndrome (RLS)      Prior to Admission medications   Medication Sig Start Date End Date Taking? Authorizing Provider  alendronate (FOSAMAX) 70 MG tablet Take 1 tablet (70 mg total) by mouth once a week. Take with a full glass of water on an empty stomach. 03/04/15  Yes Dena Billet B, PA-C  ammonium lactate (AMLACTIN) 12 % cream Apply topically as needed for dry skin.   Yes [provider]  benztropine (COGENTIN) 0.5 MG tablet Take 1 tablet (0.5 mg total) by mouth 2 (two) times daily. 07/11/18  Yes Rainey Pines, MD  clonazePAM Bobbye [place])  0.5 MG tablet Take 1 tablet (0.5 mg total) by mouth daily as needed for anxiety. 10/12/18  Yes Ursula Alert, MD  esomeprazole (NEXIUM) 10 MG packet Take 10 mg by mouth daily as needed.   Yes [provider]  fluticasone (FLOVENT HFA) 220 MCG/ACT inhaler Inhale 2 puffs into the lungs 2 (two) times daily.   Yes [provider]  hydrOXYzine (ATARAX/VISTARIL) 25 MG tablet Take 1 tablet (25 mg total) by mouth 2 (two) times daily. 07/11/18  Yes Rainey Pines, MD  lithium 300 MG tablet Take  1 tablet (300 mg total) by mouth 2 (two) times daily. 10/12/18  Yes Ursula Alert, MD  lubiprostone (AMITIZA) 24 MCG capsule Take by mouth. 08/21/15  Yes [provider]  Melatonin-Pyridoxine 5-1 MG TABS Take by mouth. 08/01/15  Yes [provider]  meloxicam (MOBIC) 7.5 MG tablet Take 1 tablet (7.5 mg total) by mouth daily. 10/07/18  Yes Delsa Grana, PA-C  rosuvastatin (CRESTOR) 10 MG tablet Take 1 tablet (10 mg total) by mouth daily. 10/07/18  Yes Delsa Grana, PA-C  traZODone (DESYREL) 100 MG tablet Take 1 tablet (100 mg total) by mouth at bedtime. 04/04/18  Yes Rainey Pines, MD  ziprasidone (GEODON) 20 MG capsule Take 1 capsule (20 mg total) by mouth daily with breakfast. 10/12/18  Yes Eappen, Ria Clock, MD  ziprasidone (GEODON) 80 MG capsule Take 1 capsule (80 mg total) by mouth daily with supper. 10/12/18  Yes Ursula Alert, MD     Allergies  Allergen Reactions  . Benadryl [Diphenhydramine Hcl]     Hives/ rapid heartrate  . Codeine   . Vicodin [Hydrocodone-Acetaminophen] Other (See Comments)    dizziness     Family History  Adopted: Yes  Problem Relation Age of Onset  . COPD Mother   . Depression Mother   . Colon cancer Mother        unsure of age  . Cancer Father        Lung CA, Skin Cancer--?type  . COPD Father   . Stroke Father   . Cancer Sister 75       cervical cancer  . COPD Brother   . Alcohol abuse Brother   . Stroke Brother   . COPD Maternal Aunt   . Depression Daughter   . Schizophrenia Son   . Breast cancer Neg Hx      Social History   Socioeconomic History  . Marital status: Married    Spouse name: Not on file  . Number of children: Not on file  . Years of education: Not on file  . Highest education level: Not on file  Occupational History  . Not on file  Social Needs  . Financial resource strain: Not on file  . Food insecurity:    Worry: Not on file    Inability: Not on file  . Transportation needs:    Medical: Not on  file    Non-medical: Not on file  Tobacco Use  . Smoking status: Former Smoker    Packs/day: 1.00    Years: 30.00    Pack years: 30.00    Types: Cigarettes    Last attempt to quit: 04/08/2009    Years since quitting: 9.5  . Smokeless tobacco: Never Used  Substance and Sexual Activity  . Alcohol use: Yes    Alcohol/week: 3.0 - 7.0 standard drinks    Types: 3 - 7 Shots of liquor per week    Comment: occasionally  . Drug use: Yes  Types: Marijuana    Comment: told to refrain until after surgery  . Sexual activity: Yes    Birth control/protection: Surgical  Lifestyle  . Physical activity:    Days per week: Not on file    Minutes per session: Not on file  . Stress: Not on file  Relationships  . Social connections:    Talks on phone: Not on file    Gets together: Not on file    Attends religious service: Not on file    Active member of club or organization: Not on file    Attends meetings of clubs or organizations: Not on file    Relationship status: Not on file  . Intimate partner violence:    Fear of current or ex partner: Not on file    Emotionally abused: Not on file    Physically abused: Not on file    Forced sexual activity: Not on file  Other Topics Concern  . Not on file  Social History Narrative  . Not on file     Review of Systems  Constitutional: Negative.   HENT: Negative.   Eyes: Negative.   Respiratory: Negative.   Cardiovascular: Negative.   Gastrointestinal: Negative.   Endocrine: Negative.   Genitourinary: Negative.   Musculoskeletal: Negative.   Skin: Negative.   Allergic/Immunologic: Negative.   Neurological: Negative.   Hematological: Negative.   Psychiatric/Behavioral: Negative.  Negative for self-injury and suicidal ideas.  All other systems reviewed and are negative.      Objective:    Vitals:   10/21/18 1113  BP: 118/64  Pulse: 64  Resp: 14  Temp: 98.6 F (37 C)  TempSrc: Oral  SpO2: 95%  Weight: 199 lb (90.3 kg)    Height: 5\' 4"  (1.626 m)      Physical Exam Vitals signs and nursing note reviewed.  Constitutional:      General: She is not in acute distress.    Appearance: She is well-developed. She is obese. She is not ill-appearing, toxic-appearing or diaphoretic.  HENT:     Head: Normocephalic and atraumatic.     Nose: Rhinorrhea present.     Mouth/Throat:     Mouth: Mucous membranes are dry.     Pharynx: No posterior oropharyngeal erythema.  Eyes:     General: No scleral icterus.       Right eye: No discharge.        Left eye: No discharge.     Conjunctiva/sclera: Conjunctivae normal.  Neck:     Trachea: No tracheal deviation.  Cardiovascular:     Rate and Rhythm: Normal rate and regular rhythm.     Pulses: Normal pulses.  Pulmonary:     Effort: Pulmonary effort is normal. No respiratory distress.     Breath sounds: No stridor.  Musculoskeletal: Normal range of motion.  Skin:    General: Skin is warm and dry.     Findings: No rash.  Neurological:     Mental Status: She is alert.     Motor: No abnormal muscle tone.     Coordination: Coordination normal.  Psychiatric:        Attention and Perception: Attention normal.        Mood and Affect: Mood normal.        Behavior: Behavior normal. Behavior is cooperative.        Thought Content: Thought content does not include homicidal or suicidal ideation. Thought content does not include homicidal or suicidal plan.  Cognition and Memory: Cognition normal.      EKG: NSR, rate 69, normal axis, QTc 465, no ST elevation or depression     Assessment & Plan:      ICD-10-CM   1. Medication monitoring encounter Z51.81 TSH    CBC with Differential/Platelet    COMPLETE METABOLIC PANEL WITH GFR    Lithium level    CANCELED: BASIC METABOLIC PANEL WITH GFR  2. At risk for long QT syndrome Z91.89 TSH    CBC with Differential/Platelet    COMPLETE METABOLIC PANEL WITH GFR    CANCELED: BASIC METABOLIC PANEL WITH GFR  3. Bipolar  disorder, current episode mixed, moderate (HCC) F31.62 TSH    CBC with Differential/Platelet    COMPLETE METABOLIC PANEL WITH GFR    CANCELED: BASIC METABOLIC PANEL WITH GFR  4. Abnormal LFTs G64.4 COMPLETE METABOLIC PANEL WITH GFR  5. Encounter for lithium monitoring Z51.81 Lithium level   Z79.899     Pt stated that she did not think she would go get her labs done - I attempted to get labs done here while she was in clinic and then I also ordered lithium level with CC to her psychiatrist at a [facility] facility because she did not seem like she would go find a location and wait at a [facility] location -even though I did look up locations with her near where she lived and wrote down addresses and phone numbers for her.  She also did not seem to understand the written directions that she had brought with her on the lab slip, it was as if she never read it or seen it before.  It stated that she was to get a lithium level drawn in the morning before taking her morning dose of lithium and she was surprised by this.  She also did not know that it is had to have the labs done within 1 week and per conversation she also did not seem to know or be aware that she supposed to follow-up with her psychiatrist and 10 days or less.  EKG shows QTcB of 465 -high end of normal but under 480, will need to use caution with medications prescribed make sure nothing else prolonged QTC.  I have added this diagnosis to her chart.    With lithium tx and management, pt was also cautioned to not use NSAIDs and to stop Mobic due to med interaction risk of elevated serum lithium levels, therapeutic index and range was explained to the pt and she was urged to get lithium level checked tomorrow morning to ensure her levels are safe.    Because I have interfered in labs ordered by psychiatry - I am routing them to her, but I feared the pt would be non-compliant based on what she said during the visit, and her seeming lack of interest in  getting labs done, and paying attention to the written instructions by her Psychiatrist.     Delsa Grana, PA-C 10/21/18 11:47 AM

## 2018-10-22 LAB — CBC WITH DIFFERENTIAL/PLATELET
Absolute Monocytes: 429 cells/uL (ref 200–950)
BASOS ABS: 39 {cells}/uL (ref 0–200)
Basophils Relative: 0.5 %
EOS ABS: 94 {cells}/uL (ref 15–500)
Eosinophils Relative: 1.2 %
HCT: 35.9 % (ref 35.0–45.0)
HEMOGLOBIN: 12.1 g/dL (ref 11.7–15.5)
Lymphs Abs: 2473 cells/uL (ref 850–3900)
MCH: 29.8 pg (ref 27.0–33.0)
MCHC: 33.7 g/dL (ref 32.0–36.0)
MCV: 88.4 fL (ref 80.0–100.0)
MPV: 9.8 fL (ref 7.5–12.5)
Monocytes Relative: 5.5 %
NEUTROS ABS: 4766 {cells}/uL (ref 1500–7800)
NEUTROS PCT: 61.1 %
Platelets: 204 10*3/uL (ref 140–400)
RBC: 4.06 10*6/uL (ref 3.80–5.10)
RDW: 12.6 % (ref 11.0–15.0)
Total Lymphocyte: 31.7 %
WBC: 7.8 10*3/uL (ref 3.8–10.8)

## 2018-10-22 LAB — COMPLETE METABOLIC PANEL WITH GFR
AG RATIO: 2.1 (calc) (ref 1.0–2.5)
ALT: 44 U/L — AB (ref 6–29)
AST: 35 U/L (ref 10–35)
Albumin: 4.5 g/dL (ref 3.6–5.1)
Alkaline phosphatase (APISO): 127 U/L (ref 33–130)
BILIRUBIN TOTAL: 0.3 mg/dL (ref 0.2–1.2)
BUN: 12 mg/dL (ref 7–25)
CALCIUM: 9.2 mg/dL (ref 8.6–10.4)
CHLORIDE: 104 mmol/L (ref 98–110)
CO2: 24 mmol/L (ref 20–32)
Creat: 0.88 mg/dL (ref 0.50–1.05)
GFR, Est African American: 87 mL/min/{1.73_m2} (ref >=60)
GFR, Est Non African American: 75 mL/min/{1.73_m2} (ref >=60)
GLUCOSE: 96 mg/dL (ref 65–99)
Globulin: 2.1 g/dL (calc) (ref 1.9–3.7)
POTASSIUM: 4.1 mmol/L (ref 3.5–5.3)
Sodium: 139 mmol/L (ref 135–146)
Total Protein: 6.6 g/dL (ref 6.1–8.1)

## 2018-10-22 LAB — TSH: TSH: 2 mIU/L

## 2018-10-22 LAB — SPECIMEN COMPROMISED

## 2018-10-24 ENCOUNTER — Other Ambulatory Visit: Payer: Self-pay

## 2018-10-24 ENCOUNTER — Other Ambulatory Visit: Payer: Self-pay | Admitting: Psychiatry

## 2018-10-24 ENCOUNTER — Ambulatory Visit: Payer: Medicare PPO | Admitting: Psychiatry

## 2018-10-24 ENCOUNTER — Encounter: Payer: Self-pay | Admitting: Psychiatry

## 2018-10-24 VITALS — BP 121/80 | HR 79 | Temp 97.4°F | Wt 198.6 lb

## 2018-10-24 DIAGNOSIS — F3162 Bipolar disorder, current episode mixed, moderate: Secondary | ICD-10-CM | POA: Diagnosis not present

## 2018-10-24 DIAGNOSIS — F411 Generalized anxiety disorder: Secondary | ICD-10-CM | POA: Diagnosis not present

## 2018-10-24 MED ORDER — BENZTROPINE MESYLATE 1 MG PO TABS: 1.0000 mg | ORAL_TABLET | Freq: Two times a day (BID) | ORAL | 1 refills | Status: DC

## 2018-10-24 MED ORDER — ZIPRASIDONE HCL 60 MG PO CAPS: 60.0000 mg | ORAL_CAPSULE | Freq: Two times a day (BID) | ORAL | 1 refills | Status: DC

## 2018-10-24 MED ORDER — HYDROXYZINE HCL 25 MG PO TABS
25.0000 mg | ORAL_TABLET | Freq: Three times a day (TID) | ORAL | 1 refills | Status: DC | PRN
Start: 2018-10-24 — End: 2019-03-21

## 2018-10-24 MED ORDER — TRAZODONE HCL 100 MG PO TABS: 100.0000 mg | ORAL_TABLET | Freq: Every day | ORAL | 1 refills | Status: DC

## 2018-10-24 NOTE — Telephone Encounter (Signed)
Patient seen on 10/21/18 

## 2018-10-24 NOTE — Progress Notes (Signed)
[place] MD OP Progress Note  10/24/2018 12:43 PM Stacy Chambers  MRN:  [id number]  Chief Complaint: ' I am here for follow up." Chief Complaint    Follow-up; Medication Refill     HPI: Haillie is a 52 year old Caucasian female, married, lives in [place], has a history of bipolar disorder, anxiety disorder, abnormal liver function test, COPD, chronic back pain, RLS, presented to the clinic today for a follow-up visit.  Patient used to follow-up with Dr. Gretel Acre here in clinic and this is her second visit with writer.  Patient reports she is compliant with her Geodon as well as lithium as prescribed.  She however had a meltdown last Saturday.  She reports she continues to have relationship struggles with her husband.  Patient was referred for psychotherapy last visit however reports she cannot afford the co-pay.  Hence discussed referring her to a community clinic and gave her the information for [facility].  Discussed with patient importance of having psychotherapy along with medication management since she is struggling with a lot of relationship struggles and situational stressors.  Patient reports sleep is improved.  She continues to be taking the trazodone on a regular basis.  Patient does report some recent auditory hallucinations-reports she is hearing some noises and cannot make out what they are.  She reports they are recent.  Discussed readjusting the dosage of Geodon and she agrees with plan.  Patient does report some cramps at night however does not know if this is due to side effect of Geodon versus her history of RLS.  However will increase the Cogentin and she agrees with plan.  I have discussed most recent labs with patient.  She also had EKG done on 10/21/2018-discussed with her-within normal limits. Visit Diagnosis:    ICD-10-CM   1. Bipolar 1 disorder, mixed, moderate (HCC) F31.62 ziprasidone (GEODON) 60 MG capsule    benztropine (COGENTIN) 1 MG tablet    traZODone (DESYREL) 100 MG  tablet  2. GAD (generalized anxiety disorder) F41.1 hydrOXYzine (ATARAX/VISTARIL) 25 MG tablet    Past Psychiatric History: I have reviewed past psychiatric history from my progress note on 10/12/2018.  Past Medical History:  Past Medical History:  Diagnosis Date  . Abnormal blood chemistry 10/03/2015  . Anginal pain ([place])   . Anxiety   . Anxiety, generalized 02/21/2015  . Bipolar affective disorder ([place]) 03/08/2013   Overview:  Last Assessment & Plan:  Continue meds, f/u with psych Restart benzo at 1 po BID prn,    . Bipolar disorder ([place])   . 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 Chronic obstructive pulmonary disease ([place]) 10/03/2015  . COPD (chronic obstructive pulmonary disease) (HCC)    no inhalers  . Elevated liver function tests 11/26/2004   H/O NEGATIVE LIVER BIOPSY, workup negative  . Gallstones 02/21/2015  . GERD (gastroesophageal reflux disease)   . Hyperlipidemia   . Insomnia   . Lower back pain   . Osteoarthritis   . Ovarian cancer ([place]) 1995  . Restless legs syndrome (RLS)   . RLS (restless legs syndrome)   . Sedative, hypnotic or anxiolytic dependence with withdrawal, 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[place])   . Wears dentures    full upper and lower    Past Surgical History:  Procedure Laterality Date  . ABDOMINAL HYSTERECTOMY    . APPENDECTOMY    . BREAST BIOPSY Left    neg- core  . CESAREAN SECTION    . COLONOSCOPY WITH PROPOFOL N/A 05/30/2018   Procedure: COLONOSCOPY WITH  PROPOFOL;  Surgeon: Manya Silvas, MD;  Location: [facility] ENDOSCOPY;  Service: Endoscopy;  Laterality: N/A;  . EXCISION MORTON'S NEUROMA Left 10/07/2016   Procedure: EXCISION MORTON'S NEUROMA;  Surgeon: [name], DPM;  Location: [facility];  Service: Podiatry;  Laterality: Left;  IV WITH LOCAL  . LIVER BIOPSY  2005   PerPt: Done to eval elevated LFTs and biopsy provided no definite dx/cause of elevated LFTs  . MULTIPLE TOOTH EXTRACTIONS    . OOPHORECTOMY      Family Psychiatric History: Reviewed  family psychiatric history from my progress note on 10/12/2018.  Family History:  Family History  Adopted: Yes  Problem Relation Age of Onset  . COPD Mother   . Depression Mother   . Colon cancer Mother        unsure of age  . Cancer Father        Lung CA, Skin Cancer--?type  . COPD Father   . Stroke Father   . Cancer Sister 29       cervical cancer  . COPD Brother   . Alcohol abuse Brother   . Stroke Brother   . COPD Maternal Aunt   . Depression Daughter   . Schizophrenia Son   . Breast cancer Neg Hx     Social History: Reviewed social history from my progress note on 10/12/2018. Social History   Socioeconomic History  . Marital status: Married    Spouse name: Not on file  . Number of children: Not on file  . Years of education: Not on file  . Highest education level: Not on file  Occupational History  . Not on file  Social Needs  . Financial resource strain: Not on file  . Food insecurity:    Worry: Not on file    Inability: Not on file  . Transportation needs:    Medical: Not on file    Non-medical: Not on file  Tobacco Use  . Smoking status: Former Smoker    Packs/day: 1.00    Years: 30.00    Pack years: 30.00    Types: Cigarettes    Last attempt to quit: 04/08/2009    Years since quitting: 9.5  . Smokeless tobacco: Never Used  Substance and Sexual Activity  . Alcohol use: Yes    Alcohol/week: 3.0 - 7.0 standard drinks    Types: 3 - 7 Shots of liquor per week    Comment: occasionally  . Drug use: Yes    Types: Marijuana    Comment: told to refrain until after surgery  . Sexual activity: Yes    Birth control/protection: Surgical  Lifestyle  . Physical activity:    Days per week: Not on file    Minutes per session: Not on file  . Stress: Not on file  Relationships  . Social connections:    Talks on phone: Not on file    Gets together: Not on file    Attends religious service: Not on file    Active member of club or organization: Not on file     Attends meetings of clubs or organizations: Not on file    Relationship status: Not on file  Other Topics Concern  . Not on file  Social History Narrative  . Not on file    Allergies:  Allergies  Allergen Reactions  . Benadryl [Diphenhydramine Hcl]     Hives/ rapid heartrate  . Codeine   . Vicodin [Hydrocodone-Acetaminophen] Other (See Comments)    dizziness  Metabolic Disorder Labs: Lab Results  Component Value Date   HGBA1C 5.6 09/14/2018   MPG 114 09/14/2018   No results found for: PROLACTIN Lab Results  Component Value Date   CHOL 169 09/14/2018   TRIG 263 (H) 09/14/2018   HDL 50 (L) 09/14/2018   CHOLHDL 3.4 09/14/2018   VLDL 18 03/04/2015   LDLCALC 84 09/14/2018   LDLCALC 87 03/04/2015   Lab Results  Component Value Date   TSH 2.00 10/21/2018   TSH 1.514 10/08/2014    Therapeutic Level Labs: Lab Results  Component Value Date   LITHIUM 0.49 (L) 01/13/2009   No results found for: VALPROATE No components found for:  CBMZ  Current Medications: Current Outpatient Medications  Medication Sig Dispense Refill  . alendronate (FOSAMAX) 70 MG tablet Take 1 tablet (70 mg total) by mouth once a week. Take with a full glass of water on an empty stomach. 12 tablet 3  . ammonium lactate (AMLACTIN) 12 % cream Apply topically as needed for dry skin.    . clonazePAM (KLONOPIN) 0.5 MG tablet Take 1 tablet (0.5 mg total) by mouth daily as needed for anxiety. 20 tablet 0  . esomeprazole (NEXIUM) 10 MG packet Take 10 mg by mouth daily as needed.    . hydrOXYzine (ATARAX/VISTARIL) 25 MG tablet Take 1 tablet (25 mg total) by mouth 3 (three) times daily as needed. For severe anxiety sx 270 tablet 1  . lithium 300 MG tablet Take 1 tablet (300 mg total) by mouth 2 (two) times daily. 60 tablet 1  . lubiprostone (AMITIZA) 24 MCG capsule Take by mouth.    . meloxicam (MOBIC) 7.5 MG tablet Take 1 tablet (7.5 mg total) by mouth daily. 30 tablet 6  . rosuvastatin (CRESTOR) 10 MG  tablet Take 1 tablet (10 mg total) by mouth daily. 90 tablet 0  . traZODone (DESYREL) 100 MG tablet Take 1 tablet (100 mg total) by mouth at bedtime. 90 tablet 1  . benztropine (COGENTIN) 1 MG tablet Take 1 tablet (1 mg total) by mouth 2 (two) times daily. For side effects of geodon 60 tablet 1  . ziprasidone (GEODON) 60 MG capsule Take 1 capsule (60 mg total) by mouth 2 (two) times daily with a meal. 180 capsule 1   No current facility-administered medications for this visit.      Musculoskeletal: Strength & Muscle Tone: within normal limits Gait & Station: normal Patient leans: N/A  Psychiatric Specialty Exam: Review of Systems  Psychiatric/Behavioral: Positive for hallucinations. The patient is nervous/anxious.   All other systems reviewed and are negative.   Blood pressure 121/80, pulse 79, temperature (!) 97.4 F (36.3 C), temperature source Oral, weight 198 lb 9.6 oz (90.1 kg).Body mass index is 34.09 kg/m.  General Appearance: Casual  Eye Contact:  Fair  Speech:  Clear and Coherent  Volume:  Normal  Mood:  Anxious  Affect:  Appropriate  Thought Process:  Goal Directed and Descriptions of Associations: Intact  Orientation:  Full (Time, Place, and Person)  Thought Content: Hallucinations: Auditory   Suicidal Thoughts:  No  Homicidal Thoughts:  No  Memory:  Immediate;   Fair Recent;   Fair Remote;   Fair  Judgement:  Fair  Insight:  Fair  Psychomotor Activity:  Normal  Concentration:  Concentration: Fair and Attention Span: Fair  Recall:  [employer] of Knowledge: Fair  Language: Fair  Akathisia:  No  Handed:  Right  AIMS (if indicated): 5, patient reports some cramps  at night in her lower extremities.  Currently denies it.  Assets:  Communication Skills Desire for Improvement Social Support  ADL's:  Intact  Cognition: WNL  Sleep:  improved   Screenings: AIMS     Office Visit from 10/24/2018 in [place] Office Visit from  04/04/2018 in [place] Office Visit from 01/03/2018 in [place] Office Visit from 11/18/2016 in [place] Total Score  5  0  0  0    PHQ2-9     Office Visit from 10/21/2018 in [place] Visit from 09/29/2018 in [place] Office Visit from 09/14/2018 in [place] Office Visit from 04/27/2018 in [place]  PHQ-2 Total Score  4  4  5  5   PHQ-9 Total Score  21  21  19  20        Assessment and Plan: Cherina is a 52 year old Caucasian female, married, lives in [place], has a history of bipolar disorder, anxiety disorder, COPD, RLS, elevated liver enzymes, presented to the clinic today for a follow-up visit.  Patient with psychosocial stressors of the death of her son, relationship struggles with her husband and other children.  Patient is biologically predisposed given her family history of mental health problems.  Patient is currently making some progress on the current medication regimen.  However she struggles with continued relationship struggles and will benefit from psychotherapy to which she was referred to last visit however she is noncompliant with the same due to financial stressors.  Discussed the following plan with patient.  Plan Bipolar disorder Increase Geodon to 60 mg p.o. daily in divided dosage twice a day with meals. Increase Cogentin to 1 mg p.o. twice daily for EPS Lithium 300 mg p.o. twice daily. Lithium levels pending-she reports she went to the lab this morning.  Generalized anxiety disorder Continue Klonopin 0.5 mg daily as needed Change hydroxyzine to 25 mg p.o. 3 times daily as needed.  For insomnia Trazodone 100 mg p.o. nightly  I have reviewed the following labs- dated 10/21/2018-TSH-within normal limits, CBC with differential-within normal limits, ALT-44-elevated.   I have reviewed  EKG-10/21/2018- QTC within normal limits.  Provided patient information for [facility] for psychotherapy.  Also provided information for therapist here in clinic.  Patient will benefit from psychotherapy sessions.  Follow-up in clinic in 4 weeks or sooner if needed.  More than 50 % of the time was spent for psychoeducation and supportive psychotherapy and care coordination.  This note was generated in part or whole with voice recognition software. Voice recognition is usually quite accurate but there are transcription errors that can and very often do occur. I apologize for any typographical errors that were not detected and corrected.       Ursula Alert, MD 10/24/2018, 12:43 PM

## 2018-10-25 ENCOUNTER — Ambulatory Visit: Payer: Medicare PPO

## 2018-10-25 LAB — BASIC METABOLIC PANEL
BUN/Creatinine Ratio: 13 (ref 9–23)
BUN: 10 mg/dL (ref 6–24)
CALCIUM: 10.3 mg/dL — AB (ref 8.7–10.2)
CO2: 25 mmol/L (ref 20–29)
CREATININE: 0.8 mg/dL (ref 0.57–1.00)
Chloride: 100 mmol/L (ref 96–106)
GFR calc Af Amer: 97 mL/min/{1.73_m2} (ref >59)
GFR, EST NON AFRICAN AMERICAN: 84 mL/min/{1.73_m2} (ref >59)
Glucose: 129 mg/dL — ABNORMAL HIGH (ref 65–99)
POTASSIUM: 4 mmol/L (ref 3.5–5.2)
Sodium: 141 mmol/L (ref 134–144)

## 2018-10-25 LAB — CBC WITH DIFFERENTIAL/PLATELET
BASOS: 1 %
Basophils Absolute: 0.1 10*3/uL (ref 0.0–0.2)
EOS (ABSOLUTE): 0.1 10*3/uL (ref 0.0–0.4)
Eos: 2 %
HEMATOCRIT: 36.1 % (ref 34.0–46.6)
HEMOGLOBIN: 12.6 g/dL (ref 11.1–15.9)
IMMATURE GRANS (ABS): 0 10*3/uL (ref 0.0–0.1)
IMMATURE GRANULOCYTES: 1 %
Lymphocytes Absolute: 2.5 10*3/uL (ref 0.7–3.1)
Lymphs: 31 %
MCH: 30.4 pg (ref 26.6–33.0)
MCHC: 34.9 g/dL (ref 31.5–35.7)
MCV: 87 fL (ref 79–97)
MONOCYTES: 5 %
Monocytes Absolute: 0.4 10*3/uL (ref 0.1–0.9)
NEUTROS PCT: 60 %
Neutrophils Absolute: 4.9 10*3/uL (ref 1.4–7.0)
Platelets: 228 10*3/uL (ref 150–450)
RBC: 4.15 x10E6/uL (ref 3.77–5.28)
RDW: 12.6 % (ref 12.3–15.4)
WBC: 8.1 10*3/uL (ref 3.4–10.8)

## 2018-10-25 LAB — LITHIUM LEVEL: Lithium Lvl: 0.3 mmol/L — ABNORMAL LOW (ref 0.6–1.2)

## 2018-10-25 LAB — TSH: TSH: 2.32 u[IU]/mL (ref 0.450–4.500)

## 2018-10-25 NOTE — Progress Notes (Signed)
Great thank you!

## 2018-11-01 ENCOUNTER — Ambulatory Visit: Payer: Medicare PPO | Admitting: Licensed Clinical Social Worker

## 2018-11-03 ENCOUNTER — Ambulatory Visit
Admission: RE | Admit: 2018-11-03 | Discharge: 2018-11-03 | Disposition: A | Discharge status: Home / Self Care | Payer: Medicare HMO | Source: Ambulatory Visit | Attending: Family Medicine | Admitting: Family Medicine

## 2018-11-03 DIAGNOSIS — M810 Age-related osteoporosis without current pathological fracture: Secondary | ICD-10-CM | POA: Diagnosis present

## 2018-11-03 DIAGNOSIS — Z780 Asymptomatic menopausal state: Secondary | ICD-10-CM | POA: Diagnosis not present

## 2018-11-03 DIAGNOSIS — Z1231 Encounter for screening mammogram for malignant neoplasm of breast: Secondary | ICD-10-CM | POA: Diagnosis not present

## 2018-11-03 DIAGNOSIS — M8588 Other specified disorders of bone density and structure, other site: Secondary | ICD-10-CM | POA: Diagnosis not present

## 2018-11-03 DIAGNOSIS — M8589 Other specified disorders of bone density and structure, multiple sites: Secondary | ICD-10-CM | POA: Diagnosis not present

## 2018-11-03 DIAGNOSIS — Z1239 Encounter for other screening for malignant neoplasm of breast: Secondary | ICD-10-CM | POA: Diagnosis present

## 2018-11-03 NOTE — Addendum Note (Signed)
Addended by: Delsa Grana on: 11/03/2018 01:05 PM   Modules accepted: Orders

## 2018-11-07 ENCOUNTER — Ambulatory Visit: Payer: Medicare PPO | Admitting: Psychiatry

## 2018-11-09 ENCOUNTER — Encounter: Payer: Self-pay | Admitting: Family Medicine

## 2018-11-11 ENCOUNTER — Ambulatory Visit
Admission: RE | Admit: 2018-11-11 | Discharge: 2018-11-11 | Disposition: A | Discharge status: Home / Self Care | Payer: Medicare HMO | Source: Ambulatory Visit | Attending: Family Medicine | Admitting: Family Medicine

## 2018-11-11 DIAGNOSIS — R922 Inconclusive mammogram: Secondary | ICD-10-CM | POA: Diagnosis not present

## 2018-11-11 DIAGNOSIS — Z1239 Encounter for other screening for malignant neoplasm of breast: Secondary | ICD-10-CM | POA: Diagnosis not present

## 2018-11-11 DIAGNOSIS — N6312 Unspecified lump in the right breast, upper inner quadrant: Secondary | ICD-10-CM | POA: Insufficient documentation

## 2018-11-11 DIAGNOSIS — N6310 Unspecified lump in the right breast, unspecified quadrant: Secondary | ICD-10-CM | POA: Diagnosis not present

## 2018-11-11 DIAGNOSIS — N6011 Diffuse cystic mastopathy of right breast: Secondary | ICD-10-CM | POA: Diagnosis not present

## 2018-11-14 ENCOUNTER — Other Ambulatory Visit: Payer: Self-pay | Admitting: Family Medicine

## 2018-11-14 DIAGNOSIS — N6310 Unspecified lump in the right breast, unspecified quadrant: Secondary | ICD-10-CM

## 2018-11-21 ENCOUNTER — Encounter: Payer: Self-pay | Admitting: Psychiatry

## 2018-11-21 ENCOUNTER — Other Ambulatory Visit: Payer: Self-pay

## 2018-11-21 ENCOUNTER — Ambulatory Visit: Payer: Medicare PPO | Admitting: Psychiatry

## 2018-11-21 VITALS — BP 119/80 | HR 79 | Temp 97.9°F | Wt 200.2 lb

## 2018-11-21 DIAGNOSIS — G2571 Drug induced akathisia: Secondary | ICD-10-CM | POA: Diagnosis not present

## 2018-11-21 DIAGNOSIS — F411 Generalized anxiety disorder: Secondary | ICD-10-CM

## 2018-11-21 DIAGNOSIS — F3162 Bipolar disorder, current episode mixed, moderate: Secondary | ICD-10-CM | POA: Diagnosis not present

## 2018-11-21 DIAGNOSIS — T43505A Adverse effect of unspecified antipsychotics and neuroleptics, initial encounter: Secondary | ICD-10-CM | POA: Diagnosis not present

## 2018-11-21 DIAGNOSIS — F5105 Insomnia due to other mental disorder: Secondary | ICD-10-CM

## 2018-11-21 MED ORDER — BENZTROPINE MESYLATE 1 MG PO TABS: 1.0000 mg | ORAL_TABLET | Freq: Three times a day (TID) | ORAL | 1 refills | Status: DC

## 2018-11-21 NOTE — Progress Notes (Signed)
[place] MD OP Progress Note  11/21/2018 1:02 PM Stacy Chambers  MRN:  [id number]  Chief Complaint: ' I am here for follow up." Chief Complaint    Follow-up; Medication Refill     HPI: Stacy Chambers is a 53 year old Caucasian female, married, lives in [place], history of bipolar disorder, anxiety disorder, abnormal liver function test, COPD, chronic back pain, RLS, presented to clinic today for a follow-up visit.  Patient today reports she has been compliant with her medications as prescribed.  She reports improvement in her mood symptoms.  Patient however reports she has noticed some possible side effects to the medications.  She reports she has been slurring on her speech since the past few weeks.  She also reports restlessness of her feet.  She does have a history of RLS.  Discussed with her that the restlessness in her feet could be a side effect of her antipsychotic medication the Geodon.  She is already taking Cogentin.  Discussed readjusting her dosage and reevaluating it.  I have reviewed labs in [facility] most recently reported.  Reviewed TSH, lithium level, BMP and CBC with patient.  Also reviewed EKG completed.  Patient denies any suicidality.  Patient denies any perceptual disturbances. Visit Diagnosis:    ICD-10-CM   1. Bipolar 1 disorder, mixed, moderate (HCC) F31.62 benztropine (COGENTIN) 1 MG tablet  2. GAD (generalized anxiety disorder) F41.1   3. Acute neuroleptic-induced akathisia G25.71    T43.505A   4. Insomnia due to mental condition F51.05     Past Psychiatric History: Reviewed past psychiatric history from my progress note on 10/12/2018.  Past Medical History:  Past Medical History:  Diagnosis Date  . Abnormal blood chemistry 10/03/2015  . Anginal pain ([place])   . Anxiety   . Anxiety, generalized 02/21/2015  . Bipolar affective disorder ([place]) 03/08/2013   Overview:  Last Assessment & Plan:  Continue meds, f/u with psych Restart benzo at 1 po BID prn,    . Bipolar disorder  ([place])   . 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 Chronic obstructive pulmonary disease ([place]) 10/03/2015  . COPD (chronic obstructive pulmonary disease) (HCC)    no inhalers  . Elevated liver function tests 11/26/2004   H/O NEGATIVE LIVER BIOPSY, workup negative  . Gallstones 02/21/2015  . GERD (gastroesophageal reflux disease)   . Hyperlipidemia   . Insomnia   . Lower back pain   . Osteoarthritis   . Ovarian cancer ([place]) 1995  . Restless legs syndrome (RLS)   . RLS (restless legs syndrome)   . Sedative, hypnotic or anxiolytic dependence with withdrawal, 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[place])   . Wears dentures    full upper and lower    Past Surgical History:  Procedure Laterality Date  . ABDOMINAL HYSTERECTOMY    . APPENDECTOMY    . BREAST BIOPSY Left    neg- core  . CESAREAN SECTION    . COLONOSCOPY WITH PROPOFOL N/A 05/30/2018   Procedure: COLONOSCOPY WITH PROPOFOL;  Surgeon: Manya Silvas, MD;  Location: [facility] ENDOSCOPY;  Service: Endoscopy;  Laterality: N/A;  . EXCISION MORTON'S NEUROMA Left 10/07/2016   Procedure: EXCISION MORTON'S NEUROMA;  Surgeon: [name], DPM;  Location: [place];  Service: Podiatry;  Laterality: Left;  IV WITH LOCAL  . LIVER BIOPSY  2005   PerPt: Done to eval elevated LFTs and biopsy provided no definite dx/cause of elevated LFTs  . MULTIPLE TOOTH EXTRACTIONS    . OOPHORECTOMY      Family Psychiatric History:  Reviewed family psychiatric history from my progress on 10/12/2018.  Family History:  Family History  Adopted: Yes  Problem Relation Age of Onset  . COPD Mother   . Depression Mother   . Colon cancer Mother        unsure of age  . Cancer Father        Lung CA, Skin Cancer--?type  . COPD Father   . Stroke Father   . Cancer Sister 27       cervical cancer  . COPD Brother   . Alcohol abuse Brother   . Stroke Brother   . COPD Maternal Aunt   . Depression Daughter   . Schizophrenia Son   . Breast cancer Neg Hx     Social History: Reviewed  social history from my progress note on 10/12/2018. Social History   Socioeconomic History  . Marital status: Married    Spouse name: Not on file  . Number of children: Not on file  . Years of education: Not on file  . Highest education level: Not on file  Occupational History  . Not on file  Social Needs  . Financial resource strain: Not on file  . Food insecurity:    Worry: Not on file    Inability: Not on file  . Transportation needs:    Medical: Not on file    Non-medical: Not on file  Tobacco Use  . Smoking status: Former Smoker    Packs/day: 1.00    Years: 30.00    Pack years: 30.00    Types: Cigarettes    Last attempt to quit: 04/08/2009    Years since quitting: 9.6  . Smokeless tobacco: Never Used  Substance and Sexual Activity  . Alcohol use: Yes    Alcohol/week: 3.0 - 7.0 standard drinks    Types: 3 - 7 Shots of liquor per week    Comment: occasionally  . Drug use: Yes    Types: Marijuana    Comment: told to refrain until after surgery  . Sexual activity: Yes    Birth control/protection: Surgical  Lifestyle  . Physical activity:    Days per week: Not on file    Minutes per session: Not on file  . Stress: Not on file  Relationships  . Social connections:    Talks on phone: Not on file    Gets together: Not on file    Attends religious service: Not on file    Active member of club or organization: Not on file    Attends meetings of clubs or organizations: Not on file    Relationship status: Not on file  Other Topics Concern  . Not on file  Social History Narrative  . Not on file    Allergies:  Allergies  Allergen Reactions  . Benadryl [Diphenhydramine Hcl]     Hives/ rapid heartrate  . Codeine   . Vicodin [Hydrocodone-Acetaminophen] Other (See Comments)    dizziness    Metabolic Disorder Labs: Lab Results  Component Value Date   HGBA1C 5.6 09/14/2018   MPG 114 09/14/2018   No results found for: PROLACTIN Lab Results  Component Value  Date   CHOL 169 09/14/2018   TRIG 263 (H) 09/14/2018   HDL 50 (L) 09/14/2018   CHOLHDL 3.4 09/14/2018   VLDL 18 03/04/2015   LDLCALC 84 09/14/2018   LDLCALC 87 03/04/2015   Lab Results  Component Value Date   TSH 2.320 10/24/2018   TSH 2.00 10/21/2018    Therapeutic  Level Labs: Lab Results  Component Value Date   LITHIUM 0.3 (L) 10/24/2018   LITHIUM 0.49 (L) 01/13/2009   No results found for: VALPROATE No components found for:  CBMZ  Current Medications: Current Outpatient Medications  Medication Sig Dispense Refill  . alendronate (FOSAMAX) 70 MG tablet Take 1 tablet (70 mg total) by mouth once a week. Take with a full glass of water on an empty stomach. 12 tablet 3  . ammonium lactate (AMLACTIN) 12 % cream Apply topically as needed for dry skin.    Marland Kitchen benztropine (COGENTIN) 1 MG tablet Take 1 tablet (1 mg total) by mouth 3 (three) times daily. For side effects of geodon 90 tablet 1  . carbamazepine (TEGRETOL) 200 MG tablet     . clonazePAM (KLONOPIN) 0.5 MG tablet Take 1 tablet (0.5 mg total) by mouth daily as needed for anxiety. 20 tablet 0  . esomeprazole (NEXIUM) 10 MG packet Take 10 mg by mouth daily as needed.    . hydrOXYzine (ATARAX/VISTARIL) 25 MG tablet Take 1 tablet (25 mg total) by mouth 3 (three) times daily as needed. For severe anxiety sx 270 tablet 1  . lithium 300 MG tablet Take 1 tablet (300 mg total) by mouth 2 (two) times daily. 60 tablet 1  . lubiprostone (AMITIZA) 24 MCG capsule Take by mouth.    . meloxicam (MOBIC) 7.5 MG tablet Take 1 tablet (7.5 mg total) by mouth daily. 30 tablet 6  . rosuvastatin (CRESTOR) 10 MG tablet Take 1 tablet (10 mg total) by mouth daily. 90 tablet 0  . traZODone (DESYREL) 100 MG tablet Take 1 tablet (100 mg total) by mouth at bedtime. 90 tablet 1  . ziprasidone (GEODON) 60 MG capsule Take 1 capsule (60 mg total) by mouth 2 (two) times daily with a meal. 180 capsule 1   No current facility-administered medications for this  visit.      Musculoskeletal: Strength & Muscle Tone: within normal limits Gait & Station: normal Patient leans: N/A  Psychiatric Specialty Exam: Review of Systems  Psychiatric/Behavioral: Negative for depression.  All other systems reviewed and are negative.   Blood pressure 119/80, pulse 79, temperature 97.9 F (36.6 C), temperature source Oral, weight 200 lb 3.2 oz (90.8 kg).Body mass index is 34.36 kg/m.  General Appearance: Casual  Eye Contact:  Fair  Speech:  Clear and Coherent  Volume:  Normal  Mood:  Euthymic  Affect:  Appropriate  Thought Process:  Goal Directed and Descriptions of Associations: Intact  Orientation:  Full (Time, Place, and Person)  Thought Content: Logical   Suicidal Thoughts:  No  Homicidal Thoughts:  No  Memory:  Immediate;   Fair Recent;   Fair Remote;   Fair  Judgement:  Fair  Insight:  Fair  Psychomotor Activity:  Normal  Concentration:  Concentration: Fair and Attention Span: Fair  Recall:  [employer] of Knowledge: Fair  Language: Fair  Akathisia:  No  Handed:  Right  AIMS (if indicated): has restlessness of her LE - also has hx of RLS  Assets:  Communication Skills Desire for Improvement Housing  ADL's:  Intact  Cognition: WNL  Sleep:  Fair   Screenings: AIMS     Office Visit from 10/24/2018 in [place] Office Visit from 04/04/2018 in [place] Visit from 01/03/2018 in [place] Visit from 11/18/2016 in [facility] Total Score  5  0  0  0  PHQ2-9     Office Visit from 10/21/2018 in [place] Office Visit from 09/29/2018 in [place] Office Visit from 09/14/2018 in [place] Office Visit from 04/27/2018 in [place]  PHQ-2 Total Score  4  4  5  5   PHQ-9 Total Score  21  21  19  20        Assessment and Plan: Kalinda  is a 53 year old Caucasian female, married, lives in [place], has a history of bipolar disorder, anxiety disorder, COPD, RLS, elevated liver enzymes, presented to the clinic today for a follow-up visit.  Patient with psychosocial stressors of the death of her son, relationship struggles with her husband and other children.  She is also biologically predisposed given her family history of mental health problems.  Patient continues to struggle with restless legs syndrome, unknown if this is also due to her antipsychotic medications.  She reports improvement in her mood symptoms.  We will continue to make medication changes.  Plan Bipolar disorder-improving Geodon 60 mg p.o. twice daily in divided dosage with meals. Increase Cogentin to 1 mg p.o. 3 times a day for EPS Lithium 300 mg p.o. twice daily. Lithium levels-reviewed and discussed with patient- noted on 12/30 /2019-0.3-subtherapeutic.  However will not make any medication changes today since her mood symptoms are improving.  Generalized anxiety disorder-improving Klonopin 0.5 mg p.o. as needed only for severe panic attacks. Hydroxyzine 25 mg 3 times a day as needed.   For acute neuroleptic induced akathisia Patient also has a history of RLS, recent restlessness likely induced by her Geodon.  Patient does not want a dose reduction today. Will increase the Cogentin to 1 mg p.o. 3 times daily.  For insomnia-improving Trazodone 100 mg p.o. nightly  I have reviewed the following labs-Li level dated 10/24/2018-0.3-subtherapeutic.  However will not make any changes today since she reports mood symptoms are stable. TSH - wnl Lab Results  Component Value Date   LITHIUM 0.3 (L) 10/24/2018   TSH 2.320 10/24/2018  ]    Follow-up in clinic in 2 weeks or sooner if needed.  I have spent atleast 25 minutes face to face with patient today. More than 50 % of the time was spent for psychoeducation and supportive psychotherapy and care  coordination. This note was generated in part or whole with voice recognition software. Voice recognition is usually quite accurate but there are transcription errors that can and very often do occur. I apologize for any typographical errors that were not detected and corrected.        Ursula Alert, MD 11/21/2018, 1:02 PM

## 2018-12-06 ENCOUNTER — Telehealth: Payer: Self-pay

## 2018-12-06 ENCOUNTER — Telehealth: Payer: Self-pay | Admitting: Psychiatry

## 2018-12-06 DIAGNOSIS — F319 Bipolar disorder, unspecified: Secondary | ICD-10-CM

## 2018-12-06 MED ORDER — LITHIUM CARBONATE 300 MG PO TABS: 300.0000 mg | ORAL_TABLET | Freq: Two times a day (BID) | ORAL | 1 refills | Status: DC

## 2018-12-06 NOTE — Telephone Encounter (Signed)
pt called states that she needs refill on lithium and she is having some memory issues.

## 2018-12-06 NOTE — Telephone Encounter (Signed)
Sent Li to pharmacy - [facility]. Will advise patient to be seen in clinic for memory changes .

## 2018-12-22 ENCOUNTER — Other Ambulatory Visit: Payer: Self-pay

## 2018-12-22 ENCOUNTER — Ambulatory Visit: Payer: Medicare HMO | Admitting: Psychiatry

## 2018-12-22 ENCOUNTER — Encounter: Payer: Self-pay | Admitting: Psychiatry

## 2018-12-22 VITALS — BP 125/81 | HR 80 | Temp 97.5°F | Wt 200.6 lb

## 2018-12-22 DIAGNOSIS — F411 Generalized anxiety disorder: Secondary | ICD-10-CM

## 2018-12-22 DIAGNOSIS — F5105 Insomnia due to other mental disorder: Secondary | ICD-10-CM

## 2018-12-22 DIAGNOSIS — G2571 Drug induced akathisia: Secondary | ICD-10-CM

## 2018-12-22 DIAGNOSIS — R413 Other amnesia: Secondary | ICD-10-CM | POA: Diagnosis not present

## 2018-12-22 DIAGNOSIS — T43505A Adverse effect of unspecified antipsychotics and neuroleptics, initial encounter: Secondary | ICD-10-CM

## 2018-12-22 DIAGNOSIS — F319 Bipolar disorder, unspecified: Secondary | ICD-10-CM | POA: Diagnosis not present

## 2018-12-22 MED ORDER — ZIPRASIDONE HCL 80 MG PO CAPS: 80.0000 mg | ORAL_CAPSULE | Freq: Every day | ORAL | 0 refills | Status: DC

## 2018-12-22 MED ORDER — LAMOTRIGINE 25 MG PO TABS: 25.0000 mg | ORAL_TABLET | Freq: Every day | ORAL | 1 refills | Status: DC

## 2018-12-22 NOTE — Progress Notes (Signed)
**Note Stacy Chambers** [place] MD  OP Progress Note  12/22/2018 11:39 AM DEAISA MERIDA  MRN:  [id number]  Chief Complaint: ' I am here for follow up.' Chief Complaint    Follow-up; Medication Problem     HPI: Domino is a 53 yr old Caucasian female, married, lives in [place], history of bipolar disorder, anxiety disorder, abnormal liver function test, COPD, chronic back pain, RLS, presented to clinic today for a follow-up visit.  Patient today reports she has been struggling with some memory problems.  She attributes it to the lithium.  She reports it started after she was started on the lithium.  She reports she looses her train of thought when she speaks.  This has been going on since the past few weeks and getting worse.  She denies any other symptoms or side effects of lithium at this time.  Patient reports she also has been having some restlessness of her lower extremities ever since being on a higher dosage of Geodon.  She now takes Geodon 120 mg.  Her Cogentin was increased last visit.  She however reports that has not helped.  Discussed reducing the dosage back to 80 mg.  At 80 mg she tolerated the Geodon well previously.  Since patient continues to struggle with anxiety symptoms discussed adding another mood stabilizer like Lamictal.  She agrees with plan.  Provided medication education.   Visit Diagnosis:    ICD-10-CM   1. Bipolar I disorder (HCC) F31.9 lamoTRIgine (LAMICTAL) 25 MG tablet  2. GAD (generalized anxiety disorder) F41.1   3. Acute neuroleptic-induced akathisia G25.71    T43.505A   4. Insomnia due to mental condition F51.05   5. Memory loss R41.3     Past Psychiatric History: Reviewed past psychiatric history from my progress note on 10/12/2018  Past Medical History:  Past Medical History:  Diagnosis Date  . Abnormal blood chemistry 10/03/2015  . Anginal pain ([place])   . Anxiety   . Anxiety, generalized 02/21/2015  . Bipolar affective disorder ([place]) 03/08/2013   Overview:  Last  Assessment & Plan:  Continue meds, f/u with psych Restart benzo at 1 po BID prn,    . Bipolar disorder ([place])   . 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 Chronic obstructive pulmonary disease ([place]) 10/03/2015  . COPD (chronic obstructive pulmonary disease) (HCC)    no inhalers  . Elevated liver function tests 11/26/2004   H/O NEGATIVE LIVER BIOPSY, workup negative  . Gallstones 02/21/2015  . GERD (gastroesophageal reflux disease)   . Hyperlipidemia   . Insomnia   . Lower back pain   . Osteoarthritis   . Ovarian cancer ([place]) 1995  . Restless legs syndrome (RLS)   . RLS (restless legs syndrome)   . Sedative, hypnotic or anxiolytic dependence with withdrawal, 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[place])   . Wears dentures    full upper and lower    Past Surgical History:  Procedure Laterality Date  . ABDOMINAL HYSTERECTOMY    . APPENDECTOMY    . BREAST BIOPSY Left    neg- core  . CESAREAN SECTION    . COLONOSCOPY WITH PROPOFOL N/A 05/30/2018   Procedure: COLONOSCOPY WITH PROPOFOL;  Surgeon: Manya Silvas, MD;  Location[facility] ENDOSCOPY;  Service: Endoscopy;  Laterality: N/A;  . EXCISION MORTON'S NEUROMA Left 10/07/2016   Procedure: EXCISION MORTON'S NEUROMA;  Surgeon: [name], DPM;  Location: [place];  Service: Podiatry;  Laterality: Left;  IV WITH LOCAL  . LIVER BIOPSY  2005   PerPt: Done  to eval elevated LFTs and biopsy provided no definite dx/cause of elevated LFTs  . MULTIPLE TOOTH EXTRACTIONS    . OOPHORECTOMY      Family Psychiatric History: Reviewed family psychiatric history from my progress note on 10/12/2018  Family History:  Family History  Adopted: Yes  Problem Relation Age of Onset  . COPD Mother   . Depression Mother   . Colon cancer Mother        unsure of age  . Cancer Father        Lung CA, Skin Cancer--?type  . COPD Father   . Stroke Father   . Cancer Sister 17       cervical cancer  . COPD Brother   . Alcohol abuse Brother   . Stroke Brother   . COPD Maternal Aunt    . Depression Daughter   . Schizophrenia Son   . Breast cancer Neg Hx     Social History: Reviewed social history from my progress note on 10/12/2028 Social History   Socioeconomic History  . Marital status: Married    Spouse name: Not on file  . Number of children: Not on file  . Years of education: Not on file  . Highest education level: Not on file  Occupational History  . Not on file  Social Needs  . Financial resource strain: Not on file  . Food insecurity:    Worry: Not on file    Inability: Not on file  . Transportation needs:    Medical: Not on file    Non-medical: Not on file  Tobacco Use  . Smoking status: Former Smoker    Packs/day: 1.00    Years: 30.00    Pack years: 30.00    Types: Cigarettes    Last attempt to quit: 04/08/2009    Years since quitting: 9.7  . Smokeless tobacco: Never Used  Substance and Sexual Activity  . Alcohol use: Yes    Alcohol/week: 3.0 - 7.0 standard drinks    Types: 3 - 7 Shots of liquor per week    Comment: occasionally  . Drug use: Yes    Types: Marijuana    Comment: told to refrain until after surgery  . Sexual activity: Yes    Birth control/protection: Surgical  Lifestyle  . Physical activity:    Days per week: Not on file    Minutes per session: Not on file  . Stress: Not on file  Relationships  . Social connections:    Talks on phone: Not on file    Gets together: Not on file    Attends religious service: Not on file    Active member of club or organization: Not on file    Attends meetings of clubs or organizations: Not on file    Relationship status: Not on file  Other Topics Concern  . Not on file  Social History Narrative  . Not on file    Allergies:  Allergies  Allergen Reactions  . Benadryl [Diphenhydramine Hcl]     Hives/ rapid heartrate  . Codeine   . Vicodin [Hydrocodone-Acetaminophen] Other (See Comments)    dizziness    Metabolic Disorder Labs: Lab Results  Component Value Date   HGBA1C  5.6 09/14/2018   MPG 114 09/14/2018   No results found for: PROLACTIN Lab Results  Component Value Date   CHOL 169 09/14/2018   TRIG 263 (H) 09/14/2018   HDL 50 (L) 09/14/2018   CHOLHDL 3.4 09/14/2018   VLDL 18 03/04/2015  [place] 84 09/14/2018   LDLCALC 87 03/04/2015   Lab Results  Component Value Date   TSH 2.320 10/24/2018   TSH 2.00 10/21/2018    Therapeutic Level Labs: Lab Results  Component Value Date   LITHIUM 0.3 (L) 10/24/2018   LITHIUM 0.49 (L) 01/13/2009   No results found for: VALPROATE No components found for:  CBMZ  Current Medications: Current Outpatient Medications  Medication Sig Dispense Refill  . alendronate (FOSAMAX) 70 MG tablet Take 1 tablet (70 mg total) by mouth once a week. Take with a full glass of water on an empty stomach. 12 tablet 3  . ammonium lactate (AMLACTIN) 12 % cream Apply topically as needed for dry skin.    Marland Kitchen benztropine (COGENTIN) 1 MG tablet Take 1 tablet (1 mg total) by mouth 3 (three) times daily. For side effects of geodon 90 tablet 1  . clonazePAM (KLONOPIN) 0.5 MG tablet Take 1 tablet (0.5 mg total) by mouth daily as needed for anxiety. 20 tablet 0  . esomeprazole (NEXIUM) 10 MG packet Take 10 mg by mouth daily as needed.    . hydrOXYzine (ATARAX/VISTARIL) 25 MG tablet Take 1 tablet (25 mg total) by mouth 3 (three) times daily as needed. For severe anxiety sx 270 tablet 1  . lubiprostone (AMITIZA) 24 MCG capsule Take by mouth.    . meloxicam (MOBIC) 7.5 MG tablet Take 1 tablet (7.5 mg total) by mouth daily. 30 tablet 6  . rosuvastatin (CRESTOR) 10 MG tablet Take 1 tablet (10 mg total) by mouth daily. 90 tablet 0  . traZODone (DESYREL) 100 MG tablet Take 1 tablet (100 mg total) by mouth at bedtime. 90 tablet 1  . lamoTRIgine (LAMICTAL) 25 MG tablet Take 1 tablet (25 mg total) by mouth daily. 30 tablet 1  . ziprasidone (GEODON) 80 MG capsule Take 1 capsule (80 mg total) by mouth daily with supper. 90 capsule 0   No current  facility-administered medications for this visit.      Musculoskeletal: Strength & Muscle Tone: wnl Gait & Station: normal Patient leans: N/A  Psychiatric Specialty Exam: Review of Systems  Psychiatric/Behavioral: Positive for memory loss. The patient is nervous/anxious.   All other systems reviewed and are negative.   Blood pressure 125/81, pulse 80, temperature (!) 97.5 F (36.4 C), temperature source Oral, weight 200 lb 9.6 oz (91 kg).Body mass index is 34.43 kg/m.  General Appearance: Casual  Eye Contact:  Fair  Speech:  Normal Rate  Volume:  Normal  Mood:  Anxious  Affect:  Appropriate  Thought Process:  Goal Directed and Descriptions of Associations: Intact  Orientation:  Full (Time, Place, and Person)  Thought Content: Logical   Suicidal Thoughts:  No  Homicidal Thoughts:  No  Memory:  Immediate;   Fair Recent;   limited Remote;   Fair  Judgement:  Fair  Insight:  Fair  Psychomotor Activity:  Normal  Concentration:  Concentration: Fair and Attention Span: Fair  Recall:  [employer] of Knowledge: Fair  Language: Fair  Akathisia:  No  Handed:  Right  AIMS (if indicated): Has restlessness of her lower extremities  Assets:  Communication Skills Desire for Improvement Social Support  ADL's:  Intact  Cognition: WNL  Sleep:  Fair   Screenings: AIMS     Office Visit from 10/24/2018 in [place] Office Visit from 04/04/2018 in [place] Office Visit from 01/03/2018 in [place] Office Visit from 11/18/2016 in [facility]  AIMS Total Score  5  0  0  0    PHQ2-9     Office Visit from 10/21/2018 in [place] Office Visit from 09/29/2018 in [place] Office Visit from 09/14/2018 in [place] Office Visit from 04/27/2018 in [place]  PHQ-2 Total Score  4  4  5  5   PHQ-9 Total  Score  21  21  19  20        Assessment and Plan: Doylene is a 53 year old Caucasian female, married, lives in [place], history of bipolar disorder, anxiety disorder, COPD, RLS, elevated liver enzymes, presented to clinic today for a follow-up visit.  Patient with psychosocial stressors of the death of her son, relationship struggles with her husband and other children.  She is biologically predisposed given her family history of mental health problems.  Patient continues to struggle with adverse side effects to her medications.She also has continued mood lability, anxiety.  Discussed medication readjustment as noted below.  Plan Bipolar disorder- unstable Reduce Geodon to 80 mg p.o. daily with supper Continue Cogentin 1 mg p.o. 3 times daily for EPS Discontinue lithium for side effects of memory problems. Reviewed and discussed Lithium level dated 10/24/2018 - 0.3 - subtherapeutic . Start Lamictal 25 mg p.o. daily.  Provided medication education.  Discussed side effects of Lamictal including Katherina Right syndrome.  For generalized anxiety disorder-unstable Klonopin 0.5 mg as needed once a day for severe anxiety attacks only Hydroxyzine 25 mg p.o. 3 times daily PRN  For acute neuroleptic induced akathisia-unstable Will reduce Geodon dosage to 80 mg p.o. daily with supper Continue Cogentin 1 mg p.o. 3 times daily  For insomnia-improving Trazodone 100 mg p.o. nightly  For memory loss - new onset Will discontinue Lithium. Will monitor closely during following sessions.  Follow-up in clinic in 10 days or sooner if needed.  I have spent atleast 25 minutes face to face with patient today. More than 50 % of the time was spent for psychoeducation and supportive psychotherapy and care coordination.  This note was generated in part or whole with voice recognition software. Voice recognition is usually quite accurate but there are transcription errors that can and very often do occur. I  apologize for any typographical errors that were not detected and corrected.        Ursula Alert, MD 12/23/2018, 8:36 AM

## 2018-12-22 NOTE — Patient Instructions (Signed)
Lamotrigine tablets What is this medicine? LAMOTRIGINE (la MOE tri jeen) is used to control seizures in adults and children with epilepsy and Lennox-Gastaut syndrome. It is also used in adults to treat bipolar disorder. This medicine may be used for other purposes; ask your health care provider or pharmacist if you have questions. COMMON BRAND NAME(S): Lamictal, Subvenite What should I tell my health care provider before I take this medicine? They need to know if you have any of these conditions: -aseptic meningitis during prior use of lamotrigine -depression -folate deficiency -kidney disease -liver disease -suicidal thoughts, plans, or attempt; a previous suicide attempt by you or a family member -an unusual or allergic reaction to lamotrigine or other seizure medications, other medicines, foods, dyes, or preservatives -pregnant or trying to get pregnant -breast-feeding How should I use this medicine? Take this medicine by mouth with a glass of water. Follow the directions on the prescription label. Do not chew these tablets. If this medicine upsets your stomach, take it with food or milk. Take your doses at regular intervals. Do not take your medicine more often than directed. A special MedGuide will be given to you by the pharmacist with each new prescription and refill. Be sure to read this information carefully each time. Talk to your pediatrician regarding the use of this medicine in children. While this drug may be prescribed for children as young as 2 years for selected conditions, precautions do apply. Overdosage: If you think you have taken too much of this medicine contact a poison control center or emergency room at once. NOTE: This medicine is only for you. Do not share this medicine with others. What if I miss a dose? If you miss a dose, take it as soon as you can. If it is almost time for your next dose, take only that dose. Do not take double or extra doses. What may interact  with this medicine? -atazanavir -carbamazepine -female hormones, including contraceptive or birth control pills -lopinavir -methotrexate -phenobarbital -phenytoin -primidone -pyrimethamine -rifampin -ritonavir -trimethoprim -valproic acid This list may not describe all possible interactions. Give your health care provider a list of all the medicines, herbs, non-prescription drugs, or dietary supplements you use. Also tell them if you smoke, drink alcohol, or use illegal drugs. Some items may interact with your medicine. What should I watch for while using this medicine? Visit your doctor or health care professional for regular checks on your progress. If you take this medicine for seizures, wear a Medic Alert bracelet or necklace. Carry an identification card with information about your condition, medicines, and doctor or health care professional. It is important to take this medicine exactly as directed. When first starting treatment, your dose will need to be adjusted slowly. It may take weeks or months before your dose is stable. You should contact your doctor or health care professional if your seizures get worse or if you have any new types of seizures. Do not stop taking this medicine unless instructed by your doctor or health care professional. Stopping your medicine suddenly can increase your seizures or their severity. Contact your doctor or health care professional right away if you develop a rash while taking this medicine. Rashes may be very severe and sometimes require treatment in the hospital. Deaths from rashes have occurred. Serious rashes occur more often in children than adults taking this medicine. It is more common for these serious rashes to occur during the first 2 months of treatment, but a rash can occur at   any time. You may get drowsy, dizzy, or have blurred vision. Do not drive, use machinery, or do anything that needs mental alertness until you know how this medicine  affects you. To reduce dizzy or fainting spells, do not sit or stand up quickly, especially if you are an older patient. Alcohol can increase drowsiness and dizziness. Avoid alcoholic drinks. If you are taking this medicine for bipolar disorder, it is important to report any changes in your mood to your doctor or health care professional. If your condition gets worse, you get mentally depressed, feel very hyperactive or manic, have difficulty sleeping, or have thoughts of hurting yourself or committing suicide, you need to get help from your health care professional right away. If you are a caregiver for someone taking this medicine for bipolar disorder, you should also report these behavioral changes right away. The use of this medicine may increase the chance of suicidal thoughts or actions. Pay special attention to how you are responding while on this medicine. Your mouth may get dry. Chewing sugarless gum or sucking hard candy, and drinking plenty of water may help. Contact your doctor if the problem does not go away or is severe. Women who become pregnant while using this medicine may enroll in the [place] Antiepileptic Drug Pregnancy Registry by calling 1-888-233-2334. This registry collects information about the safety of antiepileptic drug use during pregnancy. This medicine may cause a decrease in folic acid. You should make sure that you get enough folic acid while you are taking this medicine. Discuss the foods you eat and the vitamins you take with your health care professional. What side effects may I notice from receiving this medicine? Side effects that you should report to your doctor or health care professional as soon as possible: -allergic reactions like skin rash, itching or hives, swelling of the face, lips, or tongue -changes in vision -depressed mood -elevated mood, decreased need for sleep, racing thoughts, impulsive behavior -fever with rash, swollen lymph nodes, or  swelling of the face -loss of balance or coordination -mouth sores -redness, blistering, peeling or loosening of the skin, including inside the mouth -right upper belly pain -seizures -severe muscle pain -signs and symptoms of aseptic meningitis such as stiff neck and sensitivity to light, headache, drowsiness, fever, nausea, vomiting, rash -signs of infection - fever or chills, cough, sore throat, pain or difficulty passing urine -suicidal thoughts or other mood changes -swollen lymph nodes -trouble walking -unusual bruising or bleeding -unusually weak or tired -yellowing of the eyes or skin Side effects that usually do not require medical attention (report to your doctor or health care professional if they continue or are bothersome): -diarrhea -dizziness -dry mouth -stuffy nose -tiredness -tremors -trouble sleeping This list may not describe all possible side effects. Call your doctor for medical advice about side effects. You may report side effects to FDA at 1-800-FDA-1088. Where should I keep my medicine? Keep out of reach of children. Store at room temperature between 15 and 30 degrees C (59 and 86 degrees F). Throw away any unused medicine after the expiration date. NOTE: This sheet is a summary. It may not cover all possible information. If you have questions about this medicine, talk to your doctor, pharmacist, or health care provider.  2019 Elsevier/Gold Standard (2017-05-31 16:07:39)  

## 2018-12-23 ENCOUNTER — Encounter: Payer: Self-pay | Admitting: Psychiatry

## 2019-01-02 ENCOUNTER — Ambulatory Visit: Payer: Medicare HMO | Admitting: Psychiatry

## 2019-01-02 ENCOUNTER — Encounter: Payer: Self-pay | Admitting: Psychiatry

## 2019-01-02 ENCOUNTER — Other Ambulatory Visit: Payer: Self-pay

## 2019-01-02 VITALS — BP 127/77 | HR 82 | Temp 97.8°F | Wt 201.0 lb

## 2019-01-02 DIAGNOSIS — F411 Generalized anxiety disorder: Secondary | ICD-10-CM | POA: Diagnosis not present

## 2019-01-02 DIAGNOSIS — R413 Other amnesia: Secondary | ICD-10-CM | POA: Diagnosis not present

## 2019-01-02 DIAGNOSIS — F319 Bipolar disorder, unspecified: Secondary | ICD-10-CM | POA: Diagnosis not present

## 2019-01-02 DIAGNOSIS — G2571 Drug induced akathisia: Secondary | ICD-10-CM | POA: Diagnosis not present

## 2019-01-02 DIAGNOSIS — F5105 Insomnia due to other mental disorder: Secondary | ICD-10-CM | POA: Diagnosis not present

## 2019-01-02 DIAGNOSIS — T43505A Adverse effect of unspecified antipsychotics and neuroleptics, initial encounter: Secondary | ICD-10-CM

## 2019-01-02 MED ORDER — LAMOTRIGINE 25 MG PO TABS: 50.0000 mg | ORAL_TABLET | Freq: Every day | ORAL | 1 refills | Status: DC

## 2019-01-02 MED ORDER — CLONAZEPAM 0.5 MG PO TABS: 0.5000 mg | ORAL_TABLET | ORAL | 0 refills | Status: DC

## 2019-01-02 NOTE — Progress Notes (Signed)
[facility] MD OP Progress Note  01/02/2019 5:43 PM [place]  MRN:  [id number]  Chief Complaint: ' I am here for follow up.' Chief Complaint    Follow-up     HPI: Stacy [place] is a 53 year old Caucasian female, married, lives in [place], has a history of bipolar disorder, anxiety disorder, abnormal liver function test, COPD, chronic back pain, RLS, presented to clinic today for a follow-up visit.  Patient today returns reporting she is making some progress with regards to her mood symptoms.  She reports she is tolerating the lamotrigine well.  Patient reports she continues to be compliant with her Geodon.  She however reports she is worried about weight gain.  She reports now that she is on a lower dose of Geodon her restless leg syndrome has improved.  She continues to take Cogentin.  Patient reports sleep continues to be restless on and off.  She is compliant with trazodone.  Patient reports she has been taking melatonin in the middle of the night if her sleep is disrupted.  Discussed with patient to take the melatonin in combination with trazodone prior to bedtime.  Patient reports some memory problems as improving since the lithium has been discontinued.  She however will continue to monitor her symptoms closely and let writer know if she has any concerns.  Discussed referral to neurology if she continues to have memory changes.  Patient reports she continues to struggle with anxiety symptoms on and off and discuss increasing her lamotrigine.  Discussed with patient to limit the use of Klonopin and discussed with her Klonopin will only be given for short-term.  Also discussed the effect of Klonopin on her memory.   Visit Diagnosis:    ICD-10-CM   1. Bipolar I disorder (HCC) F31.9 lamoTRIgine (LAMICTAL) 25 MG tablet   most recent depressed  2. GAD (generalized anxiety disorder) F41.1 clonazePAM (KLONOPIN) 0.5 MG tablet  3. Acute neuroleptic-induced akathisia G25.71    T43.505A   4.  Insomnia due to mental condition F51.05   5. Memory loss R41.3     Past Psychiatric History: I have reviewed past psychiatric history from my progress note on 10/12/2018  Past Medical History:  Past Medical History:  Diagnosis Date  . Abnormal blood chemistry 10/03/2015  . Anginal pain ([place])   . Anxiety   . Anxiety, generalized 02/21/2015  . Bipolar affective disorder ([place]) 03/08/2013   Overview:  Last Assessment & Plan:  Continue meds, f/u with psych Restart benzo at 1 po BID prn,    . Bipolar disorder ([place])   . 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 Chronic obstructive pulmonary disease ([place]) 10/03/2015  . COPD (chronic obstructive pulmonary disease) (HCC)    no inhalers  . Elevated liver function tests 11/26/2004   H/O NEGATIVE LIVER BIOPSY, workup negative  . Gallstones 02/21/2015  . GERD (gastroesophageal reflux disease)   . Hyperlipidemia   . Insomnia   . Lower back pain   . Osteoarthritis   . Ovarian cancer ([place]) 1995  . Restless legs syndrome (RLS)   . RLS (restless legs syndrome)   . Sedative, hypnotic or anxiolytic dependence with withdrawal, 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[place])   . Wears dentures    full upper and lower    Past Surgical History:  Procedure Laterality Date  . ABDOMINAL HYSTERECTOMY    . APPENDECTOMY    . BREAST BIOPSY Left    neg- core  . CESAREAN SECTION    . COLONOSCOPY WITH PROPOFOL N/A 05/30/2018  Procedure: COLONOSCOPY WITH PROPOFOL;  Surgeon: Manya Silvas, MD;  Location[facility] ENDOSCOPY;  Service: Endoscopy;  Laterality: N/A;  . EXCISION MORTON'S NEUROMA Left 10/07/2016   Procedure: EXCISION MORTON'S NEUROMA;  Surgeon: [name], DPM;  Location: [facility];  Service: Podiatry;  Laterality: Left;  IV WITH LOCAL  . LIVER BIOPSY  2005   PerPt: Done to eval elevated LFTs and biopsy provided no definite dx/cause of elevated LFTs  . MULTIPLE TOOTH EXTRACTIONS    . OOPHORECTOMY      Family Psychiatric History: Reviewed family psychiatric history from my  progress note on 10/12/2018  Family History:  Family History  Adopted: Yes  Problem Relation Age of Onset  . COPD Mother   . Depression Mother   . Colon cancer Mother        unsure of age  . Cancer Father        Lung CA, Skin Cancer--?type  . COPD Father   . Stroke Father   . Cancer Sister 50       cervical cancer  . COPD Brother   . Alcohol abuse Brother   . Stroke Brother   . COPD Maternal Aunt   . Depression Daughter   . Schizophrenia Son   . Breast cancer Neg Hx     Social History: Reviewed social history from my progress note on 10/12/2018 Social History   Socioeconomic History  . Marital status: Married    Spouse name: Not on file  . Number of children: Not on file  . Years of education: Not on file  . Highest education level: Not on file  Occupational History  . Not on file  Social Needs  . Financial resource strain: Not on file  . Food insecurity:    Worry: Not on file    Inability: Not on file  . Transportation needs:    Medical: Not on file    Non-medical: Not on file  Tobacco Use  . Smoking status: Former Smoker    Packs/day: 1.00    Years: 30.00    Pack years: 30.00    Types: Cigarettes    Last attempt to quit: 04/08/2009    Years since quitting: 9.7  . Smokeless tobacco: Never Used  Substance and Sexual Activity  . Alcohol use: Yes    Alcohol/week: 3.0 - 7.0 standard drinks    Types: 3 - 7 Shots of liquor per week    Comment: occasionally  . Drug use: Yes    Types: Marijuana    Comment: told to refrain until after surgery  . Sexual activity: Yes    Birth control/protection: Surgical  Lifestyle  . Physical activity:    Days per week: Not on file    Minutes per session: Not on file  . Stress: Not on file  Relationships  . Social connections:    Talks on phone: Not on file    Gets together: Not on file    Attends religious service: Not on file    Active member of club or organization: Not on file    Attends meetings of clubs or  organizations: Not on file    Relationship status: Not on file  Other Topics Concern  . Not on file  Social History Narrative  . Not on file    Allergies:  Allergies  Allergen Reactions  . Benadryl [Diphenhydramine Hcl]     Hives/ rapid heartrate  . Codeine   . Vicodin [Hydrocodone-Acetaminophen] Other (See Comments)  dizziness    Metabolic Disorder Labs: Lab Results  Component Value Date   HGBA1C 5.6 09/14/2018   MPG 114 09/14/2018   No results found for: PROLACTIN Lab Results  Component Value Date   CHOL 169 09/14/2018   TRIG 263 (H) 09/14/2018   HDL 50 (L) 09/14/2018   CHOLHDL 3.4 09/14/2018   VLDL 18 03/04/2015   LDLCALC 84 09/14/2018   LDLCALC 87 03/04/2015   Lab Results  Component Value Date   TSH 2.320 10/24/2018   TSH 2.00 10/21/2018    Therapeutic Level Labs: Lab Results  Component Value Date   LITHIUM 0.3 (L) 10/24/2018   LITHIUM 0.49 (L) 01/13/2009   No results found for: VALPROATE No components found for:  CBMZ  Current Medications: Current Outpatient Medications  Medication Sig Dispense Refill  . alendronate (FOSAMAX) 70 MG tablet Take 1 tablet (70 mg total) by mouth once a week. Take with a full glass of water on an empty stomach. 12 tablet 3  . ammonium lactate (AMLACTIN) 12 % cream Apply topically as needed for dry skin.    Marland Kitchen benztropine (COGENTIN) 1 MG tablet Take 1 tablet (1 mg total) by mouth 3 (three) times daily. For side effects of geodon 90 tablet 1  . clonazePAM (KLONOPIN) 0.5 MG tablet Take 1 tablet (0.5 mg total) by mouth as directed. Only for severe anxiety attacks 1-2 times per week 15 tablet 0  . esomeprazole (NEXIUM) 10 MG packet Take 10 mg by mouth daily as needed.    . hydrOXYzine (ATARAX/VISTARIL) 25 MG tablet Take 1 tablet (25 mg total) by mouth 3 (three) times daily as needed. For severe anxiety sx 270 tablet 1  . lamoTRIgine (LAMICTAL) 25 MG tablet Take 2 tablets (50 mg total) by mouth daily. 60 tablet 1  . lubiprostone  (AMITIZA) 24 MCG capsule Take by mouth.    . meloxicam (MOBIC) 7.5 MG tablet Take 1 tablet (7.5 mg total) by mouth daily. 30 tablet 6  . rosuvastatin (CRESTOR) 10 MG tablet Take 1 tablet (10 mg total) by mouth daily. 90 tablet 0  . traZODone (DESYREL) 100 MG tablet Take 1 tablet (100 mg total) by mouth at bedtime. 90 tablet 1  . ziprasidone (GEODON) 80 MG capsule Take 1 capsule (80 mg total) by mouth daily with supper. 90 capsule 0   No current facility-administered medications for this visit.      Musculoskeletal: Strength & Muscle Tone: within normal limits Gait & Station: normal Patient leans: N/A  Psychiatric Specialty Exam: Review of Systems  Psychiatric/Behavioral: The patient is nervous/anxious.   All other systems reviewed and are negative.   Blood pressure 127/77, pulse 82, temperature 97.8 F (36.6 C), temperature source Oral, weight 201 lb (91.2 kg).Body mass index is 34.5 kg/m.  General Appearance: Casual  Eye Contact:  Fair  Speech:  Clear and Coherent  Volume:  Normal  Mood:  Anxious  Affect:  Congruent  Thought Process:  Goal Directed and Descriptions of Associations: Intact  Orientation:  Full (Time, Place, and Person)  Thought Content: Logical   Suicidal Thoughts:  No  Homicidal Thoughts:  No  Memory:  Immediate;   Fair Recent;   Fair Remote;   Fair  Judgement:  Intact  Insight:  Fair  Psychomotor Activity:  Normal  Concentration:  Concentration: Fair and Attention Span: Fair  Recall:  [employer] of Knowledge: Fair  Language: Fair  Akathisia:  No  Handed:  Right  AIMS (if indicated): 0  Assets:  Communication Skills Desire for Improvement Social Support  ADL's:  Intact  Cognition: WNL  Sleep:  Fair   Screenings: AIMS     Office Visit from 10/24/2018 in [facility] Office Visit from 04/04/2018 in [facility] Office Visit from 01/03/2018 in [facility] Office  Visit from 11/18/2016 in [facility] Total Score  5  0  0  0    PHQ2-9     Office Visit from 10/21/2018 in [facility] Visit from 09/29/2018 in [facility] Office Visit from 09/14/2018 in [place] [facility] Office Visit from 04/27/2018 in [place]  PHQ-2 Total Score  4  4  5  5   PHQ-9 Total Score  21  21  19  20        Assessment and Plan: [place] is a 53 year old Caucasian female, married, lives in [place], history of bipolar disorder, anxiety disorder, COPD, RLS, elevated liver enzymes, presented to clinic today for a follow-up visit.  Patient with psychosocial stressors of the death of her son, relationship struggles with her husband and other children.  She is biologically predisposed given her family history of mental health problems.  Patient continues to struggle with anxiety symptoms as well as memory changes although progressing.  Will continue to make medication changes as noted below.  Plan Bipolar disorder-improving Geodon at reduced dosage of 80 mg p.o. daily with supper Cogentin 1 mg p.o. 3 times daily for EPS Crease lamotrigine to 50 mg p.o. daily in divided dosage.  For generalized anxiety disorder- improving Klonopin 0.5 mg p.o. daily PRN for severe anxiety attacks only.  She will limit use. Hydroxyzine 25 mg p.o. 3 times daily PRN   Acute neuroleptic induced akathisia- improving Geodon is at a reduced dosage of 80 mg p.o. daily with supper Cogentin 1 mg p.o. 3 times daily.  For insomnia- unstable Melatonin 3 to 5 mg p.o. nightly Trazodone 100 mg p.o. nightly  For memory loss- improving She is currently not on lithium and that has helped.  We will continue to monitor closely.  Discussed referral for psychotherapy if she continues to have significant anxiety symptoms.  Follow-up in clinic in 3 weeks or sooner if needed.  I have spent atleast 15 minutes face to  face with patient today. More than 50 % of the time was spent for psychoeducation and supportive psychotherapy and care coordination.  This note was generated in part or whole with voice recognition software. Voice recognition is usually quite accurate but there are transcription errors that can and very often do occur. I apologize for any typographical errors that were not detected and corrected.      Ursula Alert, MD 01/02/2019, 5:43 PM

## 2019-01-05 ENCOUNTER — Other Ambulatory Visit: Payer: Self-pay | Admitting: Family Medicine

## 2019-01-05 ENCOUNTER — Other Ambulatory Visit: Payer: Self-pay | Admitting: Psychiatry

## 2019-01-05 DIAGNOSIS — F3162 Bipolar disorder, current episode mixed, moderate: Secondary | ICD-10-CM

## 2019-01-05 DIAGNOSIS — F319 Bipolar disorder, unspecified: Secondary | ICD-10-CM

## 2019-01-10 NOTE — Telephone Encounter (Signed)
pt called left message that she needs refills on her medication pt made an appt to be seen but does not have enough medication to get to that appt

## 2019-01-11 ENCOUNTER — Telehealth: Payer: Self-pay | Admitting: Psychiatry

## 2019-01-11 DIAGNOSIS — F3162 Bipolar disorder, current episode mixed, moderate: Secondary | ICD-10-CM

## 2019-01-11 MED ORDER — BENZTROPINE MESYLATE 1 MG PO TABS: 1.0000 mg | ORAL_TABLET | Freq: Three times a day (TID) | ORAL | 1 refills | Status: DC

## 2019-01-11 NOTE — Telephone Encounter (Signed)
Sent cogentin to pharmacy. Every other med she has enough.

## 2019-01-24 ENCOUNTER — Other Ambulatory Visit: Payer: Self-pay

## 2019-01-24 ENCOUNTER — Encounter: Payer: Self-pay | Admitting: Psychiatry

## 2019-01-24 ENCOUNTER — Ambulatory Visit (INDEPENDENT_AMBULATORY_CARE_PROVIDER_SITE_OTHER): Payer: Medicare HMO | Admitting: Psychiatry

## 2019-01-24 ENCOUNTER — Ambulatory Visit: Payer: Medicare HMO | Admitting: Psychiatry

## 2019-01-24 DIAGNOSIS — F3162 Bipolar disorder, current episode mixed, moderate: Secondary | ICD-10-CM | POA: Diagnosis not present

## 2019-01-24 DIAGNOSIS — G2571 Drug induced akathisia: Secondary | ICD-10-CM | POA: Diagnosis not present

## 2019-01-24 DIAGNOSIS — T43505A Adverse effect of unspecified antipsychotics and neuroleptics, initial encounter: Secondary | ICD-10-CM

## 2019-01-24 DIAGNOSIS — F411 Generalized anxiety disorder: Secondary | ICD-10-CM | POA: Diagnosis not present

## 2019-01-24 DIAGNOSIS — F5105 Insomnia due to other mental disorder: Secondary | ICD-10-CM

## 2019-01-24 MED ORDER — ZIPRASIDONE HCL 80 MG PO CAPS: 80.0000 mg | ORAL_CAPSULE | Freq: Every day | ORAL | 1 refills | Status: DC

## 2019-01-24 NOTE — Progress Notes (Signed)
Virtual Visit via Telephone Note  I connected with Stacy Chambers on 01/24/19 at 11:15 AM EDT by telephone and verified that I am speaking with the correct person using two identifiers.   I discussed the limitations, risks, security and privacy concerns of performing an evaluation and management service by telephone and the availability of in person appointments. I also discussed with the patient that there may be a patient responsible charge related to this service. The patient expressed understanding and agreed to proceed.   I discussed the assessment and treatment plan with the patient. The patient was provided an opportunity to ask questions and all were answered. The patient agreed with the plan and demonstrated an understanding of the instructions.   The patient was advised to call back or seek an in-person evaluation if the symptoms worsen or if the condition fails to improve as anticipated.  I provided 15 minutes of non-face-to-face time during this encounter.   Ursula Alert, MD  [place] MD OP Progress Note  01/24/2019 11:31 AM Stacy Chambers  MRN:  [id number]  Chief Complaint: ' I am here for follow up." Chief Complaint    Follow-up     HPI: Stacy Chambers is a 53 year old Caucasian female, married, lives in [place], has a history of bipolar disorder, anxiety disorder, abnormal liver function test, COPD, chronic back pain, RLS, was evaluated by phone today.  Patient reports she had an anxiety attack 2 days ago.  She reports it was triggered by the news of her brother getting insured.  He fractured his neck.  She reports her anxiety symptoms lasted for two  days.  She reports she had to just lay in bed and did not have an appetite.  She however reports she feels better now.  She has been trying to reach her brother to see how he is doing.  She got the news that he got discharged from the hospital.  Patient reports she is compliant with her other medications.  She however reports even  though the Geodon was prescribed for 80 mg she kept receiving 60 mg from her pharmacy.  Discussed with her that a new prescription for Geodon 80 mg can be sent to the pharmacy again today.  She is tolerating the Lamictal well.  She denies any side effects.  Patient reports she has not been able to schedule sessions with a therapist yet.  She reports it is very expensive and she is not able to afford it.  Provided her information for [facility] and [facility].  She will call them and let writer know what she decides.  Patient denies any suicidality, homicidality or perceptual disturbances.  Patient reports even though she is off of the lithium she continues to have some memory problems on and off and currently thinks it is due to her aging.  She will monitor herself closely. Visit Diagnosis:    ICD-10-CM   1. Bipolar 1 disorder, mixed, moderate (HCC) F31.62   2. GAD (generalized anxiety disorder) F41.1   3. Acute neuroleptic-induced akathisia G25.71    T43.505A   4. Insomnia due to mental condition F51.05     Past Psychiatric History: I have reviewed past psychiatric history from my progress note on 10/12/2018.  Past Medical History:  Past Medical History:  Diagnosis Date  . Abnormal blood chemistry 10/03/2015  . Anginal pain ([place])   . Anxiety   . Anxiety, generalized 02/21/2015  . Bipolar affective disorder ([place]) 03/08/2013   Overview:  Last Assessment & Plan:  Continue meds, f/u with psych Restart benzo at 1 po BID prn,    . Bipolar disorder ([place])   . 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 Chronic obstructive pulmonary disease ([place]) 10/03/2015  . COPD (chronic obstructive pulmonary disease) (HCC)    no inhalers  . Elevated liver function tests 11/26/2004   H/O NEGATIVE LIVER BIOPSY, workup negative  . Gallstones 02/21/2015  . GERD (gastroesophageal reflux disease)   . Hyperlipidemia   . Insomnia   . Lower back pain   . Osteoarthritis   . Ovarian cancer ([place]) 1995  . Restless legs syndrome (RLS)    . RLS (restless legs syndrome)   . Sedative, hypnotic or anxiolytic dependence with withdrawal, 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[place] [place])   . Wears dentures    full upper and lower    Past Surgical History:  Procedure Laterality Date  . ABDOMINAL HYSTERECTOMY    . APPENDECTOMY    . BREAST BIOPSY Left    neg- core  . CESAREAN SECTION    . COLONOSCOPY WITH PROPOFOL N/A 05/30/2018   Procedure: COLONOSCOPY WITH PROPOFOL;  Surgeon: Manya Silvas, MD;  Location: [facility] ENDOSCOPY;  Service: Endoscopy;  Laterality: N/A;  . EXCISION MORTON'S NEUROMA Left 10/07/2016   Procedure: EXCISION MORTON'S NEUROMA;  Surgeon: [name], DPM;  Location: [facility];  Service: Podiatry;  Laterality: Left;  IV WITH LOCAL  . LIVER BIOPSY  2005   PerPt: Done to eval elevated LFTs and biopsy provided no definite dx/cause of elevated LFTs  . MULTIPLE TOOTH EXTRACTIONS    . OOPHORECTOMY      Family Psychiatric History: Reviewed family psychiatric history from my progress note on 10/12/2018.  Family History:  Family History  Adopted: Yes  Problem Relation Age of Onset  . COPD Mother   . Depression Mother   . Colon cancer Mother        unsure of age  . Cancer Father        Lung CA, Skin Cancer--?type  . COPD Father   . Stroke Father   . Cancer Sister 73       cervical cancer  . COPD Brother   . Alcohol abuse Brother   . Stroke Brother   . COPD Maternal Aunt   . Depression Daughter   . Schizophrenia Son   . Breast cancer Neg Hx     Social History: Reviewed social history from my progress note on 10/12/2018. Social History   Socioeconomic History  . Marital status: Married    Spouse name: Not on file  . Number of children: Not on file  . Years of education: Not on file  . Highest education level: Not on file  Occupational History  . Not on file  Social Needs  . Financial resource strain: Not on file  . Food insecurity:    Worry: Not on file    Inability: Not on file  . Transportation needs:     Medical: Not on file    Non-medical: Not on file  Tobacco Use  . Smoking status: Former Smoker    Packs/day: 1.00    Years: 30.00    Pack years: 30.00    Types: Cigarettes    Last attempt to quit: 04/08/2009    Years since quitting: 9.8  . Smokeless tobacco: Never Used  Substance and Sexual Activity  . Alcohol use: Yes    Alcohol/week: 3.0 - 7.0 standard drinks    Types: 3 - 7 Shots of liquor per week  Comment: occasionally  . Drug use: Yes    Types: Marijuana    Comment: told to refrain until after surgery  . Sexual activity: Yes    Birth control/protection: Surgical  Lifestyle  . Physical activity:    Days per week: Not on file    Minutes per session: Not on file  . Stress: Not on file  Relationships  . Social connections:    Talks on phone: Not on file    Gets together: Not on file    Attends religious service: Not on file    Active member of club or organization: Not on file    Attends meetings of clubs or organizations: Not on file    Relationship status: Not on file  Other Topics Concern  . Not on file  Social History Narrative  . Not on file    Allergies:  Allergies  Allergen Reactions  . Benadryl [Diphenhydramine Hcl]     Hives/ rapid heartrate  . Codeine   . Vicodin [Hydrocodone-Acetaminophen] Other (See Comments)    dizziness    Metabolic Disorder Labs: Lab Results  Component Value Date   HGBA1C 5.6 09/14/2018   MPG 114 09/14/2018   No results found for: PROLACTIN Lab Results  Component Value Date   CHOL 169 09/14/2018   TRIG 263 (H) 09/14/2018   HDL 50 (L) 09/14/2018   CHOLHDL 3.4 09/14/2018   VLDL 18 03/04/2015   LDLCALC 84 09/14/2018   LDLCALC 87 03/04/2015   Lab Results  Component Value Date   TSH 2.320 10/24/2018   TSH 2.00 10/21/2018    Therapeutic Level Labs: Lab Results  Component Value Date   LITHIUM 0.3 (L) 10/24/2018   LITHIUM 0.49 (L) 01/13/2009   No results found for: VALPROATE No components found for:   CBMZ  Current Medications: Current Outpatient Medications  Medication Sig Dispense Refill  . alendronate (FOSAMAX) 70 MG tablet Take 1 tablet (70 mg total) by mouth once a week. Take with a full glass of water on an empty stomach. 12 tablet 3  . ammonium lactate (AMLACTIN) 12 % cream Apply topically as needed for dry skin.    Marland Kitchen benztropine (COGENTIN) 1 MG tablet Take 1 tablet (1 mg total) by mouth 3 (three) times daily. For side effects of geodon 90 tablet 1  . clonazePAM (KLONOPIN) 0.5 MG tablet Take 1 tablet (0.5 mg total) by mouth as directed. Only for severe anxiety attacks 1-2 times per week 15 tablet 0  . esomeprazole (NEXIUM) 10 MG packet Take 10 mg by mouth daily as needed.    . hydrOXYzine (ATARAX/VISTARIL) 25 MG tablet Take 1 tablet (25 mg total) by mouth 3 (three) times daily as needed. For severe anxiety sx 270 tablet 1  . lamoTRIgine (LAMICTAL) 25 MG tablet Take 2 tablets (50 mg total) by mouth daily. 60 tablet 1  . lubiprostone (AMITIZA) 24 MCG capsule Take by mouth.    . meloxicam (MOBIC) 7.5 MG tablet TAKE 1 TABLET EVERY DAY 90 tablet 0  . rosuvastatin (CRESTOR) 10 MG tablet TAKE 1 TABLET EVERY DAY 90 tablet 0  . traZODone (DESYREL) 100 MG tablet Take 1 tablet (100 mg total) by mouth at bedtime. 90 tablet 1  . ziprasidone (GEODON) 80 MG capsule Take 1 capsule (80 mg total) by mouth daily with supper. 90 capsule 1   No current facility-administered medications for this visit.      Musculoskeletal: Strength & Muscle Tone: UTA Gait & Station: UTA Patient leans: N/A  Psychiatric Specialty Exam: Review of Systems  Psychiatric/Behavioral: The patient is nervous/anxious.   All other systems reviewed and are negative.   There were no vitals taken for this visit.There is no height or weight on file to calculate BMI.  General Appearance: UTA  Eye Contact:  UTA  Speech:  Clear and Coherent  Volume:  Normal  Mood:  Anxious  Affect:  UTA  Thought Process:  Goal Directed and  Descriptions of Associations: Intact  Orientation:  Full (Time, Place, and Person)  Thought Content: Logical   Suicidal Thoughts:  No  Homicidal Thoughts:  No  Memory:  Immediate;   Fair Recent;   Fair Remote;   Fair  Judgement:  Fair  Insight:  Fair  Psychomotor Activity:  UTA  Concentration:  Concentration: Fair and Attention Span: Fair  Recall:  [employer] of Knowledge: Fair  Language: Fair  Akathisia:  No  Handed:  Right  AIMS (if indicated):UTA  Assets:  Communication Skills Desire for Improvement Housing Social Support  ADL's:  Intact  Cognition: WNL  Sleep:  Fair   Screenings: AIMS     Office Visit from 10/24/2018 in [facility] Office Visit from 04/04/2018 in [facility] Office Visit from 01/03/2018 in [facility] Office Visit from 11/18/2016 in [facility] Total Score  5  0  0  0    PHQ2-9     Office Visit from 10/21/2018 in [facility] Visit from 09/29/2018 in [facility] Office Visit from 09/14/2018 in [facility] Office Visit from 04/27/2018 in [facility]  PHQ-2 Total Score  4  4  5  5   PHQ-9 Total Score  21  21  19  20        Assessment and Plan: Stacy Chambers is a 53 year old Caucasian female, married, lives in [place], has a history of bipolar disorder, anxiety disorder, COPD, RLS, elevated liver enzymes, was evaluated by phone today.  Patient with psychosocial stressors of the death of her son, relationship struggles with her husband and other children.  She is also biologically predisposed given her family history of mental health problems.  She does have increased anxiety due to the coronavirus outbreak.  Discussed medication readjustment as noted below.  Plan Bipolar disorder-some progress Geodon 80 mg p.o. daily with supper.  Reduced dosage. Patient however reports she  continues to go on 60 mg which was sent out by her pharmacy.  Discussed with her that a new prescription will be sent today. Lamictal 50 mg p.o. daily in divided dosage. Cogentin 1 mg p.o. 3 times daily for EPS  For GAD- restless Klonopin 0.5 mg as needed for severe anxiety attacks only Hydroxyzine 25 mg p.o. 3 times daily as needed Discussed with patient about referral for psychotherapy- provided her information for [facility] as well as [facility] given her financial problems.  She will reach out to them.   For acute neuroleptic induced akathisia-improving Geodon dosage was reduced last visit. Cogentin 1 mg p.o. 3 times daily  For insomnia- improving Melatonin 3 to 5 mg p.o. nightly Trazodone 100 mg p.o. nightly  Memory loss- worsening on and off.  Patient reports she stopped the lithium which helped to some extent.  She will monitor her symptoms closely.  Follow-up in clinic in 1 to 2 months or sooner if needed.  Patient has appointment on May 27 at 9 AM.  I have spent atleast  15 minutes non face to face with patient today. More than 50 % of the time was spent for psychoeducation and supportive psychotherapy and care coordination.  This note was generated in part or whole with voice recognition software. Voice recognition is usually quite accurate but there are transcription errors that can and very often do occur. I apologize for any typographical errors that were not detected and corrected.        Ursula Alert, MD 01/24/2019, 11:31 AM

## 2019-01-31 ENCOUNTER — Ambulatory Visit (INDEPENDENT_AMBULATORY_CARE_PROVIDER_SITE_OTHER): Payer: Medicare HMO | Admitting: Family Medicine

## 2019-01-31 ENCOUNTER — Other Ambulatory Visit: Payer: Self-pay

## 2019-01-31 ENCOUNTER — Ambulatory Visit: Payer: Medicare PPO | Admitting: Family Medicine

## 2019-01-31 ENCOUNTER — Encounter: Payer: Self-pay | Admitting: Family Medicine

## 2019-01-31 VITALS — BP 122/70 | HR 76 | Temp 98.8°F | Resp 18 | Ht 64.0 in | Wt 195.2 lb

## 2019-01-31 DIAGNOSIS — R7989 Other specified abnormal findings of blood chemistry: Secondary | ICD-10-CM

## 2019-01-31 DIAGNOSIS — E781 Pure hyperglyceridemia: Secondary | ICD-10-CM

## 2019-01-31 DIAGNOSIS — R413 Other amnesia: Secondary | ICD-10-CM

## 2019-01-31 DIAGNOSIS — F1320 Sedative, hypnotic or anxiolytic dependence, uncomplicated: Secondary | ICD-10-CM

## 2019-01-31 DIAGNOSIS — Z5181 Encounter for therapeutic drug level monitoring: Secondary | ICD-10-CM

## 2019-01-31 DIAGNOSIS — R945 Abnormal results of liver function studies: Secondary | ICD-10-CM | POA: Diagnosis not present

## 2019-01-31 DIAGNOSIS — F319 Bipolar disorder, unspecified: Secondary | ICD-10-CM

## 2019-01-31 MED ORDER — MELOXICAM 7.5 MG PO TABS: 7.5000 mg | ORAL_TABLET | Freq: Two times a day (BID) | ORAL | 0 refills | Status: DC | PRN

## 2019-01-31 MED ORDER — AMMONIUM LACTATE 12 % EX CREA: TOPICAL_CREAM | CUTANEOUS | 1 refills | Status: DC | PRN

## 2019-01-31 MED ORDER — ESOMEPRAZOLE MAGNESIUM 20 MG PO CPDR: 20.0000 mg | DELAYED_RELEASE_CAPSULE | Freq: Every day | ORAL | 1 refills | Status: DC | PRN

## 2019-01-31 NOTE — Progress Notes (Signed)
Patient ID: Stacy Chambers, female    DOB: 1966-04-03, 53 y.o.   MRN: [id number]  PCP: Stacy Grana, PA-C  Chief Complaint  Patient presents with  . Irritable Bowel Syndrome    follow up  . Memory Loss    Subjective:   Stacy Chambers is a 53 y.o. female, presents to clinic with CC of routine f/up for HLD/hypertriglyceride, med monitoring, and she also complains of memory loss - which she has discussed with her psychiatrist mutliple times and meds have recently been adjusted (past 1-2 months)  Lithium was discontinued, she says benzos were being weaned (klonopin).  Cogentin she is taking 2x a day, it is prescribed TID.  She is still taking geodon and trazodone in the evening and at night, still has trouble sleeping.  She has vistaril 25 mg TID PRN.    Per chart review she told psychiatrist that memory issues were a little better after d/c of lithium.  OV reviewed from 11/21/2018, 12/22/2018, 01/02/2019, 01/11/2019, 01/24/2019. Lithium d/c'd, geodon increased and then decreased, cogentin increased, lamictal added.  Copy of last Psychiatry A&P 01/24/2019: Assessment and Plan: Stacy Chambers is a 53 year old Caucasian female, married, lives in [place], has a history of bipolar disorder, anxiety disorder, COPD, RLS, elevated liver enzymes, was evaluated by phone today.  Patient with psychosocial stressors of the death of her son, relationship struggles with her husband and other children.  She is also biologically predisposed given her family history of mental health problems.  She does have increased anxiety due to the coronavirus outbreak.  Discussed medication readjustment as noted below.  Plan Bipolar disorder-some progress Geodon 80 mg p.o. daily with supper.  Reduced dosage. Patient however reports she continues to go on 60 mg which was sent out by her pharmacy.  Discussed with her that a new prescription will be sent today. Lamictal 50 mg p.o. daily in divided dosage. Cogentin 1 mg p.o. 3  times daily for EPS  For GAD- restless Klonopin 0.5 mg as needed for severe anxiety attacks only Hydroxyzine 25 mg p.o. 3 times daily as needed Discussed with patient about referral for psychotherapy- provided her information for [facility] as well as [facility] given her financial problems.  She will reach out to them.   For acute neuroleptic induced akathisia-improving Geodon dosage was reduced last visit. Cogentin 1 mg p.o. 3 times daily  For insomnia- improving Melatonin 3 to 5 mg p.o. nightly Trazodone 100 mg p.o. nightly  Memory loss- worsening on and off.  Patient reports she stopped the lithium which helped to some extent.  She will monitor her symptoms closely.  Today she says she forgets what she's talking about just after she said it and she's worried about forgetting something important while shes out running errands, etc. Has been going on for months to years. Last MWV screening was negative and pt reported no concerns with memory   Asked her about IBS/abdominal pain/diarrhea and if she follow up with GI and she wasn't sure what I was talking about.   She endorses no abdominal pain, normal bowel patterns for her where she intermittently gets constipated and uses amitiza.  No bloating, melena, BRBPR.    She has some aches in her joints when laying down for longer periods.  Mild to moderate, no acute changes.  Still taking mobic 7.5 mg BID (I discontinued previously due to lithium use.) No joint swelling or redness.  She is still taking fosamax.  No problems or SE with that.  She has some mild LE edema when she is not doing much, it goes away over night or if she walks more.  No swelling right now.    She requests refills on amlactin cream for dry skin, and nexium for GERD.   Patient Active Problem List   Diagnosis Date Noted  . At risk for long QT syndrome 10/20/2018  . Chronic obstructive pulmonary disease ([place]) 10/03/2015  . Abnormal blood chemistry 10/03/2015  . HLD  (hyperlipidemia) 10/03/2015  . Restless leg 04/09/2015  . Osteoporosis 03/04/2015  . 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 Bipolar affective disorder, current episode manic with psychotic symptoms ([place]) 02/21/2015  . Anxiety, generalized 02/21/2015  . Gallstones 02/21/2015  . Insomnia, persistent 02/21/2015  . Restless leg syndrome 02/21/2015  . Affective psychosis, bipolar ([place]) 02/21/2015  . Calculus of gallbladder 02/21/2015  . Cannot sleep 02/21/2015  . Atypical chest pain 10/30/2014  . Benzodiazepine withdrawal ([place]) 10/30/2014  . Chest pain 10/30/2014  . 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 Bipolar disorder ([place]) 03/08/2013  . Bipolar affective disorder ([place]) 03/08/2013  . COPD (chronic obstructive pulmonary disease) ([place])   . Hyperlipidemia   . Elevated liver function tests   . Restless legs syndrome (RLS)      Prior to Admission medications   Medication Sig Start Date End Date Taking? Authorizing Provider  alendronate (FOSAMAX) 70 MG tablet Take 1 tablet (70 mg total) by mouth once a week. Take with a full glass of water on an empty stomach. 03/04/15  Yes Dena Billet B, PA-C  ammonium lactate (AMLACTIN) 12 % cream Apply topically as needed for dry skin.   Yes [provider]  benztropine (COGENTIN) 1 MG tablet Take 1 tablet (1 mg total) by mouth 3 (three) times daily. For side effects of geodon 01/11/19  Yes Eappen, Ria Clock, MD  clonazePAM (KLONOPIN) 0.5 MG tablet Take 1 tablet (0.5 mg total) by mouth as directed. Only for severe anxiety attacks 1-2 times per week 01/02/19  Yes Eappen, Saramma, MD  esomeprazole (NEXIUM) 10 MG packet Take 10 mg by mouth daily as needed.   Yes [provider]  fluticasone (FLOVENT HFA) 220 MCG/ACT inhaler Inhale into the lungs 2 (two) times daily.   Yes [provider]  hydrOXYzine (ATARAX/VISTARIL) 25 MG tablet Take 1 tablet (25 mg total) by mouth 3 (three) times daily as needed. For severe anxiety sx 10/24/18  Yes  Ursula Alert, MD  lamoTRIgine (LAMICTAL) 25 MG tablet Take 2 tablets (50 mg total) by mouth daily. 01/02/19  Yes Ursula Alert, MD  lubiprostone (AMITIZA) 24 MCG capsule Take by mouth. 08/21/15  Yes [provider]  meloxicam (MOBIC) 7.5 MG tablet TAKE 1 TABLET EVERY DAY 01/05/19  Yes Stacy Grana, PA-C  rosuvastatin (CRESTOR) 10 MG tablet TAKE 1 TABLET EVERY DAY 01/05/19  Yes Stacy Grana, PA-C  traZODone (DESYREL) 100 MG tablet Take 1 tablet (100 mg total) by mouth at bedtime. 10/24/18  Yes Ursula Alert, MD  ziprasidone (GEODON) 80 MG capsule Take 1 capsule (80 mg total) by mouth daily with supper. 01/24/19  Yes Ursula Alert, MD     Allergies  Allergen Reactions  . Benadryl [Diphenhydramine Hcl]     Hives/ rapid heartrate  . Codeine   . Vicodin [Hydrocodone-Acetaminophen] Other (See Comments)    dizziness     Family History  Adopted: Yes  Problem Relation Age of Onset  . COPD Mother   . Depression Mother   . Colon cancer Mother  unsure of age  . Cancer Father        Lung CA, Skin Cancer--?type  . COPD Father   . Stroke Father   . Cancer Sister 33       cervical cancer  . COPD Brother   . Alcohol abuse Brother   . Stroke Brother   . COPD Maternal Aunt   . Depression Daughter   . Schizophrenia Son   . Breast cancer Neg Hx      Social History   Socioeconomic History  . Marital status: Married    Spouse name: Not on file  . Number of children: Not on file  . Years of education: Not on file  . Highest education level: Not on file  Occupational History  . Not on file  Social Needs  . Financial resource strain: Not on file  . Food insecurity:    Worry: Not on file    Inability: Not on file  . Transportation needs:    Medical: Not on file    Non-medical: Not on file  Tobacco Use  . Smoking status: Former Smoker    Packs/day: 1.00    Years: 30.00    Pack years: 30.00    Types: Cigarettes    Last attempt to quit: 04/08/2009    Years  since quitting: 9.8  . Smokeless tobacco: Never Used  Substance and Sexual Activity  . Alcohol use: Yes    Alcohol/week: 3.0 - 7.0 standard drinks    Types: 3 - 7 Shots of liquor per week    Comment: occasionally  . Drug use: Yes    Types: Marijuana    Comment: told to refrain until after surgery  . Sexual activity: Yes    Birth control/protection: Surgical  Lifestyle  . Physical activity:    Days per week: Not on file    Minutes per session: Not on file  . Stress: Not on file  Relationships  . Social connections:    Talks on phone: Not on file    Gets together: Not on file    Attends religious service: Not on file    Active member of club or organization: Not on file    Attends meetings of clubs or organizations: Not on file    Relationship status: Not on file  . Intimate partner violence:    Fear of current or ex partner: Not on file    Emotionally abused: Not on file    Physically abused: Not on file    Forced sexual activity: Not on file  Other Topics Concern  . Not on file  Social History Narrative  . Not on file     Review of Systems  Constitutional: Negative.   HENT: Negative.   Eyes: Negative.   Respiratory: Negative.   Cardiovascular: Negative.   Gastrointestinal: Negative.  Negative for abdominal pain, constipation, diarrhea, nausea and vomiting.  Endocrine: Negative.   Genitourinary: Negative.   Musculoskeletal: Negative.  Negative for myalgias.  Skin: Negative.  Negative for color change, pallor and rash.  Allergic/Immunologic: Negative.   Neurological: Negative.  Negative for dizziness, tremors, seizures, syncope, facial asymmetry, speech difficulty, weakness, light-headedness, numbness and headaches.  Hematological: Negative.   Psychiatric/Behavioral: Positive for sleep disturbance. Negative for self-injury and suicidal ideas.  All other systems reviewed and are negative.      Objective:    Vitals:   01/31/19 0911  BP: 122/70  Pulse: 76   Resp: 18  Temp: 98.8 F (37.1 C)  SpO2:  98%  Weight: 195 lb 3.2 oz (88.5 kg)  Height: 5\' 4"  (1.626 m)      Physical Exam Vitals signs and nursing note reviewed.  Constitutional:      General: She is not in acute distress.    Appearance: She is well-developed. She is obese. She is not ill-appearing, toxic-appearing or diaphoretic.     Comments: Wearing mask (COVID)  HENT:     Head: Normocephalic and atraumatic.     Right Ear: External ear normal.     Left Ear: External ear normal.  Eyes:     General: No scleral icterus.       Right eye: No discharge.        Left eye: No discharge.     Conjunctiva/sclera: Conjunctivae normal.  Neck:     Musculoskeletal: Neck supple.     Trachea: No tracheal deviation.  Cardiovascular:     Rate and Rhythm: Normal rate and regular rhythm.     Pulses: Normal pulses.     Heart sounds: Normal heart sounds. No murmur. No friction rub. No gallop.   Pulmonary:     Effort: Pulmonary effort is normal. No respiratory distress.     Breath sounds: Normal breath sounds. No stridor. No wheezing, rhonchi or rales.  Abdominal:     General: Bowel sounds are normal. There is no distension.     Tenderness: There is no abdominal tenderness.  Musculoskeletal: Normal range of motion.     Right lower leg: No edema.     Left lower leg: No edema.  Skin:    General: Skin is warm and dry.     Capillary Refill: Capillary refill takes less than 2 seconds.     Coloration: Skin is not jaundiced or pale.     Findings: No rash.  Neurological:     Mental Status: She is alert and oriented to person, place, and time.     Sensory: Sensation is intact.     Motor: No weakness, tremor or abnormal muscle tone.     Coordination: Coordination normal.     Gait: Gait normal.  Psychiatric:        Mood and Affect: Mood and affect normal.        Speech: Speech normal.        Behavior: Behavior normal. Behavior is cooperative.        Thought Content: Thought content normal.  Thought content is not paranoid or delusional. Thought content does not include homicidal or suicidal ideation. Thought content does not include homicidal or suicidal plan.        Cognition and Memory: Cognition is not impaired. She exhibits impaired remote memory (forgetful about past visits and topics discussed in clinic at previous appts). She does not exhibit impaired recent memory (able to recall items and addresses given).       01/31/19 0937  6 CIT Score  What year is it? 0 points  What month is it? 0 points  Give patient an address phrase to remember (5 components) [address] [place], [place]  About what time is it? 0 points  Count backwards from 20 to 1 2 points  Say the months of the year in reverse 2 points  Repeat the address phrase from earlier 0 points  6 CIT Score 4 points        Assessment & Plan:      ICD-10-CM   1. Memory problem R41.3 Ambulatory referral to Neurology   ongoing for many months -  addressed recently by psych, no acute changes, refer to Neuro  2. Elevated liver function tests Y70.6 COMPLETE METABOLIC PANEL WITH GFR    Lipid Panel    CANCELED: Lipid Panel    CANCELED: COMPLETE METABOLIC PANEL WITH GFR   monitoring, secondary to HLD, due for repeat labs  3. 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 Bipolar affective disorder, remission status 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[place]) F31.9 Ambulatory referral to Neurology    CANCELED: Lithium level   per psych  5. Medication monitoring encounter C37.62 COMPLETE METABOLIC PANEL WITH GFR    CBC with Differential    Lipid Panel    CANCELED: Lipid Panel    CANCELED: COMPLETE METABOLIC PANEL WITH GFR    CANCELED: Lithium level   now off lithium - all other meds per psychiatry, doing benzo taper?  6. Hypertriglyceridemia G31.5 COMPLETE METABOLIC PANEL WITH GFR    CBC with Differential    Lipid Panel    CANCELED: Lipid Panel    CANCELED:  COMPLETE METABOLIC PANEL WITH GFR   tolerating meds, no SE, due for fasting labs    Pt here for routine recheck, also complains of memory problems for several months - has discussed with her psychiatrist several times, lithium was started and then discontinued - thought to be causing.  Reviewed recent Psychiatry appts - last note says memory was a little better with d/c of lithium but pt concerned that over all gradually progressing.  Pt A&Ox3 here, good quick recall, some difficulty with remembering past visits with me and past topics and complaints - have noticed this to be fairly consistent since I started seeing her after her PCP left practice ~5-6 months ago. 6CIT done, very minimal difficulty with short term memory and recall.  Per psychiatry - she was going to refer to neurology.  Will enter referral, but pt encouraged to also contact psychiatrist to discuss in case there were any other CNS acting meds that her MD needed to address/adjust.  Pt not fasting today, will return for fasting labs this week- for memory r/o electrolyte derangement, other than that likely due to psych meds and/or part of her psychiatric conditions - she speaks very fast, seems a little hyperactive and gets lost in her own sentences some times.  Does not seem to be new or acutely worsening - the first or second visit I had with her she was holding papers from psychiatry that instructed her with clearly written instructions to get lithium levels at specific intervals and she did not seem aware of the plan at all, although she did come to our office for some monitoring?   I do agree with psych that neuro referral and eval may be helpful - non-urgent and will likely be delayed with COVID-19 and routine referrals. Otherwise will continue to screen and monitor with her Medicare well visits.  4 month f/up for next appt. Sooner follow up depending on any lab abnormalities.  Med on chart refilled - able to start mobic again for  arthritis pains, now that off lithium.   Stacy Grana, PA-C 01/31/19 2:32 PM

## 2019-02-01 ENCOUNTER — Other Ambulatory Visit: Payer: Medicare HMO

## 2019-02-01 DIAGNOSIS — R945 Abnormal results of liver function studies: Principal | ICD-10-CM

## 2019-02-01 DIAGNOSIS — R7989 Other specified abnormal findings of blood chemistry: Secondary | ICD-10-CM

## 2019-02-01 DIAGNOSIS — Z5181 Encounter for therapeutic drug level monitoring: Secondary | ICD-10-CM | POA: Diagnosis not present

## 2019-02-01 DIAGNOSIS — E781 Pure hyperglyceridemia: Secondary | ICD-10-CM | POA: Diagnosis not present

## 2019-02-02 LAB — COMPLETE METABOLIC PANEL WITH GFR
AG Ratio: 2 (calc) (ref 1.0–2.5)
ALT: 30 U/L — ABNORMAL HIGH (ref 6–29)
AST: 25 U/L (ref 10–35)
Albumin: 4.5 g/dL (ref 3.6–5.1)
Alkaline phosphatase (APISO): 96 U/L (ref 37–153)
BUN: 14 mg/dL (ref 7–25)
CO2: 30 mmol/L (ref 20–32)
Calcium: 10 mg/dL (ref 8.6–10.4)
Chloride: 98 mmol/L (ref 98–110)
Creat: 0.98 mg/dL (ref 0.50–1.05)
GFR, Est African American: 76 mL/min/{1.73_m2} (ref >=60)
GFR, Est Non African American: 66 mL/min/{1.73_m2} (ref >=60)
Globulin: 2.3 g/dL (calc) (ref 1.9–3.7)
Glucose, Bld: 92 mg/dL (ref 65–99)
Potassium: 4 mmol/L (ref 3.5–5.3)
Sodium: 139 mmol/L (ref 135–146)
Total Bilirubin: 0.5 mg/dL (ref 0.2–1.2)
Total Protein: 6.8 g/dL (ref 6.1–8.1)

## 2019-02-02 LAB — CBC WITH DIFFERENTIAL/PLATELET
Absolute Monocytes: 389 cells/uL (ref 200–950)
Basophils Absolute: 50 cells/uL (ref 0–200)
Basophils Relative: 0.7 %
Eosinophils Absolute: 72 cells/uL (ref 15–500)
Eosinophils Relative: 1 %
HCT: 37.5 % (ref 35.0–45.0)
Hemoglobin: 12.9 g/dL (ref 11.7–15.5)
Lymphs Abs: 2599 cells/uL (ref 850–3900)
MCH: 29.9 pg (ref 27.0–33.0)
MCHC: 34.4 g/dL (ref 32.0–36.0)
MCV: 87 fL (ref 80.0–100.0)
MPV: 9.9 fL (ref 7.5–12.5)
Monocytes Relative: 5.4 %
Neutro Abs: 4090 cells/uL (ref 1500–7800)
Neutrophils Relative %: 56.8 %
Platelets: 212 10*3/uL (ref 140–400)
RBC: 4.31 10*6/uL (ref 3.80–5.10)
RDW: 12.6 % (ref 11.0–15.0)
Total Lymphocyte: 36.1 %
WBC: 7.2 10*3/uL (ref 3.8–10.8)

## 2019-02-02 LAB — LIPID PANEL
Cholesterol: 139 mg/dL (ref <200)
HDL: 36 mg/dL — ABNORMAL LOW (ref >=50)
LDL Cholesterol (Calc): 77 mg/dL (calc)
Non-HDL Cholesterol (Calc): 103 mg/dL (calc) (ref <130)
Total CHOL/HDL Ratio: 3.9 (calc) (ref <5.0)
Triglycerides: 166 mg/dL — ABNORMAL HIGH (ref <150)

## 2019-03-20 ENCOUNTER — Other Ambulatory Visit: Payer: Self-pay | Admitting: Psychiatry

## 2019-03-20 DIAGNOSIS — F411 Generalized anxiety disorder: Secondary | ICD-10-CM

## 2019-03-22 ENCOUNTER — Other Ambulatory Visit: Payer: Self-pay | Admitting: Family Medicine

## 2019-03-22 ENCOUNTER — Encounter: Payer: Self-pay | Admitting: Psychiatry

## 2019-03-22 ENCOUNTER — Other Ambulatory Visit: Payer: Self-pay

## 2019-03-22 ENCOUNTER — Ambulatory Visit (INDEPENDENT_AMBULATORY_CARE_PROVIDER_SITE_OTHER): Payer: Medicare HMO | Admitting: Psychiatry

## 2019-03-22 DIAGNOSIS — R413 Other amnesia: Secondary | ICD-10-CM | POA: Diagnosis not present

## 2019-03-22 DIAGNOSIS — F5105 Insomnia due to other mental disorder: Secondary | ICD-10-CM

## 2019-03-22 DIAGNOSIS — T43505A Adverse effect of unspecified antipsychotics and neuroleptics, initial encounter: Secondary | ICD-10-CM | POA: Diagnosis not present

## 2019-03-22 DIAGNOSIS — F3162 Bipolar disorder, current episode mixed, moderate: Secondary | ICD-10-CM | POA: Diagnosis not present

## 2019-03-22 DIAGNOSIS — F411 Generalized anxiety disorder: Secondary | ICD-10-CM | POA: Diagnosis not present

## 2019-03-22 DIAGNOSIS — G2571 Drug induced akathisia: Secondary | ICD-10-CM | POA: Diagnosis not present

## 2019-03-22 MED ORDER — LAMOTRIGINE 25 MG PO TABS: 75.0000 mg | ORAL_TABLET | Freq: Every day | ORAL | 0 refills | Status: DC

## 2019-03-22 MED ORDER — BENZTROPINE MESYLATE 1 MG PO TABS: 1.0000 mg | ORAL_TABLET | Freq: Three times a day (TID) | ORAL | 1 refills | Status: DC

## 2019-03-22 MED ORDER — ZIPRASIDONE HCL 20 MG PO CAPS: 20.0000 mg | ORAL_CAPSULE | Freq: Every day | ORAL | 1 refills | Status: DC

## 2019-03-22 MED ORDER — ZIPRASIDONE HCL 80 MG PO CAPS: 80.0000 mg | ORAL_CAPSULE | Freq: Every day | ORAL | 1 refills | Status: DC

## 2019-03-22 MED ORDER — TRAZODONE HCL 100 MG PO TABS: 100.0000 mg | ORAL_TABLET | Freq: Every day | ORAL | 1 refills | Status: DC

## 2019-03-22 NOTE — Progress Notes (Signed)
Virtual Visit via Video Note  I connected with Stacy Chambers on 03/22/19 at  9:00 AM EDT by a video enabled telemedicine application and verified that I am speaking with the correct person using two identifiers.   I discussed the limitations of evaluation and management by telemedicine and the availability of in person appointments. The patient expressed understanding and agreed to proceed.   I discussed the assessment and treatment plan with the patient. The patient was provided an opportunity to ask questions and all were answered. The patient agreed with the plan and demonstrated an understanding of the instructions.   The patient was advised to call back or seek an in-person evaluation if the symptoms worsen or if the condition fails to improve as anticipated.   [place] MD OP Progress Note  03/22/2019 9:46 AM Stacy Chambers  MRN:  [id number]  Chief Complaint:  Chief Complaint    Follow-up     HPI: Stacy Chambers is a 53 year old Caucasian female, married, lives in [place], has a history of bipolar disorder, anxiety disorder, abnormal liver function test, COPD, chronic pain, RLS, was evaluated by telemedicine today.  Patient reports she has been struggling with some depressive symptoms since the past several weeks.  She reports she is also getting more paranoid recently.  She reports she is paranoid of her husband, her daughter as well as her dog.  She has been trying to cope with it.  She reports the death anniversary of her son is coming up.  He passed away 11 years ago.  She reports that may have something to do with how she feels right now.  She reports sleep is okay on melatonin and trazodone.  Patient reports she continues to take Lamictal 25 mg, she was taking 50 mg previously and reports she never got refills from [facility].  Discussed with patient that her dosage can be readjusted.  Recommended getting help for her grief as well as her recent depressive symptoms by talking to a  therapist.  She declines.  Patient denies any suicidality or homicidality.  Patient reports she had an appointment with her primary provider for her memory problems.  She reports her memory problems may be getting better.  She reports she sometimes loses her train of thoughts however since being off of the lithium is better.  Discussed with patient to limit use of medications like hydroxyzine which can also make it worse.  She agrees with plan Visit Diagnosis:    ICD-10-CM   1. Bipolar 1 disorder, mixed, moderate (HCC) F31.62 benztropine (COGENTIN) 1 MG tablet    lamoTRIgine (LAMICTAL) 25 MG tablet    traZODone (DESYREL) 100 MG tablet    ziprasidone (GEODON) 80 MG capsule    ziprasidone (GEODON) 20 MG capsule  2. GAD (generalized anxiety disorder) F41.1   3. Insomnia due to mental condition F51.05   4. Acute neuroleptic-induced akathisia G25.71    T43.505A   5. Memory loss R41.3     Past Psychiatric History: Reviewed past psychiatric history from my progress note on 10/12/2018.  Past Medical History:  Past Medical History:  Diagnosis Date  . Abnormal blood chemistry 10/03/2015  . Anginal pain ([place])   . Anxiety   . Anxiety, generalized 02/21/2015  . Bipolar affective disorder ([place]) 03/08/2013   Overview:  Last Assessment & Plan:  Continue meds, f/u with psych Restart benzo at 1 po BID prn,    . Bipolar disorder ([place])   . 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  Chronic obstructive pulmonary disease ([place]) 10/03/2015  . COPD (chronic obstructive pulmonary disease) (HCC)    no inhalers  . Elevated liver function tests 11/26/2004   H/O NEGATIVE LIVER BIOPSY, workup negative  . Gallstones 02/21/2015  . GERD (gastroesophageal reflux disease)   . Hyperlipidemia   . Insomnia   . Lower back pain   . Osteoarthritis   . Ovarian cancer ([place]) 1995  . Restless legs syndrome (RLS)   . RLS (restless legs syndrome)   . Sedative, hypnotic or anxiolytic dependence with withdrawal, 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[place])   .  Wears dentures    full upper and lower    Past Surgical History:  Procedure Laterality Date  . ABDOMINAL HYSTERECTOMY    . APPENDECTOMY    . BREAST BIOPSY Left    neg- core  . CESAREAN SECTION    . COLONOSCOPY WITH PROPOFOL N/A 05/30/2018   Procedure: COLONOSCOPY WITH PROPOFOL;  Surgeon: Manya Silvas, MD;  Location: [facility] ENDOSCOPY;  Service: Endoscopy;  Laterality: N/A;  . EXCISION MORTON'S NEUROMA Left 10/07/2016   Procedure: EXCISION MORTON'S NEUROMA;  Surgeon: [name], DPM;  Location: [facility];  Service: Podiatry;  Laterality: Left;  IV WITH LOCAL  . LIVER BIOPSY  2005   PerPt: Done to eval elevated LFTs and biopsy provided no definite dx/cause of elevated LFTs  . MULTIPLE TOOTH EXTRACTIONS    . OOPHORECTOMY      Family Psychiatric History: I have reviewed family psychiatric history from my progress note on 10/12/2018.  Family History:  Family History  Adopted: Yes  Problem Relation Age of Onset  . COPD Mother   . Depression Mother   . Colon cancer Mother        unsure of age  . Cancer Father        Lung CA, Skin Cancer--?type  . COPD Father   . Stroke Father   . Cancer Sister 60       cervical cancer  . COPD Brother   . Alcohol abuse Brother   . Stroke Brother   . COPD Maternal Aunt   . Depression Daughter   . Schizophrenia Son   . Breast cancer Neg Hx     Social History: Reviewed social history from my progress note on 10/12/2018. Social History   Socioeconomic History  . Marital status: Married    Spouse name: Not on file  . Number of children: Not on file  . Years of education: Not on file  . Highest education level: Not on file  Occupational History  . Not on file  Social Needs  . Financial resource strain: Not on file  . Food insecurity:    Worry: Not on file    Inability: Not on file  . Transportation needs:    Medical: Not on file    Non-medical: Not on file  Tobacco Use  . Smoking status: Former Smoker    Packs/day:  1.00    Years: 30.00    Pack years: 30.00    Types: Cigarettes    Last attempt to quit: 04/08/2009    Years since quitting: 9.9  . Smokeless tobacco: Never Used  Substance and Sexual Activity  . Alcohol use: Yes    Alcohol/week: 3.0 - 7.0 standard drinks    Types: 3 - 7 Shots of liquor per week    Comment: occasionally  . Drug use: Yes    Types: Marijuana    Comment: told to refrain until after surgery  . Sexual activity:  Yes    Birth control/protection: Surgical  Lifestyle  . Physical activity:    Days per week: Not on file    Minutes per session: Not on file  . Stress: Not on file  Relationships  . Social connections:    Talks on phone: Not on file    Gets together: Not on file    Attends religious service: Not on file    Active member of club or organization: Not on file    Attends meetings of clubs or organizations: Not on file    Relationship status: Not on file  Other Topics Concern  . Not on file  Social History Narrative  . Not on file    Allergies:  Allergies  Allergen Reactions  . Benadryl [Diphenhydramine Hcl]     Hives/ rapid heartrate  . Codeine   . Vicodin [Hydrocodone-Acetaminophen] Other (See Comments)    dizziness    Metabolic Disorder Labs: Lab Results  Component Value Date   HGBA1C 5.6 09/14/2018   MPG 114 09/14/2018   No results found for: PROLACTIN Lab Results  Component Value Date   CHOL 139 02/01/2019   TRIG 166 (H) 02/01/2019   HDL 36 (L) 02/01/2019   CHOLHDL 3.9 02/01/2019   VLDL 18 03/04/2015   LDLCALC 77 02/01/2019   LDLCALC 84 09/14/2018   Lab Results  Component Value Date   TSH 2.320 10/24/2018   TSH 2.00 10/21/2018    Therapeutic Level Labs: Lab Results  Component Value Date   LITHIUM 0.3 (L) 10/24/2018   LITHIUM 0.49 (L) 01/13/2009   No results found for: VALPROATE No components found for:  CBMZ  Current Medications: Current Outpatient Medications  Medication Sig Dispense Refill  . alendronate (FOSAMAX) 70  MG tablet Take 1 tablet (70 mg total) by mouth once a week. Take with a full glass of water on an empty stomach. 12 tablet 3  . ammonium lactate (AMLACTIN) 12 % cream Apply topically as needed for dry skin. 385 g 1  . benztropine (COGENTIN) 1 MG tablet Take 1 tablet (1 mg total) by mouth 3 (three) times daily. For side effects of geodon 270 tablet 1  . clonazePAM (KLONOPIN) 0.5 MG tablet Take 1 tablet (0.5 mg total) by mouth as directed. Only for severe anxiety attacks 1-2 times per week 15 tablet 0  . esomeprazole (NEXIUM) 20 MG capsule Take 1 capsule (20 mg total) by mouth daily as needed (take on empty stomach). 90 capsule 1  . fluticasone (FLOVENT HFA) 220 MCG/ACT inhaler Inhale into the lungs 2 (two) times daily.    . hydrOXYzine (ATARAX/VISTARIL) 25 MG tablet TAKE 1 TABLET THREE TIMES DAILY AS NEEDED FOR SEVERE ANXIETY SYMPTOMS 270 tablet 1  . lamoTRIgine (LAMICTAL) 25 MG tablet Take 3 tablets (75 mg total) by mouth daily. 270 tablet 0  . lubiprostone (AMITIZA) 24 MCG capsule Take by mouth.    . meloxicam (MOBIC) 7.5 MG tablet Take 1 tablet (7.5 mg total) by mouth 2 (two) times daily as needed for pain. 180 tablet 0  . rosuvastatin (CRESTOR) 10 MG tablet TAKE 1 TABLET EVERY DAY 90 tablet 0  . traZODone (DESYREL) 100 MG tablet Take 1 tablet (100 mg total) by mouth at bedtime. 90 tablet 1  . ziprasidone (GEODON) 20 MG capsule Take 1 capsule (20 mg total) by mouth daily with supper. To be combined with 80 mg 90 capsule 1  . ziprasidone (GEODON) 80 MG capsule Take 1 capsule (80 mg total) by mouth daily  with supper. To be combined with 20 mg 90 capsule 1   No current facility-administered medications for this visit.      Musculoskeletal: Strength & Muscle Tone: within normal limits Gait & Station: normal Patient leans: N/A  Psychiatric Specialty Exam: Review of Systems  Psychiatric/Behavioral: Positive for depression and memory loss. The patient is nervous/anxious.   All other systems  reviewed and are negative.   There were no vitals taken for this visit.There is no height or weight on file to calculate BMI.  General Appearance: Casual  Eye Contact:  Fair  Speech:  Clear and Coherent  Volume:  Normal  Mood:  Anxious and Depressed  Affect:  Congruent  Thought Process:  Goal Directed and Descriptions of Associations: Intact  Orientation:  Full (Time, Place, and Person)  Thought Content: Paranoid Ideation   Suicidal Thoughts:  No  Homicidal Thoughts:  No  Memory:  Immediate;   Fair Recent;   Fair Remote;   Fair  Judgement:  Fair  Insight:  Fair  Psychomotor Activity:  Normal  Concentration:  Concentration: Fair and Attention Span: Fair  Recall:  [employer] of Knowledge: Fair  Language: Fair  Akathisia:  No  Handed:  Right  AIMS (if indicated): denies tremors, rigidity,stiffness  Assets:  Communication Skills Desire for Improvement Social Support  ADL's:  Intact  Cognition: WNL, however reports some memory problems on and off like losing her train of thoughts -but states its improving  Sleep:  Fair   Screenings: AIMS     Office Visit from 10/24/2018 in [place] Office Visit from 04/04/2018 in [place] Office Visit from 01/03/2018 in [place] Office Visit from 11/18/2016 in [place] Total Score  5  0  0  0    PHQ2-9     Office Visit from 10/21/2018 in [place] Visit from 09/29/2018 in [place] Visit from 09/14/2018 in [place] Office Visit from 04/27/2018 in [place]  PHQ-2 Total Score  4  4  5  5   PHQ-9 Total Score  21  21  19  20        Assessment and Plan: Stacy Chambers is a 53 year old Caucasian female, married, lives in [place], is a history of bipolar disorder, anxiety disorder, COPD, RLS, elevated liver enzymes was evaluated by  telemedicine today.  Patient with psychosocial stressors of the death of her son who passed away 11 years ago, relationship struggles with her husband and other children.  She is biologically predisposed given her family history of mental health problems.  Patient continues to struggle with depressive symptoms as well as paranoia and memory changes.  Will continue to make medication changes.  Plan Bipolar disorder-unstable Increase Geodon to 100 mg p.o. daily with supper. Continue 1 mg p.o. 3 times daily for EPS Increase lamotrigine to 75 mg p.o. daily. Discussed a referral to psychotherapist however she declined.  For GAD- some improvement Klonopin 0.5 mg p.o. daily PRN for severe anxiety attacks only.  The last time it was filled was in March for 8 pills. Hydroxyzine 25 mg p.o. 3 times daily PRN. Discussed with patient to limit use of both medications since she also has memory issues.  For acute neuroleptic induced akathisia-improved. Cogentin 1 mg p.o. 3 times daily.  For insomnia-improving Melatonin 3 to 5 mg p.o. nightly Trazodone 100 mg p.o. nightly  For memory  loss-improving She reports memory problems is improving since being off of the lithium. I have reviewed medical records from her family medicine provider-Stacy Chambers Edward PA -01/31/2019- " she has been referred for to neurology for further management."  Follow up in clinic in 2 weeks or sooner if needed.  June 11 at 2:15 PM  I have spent atleast 25 minutes non face to face with patient today. More than 50 % of the time was spent for psychoeducation and supportive psychotherapy and care coordination.   This note was generated in part or whole with voice recognition software. Voice recognition is usually quite accurate but there are transcription errors that can and very often do occur. I apologize for any typographical errors that were not detected and corrected.        Ursula Alert, MD 03/22/2019, 9:46 AM

## 2019-03-27 ENCOUNTER — Other Ambulatory Visit: Payer: Self-pay | Admitting: Family Medicine

## 2019-04-01 ENCOUNTER — Other Ambulatory Visit: Payer: Self-pay | Admitting: Family Medicine

## 2019-04-04 ENCOUNTER — Other Ambulatory Visit: Payer: Medicare HMO

## 2019-04-06 ENCOUNTER — Ambulatory Visit (INDEPENDENT_AMBULATORY_CARE_PROVIDER_SITE_OTHER): Payer: Medicare HMO | Admitting: Psychiatry

## 2019-04-06 ENCOUNTER — Encounter: Payer: Self-pay | Admitting: Psychiatry

## 2019-04-06 ENCOUNTER — Other Ambulatory Visit: Payer: Self-pay

## 2019-04-06 DIAGNOSIS — G2571 Drug induced akathisia: Secondary | ICD-10-CM | POA: Diagnosis not present

## 2019-04-06 DIAGNOSIS — F411 Generalized anxiety disorder: Secondary | ICD-10-CM

## 2019-04-06 DIAGNOSIS — T43505A Adverse effect of unspecified antipsychotics and neuroleptics, initial encounter: Secondary | ICD-10-CM | POA: Diagnosis not present

## 2019-04-06 DIAGNOSIS — R413 Other amnesia: Secondary | ICD-10-CM | POA: Diagnosis not present

## 2019-04-06 DIAGNOSIS — F3162 Bipolar disorder, current episode mixed, moderate: Secondary | ICD-10-CM

## 2019-04-06 DIAGNOSIS — F5105 Insomnia due to other mental disorder: Secondary | ICD-10-CM | POA: Diagnosis not present

## 2019-04-06 NOTE — Progress Notes (Signed)
Virtual Visit via Video Note  I connected with [name] on 04/06/19 at  2:15 PM EDT by a video enabled telemedicine application and verified that I am speaking with the correct person using two identifiers.   I discussed the limitations of evaluation and management by telemedicine and the availability of in person appointments. The patient expressed understanding and agreed to proceed.   I discussed the assessment and treatment plan with the patient. The patient was provided an opportunity to ask questions and all were answered. The patient agreed with the plan and demonstrated an understanding of the instructions.   The patient was advised to call back or seek an in-person evaluation if the symptoms worsen or if the condition fails to improve as anticipated.   [facility] MD OP Progress Note  04/06/2019 2:06 PM NEKESHA FONT  MRN:  [id number]  Chief Complaint:  Chief Complaint    Follow-up     HPI: Mallorey is a 53 year old Caucasian female, married, lives in [place], has a history of bipolar disorder, GAD, insomnia, acute neuroleptic induced akathisia, memory loss, RLS, COPD, chronic pain, abnormal liver function test was evaluated by telemedicine today.  She today reports she has been doing okay.  Her mood seems to be getting better.  She however continues to hear voices.  She reports hearing music.  She also reports seeing shadows.  She however reports this has been going on for a long time.  Her Geodon was increased last visit to address her hallucinations.  She however reports it gave her muscle spasms and tremors and she hence went down to an 80 mg.  She reports she wants to stay on that dosage at this time.  Discussed about changing the Geodon to another antipsychotic medication if her hallucinations are distressing to her.  Patient however declines.  She continues to take lamotrigine as prescribed.  She reports she went up on the dosage to 75 mg 2 days ago.  Patient denies any  suicidality, homicidality or perceptual disturbances.  Patient denies any other concerns today. Visit Diagnosis:    ICD-10-CM   1. Bipolar 1 disorder, mixed, moderate (HCC)  F31.62   2. GAD (generalized anxiety disorder)  F41.1   3. Insomnia due to mental condition  F51.05   4. Acute neuroleptic-induced akathisia  G25.71    T43.505A   5. Memory loss  R41.3     Past Psychiatric History: I have reviewed past psychiatric history from my progress note on 10/12/2018.  Past Medical History:  Past Medical History:  Diagnosis Date  . Abnormal blood chemistry 10/03/2015  . Anginal pain ([place])   . Anxiety   . Anxiety, generalized 02/21/2015  . Bipolar affective disorder ([place]) 03/08/2013   Overview:  Last Assessment & Plan:  Continue meds, f/u with psych Restart benzo at 1 po BID prn,    . Bipolar disorder ([place])   . 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 Chronic obstructive pulmonary disease ([place]) 10/03/2015  . COPD (chronic obstructive pulmonary disease) (HCC)    no inhalers  . Elevated liver function tests 11/26/2004   H/O NEGATIVE LIVER BIOPSY, workup negative  . Gallstones 02/21/2015  . GERD (gastroesophageal reflux disease)   . Hyperlipidemia   . Insomnia   . Lower back pain   . Osteoarthritis   . Ovarian cancer ([place]) 1995  . Restless legs syndrome (RLS)   . RLS (restless legs syndrome)   . Sedative, hypnotic or anxiolytic dependence with withdrawal, 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[place])   .  Wears dentures    full upper and lower    Past Surgical History:  Procedure Laterality Date  . ABDOMINAL HYSTERECTOMY    . APPENDECTOMY    . BREAST BIOPSY Left    neg- core  . CESAREAN SECTION    . COLONOSCOPY WITH PROPOFOL N/A 05/30/2018   Procedure: COLONOSCOPY WITH PROPOFOL;  Surgeon: Manya Silvas, MD;  Location: [facility]COPY;  Service: Endoscopy;  Laterality: N/A;  . EXCISION MORTON'S NEUROMA Left 10/07/2016   Procedure: EXCISION MORTON'S NEUROMA;  Surgeon: [name], DPM;  Location: [facility];  Service: Podiatry;  Laterality: Left;  IV WITH LOCAL  . LIVER BIOPSY  2005   PerPt: Done to eval elevated LFTs and biopsy provided no definite dx/cause of elevated LFTs  . MULTIPLE TOOTH EXTRACTIONS    . OOPHORECTOMY      Family Psychiatric History: I have reviewed family psychiatric history from my progress note on 10/12/2018.  Family History:  Family History  Adopted: Yes  Problem Relation Age of Onset  . COPD Mother   . Depression Mother   . Colon cancer Mother        unsure of age  . Cancer Father        Lung CA, Skin Cancer--?type  . COPD Father   . Stroke Father   . Cancer Sister 33       cervical cancer  . COPD Brother   . Alcohol abuse Brother   . Stroke Brother   . COPD Maternal Aunt   . Depression Daughter   . Schizophrenia Son   . Breast cancer Neg Hx      Social History: Reviewed social history from my progress note on 10/12/2018. Social History   Socioeconomic History  . Marital status: Married    Spouse name: Not on file  . Number of children: Not on file  . Years of education: Not on file  . Highest education level: Not on file  Occupational History  . Not on file  Social Needs  . Financial resource strain: Not on file  . Food insecurity    Worry: Not on file    Inability: Not on file  . Transportation needs    Medical: Not on file    Non-medical: Not on file  Tobacco Use  . Smoking status: Former Smoker    Packs/day: 1.00    Years: 30.00    Pack years: 30.00    Types: Cigarettes    Quit date: 04/08/2009    Years since quitting: 10.0  . Smokeless tobacco: Never Used  Substance and Sexual Activity  . Alcohol use: Yes    Alcohol/week: 3.0 - 7.0 standard drinks    Types: 3 - 7 Shots of liquor per week    Comment: occasionally  . Drug use: Yes    Types: Marijuana    Comment: told to refrain until after surgery  . Sexual activity: Yes    Birth control/protection: Surgical  Lifestyle  . Physical activity    Days per week: Not on  file    Minutes per session: Not on file  . Stress: Not on file  Relationships  . Social Herbalist on phone: Not on file    Gets together: Not on file    Attends religious service: Not on file    Active member of club or organization: Not on file    Attends meetings of clubs or organizations: Not on file    Relationship status:  Not on file  Other Topics Concern  . Not on file  Social History Narrative  . Not on file    Allergies:  Allergies  Allergen Reactions  . Benadryl [Diphenhydramine Hcl]     Hives/ rapid heartrate  . Codeine   . Vicodin [Hydrocodone-Acetaminophen] Other (See Comments)    dizziness    Metabolic Disorder Labs: Lab Results  Component Value Date   HGBA1C 5.6 09/14/2018   MPG 114 09/14/2018   No results found for: PROLACTIN Lab Results  Component Value Date   CHOL 139 02/01/2019   TRIG 166 (H) 02/01/2019   HDL 36 (L) 02/01/2019   CHOLHDL 3.9 02/01/2019   VLDL 18 03/04/2015   LDLCALC 77 02/01/2019   LDLCALC 84 09/14/2018   Lab Results  Component Value Date   TSH 2.320 10/24/2018   TSH 2.00 10/21/2018    Therapeutic Level Labs: Lab Results  Component Value Date   LITHIUM 0.3 (L) 10/24/2018   LITHIUM 0.49 (L) 01/13/2009   No results found for: VALPROATE No components found for:  CBMZ  Current Medications: Current Outpatient Medications  Medication Sig Dispense Refill  . alendronate (FOSAMAX) 70 MG tablet Take 1 tablet (70 mg total) by mouth once a week. Take with a full glass of water on an empty stomach. 12 tablet 3  . ammonium lactate (AMLACTIN) 12 % cream APPLY TOPICALLY AS NEEDED FOR DRY SKIN. 385 g 0  . benztropine (COGENTIN) 1 MG tablet Take 1 tablet (1 mg total) by mouth 3 (three) times daily. For side effects of geodon 270 tablet 1  . clonazePAM (KLONOPIN) 0.5 MG tablet Take 1 tablet (0.5 mg total) by mouth as directed. Only for severe anxiety attacks 1-2 times per week 15 tablet 0  . esomeprazole (NEXIUM) 20 MG  capsule Take 1 capsule (20 mg total) by mouth daily as needed (take on empty stomach). 90 capsule 1  . fluticasone (FLOVENT HFA) 220 MCG/ACT inhaler Inhale into the lungs 2 (two) times daily.    . hydrOXYzine (ATARAX/VISTARIL) 25 MG tablet TAKE 1 TABLET THREE TIMES DAILY AS NEEDED FOR SEVERE ANXIETY SYMPTOMS 270 tablet 1  . lamoTRIgine (LAMICTAL) 25 MG tablet Take 3 tablets (75 mg total) by mouth daily. 270 tablet 0  . lubiprostone (AMITIZA) 24 MCG capsule Take by mouth.    . meloxicam (MOBIC) 7.5 MG tablet Take 1 tablet (7.5 mg total) by mouth 2 (two) times daily as needed for pain. 180 tablet 0  . rosuvastatin (CRESTOR) 10 MG tablet TAKE 1 TABLET EVERY DAY 90 tablet 0  . traZODone (DESYREL) 100 MG tablet Take 1 tablet (100 mg total) by mouth at bedtime. 90 tablet 1  . ziprasidone (GEODON) 80 MG capsule Take 1 capsule (80 mg total) by mouth daily with supper. To be combined with 20 mg 90 capsule 1   No current facility-administered medications for this visit.      Musculoskeletal: Strength & Muscle Tone: within normal limits Gait & Station: normal Patient leans: N/A  Psychiatric Specialty Exam: Review of Systems  Psychiatric/Behavioral: The patient is nervous/anxious.   All other systems reviewed and are negative.   There were no vitals taken for this visit.There is no height or weight on file to calculate BMI.  General Appearance: Casual  Eye Contact:  Fair  Speech:  Normal Rate  Volume:  Normal  Mood:  Anxious  Affect:  Congruent  Thought Process:  Goal Directed and Descriptions of Associations: Intact  Orientation:  Full (Time, Place,  and Person)  Thought Content: Logical   Suicidal Thoughts:  No  Homicidal Thoughts:  No  Memory:  Immediate;   Fair Recent;   Fair Remote;   Fair  Judgement:  Fair  Insight:  Fair  Psychomotor Activity:  Normal  Concentration:  Concentration: Fair and Attention Span: Fair  Recall:  [employer] of Knowledge: Fair  Language: Fair   Akathisia:  No  Handed:  Right  AIMS (if indicated): denies stiffness, rigidity  Assets:  Communication Skills Desire for Improvement Housing Social Support  ADL's:  Intact  Cognition: WNL  Sleep:  Fair   Screenings: AIMS     Office Visit from 10/24/2018 in [facility] Visit from 04/04/2018 in [facility] Visit from 01/03/2018 in [facility] Visit from 11/18/2016 in [facility] Total Score  5  0  0  0    PHQ2-9     Office Visit from 10/21/2018 in [facility] Visit from 09/29/2018 in [facility] Visit from 09/14/2018 in [facility] Visit from 04/27/2018 in [facility]  PHQ-2 Total Score  4  4  5  5   PHQ-9 Total Score  21  21  19  20        Assessment and Plan: Shaili is a 53 year old Caucasian female, married, lives in [place], has a history of bipolar disorder, anxiety disorder, COPD, RLS, elevated liver enzyme was evaluated by telemedicine today.  Patient with psychosocial stressors of the death of her son who passed away 11 years ago, relationship struggles with her husband and other children.  She is biologically predisposed given her family history of mental health problems.  Patient currently describes hallucinations , reports they are chronic and she is able to cope ,her mood symptoms are improving.  Patient declines medication readjustment.  Plan as noted below.  Plan Bipolar disorder-improving Geodon 80 mg p.o. daily with supper.  She did not tolerate the 100 mg. Cogentin 1 mg p.o. 3 times daily for EPS Lamictal 75 mg p.o. daily  For GAD- improving Hydroxyzine 25 mg p.o. 3 times daily as needed She was prescribed Klonopin as needed in March-she rarely uses it.  For acute neuroleptic induced akathisia- improving Cogentin 1 mg p.o. 3 times  daily  For insomnia-improving Melatonin 3 to 5 mg p.o. nightly Trazodone 100 mg p.o. nightly.  For memory loss-improving We will continue to monitor closely.  She has been referred for neurology for further management.  Follow-up in clinic in 2 months or sooner if needed.  August 12 at 2 PM.  I have spent atleast 15 minutes non face to face with patient today. More than 50 % of the time was spent for psychoeducation and supportive psychotherapy and care coordination.  This note was generated in part or whole with voice recognition software. Voice recognition is usually quite accurate but there are transcription errors that can and very often do occur. I apologize for any typographical errors that were not detected and corrected.       Ursula Alert, MD 04/06/2019, 2:06 PM

## 2019-04-20 ENCOUNTER — Other Ambulatory Visit: Payer: Self-pay | Admitting: Family Medicine

## 2019-04-20 NOTE — Telephone Encounter (Signed)
Requested Prescriptions   Pending Prescriptions Disp Refills  . meloxicam (MOBIC) 7.5 MG tablet [Pharmacy Med Name: MELOXICAM 7.5 MG Tablet] 180 tablet 0    Sig: TAKE 1 TABLET TWICE DAILY AS NEEDED FOR PAIN   Last OV 01/31/2019 Last written 01/31/2019

## 2019-04-30 ENCOUNTER — Other Ambulatory Visit: Payer: Self-pay | Admitting: Psychiatry

## 2019-04-30 DIAGNOSIS — F3162 Bipolar disorder, current episode mixed, moderate: Secondary | ICD-10-CM

## 2019-05-04 ENCOUNTER — Other Ambulatory Visit: Payer: Self-pay | Admitting: Family Medicine

## 2019-05-15 ENCOUNTER — Ambulatory Visit
Admission: RE | Admit: 2019-05-15 | Discharge: 2019-05-15 | Disposition: A | Discharge status: Home / Self Care | Payer: Medicare HMO | Source: Ambulatory Visit | Attending: Family Medicine | Admitting: Family Medicine

## 2019-05-15 DIAGNOSIS — N6312 Unspecified lump in the right breast, upper inner quadrant: Secondary | ICD-10-CM | POA: Insufficient documentation

## 2019-05-15 DIAGNOSIS — N6310 Unspecified lump in the right breast, unspecified quadrant: Secondary | ICD-10-CM | POA: Diagnosis present

## 2019-05-15 DIAGNOSIS — R922 Inconclusive mammogram: Secondary | ICD-10-CM | POA: Diagnosis not present

## 2019-05-17 ENCOUNTER — Other Ambulatory Visit: Payer: Self-pay | Admitting: Psychiatry

## 2019-05-17 DIAGNOSIS — F3162 Bipolar disorder, current episode mixed, moderate: Secondary | ICD-10-CM

## 2019-05-24 ENCOUNTER — Other Ambulatory Visit: Payer: Self-pay | Admitting: Family Medicine

## 2019-06-07 ENCOUNTER — Encounter: Payer: Self-pay | Admitting: Psychiatry

## 2019-06-07 ENCOUNTER — Ambulatory Visit (HOSPITAL_BASED_OUTPATIENT_CLINIC_OR_DEPARTMENT_OTHER): Payer: Medicare HMO | Admitting: Psychiatry

## 2019-06-07 ENCOUNTER — Other Ambulatory Visit: Payer: Self-pay

## 2019-06-07 DIAGNOSIS — G2571 Drug induced akathisia: Secondary | ICD-10-CM

## 2019-06-07 DIAGNOSIS — F5105 Insomnia due to other mental disorder: Secondary | ICD-10-CM

## 2019-06-07 DIAGNOSIS — F3162 Bipolar disorder, current episode mixed, moderate: Secondary | ICD-10-CM | POA: Diagnosis not present

## 2019-06-07 DIAGNOSIS — T43505A Adverse effect of unspecified antipsychotics and neuroleptics, initial encounter: Secondary | ICD-10-CM | POA: Diagnosis not present

## 2019-06-07 DIAGNOSIS — R413 Other amnesia: Secondary | ICD-10-CM | POA: Diagnosis not present

## 2019-06-07 DIAGNOSIS — F411 Generalized anxiety disorder: Secondary | ICD-10-CM

## 2019-06-07 HISTORY — DX: Other amnesia: R41.3

## 2019-06-07 MED ORDER — LAMOTRIGINE 100 MG PO TABS: 100.0000 mg | ORAL_TABLET | Freq: Every day | ORAL | 0 refills | Status: DC

## 2019-06-07 MED ORDER — HYDROXYZINE HCL 50 MG PO TABS: 50.0000 mg | ORAL_TABLET | Freq: Two times a day (BID) | ORAL | 0 refills | Status: DC | PRN

## 2019-06-07 MED ORDER — ESCITALOPRAM OXALATE 5 MG PO TABS: 5.0000 mg | ORAL_TABLET | Freq: Every day | ORAL | 0 refills | Status: DC

## 2019-06-07 NOTE — Progress Notes (Signed)
Virtual Visit via Video Note  I connected with Stacy Chambers on 06/07/19 at  2:00 PM EDT by a video enabled telemedicine application and verified that I am speaking with the correct person using two identifiers.   I discussed the limitations of evaluation and management by telemedicine and the availability of in person appointments. The patient expressed understanding and agreed to proceed.   I discussed the assessment and treatment plan with the patient. The patient was provided an opportunity to ask questions and all were answered. The patient agreed with the plan and demonstrated an understanding of the instructions.   The patient was advised to call back or seek an in-person evaluation if the symptoms worsen or if the condition fails to improve as anticipated.   [facility] MD OP Progress Note  06/07/2019 3:15 PM Stacy Chambers  MRN:  [id number]  Chief Complaint:  Chief Complaint    Follow-up; Anxiety     HPI: Stacy Chambers is a 53 year old Caucasian female, married, lives in [place], has a history of bipolar disorder, GAD, insomnia, acute neuroleptic induced akathisia, memory loss, RLS, COPD, chronic pain, abnormal liver function test, was evaluated by telemedicine today.  Patient today reports she is currently struggling with a lot of anxiety symptoms.  She reports she feels restless and on edge all the time.  She reports sleep is fair.  Patient continues to hear voices-music.  She also has a history of seeing shadows.  She has been coping with it okay.  She continues to take Stacy Chambers as prescribed.  She denies any side effects at this time.  Patient denies any suicidality, homicidality.  She reports she was able to see her granddaughter who is 66 years old yesterday.  The visit went well.  She reports she was excited to see her and had a good time.  Patient denies any other concerns today. Visit Diagnosis:    ICD-10-CM   1. Bipolar 1 disorder, mixed, moderate (HCC)  F31.62 lamoTRIgine  (LAMICTAL) 100 MG tablet  2. GAD (generalized anxiety disorder)  F41.1 hydrOXYzine (ATARAX/VISTARIL) 50 MG tablet    escitalopram (LEXAPRO) 5 MG tablet  3. Insomnia due to mental condition  F51.05   4. Acute neuroleptic-induced akathisia  G25.71    T43.505A   5. Memory loss  R41.3     Past Psychiatric History: I have reviewed past psychiatric history from my progress note on 10/12/2018.  Past trials of Zoloft, BuSpar, carbamazepine.  Past Medical History:  Past Medical History:  Diagnosis Date  . Abnormal blood chemistry 10/03/2015  . Anginal pain ([place] [employer])   . Anxiety   . Anxiety, generalized 02/21/2015  . Bipolar affective disorder ([place]) 03/08/2013   Overview:  Last Assessment & Plan:  Continue meds, f/u with psych Restart benzo at 1 po BID prn,    . Bipolar disorder ([place])   . 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 Chronic obstructive pulmonary disease ([place]) 10/03/2015  . COPD (chronic obstructive pulmonary disease) (HCC)    no inhalers  . Elevated liver function tests 11/26/2004   H/O NEGATIVE LIVER BIOPSY, workup negative  . Gallstones 02/21/2015  . GERD (gastroesophageal reflux disease)   . Hyperlipidemia   . Insomnia   . Lower back pain   . Osteoarthritis   . Ovarian cancer ([place]) 1995  . Restless legs syndrome (RLS)   . RLS (restless legs syndrome)   . Sedative, hypnotic or anxiolytic dependence with withdrawal, 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[place])   . Wears dentures    full  upper and lower    Past Surgical History:  Procedure Laterality Date  . ABDOMINAL HYSTERECTOMY    . APPENDECTOMY    . BREAST BIOPSY Left    neg- core  . CESAREAN SECTION    . COLONOSCOPY WITH PROPOFOL N/A 05/30/2018   Procedure: COLONOSCOPY WITH PROPOFOL;  Surgeon: Manya Silvas, MD;  Location: [facility] ENDOSCOPY;  Service: Endoscopy;  Laterality: N/A;  . EXCISION MORTON'S NEUROMA Left 10/07/2016   Procedure: EXCISION MORTON'S NEUROMA;  Surgeon: [name], DPM;  Location: [facility];  Service: Podiatry;  Laterality:  Left;  IV WITH LOCAL  . LIVER BIOPSY  2005   PerPt: Done to eval elevated LFTs and biopsy provided no definite dx/cause of elevated LFTs  . MULTIPLE TOOTH EXTRACTIONS    . OOPHORECTOMY      Family Psychiatric History: I have reviewed family psychiatric history from my progress note on 10/12/2018.  Family History:  Family History  Adopted: Yes  Problem Relation Age of Onset  . COPD Mother   . Depression Mother   . Colon cancer Mother        unsure of age  . Cancer Father        Lung CA, Skin Cancer--?type  . COPD Father   . Stroke Father   . Cancer Sister 37       cervical cancer  . COPD Brother   . Alcohol abuse Brother   . Stroke Brother   . COPD Maternal Aunt   . Depression Daughter   . Schizophrenia Son   . Breast cancer Neg Hx     Social History: I have reviewed social history from my progress note on 10/12/2018. Social History   Socioeconomic History  . Marital status: Married    Spouse name: Not on file  . Number of children: Not on file  . Years of education: Not on file  . Highest education level: Not on file  Occupational History  . Not on file  Social Needs  . Financial resource strain: Not on file  . Food insecurity    Worry: Not on file    Inability: Not on file  . Transportation needs    Medical: Not on file    Non-medical: Not on file  Tobacco Use  . Smoking status: Former Smoker    Packs/day: 1.00    Years: 30.00    Pack years: 30.00    Types: Cigarettes    Quit date: 04/08/2009    Years since quitting: 10.1  . Smokeless tobacco: Never Used  Substance and Sexual Activity  . Alcohol use: Yes    Alcohol/week: 3.0 - 7.0 standard drinks    Types: 3 - 7 Shots of liquor per week    Comment: occasionally  . Drug use: Yes    Types: Marijuana    Comment: told to refrain until after surgery  . Sexual activity: Yes    Birth control/protection: Surgical  Lifestyle  . Physical activity    Days per week: Not on file    Minutes per session: Not  on file  . Stress: Not on file  Relationships  . Social Herbalist on phone: Not on file    Gets together: Not on file    Attends religious service: Not on file    Active member of club or organization: Not on file    Attends meetings of clubs or organizations: Not on file    Relationship status: Not on file  Other  Topics Concern  . Not on file  Social History Narrative  . Not on file    Allergies:  Allergies  Allergen Reactions  . Benadryl [Diphenhydramine Hcl]     Hives/ rapid heartrate  . Codeine   . Vicodin [Hydrocodone-Acetaminophen] Other (See Comments)    dizziness    Metabolic Disorder Labs: Lab Results  Component Value Date   HGBA1C 5.6 09/14/2018   MPG 114 09/14/2018   No results found for: PROLACTIN Lab Results  Component Value Date   CHOL 139 02/01/2019   TRIG 166 (H) 02/01/2019   HDL 36 (L) 02/01/2019   CHOLHDL 3.9 02/01/2019   VLDL 18 03/04/2015   LDLCALC 77 02/01/2019   LDLCALC 84 09/14/2018   Lab Results  Component Value Date   TSH 2.320 10/24/2018   TSH 2.00 10/21/2018    Therapeutic Level Labs: Lab Results  Component Value Date   LITHIUM 0.3 (L) 10/24/2018   LITHIUM 0.49 (L) 01/13/2009   No results found for: VALPROATE No components found for:  CBMZ  Current Medications: Current Outpatient Medications  Medication Sig Dispense Refill  . alendronate (FOSAMAX) 70 MG tablet Take 1 tablet (70 mg total) by mouth once a week. Take with a full glass of water on an empty stomach. 12 tablet 3  . ammonium lactate (AMLACTIN) 12 % cream APPLY TOPICALLY AS NEEDED FOR DRY SKIN. 385 g 0  . benztropine (COGENTIN) 1 MG tablet Take 1 tablet (1 mg total) by mouth 3 (three) times daily. For side effects of Stacy Chambers 270 tablet 1  . clonazePAM (KLONOPIN) 0.5 MG tablet Take 1 tablet (0.5 mg total) by mouth as directed. Only for severe anxiety attacks 1-2 times per week 15 tablet 0  . escitalopram (LEXAPRO) 5 MG tablet Take 1 tablet (5 mg total) by  mouth daily. 90 tablet 0  . esomeprazole (NEXIUM) 20 MG capsule Take 1 capsule (20 mg total) by mouth daily as needed (take on empty stomach). 90 capsule 1  . fluticasone (FLOVENT HFA) 220 MCG/ACT inhaler Inhale into the lungs 2 (two) times daily.    . hydrOXYzine (ATARAX/VISTARIL) 50 MG tablet Take 1 tablet (50 mg total) by mouth 2 (two) times daily as needed. For severe anxiety attacks 180 tablet 0  . lamoTRIgine (LAMICTAL) 100 MG tablet Take 1 tablet (100 mg total) by mouth daily. 90 tablet 0  . lubiprostone (AMITIZA) 24 MCG capsule Take by mouth.    . meloxicam (MOBIC) 7.5 MG tablet TAKE 1 TABLET TWICE DAILY AS NEEDED FOR PAIN 180 tablet 0  . rosuvastatin (CRESTOR) 10 MG tablet TAKE 1 TABLET EVERY DAY 90 tablet 0  . traZODone (DESYREL) 100 MG tablet Take 1 tablet (100 mg total) by mouth at bedtime. 90 tablet 1  . ziprasidone (Stacy Chambers) 80 MG capsule Take 1 capsule (80 mg total) by mouth daily with supper. To be combined with 20 mg 90 capsule 1   No current facility-administered medications for this visit.      Musculoskeletal: Strength & Muscle Tone: UTA Gait & Station: normal Patient leans: N/A  Psychiatric Specialty Exam: Review of Systems  Psychiatric/Behavioral: The patient is nervous/anxious.   All other systems reviewed and are negative.   There were no vitals taken for this visit.There is no height or weight on file to calculate BMI.  General Appearance: Casual  Eye Contact:  Fair  Speech:  Clear and Coherent  Volume:  Normal  Mood:  Anxious  Affect:  Congruent  Thought Process:  Goal  Directed and Descriptions of Associations: Intact  Orientation:  Full (Time, Place, and Person)  Thought Content: Logical hx of AH and VH - coping well.  Suicidal Thoughts:  No  Homicidal Thoughts:  No  Memory:  Immediate;   Fair Recent;   Fair Remote;   Fair  Judgement:  Fair  Insight:  Fair  Psychomotor Activity:  Normal  Concentration:  Concentration: Fair and Attention Span: Fair   Recall:  [employer] of Knowledge: Fair  Language: Fair  Akathisia:  No  Handed:  Right  AIMS (if indicated): Denies tremors, rigidity  Assets:  Communication Skills Desire for Improvement Social Support  ADL's:  Intact  Cognition: WNL  Sleep:  Fair   Screenings: AIMS     Office Visit from 10/24/2018 in [facility] Office Visit from 04/04/2018 in [facility] Office Visit from 01/03/2018 in [facility] Office Visit from 11/18/2016 in [facility] Total Score  5  0  0  0    PHQ2-9     Office Visit from 10/21/2018 in [facility] Visit from 09/29/2018 in [facility] Visit from 09/14/2018 in [facility] Visit from 04/27/2018 in [facility]  PHQ-2 Total Score  4  4  5  5   PHQ-9 Total Score  21  21  19  20        Assessment and Plan: Juliette is a 54 year old Caucasian female, married, lives in [place], has a history of bipolar disorder, anxiety disorder, COPD, RLS, elevated liver enzyme was evaluated by telemedicine today.  Patient with psychosocial stressors of the death of her son who passed away 11 years ago, relationship struggles with her husband and other children.  Patient is biologically predisposed given her family history of mental health problems.  She continues to struggle with anxiety symptoms and will benefit from medication readjustment.  Plan For bipolar disorder- improving Stacy Chambers 80 mg p.o. daily with supper.  She did not tolerate the higher dosage. Cogentin 1 mg p.o. 3 times daily for EPS Increase lamotrigine to 100 mg p.o. daily  For GAD-unstable Lamotrigine has been readjusted as summarized above. Increase hydroxyzine to 50 mg p.o. twice daily as needed Add Lexapro 5 mg p.o. daily  For acute neuroleptic induced akathisia- stable Cogentin 1 mg p.o. 3 times  daily  For insomnia-improving Melatonin 3 to 5 mg p.o. nightly  For memory loss-improving We will continue to monitor closely. She was referred for neurology evaluation for further management  Follow-up in clinic in 1 month or sooner if needed.  September 30 at 10:15 AM  I have spent atleast 15 minutes non face to face with patient today. More than 50 % of the time was spent for psychoeducation and supportive psychotherapy and care coordination.  This note was generated in part or whole with voice recognition software. Voice recognition is usually quite accurate but there are transcription errors that can and very often do occur. I apologize for any typographical errors that were not detected and corrected.        Ursula Alert, MD 06/07/2019, 3:15 PM

## 2019-06-11 ENCOUNTER — Other Ambulatory Visit: Payer: Self-pay | Admitting: Family Medicine

## 2019-06-12 ENCOUNTER — Other Ambulatory Visit: Payer: Self-pay

## 2019-06-13 ENCOUNTER — Encounter: Payer: Self-pay | Admitting: Family Medicine

## 2019-06-13 ENCOUNTER — Ambulatory Visit (INDEPENDENT_AMBULATORY_CARE_PROVIDER_SITE_OTHER): Payer: Medicare HMO | Admitting: Family Medicine

## 2019-06-13 VITALS — BP 132/66 | HR 76 | Temp 97.9°F | Resp 12 | Ht 64.0 in | Wt 194.0 lb

## 2019-06-13 DIAGNOSIS — R0981 Nasal congestion: Secondary | ICD-10-CM

## 2019-06-13 DIAGNOSIS — K5792 Diverticulitis of intestine, part unspecified, without perforation or abscess without bleeding: Secondary | ICD-10-CM | POA: Diagnosis not present

## 2019-06-13 MED ORDER — AMOXICILLIN-POT CLAVULANATE 875-125 MG PO TABS: 1.0000 | ORAL_TABLET | Freq: Two times a day (BID) | ORAL | 0 refills | Status: DC

## 2019-06-13 MED ORDER — FLUTICASONE PROPIONATE 50 MCG/ACT NA SUSP: 2.0000 | Freq: Every day | NASAL | 1 refills | Status: DC

## 2019-06-13 NOTE — Progress Notes (Signed)
   Subjective:    Patient ID: Stacy Chambers, female    DOB: [birth year], 53 y.o.   MRN: [id number]  Patient presents for ABD Pain (states that she can't do abd crunches anymore) and Sinus HA (x1 year- allergy meds not effective)   Pt here with abd pain, had pain in abd started LLQ  Friday after she did her stomach crunches, and when she is up and moving around . She had had increased diarrhea as well, no blood in stools, does get diarrhea and constipation on and off, told in past she had diverticulitis    no vomiting or fever , no pain when eating     Has chronic nasal congestion, dry nose, this may cause HA, no sneezing or cough, taking allegra not helping as much    Reviewed medications, she takes multiple psych meds    Review Of Systems:  GEN- denies fatigue, fever, weight loss,weakness, recent illness HEENT- denies eye drainage, change in vision, nasal discharge, CVS- denies chest pain, palpitations RESP- denies SOB, cough, wheeze ABD- denies N/V, change in stools, abd pain GU- denies dysuria, hematuria, dribbling, incontinence MSK- denies joint pain, muscle aches, injury Neuro- denies headache, dizziness, syncope, seizure activity       Objective:    BP 132/66   Pulse 76   Temp 97.9 F (36.6 C) (Oral)   Resp 12   Ht 5\' 4"  (1.626 m)   Wt 194 lb (88 kg)   SpO2 95%   BMI 33.30 kg/m  GEN- NAD, alert and oriented x3 HEENT- PERRL, EOMI, non injected sclera, pink conjunctiva, MMM, oropharynx clear, enlarged turbinates, no drainage, TM clear no effusion, no sinus tenderness  Neck- Supple, no LAD CVS- RRR, no murmur RESP-CTAB ABD-NABS,soft,mild TTP LLQ no rebound, no gaurding, no mass palpated  EXT- No edema Pulses- Radial 2+        Assessment & Plan:      Problem List Items Addressed This Visit    None    Visit Diagnoses    Diverticulitis    -  Primary   Treat for presumpive diverticulitis, looks well, but does has some LLQ pain, augmentin given,clear  liquids for 48hours to give some bowel rest Next step CT and if this does not improve   Nasal congestion       recommend nasal steroid for the swelling and congestion      Note: This dictation was prepared with Dragon dictation along with smaller phrase technology. Any transcriptional errors that result from this process are unintentional.

## 2019-06-13 NOTE — Patient Instructions (Addendum)
Take antibiotics as prescribed Use nasal spray- flonase  Clear liquids for 2 days  F/U  6 months

## 2019-07-19 ENCOUNTER — Other Ambulatory Visit: Payer: Self-pay

## 2019-07-19 ENCOUNTER — Other Ambulatory Visit: Payer: Self-pay | Admitting: *Deleted

## 2019-07-19 ENCOUNTER — Encounter: Payer: Self-pay | Admitting: Family Medicine

## 2019-07-19 ENCOUNTER — Ambulatory Visit (INDEPENDENT_AMBULATORY_CARE_PROVIDER_SITE_OTHER): Payer: Medicare HMO | Admitting: Family Medicine

## 2019-07-19 ENCOUNTER — Ambulatory Visit (HOSPITAL_COMMUNITY)
Admission: RE | Admit: 2019-07-19 | Discharge: 2019-07-19 | Disposition: A | Discharge status: Home / Self Care | Payer: Medicare HMO | Source: Ambulatory Visit | Attending: Family Medicine | Admitting: Family Medicine

## 2019-07-19 VITALS — BP 128/68 | HR 62 | Temp 99.4°F | Resp 14 | Ht 64.0 in | Wt 190.0 lb

## 2019-07-19 DIAGNOSIS — K8020 Calculus of gallbladder without cholecystitis without obstruction: Secondary | ICD-10-CM | POA: Diagnosis not present

## 2019-07-19 DIAGNOSIS — K5792 Diverticulitis of intestine, part unspecified, without perforation or abscess without bleeding: Secondary | ICD-10-CM | POA: Insufficient documentation

## 2019-07-19 DIAGNOSIS — R1032 Left lower quadrant pain: Secondary | ICD-10-CM

## 2019-07-19 DIAGNOSIS — M25551 Pain in right hip: Secondary | ICD-10-CM

## 2019-07-19 DIAGNOSIS — J302 Other seasonal allergic rhinitis: Secondary | ICD-10-CM | POA: Diagnosis not present

## 2019-07-19 DIAGNOSIS — R109 Unspecified abdominal pain: Secondary | ICD-10-CM | POA: Diagnosis not present

## 2019-07-19 DIAGNOSIS — G8929 Other chronic pain: Secondary | ICD-10-CM

## 2019-07-19 LAB — URINALYSIS, ROUTINE W REFLEX MICROSCOPIC
Bilirubin Urine: NEGATIVE
Glucose, UA: NEGATIVE
Hyaline Cast: NONE SEEN /LPF
Leukocytes,Ua: NEGATIVE
Nitrite: NEGATIVE
Protein, ur: NEGATIVE
Specific Gravity, Urine: 1.027 (ref 1.001–1.03)
WBC, UA: NONE SEEN /HPF (ref 0–5)
pH: 5.5 (ref 5.0–8.0)

## 2019-07-19 LAB — COMPREHENSIVE METABOLIC PANEL
AG Ratio: 2.2 (calc) (ref 1.0–2.5)
ALT: 27 U/L (ref 6–29)
AST: 24 U/L (ref 10–35)
Albumin: 4.7 g/dL (ref 3.6–5.1)
Alkaline phosphatase (APISO): 109 U/L (ref 37–153)
BUN: 13 mg/dL (ref 7–25)
CO2: 29 mmol/L (ref 20–32)
Calcium: 9.7 mg/dL (ref 8.6–10.4)
Chloride: 103 mmol/L (ref 98–110)
Creat: 0.82 mg/dL (ref 0.50–1.05)
Globulin: 2.1 g/dL (calc) (ref 1.9–3.7)
Glucose, Bld: 112 mg/dL — ABNORMAL HIGH (ref 65–99)
Potassium: 3.9 mmol/L (ref 3.5–5.3)
Sodium: 139 mmol/L (ref 135–146)
Total Bilirubin: 0.4 mg/dL (ref 0.2–1.2)
Total Protein: 6.8 g/dL (ref 6.1–8.1)

## 2019-07-19 LAB — CBC WITH DIFFERENTIAL/PLATELET
Absolute Monocytes: 426 cells/uL (ref 200–950)
Basophils Absolute: 61 cells/uL (ref 0–200)
Basophils Relative: 0.8 %
Eosinophils Absolute: 99 cells/uL (ref 15–500)
Eosinophils Relative: 1.3 %
HCT: 38.8 % (ref 35.0–45.0)
Hemoglobin: 12.9 g/dL (ref 11.7–15.5)
Lymphs Abs: 2538 cells/uL (ref 850–3900)
MCH: 28.9 pg (ref 27.0–33.0)
MCHC: 33.2 g/dL (ref 32.0–36.0)
MCV: 86.8 fL (ref 80.0–100.0)
MPV: 10.1 fL (ref 7.5–12.5)
Monocytes Relative: 5.6 %
Neutro Abs: 4476 cells/uL (ref 1500–7800)
Neutrophils Relative %: 58.9 %
Platelets: 226 10*3/uL (ref 140–400)
RBC: 4.47 10*6/uL (ref 3.80–5.10)
RDW: 12.8 % (ref 11.0–15.0)
Total Lymphocyte: 33.4 %
WBC: 7.6 10*3/uL (ref 3.8–10.8)

## 2019-07-19 LAB — MICROSCOPIC MESSAGE

## 2019-07-19 MED ORDER — IOHEXOL 300 MG/ML  SOLN
30.0000 mL | Freq: Once | INTRAMUSCULAR | Status: AC | PRN
  Administered 2019-07-19: 30 mL via ORAL

## 2019-07-19 MED ORDER — FLUCONAZOLE 150 MG PO TABS: 150.0000 mg | ORAL_TABLET | Freq: Once | ORAL | 0 refills | Status: AC

## 2019-07-19 MED ORDER — IOHEXOL 300 MG/ML  SOLN
100.0000 mL | Freq: Once | INTRAMUSCULAR | Status: AC | PRN
  Administered 2019-07-19: 100 mL via INTRAVENOUS

## 2019-07-19 NOTE — Progress Notes (Signed)
Subjective:    Patient ID: Stacy Chambers, female    DOB: 29-Oct-1965, 53 y.o.   MRN: [id number]  Patient presents for ABD Pain (thinks diverticulitis is acting up), Migraine (sinus pressure), and Joint Pain (general achiness to joints)   Patient here with multiple concerns.  Her main issue is her abdominal pain.  She was actually treated for diverticulitis back in August based on her history.  She did not have any imaging done at that time.  She completed antibiotic states that she did improve but now has symptoms all over again.  Monday she lifted her grand-daughter up a few times, now has pain all over her abdomen similar to her previous diverticulitis flares.  She has not had any diarrhea has actually been constipated she felt nauseous so she took Pepto-Bismol.  She denies any urinary symptoms no actual vomiting no chest pain shortness of breath.     Has chronic joint pain, takes mobic for her joint pain, Right hip, no fall or injury      This has helped her hip    She has been having headaches daily, using allergy medication   no change, she has been taking ibuprofen as well, even when she has taken the meloxicam        Review Of Systems:  GEN- denies fatigue, fever, weight loss,weakness, recent illness HEENT- denies eye drainage, change in vision, nasal discharge, CVS- denies chest pain, palpitations RESP- denies SOB, cough, wheeze ABD- + N/ no V, change in stools, +abd pain GU- denies dysuria, hematuria, dribbling, incontinence MSK- + joint pain, muscle aches, injury Neuro- denies headache, dizziness, syncope, seizure activity       Objective:    BP 128/68   Pulse 62   Temp 99.4 F (37.4 C) (Oral)   Resp 14   Ht 5\' 4"  (1.626 m)   Wt 190 lb (86.2 kg)   SpO2 96%   BMI 32.61 kg/m  GEN- NAD, alert and oriented x3 HEENT- PERRL, EOMI, non injected sclera, pink conjunctiva, MMM, oropharynx clear, TM clear no effusion, nares clear rhinorrhea, no sinus tenderness  Neck-  Supple, no thyromegaly CVS- RRR, no murmur RESP-CTAB ABD-NABS,soft,mild TTP lower quadrants, no rebound, no guarding, no CVA tenderness MSK- FAIR ROM spine, hips, knees  EXT- No edema Pulses- Radial, 2+        Assessment & Plan:      Problem List Items Addressed This Visit    None    Visit Diagnoses    Diverticulitis    -  Primary   Recurrent LLQ pain, history of diverticulitis, she did respond to antibiotics 1 month ago, but back with same pain, however now very constipated difficulty passing stools Obtain CT abd, need to r/u smoldering diverticulitis or other colitis with her history Stat labs, UA as wlel  GI referral pending results    Relevant Orders   CT Abdomen Pelvis W Contrast   CBC with Differential/Platelet   Comprehensive metabolic panel   Urinalysis, Routine w reflex microscopic   Left lower quadrant abdominal pain       Relevant Orders   CT Abdomen Pelvis W Contrast   Urinalysis, Routine w reflex microscopic   Seasonal allergies       No sign of sinusitis, can use tylenol for HA, continue allergy med   Chronic pain of right hip       Most likley DJD, no red flags on exam   Relevant Orders   DG Hip Unilat W  OR W/O Pelvis 2-3 Views Right      Note: This dictation was prepared with Dragon dictation along with smaller phrase technology. Any transcriptional errors that result from this process are unintentional.

## 2019-07-19 NOTE — Patient Instructions (Addendum)
Do not take ibuprofen with the meloxicam, do not take pepto bismol We will call with lab results Keep using nasal spray and allergy meds You can take tylenol for the headaches  F/U pending results    GET CT scan and hip xray

## 2019-07-20 ENCOUNTER — Other Ambulatory Visit: Payer: Self-pay | Admitting: Family Medicine

## 2019-07-26 ENCOUNTER — Other Ambulatory Visit: Payer: Self-pay

## 2019-07-26 ENCOUNTER — Encounter: Payer: Self-pay | Admitting: Psychiatry

## 2019-07-26 ENCOUNTER — Ambulatory Visit (INDEPENDENT_AMBULATORY_CARE_PROVIDER_SITE_OTHER): Payer: Medicare HMO | Admitting: Psychiatry

## 2019-07-26 DIAGNOSIS — T43505A Adverse effect of unspecified antipsychotics and neuroleptics, initial encounter: Secondary | ICD-10-CM

## 2019-07-26 DIAGNOSIS — F411 Generalized anxiety disorder: Secondary | ICD-10-CM | POA: Diagnosis not present

## 2019-07-26 DIAGNOSIS — F3162 Bipolar disorder, current episode mixed, moderate: Secondary | ICD-10-CM | POA: Diagnosis not present

## 2019-07-26 DIAGNOSIS — R413 Other amnesia: Secondary | ICD-10-CM

## 2019-07-26 DIAGNOSIS — F5105 Insomnia due to other mental disorder: Secondary | ICD-10-CM | POA: Diagnosis not present

## 2019-07-26 DIAGNOSIS — G2571 Drug induced akathisia: Secondary | ICD-10-CM | POA: Diagnosis not present
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
## 2019-07-27 ENCOUNTER — Telehealth: Payer: Self-pay | Admitting: Family Medicine

## 2019-07-27 NOTE — Telephone Encounter (Signed)
07/19/2019- Notes from provider attached to image: Call pt her CT scan does not show any diverticulitis or infection. She has fatty liver and gallstones , but gallbladder is not infected.  If she gets any RUQ pain she can be referred to surgeon for removal. For her bowels- recommend she take Miralax 1 cap full daily for constipation. Her labs were normal and urine sample showed yeast infection   - send diflucan 150mg  po x 1 dose, repeat in 3 days  Only take the meloxicam once a day for arthritis, no ibuprofen   Call placed to patient. Patient had concerns about spots of liver. Advised that she did not have "spot" on liver, but instead fatty liver was noted.   Answered questions in regards to fatty liver disease.   Non-alcoholic fatty liver disease (NAFLD) is the build up of extra fat in liver cells that is not caused by alcohol. It is normal for the liver to contain some fat. However, if more than 5% - 10% percent of the liver's weight is fat, then it is called a fatty liver (steatosis). NAFLD tends to develop in people who are overweight or obese or have diabetes, high cholesterol or high triglycerides. Rapid weight loss and poor eating habits also may lead to NAFLD. There are no medical treatments yet for NAFLD. Eating a healthy diet and exercising regularly may help prevent liver damage from starting or reverse it in the early stages. Try to avoid fried foods and fatty foods (red meats, pork products, whole milk products, and egg yolks) and try to eat more fresh fruits and vegetables.

## 2019-07-27 NOTE — Telephone Encounter (Signed)
Patient has questions about her ct scan that she had recently  [phone]

## 2019-08-01 ENCOUNTER — Ambulatory Visit: Payer: Medicare HMO | Admitting: Family Medicine

## 2019-08-01 ENCOUNTER — Telehealth: Payer: Self-pay

## 2019-08-01 NOTE — Telephone Encounter (Signed)
Please call her and ask her to call her health insurance plan for a list

## 2019-08-01 NOTE — Telephone Encounter (Signed)
pt left message that the lady you referrred her too doesnt take her insurance

## 2019-08-02 NOTE — Telephone Encounter (Signed)
Pt was called and left a message to call her insurance and find out who is in network.

## 2019-08-07 ENCOUNTER — Other Ambulatory Visit: Payer: Self-pay | Admitting: Family Medicine

## 2019-08-11 ENCOUNTER — Other Ambulatory Visit: Payer: Self-pay | Admitting: Psychiatry

## 2019-08-11 DIAGNOSIS — F411 Generalized anxiety disorder: Secondary | ICD-10-CM

## 2019-08-11 DIAGNOSIS — F3162 Bipolar disorder, current episode mixed, moderate: Secondary | ICD-10-CM

## 2019-08-18 ENCOUNTER — Other Ambulatory Visit: Payer: Self-pay | Admitting: Psychiatry

## 2019-08-18 DIAGNOSIS — F3162 Bipolar disorder, current episode mixed, moderate: Secondary | ICD-10-CM

## 2019-08-21 ENCOUNTER — Other Ambulatory Visit: Payer: Self-pay | Admitting: Psychiatry

## 2019-08-21 DIAGNOSIS — F411 Generalized anxiety disorder: Secondary | ICD-10-CM

## 2019-08-30 ENCOUNTER — Ambulatory Visit (INDEPENDENT_AMBULATORY_CARE_PROVIDER_SITE_OTHER): Payer: Medicare HMO | Admitting: Psychiatry

## 2019-08-30 ENCOUNTER — Encounter: Payer: Self-pay | Admitting: Psychiatry

## 2019-08-30 ENCOUNTER — Other Ambulatory Visit: Payer: Self-pay

## 2019-08-30 DIAGNOSIS — F3178 Bipolar disorder, in full remission, most recent episode mixed: Secondary | ICD-10-CM | POA: Diagnosis not present

## 2019-08-30 DIAGNOSIS — F5105 Insomnia due to other mental disorder: Secondary | ICD-10-CM | POA: Diagnosis not present

## 2019-08-30 DIAGNOSIS — T43505A Adverse effect of unspecified antipsychotics and neuroleptics, initial encounter: Secondary | ICD-10-CM | POA: Diagnosis not present

## 2019-08-30 DIAGNOSIS — R413 Other amnesia: Secondary | ICD-10-CM | POA: Diagnosis not present

## 2019-08-30 DIAGNOSIS — G2571 Drug induced akathisia: Secondary | ICD-10-CM

## 2019-08-30 DIAGNOSIS — F411 Generalized anxiety disorder: Secondary | ICD-10-CM

## 2019-08-30 MED ORDER — BENZTROPINE MESYLATE 1 MG PO TABS: 1.0000 mg | ORAL_TABLET | Freq: Two times a day (BID) | ORAL | 1 refills | Status: DC

## 2019-08-30 MED ORDER — ZIPRASIDONE HCL 80 MG PO CAPS: 80.0000 mg | ORAL_CAPSULE | Freq: Every day | ORAL | 1 refills | Status: DC

## 2019-08-30 NOTE — Progress Notes (Signed)
Virtual Visit via Telephone Note  I connected with Stacy Chambers on 08/30/19 at  4:15 PM EST by telephone and verified that I am speaking with the correct person using two identifiers.   I discussed the limitations, risks, security and privacy concerns of performing an evaluation and management service by telephone and the availability of in person appointments. I also discussed with the patient that there may be a patient responsible charge related to this service. The patient expressed understanding and agreed to proceed.    I discussed the assessment and treatment plan with the patient. The patient was provided an opportunity to ask questions and all were answered. The patient agreed with the plan and demonstrated an understanding of the instructions.   The patient was advised to call back or seek an in-person evaluation if the symptoms worsen or if the condition fails to improve as anticipated.    [facility] MD OP Progress Note  08/30/2019 5:20 PM Stacy Chambers  MRN:  [id number]  Chief Complaint:  Chief Complaint    Follow-up     HPI: Stacy Chambers is a 53 year old Caucasian female, married, lives in [place], has a history of bipolar disorder, GAD, insomnia, acute neuroleptic induced akathisia, memory loss, RLS, COPD, chronic pain, abnormal liver function test was evaluated by telemedicine today.     Patient today reports her mood symptoms have improved.  She reports her anxiety symptoms as better the past few days.  She reports sleep is good.  Patient however reports she has noticed recent abnormal movements of her hands.  She reports she seems to be making these movements as though playing a piano.  She does not know if this is due to medication side effects.  Patient also reports some memory problems.  She struggles with short-term memory loss, loses track while she is in conversations, has been having trouble while driving in a car with places.  Patient reports it had gotten better  in the past however recently she has noticed the memory problems is getting worse again.  Discussed referral to neurology.    Patient denies any suicidality, homicidality or perceptual disturbances.   Visit Diagnosis:    ICD-10-CM   1. Bipolar disorder, in full remission, most recent episode mixed ([place])  F31.78 benztropine (COGENTIN) 1 MG tablet    ziprasidone (GEODON) 80 MG capsule  2. GAD (generalized anxiety disorder)  F41.1   3. Insomnia due to mental condition  F51.05   4. Acute neuroleptic-induced akathisia  G25.71    T43.505A   5. Memory loss  R41.3     Past Psychiatric History: I have reviewed past psychiatric history from my progress note on 10/12/2018.  Past trials of Zoloft, BuSpar, carbamazepine  Past Medical History:  Past Medical History:  Diagnosis Date  . Abnormal blood chemistry 10/03/2015  . Anginal pain ([place])   . Anxiety   . Anxiety, generalized 02/21/2015  . Bipolar affective disorder ([place]) 03/08/2013   Overview:  Last Assessment & Plan:  Continue meds, f/u with psych Restart benzo at 1 po BID prn,    . Bipolar disorder ([place])   . 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 Chronic obstructive pulmonary disease ([place]) 10/03/2015  . COPD (chronic obstructive pulmonary disease) ([facility])    no inhalers  . Elevated liver function tests 11/26/2004   H/O NEGATIVE LIVER BIOPSY, workup negative  . Gallstones 02/21/2015  . GERD (gastroesophageal reflux disease)   . Hyperlipidemia   . Insomnia   . Lower back  pain   . Osteoarthritis   . Ovarian cancer ([place]) 1995  . Restless legs syndrome (RLS)   . RLS (restless legs syndrome)   . Sedative, hypnotic or anxiolytic dependence with withdrawal, 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[place])   . Wears dentures    full upper and lower    Past Surgical History:  Procedure Laterality Date  . ABDOMINAL HYSTERECTOMY    . APPENDECTOMY    . BREAST BIOPSY Left    neg- core  . CESAREAN SECTION    . COLONOSCOPY WITH PROPOFOL N/A 05/30/2018   Procedure: COLONOSCOPY WITH  PROPOFOL;  Surgeon: Manya Silvas, MD;  Location: [facility] ENDOSCOPY;  Service: Endoscopy;  Laterality: N/A;  . EXCISION MORTON'S NEUROMA Left 10/07/2016   Procedure: EXCISION MORTON'S NEUROMA;  Surgeon: [name], DPM;  Location: [facility];  Service: Podiatry;  Laterality: Left;  IV WITH LOCAL  . LIVER BIOPSY  2005   PerPt: Done to eval elevated LFTs and biopsy provided no definite dx/cause of elevated LFTs  . MULTIPLE TOOTH EXTRACTIONS    . OOPHORECTOMY      Family Psychiatric History: I have reviewed family psychiatric history from my progress note on 10/12/2018  Family History:  Family History  Adopted: Yes  Problem Relation Age of Onset  . COPD Mother   . Depression Mother   . Colon cancer Mother        unsure of age  . Cancer Father        Lung CA, Skin Cancer--?type  . COPD Father   . Stroke Father   . Cancer Sister 88       cervical cancer  . COPD Brother   . Alcohol abuse Brother   . Stroke Brother   . COPD Maternal Aunt   . Depression Daughter   . Schizophrenia Son   . Breast cancer Neg Hx     Social History: I have reviewed social history from my progress note on 10/12/2018 Social History   Socioeconomic History  . Marital status: Married    Spouse name: Not on file  . Number of children: Not on file  . Years of education: Not on file  . Highest education level: Not on file  Occupational History  . Not on file  Social Needs  . Financial resource strain: Not on file  . Food insecurity    Worry: Not on file    Inability: Not on file  . Transportation needs    Medical: Not on file    Non-medical: Not on file  Tobacco Use  . Smoking status: Former Smoker    Packs/day: 1.00    Years: 30.00    Pack years: 30.00    Types: Cigarettes    Quit date: 04/08/2009    Years since quitting: 10.4  . Smokeless tobacco: Never Used  Substance and Sexual Activity  . Alcohol use: Yes    Alcohol/week: 3.0 - 7.0 standard drinks    Types: 3 - 7 Shots of  liquor per week    Comment: occasionally  . Drug use: Yes    Types: Marijuana    Comment: told to refrain until after surgery  . Sexual activity: Yes    Birth control/protection: Surgical  Lifestyle  . Physical activity    Days per week: Not on file    Minutes per session: Not on file  . Stress: Not on file  Relationships  . Social Herbalist on phone: Not on file    Gets together:  Not on file    Attends religious service: Not on file    Active member of club or organization: Not on file    Attends meetings of clubs or organizations: Not on file    Relationship status: Not on file  Other Topics Concern  . Not on file  Social History Narrative  . Not on file    Allergies:  Allergies  Allergen Reactions  . Benadryl [Diphenhydramine Hcl]     Hives/ rapid heartrate  . Codeine   . Vicodin [Hydrocodone-Acetaminophen] Other (See Comments)    dizziness    Metabolic Disorder Labs: Lab Results  Component Value Date   HGBA1C 5.6 09/14/2018   MPG 114 09/14/2018   No results found for: PROLACTIN Lab Results  Component Value Date   CHOL 139 02/01/2019   TRIG 166 (H) 02/01/2019   HDL 36 (L) 02/01/2019   CHOLHDL 3.9 02/01/2019   VLDL 18 03/04/2015   LDLCALC 77 02/01/2019   LDLCALC 84 09/14/2018   Lab Results  Component Value Date   TSH 2.320 10/24/2018   TSH 2.00 10/21/2018    Therapeutic Level Labs: Lab Results  Component Value Date   LITHIUM 0.3 (L) 10/24/2018   LITHIUM 0.49 (L) 01/13/2009   No results found for: VALPROATE No components found for:  CBMZ  Current Medications: Current Outpatient Medications  Medication Sig Dispense Refill  . alendronate (FOSAMAX) 70 MG tablet Take 1 tablet (70 mg total) by mouth once a week. Take with a full glass of water on an empty stomach. 12 tablet 3  . ammonium lactate (AMLACTIN) 12 % cream APPLY TOPICALLY AS NEEDED FOR DRY SKIN. 385 g 0  . benztropine (COGENTIN) 1 MG tablet Take 1 tablet (1 mg total) by mouth  2 (two) times daily. For side effects of geodon 180 tablet 1  . escitalopram (LEXAPRO) 5 MG tablet TAKE 1 TABLET (5 MG TOTAL) BY MOUTH DAILY. 90 tablet 0  . esomeprazole (NEXIUM) 20 MG capsule Take 1 capsule (20 mg total) by mouth daily as needed (take on empty stomach). 90 capsule 1  . fluticasone (FLONASE) 50 MCG/ACT nasal spray Place 2 sprays into both nostrils daily. 16 g 1  . fluticasone (FLOVENT HFA) 220 MCG/ACT inhaler Inhale into the lungs 2 (two) times daily.    . hydrOXYzine (ATARAX/VISTARIL) 50 MG tablet TAKE 1 TABLET (50 MG TOTAL) BY MOUTH 2 (TWO) TIMES DAILY AS NEEDED. FOR SEVERE ANXIETY ATTACKS 180 tablet 0  . lamoTRIgine (LAMICTAL) 100 MG tablet TAKE 1 TABLET (100 MG TOTAL) BY MOUTH DAILY. 90 tablet 0  . meloxicam (MOBIC) 7.5 MG tablet TAKE 1 TABLET TWICE DAILY AS NEEDED FOR PAIN 180 tablet 0  . rosuvastatin (CRESTOR) 10 MG tablet TAKE 1 TABLET EVERY DAY 90 tablet 0  . traZODone (DESYREL) 100 MG tablet TAKE 1 TABLET (100 MG TOTAL) BY MOUTH AT BEDTIME. 90 tablet 1  . ziprasidone (GEODON) 80 MG capsule Take 1 capsule (80 mg total) by mouth daily with supper. 90 capsule 1   No current facility-administered medications for this visit.      Musculoskeletal: Strength & Muscle Tone: UTA Gait & Station: Reports as WNL Patient leans: N/A  Psychiatric Specialty Exam: Review of Systems  Psychiatric/Behavioral: Positive for memory loss. The patient is nervous/anxious.   All other systems reviewed and are negative.   There were no vitals taken for this visit.There is no height or weight on file to calculate BMI.  General Appearance: UTA  Eye Contact:  UTA  Speech:  Clear and Coherent  Volume:  Normal  Mood:  Anxious  Affect:  UTA  Thought Process:  Goal Directed and Descriptions of Associations: Intact  Orientation:  Full (Time, Place, and Person)  Thought Content: Logical   Suicidal Thoughts:  No  Homicidal Thoughts:  No  Memory:  Immediate;   Fair Recent;   Fair Remote;    Fair  Judgement:  Fair  Insight:  Fair  Psychomotor Activity:  UTA  Concentration:  Concentration: Fair and Attention Span: Fair  Recall:  [employer] of Knowledge: Fair  Language: Fair  Akathisia:  No  Handed:  Left  AIMS (if indicated):reports movements of her hands - right sided - able to control it   Assets:  Communication Skills Desire for Improvement Housing Social Support  ADL's:  Intact  Cognition:IMPAIRED - unspecified   Sleep:  Fair   Screenings: AIMS     Office Visit from 10/24/2018 in [facility] Office Visit from 04/04/2018 in [facility] Office Visit from 01/03/2018 in [facility] Office Visit from 11/18/2016 in [facility] Total Score  5  0  0  0    PHQ2-9     Office Visit from 10/21/2018 in [facility] Visit from 09/29/2018 in [facility] Visit from 09/14/2018 in [facility] Visit from 04/27/2018 in [facility]  PHQ-2 Total Score  4  4  5  5   PHQ-9 Total Score  21  21  19  20        Assessment and Plan: Stacy Chambers is a 53 year old Caucasian female, married, lives in [place], has a history of bipolar disorder, anxiety disorder, COPD, RLS, elevated liver enzyme was evaluated by telemedicine today.  Patient with psychosocial stressors of the death of her son who passed away 11 years ago, relationship struggles with her husband and other children.  She is biologically predisposed given her family history.  Patient is currently struggling with movement problems of her hands as well as memory issues.  Discussed plan as noted below.  Plan Bipolar disorder-stable Geodon 80 mg p.o. daily with supper.  She did not tolerate the higher dosage. Cogentin 1 mg p.o. twice daily Lamotrigine 100 mg p.o. daily  GAD-improving Lamotrigine has been readjusted Hydroxyzine 50 mg p.o.  twice daily as needed Lexapro 5 mg p.o. daily Patient referred for therapy-provided information for Ms. Margaret Burnette-therapist in the community  Acute neuroleptic induced akathisia-resolved Cogentin 1 mg p.o. 2 times daily as needed Patient however does report some recent movement problems of her hands.  Unknown if this is due to her Geodon.  Discussed reducing the dosage of Geodon however she is not interested.  Will monitor closely.  Insomnia-stable Melatonin 3 to 5 mg p.o. nightly  Memory loss-unstable Will refer patient to neurology today.  Follow-up in clinic in 1 month or sooner if needed.  December 15 at 11:20 AM  I have spent atleast 15 minutes non face to face with patient today. More than 50 % of the time was spent for psychoeducation and supportive psychotherapy and care coordination. This note was generated in part or whole with voice recognition software. Voice recognition is usually quite accurate but there are transcription errors that can and very often do occur. I apologize for any typographical errors that were not detected and corrected.      Ursula Alert, MD 08/30/2019, 5:20 PM

## 2019-10-09 ENCOUNTER — Other Ambulatory Visit: Payer: Self-pay | Admitting: Psychiatry

## 2019-10-09 DIAGNOSIS — F3162 Bipolar disorder, current episode mixed, moderate: Secondary | ICD-10-CM

## 2019-10-09 DIAGNOSIS — F411 Generalized anxiety disorder: Secondary | ICD-10-CM

## 2019-10-10 ENCOUNTER — Other Ambulatory Visit: Payer: Self-pay

## 2019-10-10 ENCOUNTER — Ambulatory Visit (INDEPENDENT_AMBULATORY_CARE_PROVIDER_SITE_OTHER): Payer: Medicare HMO | Admitting: Psychiatry

## 2019-10-10 ENCOUNTER — Encounter: Payer: Self-pay | Admitting: Psychiatry

## 2019-10-10 DIAGNOSIS — F3178 Bipolar disorder, in full remission, most recent episode mixed: Secondary | ICD-10-CM

## 2019-10-10 DIAGNOSIS — R413 Other amnesia: Secondary | ICD-10-CM

## 2019-10-10 DIAGNOSIS — F411 Generalized anxiety disorder: Secondary | ICD-10-CM

## 2019-10-10 DIAGNOSIS — F5105 Insomnia due to other mental disorder: Secondary | ICD-10-CM

## 2019-10-10 MED ORDER — BENZTROPINE MESYLATE 1 MG PO TABS: 0.5000 mg | ORAL_TABLET | Freq: Two times a day (BID) | ORAL | 1 refills | Status: DC

## 2019-10-10 NOTE — Progress Notes (Signed)
Virtual Visit via Video Note  I connected with Stacy Chambers on 10/10/19 at 11:20 AM EST by a video enabled telemedicine application and verified that I am speaking with the correct person using two identifiers.   I discussed the limitations of evaluation and management by telemedicine and the availability of in person appointments. The patient expressed understanding and agreed to proceed.     I discussed the assessment and treatment plan with the patient. The patient was provided an opportunity to ask questions and all were answered. The patient agreed with the plan and demonstrated an understanding of the instructions.   The patient was advised to call back or seek an in-person evaluation if the symptoms worsen or if the condition fails to improve as anticipated.   [facility] MD OP Progress Note  10/10/2019 12:58 PM Stacy Chambers  MRN:  [id number]  Chief Complaint:  Chief Complaint    Follow-up     HPI: Stacy Chambers is a 53 year old Caucasian female, married, lives in [place], has a history of bipolar disorder, GAD, insomnia, acute neuroleptic induced akathisia, memory loss, RLS, COPD, chronic pain, abnormal liver function test was evaluated by telemedicine today.  Patient today reports she had mild worsening memory problems as well as dry mouth recently.  She however reports she changed the timing of her Lexapro to bedtime and that seems to help.  Her memory problems are better the past 1 week.  She continues to struggle with her train of thoughts when she is in the middle of a conversation.  Patient was referred for neurology evaluation in the past however she did not follow through with recommendation.  Patient today reports she wants to wait since she is getting better.  Patient denies any other mood symptoms at this time.  Patient reports sleep is good.  Patient denies any suicidality, homicidality or perceptual disturbances.  Patient denies any other concerns today. Visit  Diagnosis:    ICD-10-CM   1. Bipolar disorder, in full remission, most recent episode mixed ([place])  F31.78 benztropine (COGENTIN) 1 MG tablet  2. GAD (generalized anxiety disorder)  F41.1   3. Insomnia due to mental condition  F51.05   4. Memory loss  R41.3     Past Psychiatric History: I have reviewed past psychiatric history from my progress note on 10/12/2018.  Past trials of BuSpar, Zoloft, carbamazepine.  Past Medical History:  Past Medical History:  Diagnosis Date  . Abnormal blood chemistry 10/03/2015  . Anginal pain ([place])   . Anxiety   . Anxiety, generalized 02/21/2015  . Bipolar affective disorder ([place]) 03/08/2013   Overview:  Last Assessment & Plan:  Continue meds, f/u with psych Restart benzo at 1 po BID prn,    . Bipolar disorder ([place])   . 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 Chronic obstructive pulmonary disease ([place]) 10/03/2015  . COPD (chronic obstructive pulmonary disease) ([facility])    no inhalers  . Elevated liver function tests 11/26/2004   H/O NEGATIVE LIVER BIOPSY, workup negative  . Gallstones 02/21/2015  . GERD (gastroesophageal reflux disease)   . Hyperlipidemia   . Insomnia   . Lower back pain   . Osteoarthritis   . Ovarian cancer ([place]) 1995  . Restless legs syndrome (RLS)   . RLS (restless legs syndrome)   . Sedative, hypnotic or anxiolytic dependence with withdrawal, 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[place])   . Wears dentures    full upper and lower    Past Surgical History:  Procedure Laterality Date  .  ABDOMINAL HYSTERECTOMY    . APPENDECTOMY    . BREAST BIOPSY Left    neg- core  . CESAREAN SECTION    . COLONOSCOPY WITH PROPOFOL N/A 05/30/2018   Procedure: COLONOSCOPY WITH PROPOFOL;  Surgeon: Manya Silvas, MD;  Location: [facility] ENDOSCOPY;  Service: Endoscopy;  Laterality: N/A;  . EXCISION MORTON'S NEUROMA Left 10/07/2016   Procedure: EXCISION MORTON'S NEUROMA;  Surgeon: [name], DPM;  Location: [facility];  Service: Podiatry;  Laterality: Left;  IV WITH LOCAL  .  LIVER BIOPSY  2005   PerPt: Done to eval elevated LFTs and biopsy provided no definite dx/cause of elevated LFTs  . MULTIPLE TOOTH EXTRACTIONS    . OOPHORECTOMY      Family Psychiatric History: Reviewed family psychiatric history from my progress note on 10/12/2018.  Family History:  Family History  Adopted: Yes  Problem Relation Age of Onset  . COPD Mother   . Depression Mother   . Colon cancer Mother        unsure of age  . Cancer Father        Lung CA, Skin Cancer--?type  . COPD Father   . Stroke Father   . Cancer Sister 31       cervical cancer  . COPD Brother   . Alcohol abuse Brother   . Stroke Brother   . COPD Maternal Aunt   . Depression Daughter   . Schizophrenia Son   . Breast cancer Neg Hx     Social History: Reviewed social history from my progress note on 10/12/2018. Social History   Socioeconomic History  . Marital status: Married    Spouse name: Not on file  . Number of children: Not on file  . Years of education: Not on file  . Highest education level: Not on file  Occupational History  . Not on file  Tobacco Use  . Smoking status: Former Smoker    Packs/day: 1.00    Years: 30.00    Pack years: 30.00    Types: Cigarettes    Quit date: 04/08/2009    Years since quitting: 10.5  . Smokeless tobacco: Never Used  Substance and Sexual Activity  . Alcohol use: Yes    Alcohol/week: 3.0 - 7.0 standard drinks    Types: 3 - 7 Shots of liquor per week    Comment: occasionally  . Drug use: Yes    Types: Marijuana    Comment: told to refrain until after surgery  . Sexual activity: Yes    Birth control/protection: Surgical  Other Topics Concern  . Not on file  Social History Narrative  . Not on file   Social Determinants of Health   Financial Resource Strain:   . Difficulty of Paying Living Expenses: Not on file  Food Insecurity:   . Worried About Charity fundraiser in the Last Year: Not on file  . Ran Out of Food in the Last Year: Not on file   Transportation Needs:   . Lack of Transportation (Medical): Not on file  . Lack of Transportation (Non-Medical): Not on file  Physical Activity:   . Days of Exercise per Week: Not on file  . Minutes of Exercise per Session: Not on file  Stress:   . Feeling of Stress : Not on file  Social Connections:   . Frequency of Communication with Friends and Family: Not on file  . Frequency of Social Gatherings with Friends and Family: Not on file  . Attends  Religious Services: Not on file  . Active Member of Clubs or Organizations: Not on file  . Attends Archivist Meetings: Not on file  . Marital Status: Not on file    Allergies:  Allergies  Allergen Reactions  . Benadryl [Diphenhydramine Hcl]     Hives/ rapid heartrate  . Codeine   . Vicodin [Hydrocodone-Acetaminophen] Other (See Comments)    dizziness    Metabolic Disorder Labs: Lab Results  Component Value Date   HGBA1C 5.6 09/14/2018   MPG 114 09/14/2018   No results found for: PROLACTIN Lab Results  Component Value Date   CHOL 139 02/01/2019   TRIG 166 (H) 02/01/2019   HDL 36 (L) 02/01/2019   CHOLHDL 3.9 02/01/2019   VLDL 18 03/04/2015   LDLCALC 77 02/01/2019   LDLCALC 84 09/14/2018   Lab Results  Component Value Date   TSH 2.320 10/24/2018   TSH 2.00 10/21/2018    Therapeutic Level Labs: Lab Results  Component Value Date   LITHIUM 0.3 (L) 10/24/2018   LITHIUM 0.49 (L) 01/13/2009   No results found for: VALPROATE No components found for:  CBMZ  Current Medications: Current Outpatient Medications  Medication Sig Dispense Refill  . alendronate (FOSAMAX) 70 MG tablet Take 1 tablet (70 mg total) by mouth once a week. Take with a full glass of water on an empty stomach. 12 tablet 3  . ammonium lactate (AMLACTIN) 12 % cream APPLY TOPICALLY AS NEEDED FOR DRY SKIN. 385 g 0  . benztropine (COGENTIN) 1 MG tablet Take 0.5 tablets (0.5 mg total) by mouth 2 (two) times daily. Patient advised to reduce dosage  180 tablet 1  . escitalopram (LEXAPRO) 5 MG tablet TAKE 1 TABLET BY MOUTH DAILY. 90 tablet 0  . esomeprazole (NEXIUM) 20 MG capsule Take 1 capsule (20 mg total) by mouth daily as needed (take on empty stomach). 90 capsule 1  . fluticasone (FLONASE) 50 MCG/ACT nasal spray Place 2 sprays into both nostrils daily. 16 g 1  . fluticasone (FLOVENT HFA) 220 MCG/ACT inhaler Inhale into the lungs 2 (two) times daily.    . hydrOXYzine (ATARAX/VISTARIL) 50 MG tablet TAKE 1 TABLET (50 MG TOTAL) BY MOUTH 2 (TWO) TIMES DAILY AS NEEDED. FOR SEVERE ANXIETY ATTACKS 180 tablet 0  . lamoTRIgine (LAMICTAL) 100 MG tablet TAKE 1 TABLET BY MOUTH DAILY. 90 tablet 0  . meloxicam (MOBIC) 7.5 MG tablet TAKE 1 TABLET TWICE DAILY AS NEEDED FOR PAIN 180 tablet 0  . rosuvastatin (CRESTOR) 10 MG tablet TAKE 1 TABLET EVERY DAY 90 tablet 0  . traZODone (DESYREL) 100 MG tablet TAKE 1 TABLET (100 MG TOTAL) BY MOUTH AT BEDTIME. 90 tablet 1  . ziprasidone (GEODON) 80 MG capsule Take 1 capsule (80 mg total) by mouth daily with supper. 90 capsule 1   No current facility-administered medications for this visit.     Musculoskeletal: Strength & Muscle Tone: UTA Gait & Station: normal Patient leans: N/A  Psychiatric Specialty Exam: Review of Systems  HENT:       Dry mouth  Psychiatric/Behavioral: Negative for agitation, behavioral problems, confusion, decreased concentration, dysphoric mood, hallucinations, self-injury, sleep disturbance and suicidal ideas. The patient is not nervous/anxious and is not hyperactive.        Memory changes  All other systems reviewed and are negative.   There were no vitals taken for this visit.There is no height or weight on file to calculate BMI.  General Appearance: Casual  Eye Contact:  Fair  Speech:  Clear and Coherent  Volume:  Normal  Mood:  Euthymic  Affect:  Congruent  Thought Process:  Goal Directed and Descriptions of Associations: Intact  Orientation:  Full (Time, Place, and  Person)  Thought Content: Logical   Suicidal Thoughts:  No  Homicidal Thoughts:  No  Memory:  Immediate;   Fair Recent;   Fair Remote;   Fair  Judgement:  Fair  Insight:  Fair  Psychomotor Activity:  Normal  Concentration:  Concentration: Fair and Attention Span: Fair  Recall:  [employer] of Knowledge: Fair  Language: Fair  Akathisia:  No  Handed:  Right  AIMS (if indicated): UTA  Assets:  Communication Skills Desire for [place] Talents/Skills Transportation  ADL's:  Intact  Cognition: WNL  Sleep:  Fair   Screenings: AIMS     Office Visit from 10/24/2018 in [facility] Office Visit from 04/04/2018 in [facility] Office Visit from 01/03/2018 in [facility] Office Visit from 11/18/2016 in [facility] Total Score  5  0  0  0    PHQ2-9     Office Visit from 10/21/2018 in [facility] Visit from 09/29/2018 in [facility] Office Visit from 09/14/2018 in [facility] Office Visit from 04/27/2018 in [facility]  PHQ-2 Total Score  4  4  5  5   PHQ-9 Total Score  21  21  19  20        Assessment and Plan: Stacy Chambers is a 53 year old Caucasian female, married, lives in [place], has a history of bipolar disorder, anxiety disorder, COPD, RLS, elevated liver enzyme was evaluated by telemedicine today.  Patient with psychosocial stressors of death of her son who passed away 11 years ago, relationship struggles with her husband and other children.  Patient is biologically predisposed given her family history.  Patient continues to struggle with memory issues although she reports today they are improving.  Plan as noted below.  Plan Bipolar disorder-stable Geodon 80 mg p.o. daily with supper.  She did not tolerate the higher dosage. Discussed with patient to change Cogentin to  half tablet twice a day and to skip dosages here and there.  Discussed with patient the effect of Cogentin on her cognitive function. Lamotrigine 100 mg p.o. daily  GAD-improving Lamotrigine as prescribed Hydroxyzine 50 mg p.o. twice daily as needed, Patient is aware of the cognitive effects of hydroxyzine. Lexapro 5 mg p.o. daily. Patient was referred for psychotherapy sessions- Ms. Theodoro Parma.  Insomnia-stable Melatonin 3 to 5 mg p.o. nightly  Memory loss-unstable Patient was referred to neurology-she has been noncompliant. Patient reports she wants to wait.  Follow-up in clinic in 1 to 2 months or sooner if needed.  February 18 at 11 AM  I have spent atleast 15 minutes non face to face with patient today. More than 50 % of the time was spent for psychoeducation and supportive psychotherapy and care coordination. This note was generated in part or whole with voice recognition software. Voice recognition is usually quite accurate but there are transcription errors that can and very often do occur. I apologize for any typographical errors that were not detected and corrected.       Ursula Alert, MD 10/10/2019, 12:58 PM

## 2019-10-18 ENCOUNTER — Other Ambulatory Visit: Payer: Self-pay | Admitting: Family Medicine

## 2019-10-31 ENCOUNTER — Other Ambulatory Visit: Payer: Self-pay | Admitting: Psychiatry

## 2019-10-31 ENCOUNTER — Other Ambulatory Visit: Payer: Self-pay | Admitting: Family Medicine

## 2019-10-31 DIAGNOSIS — F3178 Bipolar disorder, in full remission, most recent episode mixed: Secondary | ICD-10-CM

## 2019-10-31 DIAGNOSIS — F3162 Bipolar disorder, current episode mixed, moderate: Secondary | ICD-10-CM

## 2019-10-31 DIAGNOSIS — F411 Generalized anxiety disorder: Secondary | ICD-10-CM

## 2019-11-01 MED ORDER — BENZTROPINE MESYLATE 1 MG PO TABS: 0.5000 mg | ORAL_TABLET | Freq: Two times a day (BID) | ORAL | 1 refills | Status: DC

## 2019-11-01 MED ORDER — ESCITALOPRAM OXALATE 5 MG PO TABS: 5.0000 mg | ORAL_TABLET | Freq: Every day | ORAL | 1 refills | Status: DC

## 2019-11-01 MED ORDER — LAMOTRIGINE 100 MG PO TABS: 100.0000 mg | ORAL_TABLET | Freq: Every day | ORAL | 1 refills | Status: DC

## 2019-11-01 MED ORDER — ZIPRASIDONE HCL 80 MG PO CAPS: 80.0000 mg | ORAL_CAPSULE | Freq: Every day | ORAL | 1 refills | Status: DC

## 2019-11-01 NOTE — Telephone Encounter (Signed)
Sent lamictal, cogentin, geodon, lexapro to pharmacy

## 2019-11-04 ENCOUNTER — Other Ambulatory Visit: Payer: Self-pay | Admitting: Psychiatry

## 2019-11-04 DIAGNOSIS — F411 Generalized anxiety disorder: Secondary | ICD-10-CM

## 2019-11-28 ENCOUNTER — Other Ambulatory Visit: Payer: Self-pay | Admitting: *Deleted

## 2019-11-28 MED ORDER — ALENDRONATE SODIUM 70 MG PO TABS: 70.0000 mg | ORAL_TABLET | ORAL | 3 refills | Status: DC

## 2019-12-14 ENCOUNTER — Ambulatory Visit: Payer: Medicare HMO | Admitting: Psychiatry

## 2019-12-15 ENCOUNTER — Ambulatory Visit: Payer: Medicare HMO | Admitting: Family Medicine

## 2019-12-19 ENCOUNTER — Other Ambulatory Visit: Payer: Self-pay

## 2019-12-19 ENCOUNTER — Ambulatory Visit: Payer: Medicare HMO | Admitting: Family Medicine

## 2019-12-19 ENCOUNTER — Ambulatory Visit (INDEPENDENT_AMBULATORY_CARE_PROVIDER_SITE_OTHER): Payer: Medicare HMO | Admitting: Family Medicine

## 2019-12-19 ENCOUNTER — Encounter: Payer: Self-pay | Admitting: Family Medicine

## 2019-12-19 VITALS — BP 118/66 | HR 86 | Temp 98.7°F | Resp 14 | Ht 64.0 in | Wt 178.0 lb

## 2019-12-19 DIAGNOSIS — Z0000 Encounter for general adult medical examination without abnormal findings: Secondary | ICD-10-CM | POA: Diagnosis not present

## 2019-12-19 DIAGNOSIS — Z79899 Other long term (current) drug therapy: Secondary | ICD-10-CM

## 2019-12-19 DIAGNOSIS — Z1231 Encounter for screening mammogram for malignant neoplasm of breast: Secondary | ICD-10-CM

## 2019-12-19 DIAGNOSIS — F3162 Bipolar disorder, current episode mixed, moderate: Secondary | ICD-10-CM | POA: Diagnosis not present

## 2019-12-19 DIAGNOSIS — E785 Hyperlipidemia, unspecified: Secondary | ICD-10-CM

## 2019-12-19 DIAGNOSIS — M65322 Trigger finger, left index finger: Secondary | ICD-10-CM

## 2019-12-19 NOTE — Progress Notes (Signed)
Subjective:   Patient presents for Medicare Annual/Subsequent preventive examination.    Pt here for CPE  She hs lost weight intentionally down from 209 in July, now eats only 2 meals a day and exercising 4-6 times a day. States she feels much better  Left finger pain, has pain trying to bend first thing in the morning, seems like it is stuck and she has to force it, during the day not as bad    Hyerlipidemia- taking Crestor 10mg    Osteopenia noted on scan in Jan 2020, taking fosamax , not taking vitamin D, she makes it makes her feel hungy, instead eats more dairy for vitamin D   Bipolar no change in medications recently from psychiatry  Review Past Medical/Family/Social: Per EMR   Risk Factors  Current exercise habits:  4-6 day Dietary issues discussed: Yes  Cardiac risk factors: Obesity (BMI >= 30 kg/m2). Hyperlipidemia  Depression Screen  Treated  By psychiatry  (Note: if answer to either of the following is "Yes", a more complete depression screening is indicated)  Over the past two weeks, have you felt down, depressed or hopeless? No Over the past two weeks, have you felt little interest or pleasure in doing things? No Have you lost interest or pleasure in daily life? No Do you often feel hopeless? No Do you cry easily over simple problems? No   Activities of Daily Living  In your present state of health, do you have any difficulty performing the following activities?:  Driving? No  Managing money? No  Feeding yourself? No  Getting from bed to chair? No  Climbing a flight of stairs? No  Preparing food and eating?: No  Bathing or showering? No  Getting dressed: No  Getting to the toilet? No  Using the toilet:No  Moving around from place to place: No  In the past year have you fallen or had a near fall?:No  Are you sexually active? No  Do you have more than one partner? No   Hearing Difficulties: No  Do you often ask people to speak up or repeat themselves? No   Do you experience ringing or noises in your ears? No Do you have difficulty understanding soft or whispered voices? No  Do you feel that you have a problem with memory? No Do you often misplace items? No  Do you feel safe at home? Yes  Cognitive Testing  Alert? Yes Normal Appearance?Yes  Oriented to person? Yes Place? Yes  Time? Yes  Recall of three objects? Yes  Can perform simple calculations? Yes  Displays appropriate judgment?Yes  Can read the correct time from a watch face?Yes   List the Names of Other Physician/Practitioners you currently use:   Psychiatry     Screening Tests / Date Colonoscopy        UTD              Zostavax  Declines  Mammogram  Due  Influenza Vaccine  UTD Tetanus/tdap  UTD  ROS: GEN- denies fatigue, fever, weight loss,weakness, recent illness HEENT- denies eye drainage, change in vision, nasal discharge, CVS- denies chest pain, palpitations RESP- denies SOB, cough, wheeze ABD- denies N/V, change in stools, abd pain GU- denies dysuria, hematuria, dribbling, incontinence MSK- denies joint pain, muscle aches, injury Neuro- denies headache, dizziness, syncope, seizure activity  Physical - vitals reviewed  GEN- NAD, alert and oriented x3 HEENT- PERRL, EOMI, non injected sclera, pink conjunctiva, MMM, oropharynx clear Neck- Supple, no thryomegaly, no bruit  CVS-  RRR, no murmur RESP-CTAB ABD-NABS,soft,NT,ND  MSK- left hand index finger, mild swelling at MIP, stiff ROM, triggering of finger ,able to make fist  EXT- No edema Pulses- Radial, DP- 2+   Assessment:    Annual wellness medicare exam   Plan:    During the course of the visit the patient was educated and counseled about appropriate screening and preventive services including:   Mammogram- to be scheduled  By patient   Immunizations- declines shingles, planning for COVID-19 vaccine  Hyperlipidemia continue statin drug we will recheck her fasting labs today.  Bipolar disorder  followed by psychiatry.  I will check a TSH based on her medications  Obesity she is working diligently to try to decrease her weight which is made her feel better overall.  Trigger finger- discussed referral to hand surgery, will try topical NSAIDS for now     Osteoporosis now osteopenia 2020 - continue fosamax she declines vitamin D supplement  Given handout on Advanced directives  Diet review for nutrition referral? Yes ____ Not Indicated __x__  Patient Instructions (the written plan) was given to the patient.  Medicare Attestation  I have personally reviewed:  The patient's medical and social history  Their use of alcohol, tobacco or illicit drugs  Their current medications and supplements  The patient's functional ability including ADLs,fall risks, home safety risks, cognitive, and hearing and visual impairment  Diet and physical activities  Evidence for depression or mood disorders  The patient's weight, height, BMI, and visual acuity have been recorded in the chart. I have made referrals, counseling, and provided education to the patient based on review of the above and I have provided the patient with a written personalized care plan for preventive services.

## 2019-12-19 NOTE — Patient Instructions (Addendum)
F/U 6 months We will call with lab results Schedule your mammogram  Try biofreeze / voltaren gel

## 2019-12-20 ENCOUNTER — Telehealth: Payer: Self-pay | Admitting: *Deleted

## 2019-12-20 DIAGNOSIS — N6310 Unspecified lump in the right breast, unspecified quadrant: Secondary | ICD-10-CM

## 2019-12-20 DIAGNOSIS — Z1231 Encounter for screening mammogram for malignant neoplasm of breast: Secondary | ICD-10-CM

## 2019-12-20 LAB — COMPREHENSIVE METABOLIC PANEL
AG Ratio: 2.2 (calc) (ref 1.0–2.5)
ALT: 41 U/L — ABNORMAL HIGH (ref 6–29)
AST: 35 U/L (ref 10–35)
Albumin: 4.6 g/dL (ref 3.6–5.1)
Alkaline phosphatase (APISO): 119 U/L (ref 37–153)
BUN: 14 mg/dL (ref 7–25)
CO2: 28 mmol/L (ref 20–32)
Calcium: 9.5 mg/dL (ref 8.6–10.4)
Chloride: 101 mmol/L (ref 98–110)
Creat: 0.92 mg/dL (ref 0.50–1.05)
Globulin: 2.1 g/dL (calc) (ref 1.9–3.7)
Glucose, Bld: 94 mg/dL (ref 65–99)
Potassium: 4.3 mmol/L (ref 3.5–5.3)
Sodium: 138 mmol/L (ref 135–146)
Total Bilirubin: 0.6 mg/dL (ref 0.2–1.2)
Total Protein: 6.7 g/dL (ref 6.1–8.1)

## 2019-12-20 LAB — CBC WITH DIFFERENTIAL/PLATELET
Absolute Monocytes: 351 cells/uL (ref 200–950)
Basophils Absolute: 33 cells/uL (ref 0–200)
Basophils Relative: 0.5 %
Eosinophils Absolute: 72 cells/uL (ref 15–500)
Eosinophils Relative: 1.1 %
HCT: 38.5 % (ref 35.0–45.0)
Hemoglobin: 12.8 g/dL (ref 11.7–15.5)
Lymphs Abs: 2243 cells/uL (ref 850–3900)
MCH: 29.3 pg (ref 27.0–33.0)
MCHC: 33.2 g/dL (ref 32.0–36.0)
MCV: 88.1 fL (ref 80.0–100.0)
MPV: 9.7 fL (ref 7.5–12.5)
Monocytes Relative: 5.4 %
Neutro Abs: 3803 cells/uL (ref 1500–7800)
Neutrophils Relative %: 58.5 %
Platelets: 187 10*3/uL (ref 140–400)
RBC: 4.37 10*6/uL (ref 3.80–5.10)
RDW: 12.9 % (ref 11.0–15.0)
Total Lymphocyte: 34.5 %
WBC: 6.5 10*3/uL (ref 3.8–10.8)

## 2019-12-20 LAB — LIPID PANEL
Cholesterol: 152 mg/dL (ref <200)
HDL: 51 mg/dL (ref >=50)
LDL Cholesterol (Calc): 73 mg/dL (calc)
Non-HDL Cholesterol (Calc): 101 mg/dL (calc) (ref <130)
Total CHOL/HDL Ratio: 3 (calc) (ref <5.0)
Triglycerides: 190 mg/dL — ABNORMAL HIGH (ref <150)

## 2019-12-20 LAB — TSH: TSH: 1.51 mIU/L

## 2019-12-20 NOTE — Telephone Encounter (Signed)
Received call from patient.   Reports that mammogram order is incorrect. Due to cystic mass found in 2020, the recommendation for follow up is as follows:  Bilateral diagnostic mammogram and right breast ultrasound in January of 2021.  Ok to order imaging for R breast?  Please advise on L breast imaging.

## 2019-12-20 NOTE — Telephone Encounter (Signed)
Call placed to patient and patient made aware.  

## 2019-12-20 NOTE — Telephone Encounter (Signed)
Orders changed to diagnostic

## 2019-12-21 ENCOUNTER — Encounter: Payer: Self-pay | Admitting: Family Medicine

## 2019-12-21 ENCOUNTER — Other Ambulatory Visit: Payer: Self-pay | Admitting: Family Medicine

## 2019-12-21 DIAGNOSIS — N6310 Unspecified lump in the right breast, unspecified quadrant: Secondary | ICD-10-CM

## 2020-01-02 ENCOUNTER — Ambulatory Visit
Admission: RE | Admit: 2020-01-02 | Discharge: 2020-01-02 | Disposition: A | Discharge status: Home / Self Care | Payer: Medicare HMO | Source: Ambulatory Visit | Attending: Family Medicine | Admitting: Family Medicine

## 2020-01-02 DIAGNOSIS — N6312 Unspecified lump in the right breast, upper inner quadrant: Secondary | ICD-10-CM | POA: Insufficient documentation

## 2020-01-02 DIAGNOSIS — N6310 Unspecified lump in the right breast, unspecified quadrant: Secondary | ICD-10-CM

## 2020-01-02 DIAGNOSIS — Z1231 Encounter for screening mammogram for malignant neoplasm of breast: Secondary | ICD-10-CM

## 2020-01-02 DIAGNOSIS — R922 Inconclusive mammogram: Secondary | ICD-10-CM | POA: Diagnosis not present

## 2020-01-02 DIAGNOSIS — N6011 Diffuse cystic mastopathy of right breast: Secondary | ICD-10-CM | POA: Diagnosis not present

## 2020-01-03 ENCOUNTER — Ambulatory Visit (INDEPENDENT_AMBULATORY_CARE_PROVIDER_SITE_OTHER): Payer: Medicare HMO | Admitting: Psychiatry

## 2020-01-03 ENCOUNTER — Encounter: Payer: Self-pay | Admitting: Psychiatry

## 2020-01-03 ENCOUNTER — Other Ambulatory Visit: Payer: Self-pay

## 2020-01-03 DIAGNOSIS — R413 Other amnesia: Secondary | ICD-10-CM | POA: Diagnosis not present

## 2020-01-03 DIAGNOSIS — F3178 Bipolar disorder, in full remission, most recent episode mixed: Secondary | ICD-10-CM | POA: Diagnosis not present

## 2020-01-03 DIAGNOSIS — F5105 Insomnia due to other mental disorder: Secondary | ICD-10-CM | POA: Diagnosis not present

## 2020-01-03 DIAGNOSIS — F411 Generalized anxiety disorder: Secondary | ICD-10-CM | POA: Diagnosis not present

## 2020-01-03 MED ORDER — HYDROXYZINE HCL 50 MG PO TABS: ORAL_TABLET | ORAL | 0 refills | Status: DC

## 2020-01-03 MED ORDER — ESCITALOPRAM OXALATE 5 MG PO TABS: 5.0000 mg | ORAL_TABLET | Freq: Every day | ORAL | 1 refills | Status: DC

## 2020-01-03 MED ORDER — BENZTROPINE MESYLATE 1 MG PO TABS: 0.5000 mg | ORAL_TABLET | Freq: Two times a day (BID) | ORAL | 1 refills | Status: DC

## 2020-01-03 NOTE — Progress Notes (Signed)
Provider Location : [facility] Patient Location : Home  Virtual Visit via Video Note  I connected with Stacy Chambers on 01/03/20 at  9:00 AM EST by a video enabled telemedicine application and verified that I am speaking with the correct person using two identifiers.   I discussed the limitations of evaluation and management by telemedicine and the availability of in person appointments. The patient expressed understanding and agreed to proceed.   I discussed the assessment and treatment plan with the patient. The patient was provided an opportunity to ask questions and all were answered. The patient agreed with the plan and demonstrated an understanding of the instructions.   The patient was advised to call back or seek an in-person evaluation if the symptoms worsen or if the condition fails to improve as anticipated.   Stacy Chambers Progress Note  01/03/2020 1:46 PM [name]  MRN:  [id number]  Chief Complaint:  Chief Complaint    Follow-up     HPI: Stacy Chambers is a 54 year old Caucasian female, married, lives in [place], has a history of bipolar disorder, GAD, insomnia, acute neuroleptic induced akathisia, memory loss, RLS, COPD, chronic pain, abnormal liver function test was evaluated by telemedicine today.  Patient today reports she is currently doing well with regards to her mood symptoms.  She denies any significant depression, anxiety or mood lability.  She however reports recently she had one night when she felt agitated, had muscle cramps and felt very restless and could not sleep.  She took benztropine 1 mg that night for sleep however that also did not work.  She reports she felt hot that night likely due to the weather change, she is not sure.  She however reports usually she is able to sleep at night.  Patient denies any suicidality, homicidality or perceptual disturbances.  She reports she is going to the gym every day and has lost 26 pounds since September.  She is very  happy about that.  She wants to continue to do that.  She denies any other concerns today. Visit Diagnosis:    ICD-10-CM   1. Bipolar disorder, in full remission, most recent episode mixed ([place])  F31.78 benztropine (COGENTIN) 1 MG tablet  2. GAD (generalized anxiety disorder)  F41.1 escitalopram (LEXAPRO) 5 MG tablet    hydrOXYzine (ATARAX/VISTARIL) 50 MG tablet  3. Insomnia due to mental condition  F51.05   4. Memory loss  R41.3     Past Psychiatric History: I have reviewed past psychiatric history from my progress note on 10/12/2018.  Past trials of BuSpar, Zoloft, carbamazepine  Past Medical History:  Past Medical History:  Diagnosis Date  . Abnormal blood chemistry 10/03/2015  . Anginal pain ([place])   . Anxiety   . Anxiety, generalized 02/21/2015  . Bipolar affective disorder ([place]) 03/08/2013   Overview:  Last Assessment & Plan:  Continue meds, f/u with psych Restart benzo at 1 po BID prn,    . Bipolar disorder ([place])   . 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 Chronic obstructive pulmonary disease ([place]) 10/03/2015  . COPD (chronic obstructive pulmonary disease) ([facility])    no inhalers  . Elevated liver function tests 11/26/2004   H/O NEGATIVE LIVER BIOPSY, workup negative  . Gallstones 02/21/2015  . GERD (gastroesophageal reflux disease)   . Hyperlipidemia   . Insomnia   . Lower back pain   . Osteoarthritis   . Ovarian cancer ([place]) 1995  . Restless legs syndrome (RLS)   . RLS (  restless legs syndrome)   . Sedative, hypnotic or anxiolytic dependence with withdrawal, 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[place])   . Wears dentures    full upper and lower    Past Surgical History:  Procedure Laterality Date  . ABDOMINAL HYSTERECTOMY    . APPENDECTOMY    . BREAST BIOPSY Left 03/12/2015   fat necrosis  . CESAREAN SECTION    . COLONOSCOPY WITH PROPOFOL N/A 05/30/2018   Procedure: COLONOSCOPY WITH PROPOFOL;  Surgeon: Manya Silvas, MD;  Location[facility];  Service: Endoscopy;  Laterality: N/A;  . EXCISION  MORTON'S NEUROMA Left 10/07/2016   Procedure: EXCISION MORTON'S NEUROMA;  Surgeon: [name], DPM;  Location: [facility];  Service: Podiatry;  Laterality: Left;  IV WITH LOCAL  . LIVER BIOPSY  2005   PerPt: Done to eval elevated LFTs and biopsy provided no definite dx/cause of elevated LFTs  . MULTIPLE TOOTH EXTRACTIONS    . OOPHORECTOMY      Family Psychiatric History: I have reviewed family psychiatric history from my progress note on 10/12/2018  Family History:  Family History  Adopted: Yes  Problem Relation Age of Onset  . COPD Mother   . Depression Mother   . Colon cancer Mother        unsure of age  . Cancer Father        Lung CA, Skin Cancer--?type  . COPD Father   . Stroke Father   . Cancer Sister 55       cervical cancer  . COPD Brother   . Alcohol abuse Brother   . Stroke Brother   . COPD Maternal Aunt   . Depression Daughter   . Schizophrenia Son   . Breast cancer Neg Hx     Social History: Reviewed social history from my progress note on 10/12/2018 Social History   Socioeconomic History  . Marital status: Married    Spouse name: Not on file  . Number of children: Not on file  . Years of education: Not on file  . Highest education level: Not on file  Occupational History  . Not on file  Tobacco Use  . Smoking status: Former Smoker    Packs/day: 1.00    Years: 30.00    Pack years: 30.00    Types: Cigarettes    Quit date: 04/08/2009    Years since quitting: 10.7  . Smokeless tobacco: Never Used  Substance and Sexual Activity  . Alcohol use: Yes    Alcohol/week: 3.0 - 7.0 standard drinks    Types: 3 - 7 Shots of liquor per week    Comment: occasionally  . Drug use: Yes    Types: Marijuana    Comment: told to refrain until after surgery  . Sexual activity: Yes    Birth control/protection: Surgical  Other Topics Concern  . Not on file  Social History Narrative  . Not on file   Social Determinants of Health   Financial Resource  Strain:   . Difficulty of Paying Living Expenses: Not on file  Food Insecurity:   . Worried About Charity fundraiser in the Last Year: Not on file  . Ran Out of Food in the Last Year: Not on file  Transportation Needs:   . Lack of Transportation (Medical): Not on file  . Lack of Transportation (Non-Medical): Not on file  Physical Activity:   . Days of Exercise per Week: Not on file  . Minutes of Exercise per Session: Not on file  Stress:   .  Feeling of Stress : Not on file  Social Connections:   . Frequency of Communication with Friends and Family: Not on file  . Frequency of Social Gatherings with Friends and Family: Not on file  . Attends Religious Services: Not on file  . Active Member of Clubs or Organizations: Not on file  . Attends Archivist Meetings: Not on file  . Marital Status: Not on file    Allergies:  Allergies  Allergen Reactions  . Benadryl [Diphenhydramine Hcl]     Hives/ rapid heartrate  . Codeine   . Vicodin [Hydrocodone-Acetaminophen] Other (See Comments)    dizziness    Metabolic Disorder Labs: Lab Results  Component Value Date   HGBA1C 5.6 09/14/2018   MPG 114 09/14/2018   No results found for: PROLACTIN Lab Results  Component Value Date   CHOL 152 12/19/2019   TRIG 190 (H) 12/19/2019   HDL 51 12/19/2019   CHOLHDL 3.0 12/19/2019   VLDL 18 03/04/2015   LDLCALC 73 12/19/2019   LDLCALC 77 02/01/2019   Lab Results  Component Value Date   TSH 1.51 12/19/2019   TSH 2.320 10/24/2018    Therapeutic Level Labs: Lab Results  Component Value Date   LITHIUM 0.3 (L) 10/24/2018   LITHIUM 0.49 (L) 01/13/2009   No results found for: VALPROATE No components found for:  CBMZ  Current Medications: Current Outpatient Medications  Medication Sig Dispense Refill  . alendronate (FOSAMAX) 70 MG tablet Take 1 tablet (70 mg total) by mouth once a week. Take with a full glass of water on an empty stomach. 12 tablet 3  . ammonium lactate  (AMLACTIN) 12 % cream APPLY TOPICALLY AS NEEDED FOR DRY SKIN. 385 g 0  . benztropine (COGENTIN) 1 MG tablet Take 0.5 tablets (0.5 mg total) by mouth 2 (two) times daily. 90 tablet 1  . escitalopram (LEXAPRO) 5 MG tablet Take 1 tablet (5 mg total) by mouth daily. 90 tablet 1  . esomeprazole (NEXIUM) 20 MG capsule Take 1 capsule (20 mg total) by mouth daily as needed (take on empty stomach). 90 capsule 1  . fluticasone (FLONASE) 50 MCG/ACT nasal spray Place 2 sprays into both nostrils daily. 16 g 1  . hydrOXYzine (ATARAX/VISTARIL) 50 MG tablet TAKE 1 TABLET (50 MG TOTAL) BY MOUTH TWO TIMES DAILY AS NEEDED FOR SEVERE ANXIETY ATTACKS 180 tablet 0  . lamoTRIgine (LAMICTAL) 100 MG tablet Take 1 tablet (100 mg total) by mouth daily. 90 tablet 1  . meloxicam (MOBIC) 7.5 MG tablet TAKE 1 TABLET TWICE DAILY AS NEEDED FOR PAIN 180 tablet 0  . rosuvastatin (CRESTOR) 10 MG tablet TAKE 1 TABLET EVERY DAY 90 tablet 0  . traZODone (DESYREL) 100 MG tablet TAKE 1 TABLET (100 MG TOTAL) BY MOUTH AT BEDTIME. 90 tablet 1  . ziprasidone (GEODON) 80 MG capsule Take 1 capsule (80 mg total) by mouth daily with supper. 90 capsule 1   No current facility-administered medications for this visit.     Musculoskeletal: Strength & Muscle Tone: UTA Gait & Station: normal Patient leans: N/A  Psychiatric Specialty Exam: Review of Systems  Psychiatric/Behavioral: Positive for sleep disturbance (Has some bad nights).  All other systems reviewed and are negative.   There were no vitals taken for this visit.There is no height or weight on file to calculate BMI.  General Appearance: Casual  Eye Contact:  Fair  Speech:  Clear and Coherent  Volume:  Normal  Mood:  Euthymic  Affect:  Congruent  Thought Process:  Goal Directed and Descriptions of Associations: Intact  Orientation:  Full (Time, Place, and Person)  Thought Content: Logical   Suicidal Thoughts:  No  Homicidal Thoughts:  No  Memory:  Immediate;   Fair Recent;    Fair Remote;   Fair  Judgement:  Fair  Insight:  Fair  Psychomotor Activity:  Normal  Concentration:  Concentration: Fair and Attention Span: Fair  Recall:  [employer] of Knowledge: Fair  Language: Fair  Akathisia:  No  Handed:  Right  AIMS (if indicated): UTA  Assets:  Communication Skills Desire for Improvement Housing Social Support  ADL's:  Intact  Cognition: WNL  Sleep:  Restless   Screenings: AIMS     Office Visit from 10/24/2018 in [facility] Office Visit from 04/04/2018 in [facility] Office Visit from 01/03/2018 in [facility] Office Visit from 11/18/2016 in [facility] Total Score  5  0  0  0    PHQ2-9     Office Visit from 12/19/2019 in [facility] Office Visit from 10/21/2018 in [facility] Office Visit from 09/29/2018 in [facility] Office Visit from 09/14/2018 in [facility] Office Visit from 04/27/2018 in [facility]  PHQ-2 Total Score  0  4  4  5  5   PHQ-9 Total Score  --  21  21  19  20        Assessment and Plan: Stacy Chambers is a 54 year old Caucasian female, married, lives in [place], has a history of bipolar disorder, anxiety disorder, COPD, RLS, elevated liver enzyme was evaluated by telemedicine today.  Patient with psychosocial stressors of the death of her son who passed away 11 years ago, relationship struggles with her husband and other children.  She is biologically predisposed given her family history.  Patient is currently making progress except for restlessness with sleep on and off.  Plan as noted below.  Plan Bipolar disorder in remission Geodon 80 mg p.o. daily with supper.  She did not tolerate higher dosage. Cogentin half tablet twice a day as needed.  However advised patient to take Cogentin 2 mg as needed the nights that she feels restless since  she tried Cogentin 1 mg recently when that happened and it did not work.  If this is not effective advised patient to Secondary school teacher. Lamotrigine 100 mg p.o. daily  GAD-stable Lamotrigine as prescribed Hydroxyzine 50 mg p.o. twice daily as needed.  She will limit use and is aware about cognitive side effects of hydroxyzine. Lexapro 5 mg p.o. daily Patient was referred for psychotherapy sessions-Ms. Margaret Burnett-pending  Insomnia-restless Melatonin 3 to 5 mg p.o. nightly Advised to take a higher dosage of Cogentin if she has cramps and restlessness at night which she reports is likely due to Geodon.  Memory loss-unspecified Patient was referred for neurology-she has been noncompliant.  Follow-up in clinic in 2 months or sooner if needed.  I have spent atleast 20 minutes non face to face with patient today. More than 50 % of the time was spent for  ordering medications and test ,psychoeducation and supportive psychotherapy and care coordination,as well as documenting clinical information in electronic health record. This note was generated in part or whole with voice recognition software. Voice recognition is usually quite accurate but there are transcription errors that can and very often do occur. I apologize for any typographical errors that were  not detected and corrected.        Ursula Alert, MD 01/03/2020, 1:46 PM

## 2020-01-16 ENCOUNTER — Other Ambulatory Visit: Payer: Self-pay | Admitting: Family Medicine

## 2020-01-25 ENCOUNTER — Other Ambulatory Visit: Payer: Self-pay | Admitting: Psychiatry

## 2020-01-25 DIAGNOSIS — F3162 Bipolar disorder, current episode mixed, moderate: Secondary | ICD-10-CM

## 2020-02-07 ENCOUNTER — Ambulatory Visit (INDEPENDENT_AMBULATORY_CARE_PROVIDER_SITE_OTHER): Payer: Medicare HMO | Admitting: Family Medicine

## 2020-02-07 ENCOUNTER — Ambulatory Visit
Admission: RE | Admit: 2020-02-07 | Discharge: 2020-02-07 | Disposition: A | Discharge status: Home / Self Care | Payer: Medicare HMO | Source: Ambulatory Visit | Attending: Family Medicine | Admitting: Family Medicine

## 2020-02-07 ENCOUNTER — Other Ambulatory Visit: Payer: Self-pay | Admitting: Family Medicine

## 2020-02-07 ENCOUNTER — Ambulatory Visit
Admission: RE | Admit: 2020-02-07 | Discharge: 2020-02-07 | Disposition: A | Discharge status: Home / Self Care | Payer: Medicare HMO | Attending: Family Medicine | Admitting: Family Medicine

## 2020-02-07 ENCOUNTER — Other Ambulatory Visit: Payer: Self-pay

## 2020-02-07 ENCOUNTER — Encounter: Payer: Self-pay | Admitting: Family Medicine

## 2020-02-07 VITALS — BP 122/62 | HR 64 | Temp 98.2°F | Resp 16 | Ht 64.0 in | Wt 178.0 lb

## 2020-02-07 DIAGNOSIS — G8929 Other chronic pain: Secondary | ICD-10-CM | POA: Diagnosis not present

## 2020-02-07 DIAGNOSIS — M25551 Pain in right hip: Secondary | ICD-10-CM | POA: Insufficient documentation

## 2020-02-07 DIAGNOSIS — M25511 Pain in right shoulder: Secondary | ICD-10-CM

## 2020-02-07 DIAGNOSIS — M25512 Pain in left shoulder: Secondary | ICD-10-CM | POA: Insufficient documentation

## 2020-02-07 DIAGNOSIS — M25552 Pain in left hip: Secondary | ICD-10-CM | POA: Diagnosis not present

## 2020-02-07 NOTE — Progress Notes (Signed)
   Subjective:    Patient ID: Stacy Chambers, female    DOB: 08-19-1966, 54 y.o.   MRN: [id number]  Patient presents for Joint Pain (popping and pain in hips/ shoulder- keeping her up at night)  Patient here with bilateral shoulder pain right worse than left.  When she is working out at [employer] doing overhead presses and other weights she gets pain in her shoulders and a popping which causes discomfort.  She does have discomfort at nighttime so she will think for the right side.  She denies any tingling or numbness in her fingertips.  No specific injury no falls.  She also has chronic hip pain states that this is been worsening she is coughing when she is doing her crunches in the gym. Take meloxicam and glucosamine.  She has known arthritis in her knees.  Has never seen an orthopedist     [facility] does try to stay active which is why she works out.   Review Of Systems:  GEN- denies fatigue, fever, weight loss,weakness, recent illness HEENT- denies eye drainage, change in vision, nasal discharge, CVS- denies chest pain, palpitations RESP- denies SOB, cough, wheeze ABD- denies N/V, change in stools, abd pain GU- denies dysuria, hematuria, dribbling, incontinence MSK- + joint pain, muscle aches, injury Neuro- denies headache, dizziness, syncope, seizure activity       Objective:    BP 122/62   Pulse 64   Temp 98.2 F (36.8 C) (Temporal)   Resp 16   Ht 5\' 4"  (1.626 m)   Wt 178 lb (80.7 kg)   SpO2 94%   BMI 30.55 kg/m  GEN- NAD, alert and oriented x3 HEENT- PERRL, EOMI, non injected sclera, pink conjunctiva, MMM, oropharynx clear Neck- Supple, no thyromegaly CVS- RRR, no murmur RESP-CTAB MSK- FROM Upper ext, +empty can right side, neg impingment, popping with lateral elevation of right shoulder and reach behind bilat Good ROm bilat hips, TTP right lateral hip, fair ROM knees  Pulses- Radial,  2+        Assessment & Plan:      Problem List Items Addressed This Visit    None    Visit Diagnoses    Chronic pain of right hip    -  Primary   obtain xray of hip, continue NSAID, discussed ortho possible PT   Relevant Orders   DG HIPS BILAT WITH PELVIS 3-4 VIEWS   Acute pain of both shoulders       Possible tendon inflammation with the popping or mild rotator cuff injury, obtain xray to r/u DJD in shoulder first,    Relevant Orders   DG Shoulder Right   DG Shoulder Left      Note: This dictation was prepared with Dragon dictation along with smaller phrase technology. Any transcriptional errors that result from this process are unintentional.

## 2020-02-07 NOTE — Patient Instructions (Addendum)
[facility] to get xrays  F/U pending results

## 2020-02-10 ENCOUNTER — Other Ambulatory Visit: Payer: Self-pay | Admitting: Family Medicine

## 2020-02-12 ENCOUNTER — Other Ambulatory Visit: Payer: Self-pay | Admitting: *Deleted

## 2020-02-12 MED ORDER — ROSUVASTATIN CALCIUM 10 MG PO TABS: 10.0000 mg | ORAL_TABLET | Freq: Every day | ORAL | 0 refills | Status: DC

## 2020-02-12 MED ORDER — AMMONIUM LACTATE 12 % EX CREA: TOPICAL_CREAM | CUTANEOUS | 0 refills | Status: DC | PRN

## 2020-03-04 ENCOUNTER — Encounter: Payer: Self-pay | Admitting: Psychiatry

## 2020-03-04 ENCOUNTER — Telehealth (INDEPENDENT_AMBULATORY_CARE_PROVIDER_SITE_OTHER): Payer: Medicare HMO | Admitting: Psychiatry

## 2020-03-04 ENCOUNTER — Other Ambulatory Visit: Payer: Self-pay

## 2020-03-04 DIAGNOSIS — F3178 Bipolar disorder, in full remission, most recent episode mixed: Secondary | ICD-10-CM | POA: Diagnosis not present

## 2020-03-04 DIAGNOSIS — F411 Generalized anxiety disorder: Secondary | ICD-10-CM | POA: Diagnosis not present

## 2020-03-04 DIAGNOSIS — F5105 Insomnia due to other mental disorder: Secondary | ICD-10-CM | POA: Diagnosis not present

## 2020-03-04 DIAGNOSIS — R413 Other amnesia: Secondary | ICD-10-CM

## 2020-03-04 MED ORDER — TRAZODONE HCL 100 MG PO TABS: 100.0000 mg | ORAL_TABLET | Freq: Every day | ORAL | 1 refills | Status: DC

## 2020-03-04 NOTE — Progress Notes (Signed)
Provider Location : [facility] Patient Location : Home  Virtual Visit via Video Note  I connected with Stacy Chambers on 03/04/20 at 11:00 AM EDT by a video enabled telemedicine application and verified that I am speaking with the correct person using two identifiers.   I discussed the limitations of evaluation and management by telemedicine and the availability of in person appointments. The patient expressed understanding and agreed to proceed.     I discussed the assessment and treatment plan with the patient. The patient was provided an opportunity to ask questions and all were answered. The patient agreed with the plan and demonstrated an understanding of the instructions.   The patient was advised to call back or seek an in-person evaluation if the symptoms worsen or if the condition fails to improve as anticipated.   [place] MD OP Progress Note  03/04/2020 12:33 PM Stacy Chambers  MRN:  [id number]  Chief Complaint:  Chief Complaint    Follow-up     HPI: Stacy Chambers is a 54 year old Caucasian female, married, lives in [place], has a history of bipolar disorder, GAD, insomnia, acute neuroleptic induced akathisia, memory loss, RLS, COPD, chronic pain, abnormal liver function test was evaluated by telemedicine today.  Patient today reports she is currently doing well with regards to her mood.  She denies any significant anxiety or depressive symptoms.  She denies any mood swings.  She reports sleep is good most of the night.  She reports she does have restlessness on and off however she has been taking the Cogentin as needed when she has muscle cramps or restlessness.  That does help.  She is compliant on medications and denies side effects.  Patient denies any suicidality, homicidality or perceptual disturbances.  Patient reports she had a very good Mother's Day with her children.  Patient denies any other concerns today. Visit Diagnosis:    ICD-10-CM   1. Bipolar disorder, in full  remission, most recent episode mixed ([place])  F31.78 traZODone (DESYREL) 100 MG tablet  2. GAD (generalized anxiety disorder)  F41.1   3. Insomnia due to mental condition  F51.05   4. Memory loss  R41.3     Past Psychiatric History: I have reviewed past psychiatric history from my progress note on 10/12/2018.  Past trials of BuSpar, Zoloft, carbamazepine.  Past Medical History:  Past Medical History:  Diagnosis Date  . Abnormal blood chemistry 10/03/2015  . Anginal pain ([place])   . Anxiety   . Anxiety, generalized 02/21/2015  . Bipolar affective disorder ([place]) 03/08/2013   Overview:  Last Assessment & Plan:  Continue meds, f/u with psych Restart benzo at 1 po BID prn,    . Bipolar disorder ([place])   . 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 Chronic obstructive pulmonary disease ([place]) 10/03/2015  . COPD (chronic obstructive pulmonary disease) ([facility])    no inhalers  . Elevated liver function tests 11/26/2004   H/O NEGATIVE LIVER BIOPSY, workup negative  . Gallstones 02/21/2015  . GERD (gastroesophageal reflux disease)   . Hyperlipidemia   . Insomnia   . Lower back pain   . Osteoarthritis   . Ovarian cancer ([place]) 1995  . Restless legs syndrome (RLS)   . RLS (restless legs syndrome)   . Sedative, hypnotic or anxiolytic dependence with withdrawal, 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[place])   . Wears dentures    full upper and lower    Past Surgical History:  Procedure Laterality Date  . ABDOMINAL HYSTERECTOMY    . APPENDECTOMY    .  BREAST BIOPSY Left 03/12/2015   fat necrosis  . CESAREAN SECTION    . COLONOSCOPY WITH PROPOFOL N/A 05/30/2018   Procedure: COLONOSCOPY WITH PROPOFOL;  Surgeon: Manya Silvas, MD;  Location: [facility] ENDOSCOPY;  Service: Endoscopy;  Laterality: N/A;  . EXCISION MORTON'S NEUROMA Left 10/07/2016   Procedure: EXCISION MORTON'S NEUROMA;  Surgeon: [name], DPM;  Location: [facility];  Service: Podiatry;  Laterality: Left;  IV WITH LOCAL  . LIVER BIOPSY  2005   PerPt: Done to eval  elevated LFTs and biopsy provided no definite dx/cause of elevated LFTs  . MULTIPLE TOOTH EXTRACTIONS    . OOPHORECTOMY      Family Psychiatric History: I have reviewed family psychiatric history from my progress note on 10/12/2018.  Family History:  Family History  Adopted: Yes  Problem Relation Age of Onset  . COPD Mother   . Depression Mother   . Colon cancer Mother        unsure of age  . Cancer Father        Lung CA, Skin Cancer--?type  . COPD Father   . Stroke Father   . Cancer Sister 63       cervical cancer  . COPD Brother   . Alcohol abuse Brother   . Stroke Brother   . COPD Maternal Aunt   . Depression Daughter   . Schizophrenia Son   . Breast cancer Neg Hx     Social History: Reviewed social history from my progress note on 10/12/2018. Social History   Socioeconomic History  . Marital status: Married    Spouse name: Not on file  . Number of children: Not on file  . Years of education: Not on file  . Highest education level: Not on file  Occupational History  . Not on file  Tobacco Use  . Smoking status: Former Smoker    Packs/day: 1.00    Years: 30.00    Pack years: 30.00    Types: Cigarettes    Quit date: 04/08/2009    Years since quitting: 10.9  . Smokeless tobacco: Never Used  Substance and Sexual Activity  . Alcohol use: Yes    Alcohol/week: 3.0 - 7.0 standard drinks    Types: 3 - 7 Shots of liquor per week    Comment: occasionally  . Drug use: Yes    Types: Marijuana    Comment: told to refrain until after surgery  . Sexual activity: Yes    Birth control/protection: Surgical  Other Topics Concern  . Not on file  Social History Narrative  . Not on file   Social Determinants of Health   Financial Resource Strain:   . Difficulty of Paying Living Expenses:   Food Insecurity:   . Worried About Charity fundraiser in the Last Year:   . Arboriculturist in the Last Year:   Transportation Needs:   . Film/video editor (Medical):   Marland Kitchen  Lack of Transportation (Non-Medical):   Physical Activity:   . Days of Exercise per Week:   . Minutes of Exercise per Session:   Stress:   . Feeling of Stress :   Social Connections:   . Frequency of Communication with Friends and Family:   . Frequency of Social Gatherings with Friends and Family:   . Attends Religious Services:   . Active Member of Clubs or Organizations:   . Attends Archivist Meetings:   Marland Kitchen Marital Status:     Allergies:  Allergies  Allergen Reactions  . Benadryl [Diphenhydramine Hcl]     Hives/ rapid heartrate  . Codeine   . Vicodin [Hydrocodone-Acetaminophen] Other (See Comments)    dizziness    Metabolic Disorder Labs: Lab Results  Component Value Date   HGBA1C 5.6 09/14/2018   MPG 114 09/14/2018   No results found for: PROLACTIN Lab Results  Component Value Date   CHOL 152 12/19/2019   TRIG 190 (H) 12/19/2019   HDL 51 12/19/2019   CHOLHDL 3.0 12/19/2019   VLDL 18 03/04/2015   LDLCALC 73 12/19/2019   LDLCALC 77 02/01/2019   Lab Results  Component Value Date   TSH 1.51 12/19/2019   TSH 2.320 10/24/2018    Therapeutic Level Labs: Lab Results  Component Value Date   LITHIUM 0.3 (L) 10/24/2018   LITHIUM 0.49 (L) 01/13/2009   No results found for: VALPROATE No components found for:  CBMZ  Current Medications: Current Outpatient Medications  Medication Sig Dispense Refill  . alendronate (FOSAMAX) 70 MG tablet Take 1 tablet (70 mg total) by mouth once a week. Take with a full glass of water on an empty stomach. 12 tablet 3  . ammonium lactate (AMLACTIN) 12 % cream Apply topically as needed for dry skin. 385 g 0  . benztropine (COGENTIN) 1 MG tablet Take 0.5 tablets (0.5 mg total) by mouth 2 (two) times daily. 90 tablet 1  . escitalopram (LEXAPRO) 5 MG tablet Take 1 tablet (5 mg total) by mouth daily. 90 tablet 1  . esomeprazole (NEXIUM) 20 MG capsule Take 1 capsule (20 mg total) by mouth daily as needed (take on empty stomach).  90 capsule 1  . fluticasone (FLONASE) 50 MCG/ACT nasal spray SPRAY 2 SPRAYS INTO EACH NOSTRIL EVERY DAY 16 mL 1  . Glucosamine-Chondroitin (GLUCOSAMINE CHONDR COMPLEX PO) Take 1 capsule by mouth in the morning and at bedtime.    . hydrOXYzine (ATARAX/VISTARIL) 50 MG tablet TAKE 1 TABLET (50 MG TOTAL) BY MOUTH TWO TIMES DAILY AS NEEDED FOR SEVERE ANXIETY ATTACKS 180 tablet 0  . lamoTRIgine (LAMICTAL) 100 MG tablet Take 1 tablet (100 mg total) by mouth daily. 90 tablet 1  . meloxicam (MOBIC) 7.5 MG tablet TAKE 1 TABLET TWICE DAILY AS NEEDED FOR PAIN 180 tablet 0  . Omega-3 Fatty Acids (FISH OIL) 1000 MG CAPS Take by mouth.    . rosuvastatin (CRESTOR) 10 MG tablet Take 1 tablet (10 mg total) by mouth daily. 90 tablet 0  . traZODone (DESYREL) 100 MG tablet Take 1 tablet (100 mg total) by mouth at bedtime. 90 tablet 1  . ziprasidone (GEODON) 80 MG capsule Take 1 capsule (80 mg total) by mouth daily with supper. 90 capsule 1   No current facility-administered medications for this visit.     Musculoskeletal: Strength & Muscle Tone: UTA Gait & Station: normal Patient leans: N/A  Psychiatric Specialty Exam: Review of Systems  Psychiatric/Behavioral: Negative for agitation, behavioral problems, confusion, decreased concentration, dysphoric mood, hallucinations, self-injury, sleep disturbance and suicidal ideas. The patient is not nervous/anxious and is not hyperactive.   All other systems reviewed and are negative.   There were no vitals taken for this visit.There is no height or weight on file to calculate BMI.  General Appearance: Casual  Eye Contact:  Fair  Speech:  Normal Rate  Volume:  Normal  Mood:  Euthymic  Affect:  Congruent  Thought Process:  Goal Directed and Descriptions of Associations: Intact  Orientation:  Full (Time, Place, and Person)  Thought Content: Logical   Suicidal Thoughts:  No  Homicidal Thoughts:  No  Memory:  Immediate;   Fair Recent;   Fair Remote;   Fair   Judgement:  Fair  Insight:  Fair  Psychomotor Activity:  Normal  Concentration:  Concentration: Fair and Attention Span: Fair  Recall:  [employer] of Knowledge: Fair  Language: Fair  Akathisia:  No  Handed:  Right  AIMS (if indicated): UTA  Assets:  Communication Skills Desire for Improvement Housing Social Support  ADL's:  Intact  Cognition: WNL  Sleep:  Fair   Screenings: AIMS     Office Visit from 10/24/2018 in [facility] Office Visit from 04/04/2018 in [facility] Office Visit from 01/03/2018 in [facility] Office Visit from 11/18/2016 in [facility] Total Score  5  0  0  0    PHQ2-9     Office Visit from 02/07/2020 in [facility] Office Visit from 12/19/2019 in [facility] Office Visit from 10/21/2018 in [facility] Office Visit from 09/29/2018 in [facility] Office Visit from 09/14/2018 in [facility]  PHQ-2 Total Score  0  0  4  4  5   PHQ-9 Total Score  --  --  21  21  19        Assessment and Plan: Kasen is a 54 year old Caucasian female, married, lives in [place], has a history of bipolar disorder, anxiety disorder, COPD, RLS, elevated liver enzymes was evaluated by telemedicine today.  Patient with psychosocial stressors of the death of her son who passed away 11 years ago, relationship struggles with her husband and other children.  Patient however is currently making progress, she is stable on medications.  Plan as noted below.  Plan Bipolar disorder in remission Geodon 80 mg p.o. daily with supper.  She did not tolerate the higher dosage. Cogentin 0.5 to 2 mg as needed daily for side effects of Geodon. Lamotrigine 100 mg p.o. daily  GAD-stable Lamotrigine as prescribed Hydroxyzine 50 mg p.o. twice daily as needed Patient advised to limit use. Lexapro 5 mg  p.o. daily   Insomnia-improving Melatonin 3 to 5 mg p.o. nightly   Memory loss-unspecified Patient was referred for neurology consultation-she has been noncompliant We will monitor closely  Follow-up in clinic in 2 to 3 months or sooner if needed.  I have spent atleast 20 minutes non face to face with patient today. More than 50 % of the time was spent for preparing to see the patient ( e.g., review of test, records ), ordering medications and test ,psychoeducation and supportive psychotherapy and care coordination,as well as documenting clinical information in electronic health record. This note was generated in part or whole with voice recognition software. Voice recognition is usually quite accurate but there are transcription errors that can and very often do occur. I apologize for any typographical errors that were not detected and corrected.       Ursula Alert, MD 03/04/2020, 12:33 PM

## 2020-03-07 ENCOUNTER — Other Ambulatory Visit: Payer: Self-pay | Admitting: Family Medicine

## 2020-03-19 DIAGNOSIS — M2011 Hallux valgus (acquired), right foot: Secondary | ICD-10-CM | POA: Diagnosis not present

## 2020-04-02 ENCOUNTER — Encounter: Payer: Self-pay | Admitting: Podiatry

## 2020-04-02 ENCOUNTER — Other Ambulatory Visit: Payer: Self-pay

## 2020-04-04 ENCOUNTER — Other Ambulatory Visit: Payer: Self-pay | Admitting: Podiatry

## 2020-04-04 DIAGNOSIS — M2011 Hallux valgus (acquired), right foot: Secondary | ICD-10-CM | POA: Diagnosis not present

## 2020-04-08 ENCOUNTER — Other Ambulatory Visit
Admission: RE | Admit: 2020-04-08 | Discharge: 2020-04-08 | Disposition: A | Discharge status: Home / Self Care | Payer: Medicare HMO | Source: Ambulatory Visit | Attending: Podiatry | Admitting: Podiatry

## 2020-04-08 DIAGNOSIS — Z885 Allergy status to narcotic agent status: Secondary | ICD-10-CM | POA: Diagnosis not present

## 2020-04-08 DIAGNOSIS — Z888 Allergy status to other drugs, medicaments and biological substances status: Secondary | ICD-10-CM | POA: Diagnosis not present

## 2020-04-08 DIAGNOSIS — Z87891 Personal history of nicotine dependence: Secondary | ICD-10-CM | POA: Diagnosis not present

## 2020-04-08 DIAGNOSIS — K219 Gastro-esophageal reflux disease without esophagitis: Secondary | ICD-10-CM | POA: Diagnosis not present

## 2020-04-08 DIAGNOSIS — J449 Chronic obstructive pulmonary disease, unspecified: Secondary | ICD-10-CM | POA: Diagnosis not present

## 2020-04-08 DIAGNOSIS — Z20822 Contact with and (suspected) exposure to covid-19: Secondary | ICD-10-CM | POA: Diagnosis not present

## 2020-04-08 DIAGNOSIS — F319 Bipolar disorder, unspecified: Secondary | ICD-10-CM | POA: Diagnosis not present

## 2020-04-08 DIAGNOSIS — Z79899 Other long term (current) drug therapy: Secondary | ICD-10-CM | POA: Diagnosis not present

## 2020-04-08 DIAGNOSIS — M2011 Hallux valgus (acquired), right foot: Secondary | ICD-10-CM | POA: Diagnosis not present

## 2020-04-08 LAB — SARS CORONAVIRUS 2 (TAT 6-24 HRS): SARS Coronavirus 2: NEGATIVE

## 2020-04-10 ENCOUNTER — Encounter: Payer: Self-pay | Admitting: Podiatry

## 2020-04-10 ENCOUNTER — Ambulatory Visit: Payer: Medicare HMO | Admitting: Anesthesiology

## 2020-04-10 ENCOUNTER — Other Ambulatory Visit: Payer: Self-pay

## 2020-04-10 ENCOUNTER — Ambulatory Visit
Admission: RE | Admit: 2020-04-10 | Discharge: 2020-04-10 | Disposition: A | Discharge status: Home / Self Care | Payer: Medicare HMO | Attending: Podiatry | Admitting: Podiatry

## 2020-04-10 ENCOUNTER — Encounter
Admission: RE | Disposition: A | Discharge status: Home / Self Care | Payer: Self-pay | Source: Home / Self Care | Attending: Podiatry

## 2020-04-10 DIAGNOSIS — Z885 Allergy status to narcotic agent status: Secondary | ICD-10-CM | POA: Diagnosis not present

## 2020-04-10 DIAGNOSIS — Z20822 Contact with and (suspected) exposure to covid-19: Secondary | ICD-10-CM | POA: Insufficient documentation

## 2020-04-10 DIAGNOSIS — Z79899 Other long term (current) drug therapy: Secondary | ICD-10-CM | POA: Insufficient documentation

## 2020-04-10 DIAGNOSIS — K219 Gastro-esophageal reflux disease without esophagitis: Secondary | ICD-10-CM | POA: Diagnosis not present

## 2020-04-10 DIAGNOSIS — M2011 Hallux valgus (acquired), right foot: Secondary | ICD-10-CM | POA: Diagnosis not present

## 2020-04-10 DIAGNOSIS — Z888 Allergy status to other drugs, medicaments and biological substances status: Secondary | ICD-10-CM | POA: Insufficient documentation

## 2020-04-10 DIAGNOSIS — J449 Chronic obstructive pulmonary disease, unspecified: Secondary | ICD-10-CM | POA: Insufficient documentation

## 2020-04-10 DIAGNOSIS — F319 Bipolar disorder, unspecified: Secondary | ICD-10-CM | POA: Diagnosis not present

## 2020-04-10 DIAGNOSIS — Z87891 Personal history of nicotine dependence: Secondary | ICD-10-CM | POA: Insufficient documentation

## 2020-04-10 HISTORY — PX: OSTECTOMY: SHX6439

## 2020-04-10 SURGERY — OSTECTOMY
Anesthesia: General | Site: Foot | Laterality: Right

## 2020-04-10 MED ORDER — BUPIVACAINE LIPOSOME 1.3 % IJ SUSP
INTRAMUSCULAR | Status: DC | PRN
  Administered 2020-04-10: 5 mL
  Administered 2020-04-10: 15 mL

## 2020-04-10 MED ORDER — FENTANYL CITRATE (PF) 100 MCG/2ML IJ SOLN
INTRAMUSCULAR | Status: DC | PRN
  Administered 2020-04-10 (×2): 12.5 ug via INTRAVENOUS
  Administered 2020-04-10: 50 ug via INTRAVENOUS

## 2020-04-10 MED ORDER — DEXAMETHASONE SODIUM PHOSPHATE 4 MG/ML IJ SOLN
INTRAMUSCULAR | Status: DC | PRN
  Administered 2020-04-10: 4 mg via INTRAVENOUS

## 2020-04-10 MED ORDER — ACETAMINOPHEN 10 MG/ML IV SOLN
1000.0000 mg | Freq: Once | INTRAVENOUS | Status: AC
  Administered 2020-04-10: 1000 mg via INTRAVENOUS

## 2020-04-10 MED ORDER — ACETAMINOPHEN 160 MG/5ML PO SOLN: 325.0000 mg | ORAL | Status: DC | PRN

## 2020-04-10 MED ORDER — MIDAZOLAM HCL 5 MG/5ML IJ SOLN
INTRAMUSCULAR | Status: DC | PRN
  Administered 2020-04-10: 1 mg via INTRAVENOUS

## 2020-04-10 MED ORDER — EPHEDRINE SULFATE 50 MG/ML IJ SOLN
INTRAMUSCULAR | Status: DC | PRN
Start: 2020-04-10 — End: 2020-04-10
  Administered 2020-04-10 (×5): 5 mg via INTRAVENOUS

## 2020-04-10 MED ORDER — PROPOFOL 10 MG/ML IV BOLUS
INTRAVENOUS | Status: DC | PRN
  Administered 2020-04-10: 120 mg via INTRAVENOUS

## 2020-04-10 MED ORDER — ONDANSETRON HCL 4 MG/2ML IJ SOLN: 4.0000 mg | Freq: Once | INTRAMUSCULAR | Status: DC | PRN

## 2020-04-10 MED ORDER — LACTATED RINGERS IV SOLN
10.0000 mL/h | INTRAVENOUS | Status: DC
  Administered 2020-04-10: 10 mL/h via INTRAVENOUS

## 2020-04-10 MED ORDER — CEFAZOLIN SODIUM-DEXTROSE 2-4 GM/100ML-% IV SOLN
2.0000 g | INTRAVENOUS | Status: AC
  Administered 2020-04-10: 2 g via INTRAVENOUS

## 2020-04-10 MED ORDER — POVIDONE-IODINE 7.5 % EX SOLN: Freq: Once | CUTANEOUS | Status: AC

## 2020-04-10 MED ORDER — OXYCODONE-ACETAMINOPHEN 5-325 MG PO TABS: 1.0000 | ORAL_TABLET | Freq: Four times a day (QID) | ORAL | 0 refills | Status: DC | PRN

## 2020-04-10 MED ORDER — LIDOCAINE HCL (CARDIAC) PF 100 MG/5ML IV SOSY
PREFILLED_SYRINGE | INTRAVENOUS | Status: DC | PRN
  Administered 2020-04-10: 40 mg via INTRATRACHEAL

## 2020-04-10 MED ORDER — ACETAMINOPHEN 325 MG PO TABS: 325.0000 mg | ORAL_TABLET | ORAL | Status: DC | PRN

## 2020-04-10 MED ORDER — GLYCOPYRROLATE 0.2 MG/ML IJ SOLN
INTRAMUSCULAR | Status: DC | PRN
  Administered 2020-04-10: .1 mg via INTRAVENOUS

## 2020-04-10 MED ORDER — FENTANYL CITRATE (PF) 100 MCG/2ML IJ SOLN: 25.0000 ug | INTRAMUSCULAR | Status: DC | PRN

## 2020-04-10 MED ORDER — PROMETHAZINE HCL 25 MG/ML IJ SOLN
INTRAMUSCULAR | Status: DC | PRN
  Administered 2020-04-10: 6.25 mg via INTRAVENOUS

## 2020-04-10 MED ORDER — BUPIVACAINE HCL (PF) 0.25 % IJ SOLN
INTRAMUSCULAR | Status: DC | PRN
  Administered 2020-04-10: 10 mL

## 2020-04-10 SURGICAL SUPPLY — 43 items
BENZOIN TINCTURE PRP APPL 2/3 (GAUZE/BANDAGES/DRESSINGS) ×2 IMPLANT
BIT DRILL 1.7 LNG CANN (DRILL) ×2 IMPLANT
BLADE MED AGGRESSIVE (BLADE) ×2 IMPLANT
BNDG COHESIVE 4X5 TAN STRL (GAUZE/BANDAGES/DRESSINGS) ×2 IMPLANT
BNDG ESMARK 4X12 TAN STRL LF (GAUZE/BANDAGES/DRESSINGS) ×2 IMPLANT
BNDG GAUZE 4.5X4.1 6PLY STRL (MISCELLANEOUS) ×2 IMPLANT
BNDG STRETCH 4X75 STRL LF (GAUZE/BANDAGES/DRESSINGS) ×2 IMPLANT
CANISTER SUCT 1200ML W/VALVE (MISCELLANEOUS) ×2 IMPLANT
COUNTERSINK HEADED 2.5 (ORTHOPEDIC DISPOSABLE SUPPLIES) ×2
COVER LIGHT HANDLE UNIVERSAL (MISCELLANEOUS) ×4 IMPLANT
CUFF TOURN SGL QUICK 18X4 (TOURNIQUET CUFF) ×2 IMPLANT
DRAPE FLUOR MINI C-ARM 54X84 (DRAPES) ×2 IMPLANT
DURAPREP 26ML APPLICATOR (WOUND CARE) ×2 IMPLANT
ELECT REM PT RETURN 9FT ADLT (ELECTROSURGICAL) ×2
ELECTRODE REM PT RTRN 9FT ADLT (ELECTROSURGICAL) ×1 IMPLANT
GAUZE SPONGE 4X4 12PLY STRL (GAUZE/BANDAGES/DRESSINGS) ×2 IMPLANT
GAUZE XEROFORM 1X8 LF (GAUZE/BANDAGES/DRESSINGS) ×2 IMPLANT
GLOVE BIO SURGEON STRL SZ7.5 (GLOVE) ×2 IMPLANT
GLOVE INDICATOR 8.0 STRL GRN (GLOVE) ×2 IMPLANT
GOWN STRL REUS W/ TWL LRG LVL3 (GOWN DISPOSABLE) ×2 IMPLANT
GOWN STRL REUS W/TWL LRG LVL3 (GOWN DISPOSABLE) ×2
K-WIRE DBL END TROCAR 6X.045 (WIRE) ×2
K-WIRE DBL END TROCAR 6X.062 (WIRE) ×2
KIT PROCEDURE DRILL (DRILL) ×2 IMPLANT
KIT TURNOVER KIT A (KITS) ×2 IMPLANT
KWIRE DBL END TROCAR 6X.045 (WIRE) ×1 IMPLANT
KWIRE DBL END TROCAR 6X.062 (WIRE) ×1 IMPLANT
NS IRRIG 500ML POUR BTL (IV SOLUTION) ×2 IMPLANT
PACK EXTREMITY ARMC (MISCELLANEOUS) ×2 IMPLANT
PENCIL SMOKE EVACUATOR (MISCELLANEOUS) ×2 IMPLANT
RASP SM TEAR CROSS CUT (RASP) ×2 IMPLANT
SCREW CANN HDLS ST 2.5X18 (Screw) ×2 IMPLANT
SCREW COUNTERSINK HEADED 2.5 (ORTHOPEDIC DISPOSABLE SUPPLIES) ×1 IMPLANT
STAPLE DYNACLIP 8X8 (Staple) ×2 IMPLANT
STOCKINETTE IMPERVIOUS LG (DRAPES) ×2 IMPLANT
STRIP CLOSURE SKIN 1/4X4 (GAUZE/BANDAGES/DRESSINGS) ×2 IMPLANT
SUT MNCRL+ 5-0 UNDYED PC-3 (SUTURE) ×1 IMPLANT
SUT MONOCRYL 5-0 (SUTURE) ×1
SUT VIC AB 3-0 SH 27 (SUTURE) ×1
SUT VIC AB 3-0 SH 27X BRD (SUTURE) ×1 IMPLANT
SUT VIC AB 4-0 FS2 27 (SUTURE) ×2 IMPLANT
WIRE SMOOTH TROCAR .9MMX150MML (WIRE) ×2 IMPLANT
k-wire ×2 IMPLANT

## 2020-04-10 NOTE — Anesthesia Postprocedure Evaluation (Signed)
Anesthesia Post Note  Patient: Stacy Chambers  Procedure(s) Performed: DOUBLE OSTEOTOMY GENERAL W/ LOCAL (Right Foot)     Patient location during evaluation: PACU Anesthesia Type: General Level of consciousness: awake and alert and oriented Pain management: satisfactory to patient Vital Signs Assessment: post-procedure vital signs reviewed and stable Respiratory status: spontaneous breathing, nonlabored ventilation and respiratory function stable Cardiovascular status: blood pressure returned to baseline and stable Postop Assessment: Adequate PO intake and No signs of nausea or vomiting Anesthetic complications: no   No complications documented.  Raliegh Ip

## 2020-04-10 NOTE — Anesthesia Preprocedure Evaluation (Signed)
Anesthesia Evaluation  Patient identified by MRN, date of birth, ID band Patient awake    Reviewed: Allergy & Precautions, H&P , NPO status , Patient's Chart, lab work & pertinent test results  Airway Mallampati: I  TM Distance: >3 FB Neck ROM: full    Dental  (+) Edentulous Lower, Edentulous Upper   Pulmonary COPD, former smoker,    Pulmonary exam normal breath sounds clear to auscultation       Cardiovascular Normal cardiovascular exam Rhythm:regular Rate:Normal     Neuro/Psych PSYCHIATRIC DISORDERS Bipolar Disorder    GI/Hepatic GERD  ,  Endo/Other    Renal/GU      Musculoskeletal   Abdominal   Peds  Hematology   Anesthesia Other Findings   Reproductive/Obstetrics                             Anesthesia Physical Anesthesia Plan  ASA: III  Anesthesia Plan: General LMA   Post-op Pain Management:    Induction:   PONV Risk Score and Plan: 3 and Treatment may vary due to age or medical condition, Ondansetron, Dexamethasone and Midazolam  Airway Management Planned:   Additional Equipment:   Intra-op Plan:   Post-operative Plan:   Informed Consent: I have reviewed the patients History and Physical, chart, labs and discussed the procedure including the risks, benefits and alternatives for the proposed anesthesia with the patient or authorized representative who has indicated his/her understanding and acceptance.     Dental Advisory Given  Plan Discussed with: CRNA  Anesthesia Plan Comments:         Anesthesia Quick Evaluation

## 2020-04-10 NOTE — Transfer of Care (Signed)
Immediate Anesthesia Transfer of Care Note  Patient: Stacy Chambers  Procedure(s) Performed: DOUBLE OSTEOTOMY GENERAL W/ LOCAL (Right Foot)  Patient Location: PACU  Anesthesia Type: General LMA  Level of Consciousness: awake, alert  and patient cooperative  Airway and Oxygen Therapy: Patient Spontanous Breathing and Patient connected to supplemental oxygen  Post-op Assessment: Post-op Vital signs reviewed, Patient's Cardiovascular Status Stable, Respiratory Function Stable, Patent Airway and No signs of Nausea or vomiting  Post-op Vital Signs: Reviewed and stable  Complications: No complications documented.

## 2020-04-10 NOTE — Discharge Instructions (Signed)
[facility]  POST OPERATIVE INSTRUCTIONS FOR DR. TROXLER, DR. FOWLER, AND DR. [facility] PODIATRY DEPARTMENT   1. Take your medication as prescribed.  Pain medication should be taken only as needed.  2. Keep the dressing clean, dry and intact.  3. Keep your foot elevated above the heart level for the first 48 hours.  4. Walking to the bathroom and brief periods of walking are acceptable, unless we have instructed you to be non-weight bearing.  5. Always wear your post-op shoe when walking.  Always use your crutches if you are to be non-weight bearing.  6. Do not take a shower. Baths are permissible as long as the foot is kept out of the water.   7. Every hour you are awake:  - Bend your knee 15 times. - Flex foot 15 times - Massage calf 15 times  8. Call [facility] ([phone]) if any of the following problems occur: - You develop a temperature or fever. - The bandage becomes saturated with blood. - Medication does not stop your pain. - Injury of the foot occurs. - Any symptoms of infection including redness, odor, or red streaks running from wound.  Information for Discharge Teaching: EXPAREL (bupivacaine liposome injectable suspension)   Your surgeon or anesthesiologist gave you EXPAREL(bupivacaine) to help control your pain after surgery.   EXPAREL is a local anesthetic that provides pain relief by numbing the tissue around the surgical site.  EXPAREL is designed to release pain medication over time and can control pain for up to 72 hours.  Depending on how you respond to EXPAREL, you may require less pain medication during your recovery.  Possible side effects:  Temporary loss of sensation or ability to move in the area where bupivacaine was injected.  Nausea, vomiting, constipation  Rarely, numbness and tingling in your mouth or lips, lightheadedness, or anxiety may occur.  Call your doctor right away  if you think you may be experiencing any of these sensations, or if you have other questions regarding possible side effects.  Follow all other discharge instructions given to you by your surgeon or nurse. Eat a healthy diet and drink plenty of water or other fluids.  If you return to the hospital for any reason within 96 hours following the administration of EXPAREL, it is important for health care providers to know that you have received this anesthetic. A teal colored band has been placed on your arm with the date, time and amount of EXPAREL you have received in order to alert and inform your health care providers. Please leave this armband in place for the full 96 hours following administration, and then you may remove the band.  General Anesthesia, Adult, Care After This sheet gives you information about how to care for yourself after your procedure. Your health care provider may also give you more specific instructions. If you have problems or questions, contact your health care provider. What can I expect after the procedure? After the procedure, the following side effects are common:  Pain or discomfort at the IV site.  Nausea.  Vomiting.  Sore throat.  Trouble concentrating.  Feeling cold or chills.  Weak or tired.  Sleepiness and fatigue.  Soreness and body aches. These side effects can affect parts of the body that were not involved in surgery. Follow these instructions at home:  For at least 24 hours after the procedure:  Have a responsible adult stay with you. It is important to   have someone help care for you until you are awake and alert.  Rest as needed.  Do not: ? Participate in activities in which you could fall or become injured. ? Drive. ? Use heavy machinery. ? Drink alcohol. ? Take sleeping pills or medicines that cause drowsiness. ? Make important decisions or sign legal documents. ? Take care of children on your own. Eating and drinking  Follow any  instructions from your health care provider about eating or drinking restrictions.  When you feel hungry, start by eating small amounts of foods that are soft and easy to digest (bland), such as toast. Gradually return to your regular diet.  Drink enough fluid to keep your urine pale yellow.  If you vomit, rehydrate by drinking water, juice, or clear broth. General instructions  If you have sleep apnea, surgery and certain medicines can increase your risk for breathing problems. Follow instructions from your health care provider about wearing your sleep device: ? Anytime you are sleeping, including during daytime naps. ? While taking prescription pain medicines, sleeping medicines, or medicines that make you drowsy.  Return to your normal activities as told by your health care provider. Ask your health care provider what activities are safe for you.  Take over-the-counter and prescription medicines only as told by your health care provider.  If you smoke, do not smoke without supervision.  Keep all follow-up visits as told by your health care provider. This is important. Contact a health care provider if:  You have nausea or vomiting that does not get better with medicine.  You cannot eat or drink without vomiting.  You have pain that does not get better with medicine.  You are unable to pass urine.  You develop a skin rash.  You have a fever.  You have redness around your IV site that gets worse. Get help right away if:  You have difficulty breathing.  You have chest pain.  You have blood in your urine or stool, or you vomit blood. Summary  After the procedure, it is common to have a sore throat or nausea. It is also common to feel tired.  Have a responsible adult stay with you for the first 24 hours after general anesthesia. It is important to have someone help care for you until you are awake and alert.  When you feel hungry, start by eating small amounts of foods  that are soft and easy to digest (bland), such as toast. Gradually return to your regular diet.  Drink enough fluid to keep your urine pale yellow.  Return to your normal activities as told by your health care provider. Ask your health care provider what activities are safe for you. This information is not intended to replace advice given to you by your health care provider. Make sure you discuss any questions you have with your health care provider. Document Revised: 10/15/2017 Document Reviewed: 05/28/2017 Elsevier Patient Education  2020 [employer]  

## 2020-04-10 NOTE — H&P (Signed)
HISTORY AND PHYSICAL INTERVAL NOTE:  04/10/2020  2:15 PM  Stacy Chambers  has presented today for surgery, with the diagnosis of M20.11  [place].  The various methods of treatment have been discussed with the patient.  No guarantees were given.  After consideration of risks, benefits and other options for treatment, the patient has consented to surgery.  I have reviewed the patients' chart and labs.     A history and physical examination was performed in my office.  The patient was reexamined.  There have been no changes to this history and physical examination.  [name] A

## 2020-04-10 NOTE — Op Note (Signed)
Operative note   Surgeon:Julann Mcgilvray Lawyer: None    Preop diagnosis: Hallux valgus right foot    Postop diagnosis: Same    Procedure: [place] with Akin hallux valgus correction right foot    EBL: Minimal    Anesthesia:local and IV sedation.  Local consisted of a total of 20 cc of Exparel and 10 cc of 0.25% percent bupivacaine    Hemostasis: Mid calf tourniquet inflated to 200 mmHg for approximately 45 minutes    Specimen: None    Complications: None    Operative indications:Stacy Chambers is an 54 y.o. that presents today for surgical intervention.  The risks/benefits/alternatives/complications have been discussed and consent has been given.    Procedure:  Patient was brought into the OR and placed on the operating table in thesupine position. After anesthesia was obtained theright lower extremity was prepped and draped in usual sterile fashion.  Attention was directed to the right foot where a dorsal medial incision was placed overlying the first MTPJ.  Sharp and blunt dissection carried down to the capsule.  A T capsulotomy was performed.  The dorsomedial eminence was noted and transected.  The V osteotomy was then created and the capital fragment translocated laterally.  This was stabilized with a 0.062 K wire additionally stabilized with a 2.5 mm headless screw.  Good stability and alignment was noted.  The ensuing overhanging ledge was then transected with a power saw and smoothed with a power rasp.  Residual valgus of the great toe was noted.  Next a wedge osteotomy was created at the base of the proximal phalanx with the apex lateral.  The wedge was then removed and stabilized with a compression bone staple.  Good alignment of the great toe was then noted at this time.  The wound was flushed with copious amounts irrigation.  Layered closure was performed with a 3-0 Vicryl for the capsular layer, 4-0 Vicryl for the subcutaneous tissue and a 5-0 Monocryl undyed for the  skin.    Patient tolerated the procedure and anesthesia well.  Was transported from the OR to the PACU with all vital signs stable and vascular status intact. To be discharged per routine protocol.  Will follow up in approximately 1 week in the outpatient clinic.

## 2020-04-10 NOTE — Anesthesia Procedure Notes (Signed)
Procedure Name: LMA Insertion Date/Time: 04/10/2020 2:27 PM Performed by: Mayme Genta, CRNA Pre-anesthesia Checklist: Patient identified, Emergency Drugs available, Suction available, Timeout performed and Patient being monitored Patient Re-evaluated:Patient Re-evaluated prior to induction Oxygen Delivery Method: Circle system utilized Preoxygenation: Pre-oxygenation with 100% oxygen Induction Type: IV induction LMA: LMA inserted LMA Size: 4.0 Number of attempts: 1 Placement Confirmation: positive ETCO2 and breath sounds checked- equal and bilateral Tube secured with: Tape

## 2020-04-11 ENCOUNTER — Encounter: Payer: Self-pay | Admitting: Podiatry

## 2020-04-12 ENCOUNTER — Other Ambulatory Visit: Payer: Self-pay | Admitting: Family Medicine

## 2020-04-12 ENCOUNTER — Other Ambulatory Visit: Payer: Self-pay | Admitting: Psychiatry

## 2020-04-12 DIAGNOSIS — F3178 Bipolar disorder, in full remission, most recent episode mixed: Secondary | ICD-10-CM

## 2020-04-12 DIAGNOSIS — F411 Generalized anxiety disorder: Secondary | ICD-10-CM

## 2020-04-12 DIAGNOSIS — F3162 Bipolar disorder, current episode mixed, moderate: Secondary | ICD-10-CM

## 2020-04-16 DIAGNOSIS — M2011 Hallux valgus (acquired), right foot: Secondary | ICD-10-CM | POA: Diagnosis not present

## 2020-04-23 DIAGNOSIS — M79671 Pain in right foot: Secondary | ICD-10-CM | POA: Diagnosis not present

## 2020-04-23 DIAGNOSIS — M2011 Hallux valgus (acquired), right foot: Secondary | ICD-10-CM | POA: Diagnosis not present

## 2020-05-16 ENCOUNTER — Other Ambulatory Visit: Payer: Self-pay | Admitting: Psychiatry

## 2020-05-16 ENCOUNTER — Other Ambulatory Visit: Payer: Self-pay | Admitting: Family Medicine

## 2020-05-16 DIAGNOSIS — F3178 Bipolar disorder, in full remission, most recent episode mixed: Secondary | ICD-10-CM

## 2020-05-23 ENCOUNTER — Telehealth (INDEPENDENT_AMBULATORY_CARE_PROVIDER_SITE_OTHER): Payer: Medicare HMO | Admitting: Psychiatry

## 2020-05-23 ENCOUNTER — Other Ambulatory Visit: Payer: Self-pay

## 2020-05-23 ENCOUNTER — Encounter: Payer: Self-pay | Admitting: Psychiatry

## 2020-05-23 DIAGNOSIS — R413 Other amnesia: Secondary | ICD-10-CM

## 2020-05-23 DIAGNOSIS — F411 Generalized anxiety disorder: Secondary | ICD-10-CM

## 2020-05-23 DIAGNOSIS — F5105 Insomnia due to other mental disorder: Secondary | ICD-10-CM

## 2020-05-23 DIAGNOSIS — M2011 Hallux valgus (acquired), right foot: Secondary | ICD-10-CM | POA: Diagnosis not present

## 2020-05-23 DIAGNOSIS — F3161 Bipolar disorder, current episode mixed, mild: Secondary | ICD-10-CM | POA: Diagnosis not present

## 2020-05-23 MED ORDER — LAMOTRIGINE 25 MG PO TABS: 25.0000 mg | ORAL_TABLET | Freq: Every day | ORAL | 0 refills | Status: DC

## 2020-05-23 MED ORDER — ESCITALOPRAM OXALATE 5 MG PO TABS: 5.0000 mg | ORAL_TABLET | Freq: Every day | ORAL | 1 refills | Status: DC

## 2020-05-23 MED ORDER — BENZTROPINE MESYLATE 1 MG PO TABS: 0.5000 mg | ORAL_TABLET | Freq: Two times a day (BID) | ORAL | 1 refills | Status: DC | PRN

## 2020-05-23 NOTE — Progress Notes (Signed)
Provider Location : [facility] Patient Location : Home  Virtual Visit via Video Note  I connected with Stacy Chambers on 05/23/20 at 10:20 AM EDT by a video enabled telemedicine application and verified that I am speaking with the correct person using two identifiers.   I discussed the limitations of evaluation and management by telemedicine and the availability of in person appointments. The patient expressed understanding and agreed to proceed.  I discussed the assessment and treatment plan with the patient. The patient was provided an opportunity to ask questions and all were answered. The patient agreed with the plan and demonstrated an understanding of the instructions.   The patient was advised to call back or seek an in-person evaluation if the symptoms worsen or if the condition fails to improve as anticipated.   [facility] MD OP Progress Note  05/23/2020 8:15 PM [name]  MRN:  [id number]  Chief Complaint:  Chief Complaint    Follow-up     HPI: Stacy Chambers is a 54 year old Caucasian female, married, lives in [place], has a history of bipolar disorder, GAD, insomnia, acute neuroleptic induced akathisia, memory loss, RLS, COPD, chronic pain, abnormal liver function test was evaluated by telemedicine today.  Patient today reports that recently she went through an episode of feeling depressed as well as having sleep issues.  She reports she eventually felt better with regards to her sleep.  Since the past few nights she has been sleeping well.  She however continues to have sadness, lack of motivation and so on.  She reports she recently had fracture of of her right foot and required surgery.  She reports this does limit her ability to move around.  This in turn does affect her mood.  She however has been coping better.  Patient reports she is compliant on medications as prescribed.  Patient does report possible auditory hallucinations of hearing noises however reports she is able  to cope with it and it does not distress her much.  She is not interested in increasing her Geodon dosage or changing return to another antipsychotic.  She will let writer know if she has any worsening symptoms.  Patient denies any suicidality, homicidality or perceptual disturbances.  Patient denies any other concerns today.   Visit Diagnosis:    ICD-10-CM   1. Bipolar disorder, current episode mixed, mild (HCC)  F31.61 lamoTRIgine (LAMICTAL) 25 MG tablet  2. GAD (generalized anxiety disorder)  F41.1 escitalopram (LEXAPRO) 5 MG tablet  3. Insomnia due to mental disorder  F51.05 benztropine (COGENTIN) 1 MG tablet  4. Memory loss  R41.3     Past Psychiatric History: I have reviewed past psychiatric history from my progress note on 10/12/2018.  Past trials of BuSpar, Zoloft, carbamazepine Past Medical History:  Past Medical History:  Diagnosis Date  . Abnormal blood chemistry 10/03/2015  . Anginal pain ([place])   . Anxiety   . Anxiety, generalized 02/21/2015  . Bipolar affective disorder ([place]) 03/08/2013   Overview:  Last Assessment & Plan:  Continue meds, f/u with psych Restart benzo at 1 po BID prn,    . Bipolar disorder ([place])   . 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 Chronic obstructive pulmonary disease ([place]) 10/03/2015  . COPD (chronic obstructive pulmonary disease) (HCC)    no inhalers  . Elevated liver function tests 11/26/2004   H/O NEGATIVE LIVER BIOPSY, workup negative  . Gallstones 02/21/2015  . GERD (gastroesophageal reflux disease)   . Hyperlipidemia   . Insomnia   .  Lower back pain   . Osteoarthritis   . Ovarian cancer ([place]) 1995  . RLS (restless legs syndrome)   . Sedative, hypnotic or anxiolytic dependence with withdrawal, 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[place])   . Wears dentures    full upper and lower    Past Surgical History:  Procedure Laterality Date  . ABDOMINAL HYSTERECTOMY    . APPENDECTOMY    . BREAST BIOPSY Left 03/12/2015   fat necrosis  . CESAREAN SECTION    . COLONOSCOPY WITH  PROPOFOL N/A 05/30/2018   Procedure: COLONOSCOPY WITH PROPOFOL;  Surgeon: Manya Silvas, MD;  Location: [facility] ENDOSCOPY;  Service: Endoscopy;  Laterality: N/A;  . EXCISION MORTON'S NEUROMA Left 10/07/2016   Procedure: EXCISION MORTON'S NEUROMA;  Surgeon: [name], DPM;  Location: [facility];  Service: Podiatry;  Laterality: Left;  IV WITH LOCAL  . LIVER BIOPSY  2005   PerPt: Done to eval elevated LFTs and biopsy provided no definite dx/cause of elevated LFTs  . MULTIPLE TOOTH EXTRACTIONS    . OOPHORECTOMY    . OSTECTOMY Right 04/10/2020   Procedure: DOUBLE OSTEOTOMY GENERAL W/ LOCAL;  Surgeon: [name], DPM;  Location: [facility];  Service: Podiatry;  Laterality: Right;    Family Psychiatric History: I have reviewed family psychiatric history from my progress note on 10/12/2018  Family History:  Family History  Adopted: Yes  Problem Relation Age of Onset  . COPD Mother   . Depression Mother   . Colon cancer Mother        unsure of age  . Cancer Father        Lung CA, Skin Cancer--?type  . COPD Father   . Stroke Father   . Cancer Sister 61       cervical cancer  . COPD Brother   . Alcohol abuse Brother   . Stroke Brother   . COPD Maternal Aunt   . Depression Daughter   . Schizophrenia Son   . Breast cancer Neg Hx     Social History: I have reviewed social history from my progress note on 10/12/2018 Social History   Socioeconomic History  . Marital status: Married    Spouse name: Not on file  . Number of children: Not on file  . Years of education: Not on file  . Highest education level: Not on file  Occupational History  . Not on file  Tobacco Use  . Smoking status: Former Smoker    Packs/day: 1.00    Years: 30.00    Pack years: 30.00    Types: Cigarettes    Quit date: 04/08/2009    Years since quitting: 11.1  . Smokeless tobacco: Never Used  Vaping Use  . Vaping Use: Never used  Substance and Sexual Activity  . Alcohol use: Yes     Alcohol/week: 3.0 - 7.0 standard drinks    Types: 3 - 7 Shots of liquor per week    Comment: occasionally  . Drug use: Yes    Types: Marijuana    Comment: told to refrain until after surgery  . Sexual activity: Yes    Birth control/protection: Surgical  Other Topics Concern  . Not on file  Social History Narrative  . Not on file   Social Determinants of Health   Financial Resource Strain:   . Difficulty of Paying Living Expenses:   Food Insecurity:   . Worried About Charity fundraiser in the Last Year:   . [place] in the Last  Year:   Transportation Needs:   . Film/video editor (Medical):   Marland Kitchen Lack of Transportation (Non-Medical):   Physical Activity:   . Days of Exercise per Week:   . Minutes of Exercise per Session:   Stress:   . Feeling of Stress :   Social Connections:   . Frequency of Communication with Friends and Family:   . Frequency of Social Gatherings with Friends and Family:   . Attends Religious Services:   . Active Member of Clubs or Organizations:   . Attends Archivist Meetings:   Marland Kitchen Marital Status:     Allergies:  Allergies  Allergen Reactions  . Benadryl [Diphenhydramine Hcl]     Hives/ rapid heartrate  . Codeine     dizziness  . Vicodin [Hydrocodone-Acetaminophen] Other (See Comments)    dizziness    Metabolic Disorder Labs: Lab Results  Component Value Date   HGBA1C 5.6 09/14/2018   MPG 114 09/14/2018   No results found for: PROLACTIN Lab Results  Component Value Date   CHOL 152 12/19/2019   TRIG 190 (H) 12/19/2019   HDL 51 12/19/2019   CHOLHDL 3.0 12/19/2019   VLDL 18 03/04/2015   LDLCALC 73 12/19/2019   LDLCALC 77 02/01/2019   Lab Results  Component Value Date   TSH 1.51 12/19/2019   TSH 2.320 10/24/2018    Therapeutic Level Labs: Lab Results  Component Value Date   LITHIUM 0.3 (L) 10/24/2018   LITHIUM 0.49 (L) 01/13/2009   No results found for: VALPROATE No components found for:   CBMZ  Current Medications: Current Outpatient Medications  Medication Sig Dispense Refill  . ammonium lactate (AMLACTIN) 12 % cream APPLY TOPICALLY AS NEEDED  FOR  DRY  SKIN 385 g 0  . rosuvastatin (CRESTOR) 10 MG tablet TAKE 1 TABLET EVERY DAY 90 tablet 0  . alendronate (FOSAMAX) 70 MG tablet Take 1 tablet (70 mg total) by mouth once a week. Take with a full glass of water on an empty stomach. 12 tablet 3  . benztropine (COGENTIN) 1 MG tablet Take 0.5 tablets (0.5 mg total) by mouth 2 (two) times daily as needed for tremors. 90 tablet 1  . escitalopram (LEXAPRO) 5 MG tablet Take 1 tablet (5 mg total) by mouth daily. 90 tablet 1  . esomeprazole (NEXIUM) 20 MG capsule Take 1 capsule (20 mg total) by mouth daily as needed (take on empty stomach). 90 capsule 1  . fluticasone (FLONASE) 50 MCG/ACT nasal spray SPRAY 2 SPRAYS INTO EACH NOSTRIL EVERY DAY 16 mL 1  . Glucosamine-Chondroitin (GLUCOSAMINE CHONDR COMPLEX PO) Take 1 capsule by mouth in the morning and at bedtime.    . hydrOXYzine (ATARAX/VISTARIL) 50 MG tablet TAKE 1 TABLET (50 MG TOTAL) BY MOUTH TWO TIMES DAILY AS NEEDED FOR SEVERE ANXIETY ATTACKS 180 tablet 0  . lamoTRIgine (LAMICTAL) 100 MG tablet TAKE 1 TABLET EVERY DAY 90 tablet 1  . lamoTRIgine (LAMICTAL) 25 MG tablet Take 1 tablet (25 mg total) by mouth daily. Take in combination with 100 mg daily by mouth. 90 tablet 0  . meloxicam (MOBIC) 7.5 MG tablet TAKE 1 TABLET TWICE DAILY AS NEEDED FOR PAIN 180 tablet 0  . oxyCODONE-acetaminophen (PERCOCET) 5-325 MG tablet Take 1-2 tablets by mouth every 6 (six) hours as needed for severe pain. 20 tablet 0  . traZODone (DESYREL) 100 MG tablet Take 1 tablet (100 mg total) by mouth at bedtime. 90 tablet 1  . ziprasidone (GEODON) 80 MG capsule TAKE 1  CAPSULE (80 MG TOTAL) BY MOUTH DAILY WITH SUPPER. 90 capsule 1   No current facility-administered medications for this visit.     Musculoskeletal: Strength & Muscle Tone: UTA Gait & Station:  normal Patient leans: N/A  Psychiatric Specialty Exam: Review of Systems  Musculoskeletal:       Rt.Foot swelling and pain - s/p surgery improving  Psychiatric/Behavioral: Positive for dysphoric mood and hallucinations.  All other systems reviewed and are negative.   There were no vitals taken for this visit.There is no height or weight on file to calculate BMI.  General Appearance: Casual  Eye Contact:  Fair  Speech:  Clear and Coherent  Volume:  Normal  Mood:  Dysphoric  Affect:  Congruent  Thought Process:  Goal Directed and Descriptions of Associations: Intact  Orientation:  Full (Time, Place, and Person)  Thought Content: Hallucinations: Auditory   Suicidal Thoughts:  No  Homicidal Thoughts:  No  Memory:  Immediate;   Fair Recent;   Fair Remote;   Fair  Judgement:  Fair  Insight:  Fair  Psychomotor Activity:  Normal  Concentration:  Concentration: Fair and Attention Span: Fair  Recall:  [employer] of Knowledge: Fair  Language: Fair  Akathisia:  No  Handed:  Right  AIMS (if indicated): UTA  Assets:  Communication Skills Desire for Improvement Housing Social Support  ADL's:  Intact  Cognition: WNL  Sleep:  Fair   Screenings: AIMS     Office Visit from 10/24/2018 in [facility] Office Visit from 04/04/2018 in [facility] Office Visit from 01/03/2018 in [facility] Office Visit from 11/18/2016 in [facility] Total Score 5 0 0 0    PHQ2-9     Office Visit from 02/07/2020 in [facility] Office Visit from 12/19/2019 in [facility] Office Visit from 10/21/2018 in [facility] Office Visit from 09/29/2018 in [facility] Office Visit from 09/14/2018 in [facility]  PHQ-2 Total Score 0 0 4 4 5   PHQ-9 Total Score -- -- 21 21 19        Assessment and Plan: KANIJAH GROSECLOSE  is a 54 year old Caucasian female, married, lives in [place], has a history of bipolar disorder, anxiety disorder, COPD, RLS, elevated liver enzymes, recent right foot fracture status post surgery was evaluated by telemedicine today.  Patient with psychosocial stressors of the death of her son who passed away 11 years ago, her own health problems, the current pandemic, relationship struggles.  Patient is currently struggling with depressive symptoms and will benefit from medication readjustment.  Plan Bipolar disorder-mild-mixed-unstable Continue Geodon 80 mg p.o. daily with supper.  She did not tolerate higher dosage.  She is not interested in changing Geodon to another antipsychotic. Continue Cogentin 0.5 mg p.o. 3 times daily as needed for severe abnormal movements secondary to Geodon.  Advised patient to limit use. Increase lamotrigine to 125 mg p.o. daily  GAD-stable Lexapro 5 mg p.o. daily Hydroxyzine 50 mg p.o. twice daily as needed for severe anxiety symptoms  Insomnia-improving Melatonin 3 to 5 mg p.o. nightly  Memory loss-unspecified-we will monitor closely. She was referred to neurology however she has been noncompliant.  Patient offered CBT referral, she declined.  Follow-up in clinic in 4 to 6 weeks or sooner if needed.  I have spent atleast 20 minutes non face to face with patient today. More than 50 % of the time was  spent for preparing to see the patient ( e.g., review of test, records ), obtaining and to review and separately obtained history , ordering medications and test ,psychoeducation and supportive psychotherapy and care coordination,as well as documenting clinical information in electronic health record. This note was generated in part or whole with voice recognition software. Voice recognition is usually quite accurate but there are transcription errors that can and very often do occur. I apologize for any typographical errors that were not detected and  corrected.       Ursula Alert, MD 05/23/2020, 8:15 PM

## 2020-06-08 ENCOUNTER — Other Ambulatory Visit: Payer: Self-pay | Admitting: Family Medicine

## 2020-06-18 ENCOUNTER — Encounter: Payer: Self-pay | Admitting: Family Medicine

## 2020-06-18 ENCOUNTER — Ambulatory Visit (INDEPENDENT_AMBULATORY_CARE_PROVIDER_SITE_OTHER): Payer: Medicare HMO | Admitting: Family Medicine

## 2020-06-18 ENCOUNTER — Other Ambulatory Visit: Payer: Self-pay

## 2020-06-18 VITALS — BP 124/78 | HR 84 | Temp 98.4°F | Resp 14 | Ht 64.0 in | Wt 184.0 lb

## 2020-06-18 DIAGNOSIS — M810 Age-related osteoporosis without current pathological fracture: Secondary | ICD-10-CM | POA: Diagnosis not present

## 2020-06-18 DIAGNOSIS — E7800 Pure hypercholesterolemia, unspecified: Secondary | ICD-10-CM

## 2020-06-18 DIAGNOSIS — J439 Emphysema, unspecified: Secondary | ICD-10-CM

## 2020-06-18 DIAGNOSIS — R202 Paresthesia of skin: Secondary | ICD-10-CM

## 2020-06-18 MED ORDER — FLUTICASONE PROPIONATE 50 MCG/ACT NA SUSP: 2.0000 | Freq: Every day | NASAL | 2 refills | Status: DC

## 2020-06-18 MED ORDER — BUDESONIDE-FORMOTEROL FUMARATE 160-4.5 MCG/ACT IN AERO: 2.0000 | INHALATION_SPRAY | Freq: Two times a day (BID) | RESPIRATORY_TRACT | 6 refills | Status: DC

## 2020-06-18 NOTE — Patient Instructions (Signed)
F/U 6 months for physical  Try the wrist splint  We will call with lab results

## 2020-06-18 NOTE — Progress Notes (Signed)
   Subjective:    Patient ID: Stacy Chambers, female    DOB: April 25, 1966, 54 y.o.   MRN: [id number]  Patient presents for Follow-up (is fasting) Patient here follow-up chronic medical problems.  Medications reviewed.  She continues to follow with her psychiatrist  Hyperlipidemia she is on Crestor 10 mg once a day  COPD she is using Symbicort for prevention she has not had any exacerbations, but her medication has expired, she doesn't use albuterol, was told to stop by pulmonary per report   she has nexium on hand for reflux symptoms but rarely uses     Osteoporosis she is currently on Fosamax weekly Triglycerides are elevated at 190  She has had mild tingling in right hand for months   no neck pain, no pain in hand, no injury   Not dropping any items     Review Of Systems:  GEN- denies fatigue, fever, weight loss,weakness, recent illness HEENT- denies eye drainage, change in vision, nasal discharge, CVS- denies chest pain, palpitations RESP- denies SOB, cough, wheeze ABD- denies N/V, change in stools, abd pain GU- denies dysuria, hematuria, dribbling, incontinence MSK- denies joint pain, muscle aches, injury Neuro- denies headache, dizziness, syncope, seizure activity       Objective:    BP 124/78   Pulse 84   Temp 98.4 F (36.9 C) (Temporal)   Resp 14   Ht 5\' 4"  (1.626 m)   Wt 184 lb (83.5 kg)   SpO2 96%   BMI 31.58 kg/m  GEN- NAD, alert and oriented x3 HEENT- PERRL, EOMI, non injected sclera, pink conjunctiva, MMM, oropharynx clear Neck- Supple, no thyromegaly, good ROM, Non-tender  CVS- RRR, no murmur RESP-CTAB ABD-NABS,soft,NT,ND NEURO-CNII-XII in tact, normal tone UE, sensation grossly in tact, normal grasp bilat, normal monofilament UE  EXT- No edema Pulses- Radial 2+        Assessment & Plan:      Problem List Items Addressed This Visit      Unprioritized   COPD (chronic obstructive pulmonary disease) (HCC) - Primary    Continue symbicort,  refilled today       Relevant Medications   budesonide-formoterol (SYMBICORT) 160-4.5 MCG/ACT inhaler   fluticasone (FLONASE) 50 MCG/ACT nasal spray   HLD (hyperlipidemia)    Check lipids and LFT Continue crestor   Mild paresthesias in her hand.  She does not have any neurological deficit noted on exam.  Will check B12 and magnesium with some of her chronic medications.  Also discussed using her wrist brace due to possible underlying carpal tunnel syndrome.  She was given a prescription for this.  We discussed the next steps would be nerve conduction study but she would like to hold off on this for now.      Relevant Orders   CBC with Differential/Platelet   Comprehensive metabolic panel   Lipid panel   Osteoporosis    Continue fosmax Check vitamin D level Repeat bone density in 2022      Relevant Orders   Vitamin D, 25-hydroxy    Other Visit Diagnoses    Paresthesias in right hand       Relevant Orders   Comprehensive metabolic panel   Vitamin P38   Magnesium      Note: This dictation was prepared with Dragon dictation along with smaller phrase technology. Any transcriptional errors that result from this process are unintentional.

## 2020-06-18 NOTE — Assessment & Plan Note (Signed)
Check lipids and LFT Continue crestor   Mild paresthesias in her hand.  She does not have any neurological deficit noted on exam.  Will check B12 and magnesium with some of her chronic medications.  Also discussed using her wrist brace due to possible underlying carpal tunnel syndrome.  She was given a prescription for this.  We discussed the next steps would be nerve conduction study but she would like to hold off on this for now.

## 2020-06-18 NOTE — Assessment & Plan Note (Addendum)
Continue fosmax Check vitamin D level Repeat bone density in 2022

## 2020-06-18 NOTE — Assessment & Plan Note (Signed)
Continue symbicort, refilled today

## 2020-06-19 LAB — CBC WITH DIFFERENTIAL/PLATELET
Absolute Monocytes: 383 cells/uL (ref 200–950)
Basophils Absolute: 53 cells/uL (ref 0–200)
Basophils Relative: 0.7 %
Eosinophils Absolute: 90 cells/uL (ref 15–500)
Eosinophils Relative: 1.2 %
HCT: 38.7 % (ref 35.0–45.0)
Hemoglobin: 12.7 g/dL (ref 11.7–15.5)
Lymphs Abs: 2168 cells/uL (ref 850–3900)
MCH: 29.5 pg (ref 27.0–33.0)
MCHC: 32.8 g/dL (ref 32.0–36.0)
MCV: 89.8 fL (ref 80.0–100.0)
MPV: 9.6 fL (ref 7.5–12.5)
Monocytes Relative: 5.1 %
Neutro Abs: 4808 cells/uL (ref 1500–7800)
Neutrophils Relative %: 64.1 %
Platelets: 201 10*3/uL (ref 140–400)
RBC: 4.31 10*6/uL (ref 3.80–5.10)
RDW: 12.8 % (ref 11.0–15.0)
Total Lymphocyte: 28.9 %
WBC: 7.5 10*3/uL (ref 3.8–10.8)

## 2020-06-19 LAB — COMPREHENSIVE METABOLIC PANEL
AG Ratio: 2.1 (calc) (ref 1.0–2.5)
ALT: 32 U/L — ABNORMAL HIGH (ref 6–29)
AST: 26 U/L (ref 10–35)
Albumin: 4.5 g/dL (ref 3.6–5.1)
Alkaline phosphatase (APISO): 124 U/L (ref 37–153)
BUN: 10 mg/dL (ref 7–25)
CO2: 27 mmol/L (ref 20–32)
Calcium: 9.4 mg/dL (ref 8.6–10.4)
Chloride: 103 mmol/L (ref 98–110)
Creat: 0.78 mg/dL (ref 0.50–1.05)
Globulin: 2.1 g/dL (calc) (ref 1.9–3.7)
Glucose, Bld: 98 mg/dL (ref 65–99)
Potassium: 4.1 mmol/L (ref 3.5–5.3)
Sodium: 140 mmol/L (ref 135–146)
Total Bilirubin: 0.4 mg/dL (ref 0.2–1.2)
Total Protein: 6.6 g/dL (ref 6.1–8.1)

## 2020-06-19 LAB — VITAMIN D 25 HYDROXY (VIT D DEFICIENCY, FRACTURES): Vit D, 25-Hydroxy: 26 ng/mL — ABNORMAL LOW (ref 30–100)

## 2020-06-19 LAB — LIPID PANEL
Cholesterol: 167 mg/dL (ref <200)
HDL: 54 mg/dL (ref >=50)
LDL Cholesterol (Calc): 87 mg/dL (calc)
Non-HDL Cholesterol (Calc): 113 mg/dL (calc) (ref <130)
Total CHOL/HDL Ratio: 3.1 (calc) (ref <5.0)
Triglycerides: 165 mg/dL — ABNORMAL HIGH (ref <150)

## 2020-06-19 LAB — VITAMIN B12: Vitamin B-12: 330 pg/mL (ref 200–1100)

## 2020-06-19 LAB — MAGNESIUM: Magnesium: 2.1 mg/dL (ref 1.5–2.5)

## 2020-06-26 ENCOUNTER — Other Ambulatory Visit: Payer: Self-pay | Admitting: Psychiatry

## 2020-06-26 DIAGNOSIS — F411 Generalized anxiety disorder: Secondary | ICD-10-CM

## 2020-07-02 ENCOUNTER — Other Ambulatory Visit: Payer: Self-pay | Admitting: Family Medicine

## 2020-07-12 ENCOUNTER — Other Ambulatory Visit: Payer: Self-pay

## 2020-07-12 ENCOUNTER — Encounter: Payer: Self-pay | Admitting: Psychiatry

## 2020-07-12 ENCOUNTER — Telehealth (INDEPENDENT_AMBULATORY_CARE_PROVIDER_SITE_OTHER): Payer: Medicare HMO | Admitting: Psychiatry

## 2020-07-12 DIAGNOSIS — R413 Other amnesia: Secondary | ICD-10-CM

## 2020-07-12 DIAGNOSIS — F3178 Bipolar disorder, in full remission, most recent episode mixed: Secondary | ICD-10-CM

## 2020-07-12 DIAGNOSIS — F411 Generalized anxiety disorder: Secondary | ICD-10-CM

## 2020-07-12 DIAGNOSIS — F5105 Insomnia due to other mental disorder: Secondary | ICD-10-CM | POA: Diagnosis not present

## 2020-07-12 DIAGNOSIS — F3161 Bipolar disorder, current episode mixed, mild: Secondary | ICD-10-CM | POA: Insufficient documentation

## 2020-07-12 MED ORDER — ZIPRASIDONE HCL 60 MG PO CAPS: 60.0000 mg | ORAL_CAPSULE | Freq: Every day | ORAL | 1 refills | Status: DC

## 2020-07-12 NOTE — Progress Notes (Signed)
Provider Location : [facility] Patient Location : [place]  Participants: Patient , Provider  Virtual Visit via Video Note  I connected with [name] on 07/12/20 at 10:20 AM EDT by a video enabled telemedicine application and verified that I am speaking with the correct person using two identifiers.   I discussed the limitations of evaluation and management by telemedicine and the availability of in person appointments. The patient expressed understanding and agreed to proceed.    I discussed the assessment and treatment plan with the patient. The patient was provided an opportunity to ask questions and all were answered. The patient agreed with the plan and demonstrated an understanding of the instructions.   The patient was advised to call back or seek an in-person evaluation if the symptoms worsen or if the condition fails to improve as anticipated.  [facility] MD OP Progress Note  07/12/2020 12:33 PM JAYAH BALTHAZAR  MRN:  [id number]  Chief Complaint:  Chief Complaint    Follow-up     HPI: ESLI CLEMENTS is a 54 year old Caucasian female, lives in [place], married, has a history of bipolar disorder, GAD, insomnia, acute neuroleptic induced akathisia, memory loss, RLS, COPD, chronic pain, abnormal liver function test was evaluated by telemedicine today.  Patient today reports she is currently taking a lower dosage of Geodon.  She reduced her Geodon to 60 mg 2 weeks ago.  She reports she started feeling much better and decided to try it.  She reports so far she is doing well.  She wants to continue the same dosage at this time.  She denies any side effects.  She is tolerating the higher dosage of Lamictal well.  She denies any suicidality, homicidality or perceptual disturbances.  She reports sleep as good.  She reports she has been keeping herself busy spending time with family, friends and doing yard work and other chores around the house.  That does help her  emotionally.  She denies any other concerns today.  Visit Diagnosis:    ICD-10-CM   1. Bipolar 1 disorder, mixed, full remission (HCC)  F31.78 ziprasidone (GEODON) 60 MG capsule  2. GAD (generalized anxiety disorder)  F41.1   3. Insomnia due to mental disorder  F51.05   4. Memory loss  R41.3     Past Psychiatric History: I have reviewed past psychiatric history from my progress note on 10/12/2018.  Past trials of BuSpar, Zoloft, carbamazepine.  Past Medical History:  Past Medical History:  Diagnosis Date  . Abnormal blood chemistry 10/03/2015  . Anginal pain ([place])   . Anxiety   . Anxiety, generalized 02/21/2015  . Bipolar affective disorder ([place]) 03/08/2013   Overview:  Last Assessment & Plan:  Continue meds, f/u with psych Restart benzo at 1 po BID prn,    . Bipolar disorder ([place])   . 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 Chronic obstructive pulmonary disease ([place]) 10/03/2015  . COPD (chronic obstructive pulmonary disease) (HCC)    no inhalers  . Elevated liver function tests 11/26/2004   H/O NEGATIVE LIVER BIOPSY, workup negative  . Gallstones 02/21/2015  . GERD (gastroesophageal reflux disease)   . Hyperlipidemia   . Insomnia   . Lower back pain   . Osteoarthritis   . Ovarian cancer ([place]) 1995  . RLS (restless legs syndrome)   . Sedative, hypnotic or anxiolytic dependence with withdrawal, 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[place])   . Wears dentures    full upper and lower    Past Surgical History:  Procedure Laterality Date  . ABDOMINAL HYSTERECTOMY    . APPENDECTOMY    . BREAST BIOPSY Left 03/12/2015   fat necrosis  . CESAREAN SECTION    . COLONOSCOPY WITH PROPOFOL N/A 05/30/2018   Procedure: COLONOSCOPY WITH PROPOFOL;  Surgeon: Manya Silvas, MD;  Location[facility] ENDOSCOPY;  Service: Endoscopy;  Laterality: N/A;  . EXCISION MORTON'S NEUROMA Left 10/07/2016   Procedure: EXCISION MORTON'S NEUROMA;  Surgeon: [name], DPM;  Location: [place];  Service: Podiatry;  Laterality: Left;   IV WITH LOCAL  . LIVER BIOPSY  2005   PerPt: Done to eval elevated LFTs and biopsy provided no definite dx/cause of elevated LFTs  . MULTIPLE TOOTH EXTRACTIONS    . OOPHORECTOMY    . OSTECTOMY Right 04/10/2020   Procedure: DOUBLE OSTEOTOMY GENERAL W/ LOCAL;  Surgeon: [name], DPM;  Location: [facility];  Service: Podiatry;  Laterality: Right;    Family Psychiatric History: I have reviewed family psychiatric history from my progress note on 10/12/2018.  Family History:  Family History  Adopted: Yes  Problem Relation Age of Onset  . COPD Mother   . Depression Mother   . Colon cancer Mother        unsure of age  . Cancer Father        Lung CA, Skin Cancer--?type  . COPD Father   . Stroke Father   . Cancer Sister 70       cervical cancer  . COPD Brother   . Alcohol abuse Brother   . Stroke Brother   . COPD Maternal Aunt   . Depression Daughter   . Schizophrenia Son   . Breast cancer Neg Hx     Social History: I have reviewed social history from my progress note on 10/12/2018. Social History   Socioeconomic History  . Marital status: Married    Spouse name: Not on file  . Number of children: Not on file  . Years of education: Not on file  . Highest education level: Not on file  Occupational History  . Not on file  Tobacco Use  . Smoking status: Former Smoker    Packs/day: 1.00    Years: 30.00    Pack years: 30.00    Types: Cigarettes    Quit date: 04/08/2009    Years since quitting: 11.2  . Smokeless tobacco: Never Used  Vaping Use  . Vaping Use: Never used  Substance and Sexual Activity  . Alcohol use: Yes    Alcohol/week: 3.0 - 7.0 standard drinks    Types: 3 - 7 Shots of liquor per week    Comment: occasionally  . Drug use: Yes    Types: Marijuana    Comment: told to refrain until after surgery  . Sexual activity: Yes    Birth control/protection: Surgical  Other Topics Concern  . Not on file  Social History Narrative  . Not on file    Social Determinants of Health   Financial Resource Strain:   . Difficulty of Paying Living Expenses: Not on file  Food Insecurity:   . Worried About Charity fundraiser in the Last Year: Not on file  . Ran Out of Food in the Last Year: Not on file  Transportation Needs:   . Lack of Transportation (Medical): Not on file  . Lack of Transportation (Non-Medical): Not on file  Physical Activity:   . Days of Exercise per Week: Not on file  . Minutes of Exercise per Session:  Not on file  Stress:   . Feeling of Stress : Not on file  Social Connections:   . Frequency of Communication with Friends and Family: Not on file  . Frequency of Social Gatherings with Friends and Family: Not on file  . Attends Religious Services: Not on file  . Active Member of Clubs or Organizations: Not on file  . Attends Archivist Meetings: Not on file  . Marital Status: Not on file    Allergies:  Allergies  Allergen Reactions  . Benadryl [Diphenhydramine Hcl]     Hives/ rapid heartrate  . Codeine     dizziness  . Vicodin [Hydrocodone-Acetaminophen] Other (See Comments)    dizziness    Metabolic Disorder Labs: Lab Results  Component Value Date   HGBA1C 5.6 09/14/2018   MPG 114 09/14/2018   No results found for: PROLACTIN Lab Results  Component Value Date   CHOL 167 06/18/2020   TRIG 165 (H) 06/18/2020   HDL 54 06/18/2020   CHOLHDL 3.1 06/18/2020   VLDL 18 03/04/2015   LDLCALC 87 06/18/2020   LDLCALC 73 12/19/2019   Lab Results  Component Value Date   TSH 1.51 12/19/2019   TSH 2.320 10/24/2018    Therapeutic Level Labs: Lab Results  Component Value Date   LITHIUM 0.3 (L) 10/24/2018   LITHIUM 0.49 (L) 01/13/2009   No results found for: VALPROATE No components found for:  CBMZ  Current Medications: Current Outpatient Medications  Medication Sig Dispense Refill  . alendronate (FOSAMAX) 70 MG tablet Take 1 tablet (70 mg total) by mouth once a week. Take with a full  glass of water on an empty stomach. 12 tablet 3  . ammonium lactate (AMLACTIN) 12 % cream APPLY TOPICALLY AS NEEDED  FOR  DRY  SKIN 385 g 0  . benztropine (COGENTIN) 1 MG tablet Take 0.5 tablets (0.5 mg total) by mouth 2 (two) times daily as needed for tremors. 90 tablet 1  . budesonide-formoterol (SYMBICORT) 160-4.5 MCG/ACT inhaler Inhale 2 puffs into the lungs 2 (two) times daily. 1 each 6  . escitalopram (LEXAPRO) 5 MG tablet Take 1 tablet (5 mg total) by mouth daily. 90 tablet 1  . esomeprazole (NEXIUM) 20 MG capsule Take 1 capsule (20 mg total) by mouth daily as needed (take on empty stomach). 90 capsule 1  . fluticasone (FLONASE) 50 MCG/ACT nasal spray Place 2 sprays into both nostrils daily. 16 mL 2  . hydrOXYzine (ATARAX/VISTARIL) 50 MG tablet TAKE 1 TABLET TWICE DAILY AS NEEDED FOR SEVERE ANXIETY ATTACKS 180 tablet 0  . lamoTRIgine (LAMICTAL) 100 MG tablet TAKE 1 TABLET EVERY DAY 90 tablet 1  . lamoTRIgine (LAMICTAL) 25 MG tablet Take 1 tablet (25 mg total) by mouth daily. Take in combination with 100 mg daily by mouth. 90 tablet 0  . meloxicam (MOBIC) 7.5 MG tablet TAKE 1 TABLET TWICE DAILY AS NEEDED FOR PAIN 180 tablet 0  . rosuvastatin (CRESTOR) 10 MG tablet TAKE 1 TABLET EVERY DAY 90 tablet 0  . traZODone (DESYREL) 100 MG tablet Take 1 tablet (100 mg total) by mouth at bedtime. 90 tablet 1  . ziprasidone (GEODON) 60 MG capsule Take 1 capsule (60 mg total) by mouth daily with supper. 90 capsule 1   No current facility-administered medications for this visit.     Musculoskeletal: Strength & Muscle Tone: UTA Gait & Station: normal Patient leans: N/A  Psychiatric Specialty Exam: Review of Systems  Psychiatric/Behavioral: Negative for agitation, behavioral problems, confusion, decreased  concentration, dysphoric mood, hallucinations, self-injury, sleep disturbance and suicidal ideas. The patient is not nervous/anxious and is not hyperactive.   All other systems reviewed and are  negative.   There were no vitals taken for this visit.There is no height or weight on file to calculate BMI.  General Appearance: Casual  Eye Contact:  Fair  Speech:  Normal Rate  Volume:  Normal  Mood:  Euthymic  Affect:  Congruent  Thought Process:  Goal Directed and Descriptions of Associations: Intact  Orientation:  Full (Time, Place, and Person)  Thought Content: Logical   Suicidal Thoughts:  No  Homicidal Thoughts:  No  Memory:  Immediate;   Fair Recent;   Fair Remote;   Fair  Judgement:  Fair  Insight:  Fair  Psychomotor Activity:  Normal  Concentration:  Concentration: Fair and Attention Span: Fair  Recall:  [employer] of Knowledge: Fair  Language: Fair  Akathisia:  No  Handed:  Right  AIMS (if indicated): UTA  Assets:  Communication Skills Desire for Improvement Housing Social Support  ADL's:  Intact  Cognition: WNL  Sleep:  Fair   Screenings: AIMS     Office Visit from 10/24/2018 in [facility] Office Visit from 04/04/2018 in [facility] Office Visit from 01/03/2018 in [facility] Office Visit from 11/18/2016 in [facility] Total Score 5 0 0 0    PHQ2-9     Office Visit from 02/07/2020 in [facility] Office Visit from 12/19/2019 in [facility] Office Visit from 10/21/2018 in [facility] Office Visit from 09/29/2018 in [facility] Office Visit from 09/14/2018 in [facility]  PHQ-2 Total Score 0 0 4 4 5   PHQ-9 Total Score -- -- 21 21 19        Assessment and Plan: ELANTRA CAPRARA is a 54 year old Caucasian female, married, lives in [place], has a history of bipolar disorder, anxiety disorder, COPD, RLS, elevated liver enzymes, was evaluated by telemedicine today.  Patient with psychosocial stressors of the death of her son who passed away 11 years ago, current  pandemic, relationship struggles.  Patient however is currently making progress and is on a lower dose to Geodon.  Plan as noted below.  Plan Bipolar disorder mixed in remission Reduce Geodon to 60 mg p.o. daily.  Patient has been on this dosage, reduced it herself and has been doing well. Cogentin 0.5 mg p.o. 3 times daily as needed for side effects of Geodon. Lamictal 125 mg p.o. daily  GAD-stable Lexapro 5 mg p.o. daily Hydroxyzine 50 mg p.o. twice daily as needed for severe anxiety symptoms.  Insomnia-improving Melatonin 3 to 5 mg p.o. nightly   Memory loss-unspecified-we will monitor closely. Patient was referred to neurology however has been noncompliant.  Follow-up in clinic in 2 to 3 months or sooner if needed.  I have spent atleast 20 minutes face to face with patient today. More than 50 % of the time was spent for preparing to see the patient ( e.g., review of test, records ), ordering medications and test ,psychoeducation and supportive psychotherapy and care coordination,as well as documenting clinical information in electronic health record. This note was generated in part or whole with voice recognition software. Voice recognition is usually quite accurate but there are transcription errors that can and very often do occur. I apologize for any typographical errors that were not detected and corrected.  Ursula Alert, MD 07/12/2020, 12:33 PM

## 2020-07-25 ENCOUNTER — Other Ambulatory Visit: Payer: Self-pay | Admitting: Family Medicine

## 2020-07-26 ENCOUNTER — Other Ambulatory Visit: Payer: Self-pay | Admitting: Family Medicine

## 2020-07-29 ENCOUNTER — Other Ambulatory Visit: Payer: Self-pay | Admitting: Psychiatry

## 2020-07-29 DIAGNOSIS — F3178 Bipolar disorder, in full remission, most recent episode mixed: Secondary | ICD-10-CM

## 2020-08-04 ENCOUNTER — Other Ambulatory Visit: Payer: Self-pay | Admitting: Psychiatry

## 2020-08-04 DIAGNOSIS — F3161 Bipolar disorder, current episode mixed, mild: Secondary | ICD-10-CM

## 2020-08-16 ENCOUNTER — Other Ambulatory Visit: Payer: Self-pay | Admitting: Family Medicine

## 2020-08-16 MED ORDER — BUDESONIDE-FORMOTEROL FUMARATE 160-4.5 MCG/ACT IN AERO: 2.0000 | INHALATION_SPRAY | Freq: Two times a day (BID) | RESPIRATORY_TRACT | 6 refills | Status: DC

## 2020-08-16 NOTE — Telephone Encounter (Signed)
Medication refilled

## 2020-08-16 NOTE — Telephone Encounter (Signed)
Patient left vm requesting budesonide-formoterol (SYMBICORT) 160-4.5 MCG/ACT inhaler Sig: Inhale 2 puffs into the lungs  2 times a day She uses [facility]  CB# [phone]

## 2020-08-17 ENCOUNTER — Other Ambulatory Visit: Payer: Self-pay | Admitting: Family Medicine

## 2020-08-26 ENCOUNTER — Other Ambulatory Visit: Payer: Self-pay | Admitting: Psychiatry

## 2020-08-26 DIAGNOSIS — F411 Generalized anxiety disorder: Secondary | ICD-10-CM

## 2020-08-29 ENCOUNTER — Other Ambulatory Visit: Payer: Self-pay | Admitting: Family Medicine

## 2020-08-30 ENCOUNTER — Telehealth: Payer: Self-pay

## 2020-08-30 DIAGNOSIS — F411 Generalized anxiety disorder: Secondary | ICD-10-CM

## 2020-08-30 DIAGNOSIS — F3178 Bipolar disorder, in full remission, most recent episode mixed: Secondary | ICD-10-CM

## 2020-08-30 MED ORDER — ZIPRASIDONE HCL 80 MG PO CAPS: 80.0000 mg | ORAL_CAPSULE | Freq: Every day | ORAL | 0 refills | Status: DC

## 2020-08-30 MED ORDER — ESCITALOPRAM OXALATE 10 MG PO TABS: 10.0000 mg | ORAL_TABLET | Freq: Every day | ORAL | 0 refills | Status: DC

## 2020-08-30 NOTE — Telephone Encounter (Signed)
pt called states that she needs something for her anxiety she states that the current medication is not working
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
## 2020-09-08 ENCOUNTER — Other Ambulatory Visit: Payer: Self-pay | Admitting: Psychiatry

## 2020-09-08 DIAGNOSIS — F411 Generalized anxiety disorder: Secondary | ICD-10-CM

## 2020-09-09 ENCOUNTER — Other Ambulatory Visit: Payer: Self-pay | Admitting: Family Medicine

## 2020-09-13 ENCOUNTER — Other Ambulatory Visit: Payer: Self-pay | Admitting: Family Medicine

## 2020-09-24 ENCOUNTER — Telehealth (INDEPENDENT_AMBULATORY_CARE_PROVIDER_SITE_OTHER): Payer: Medicare HMO | Admitting: Psychiatry

## 2020-09-24 ENCOUNTER — Encounter: Payer: Self-pay | Admitting: Psychiatry

## 2020-09-24 ENCOUNTER — Other Ambulatory Visit: Payer: Self-pay

## 2020-09-24 DIAGNOSIS — F5105 Insomnia due to other mental disorder: Secondary | ICD-10-CM

## 2020-09-24 DIAGNOSIS — F3161 Bipolar disorder, current episode mixed, mild: Secondary | ICD-10-CM

## 2020-09-24 DIAGNOSIS — F411 Generalized anxiety disorder: Secondary | ICD-10-CM | POA: Diagnosis not present

## 2020-09-24 MED ORDER — LAMOTRIGINE 150 MG PO TABS: 150.0000 mg | ORAL_TABLET | Freq: Every day | ORAL | 0 refills | Status: DC

## 2020-09-24 NOTE — Progress Notes (Signed)
Virtual Visit via Video Note  I connected with Stacy Chambers on 09/24/20 at 10:00 AM EST by a video enabled telemedicine application and verified that I am speaking with the correct person using two identifiers.  Location Provider Location : [facility] Patient Location : Car  Participants: Patient , Friend, Provider   I discussed the limitations of evaluation and management by telemedicine and the availability of in person appointments. The patient expressed understanding and agreed to proceed.    I discussed the assessment and treatment plan with the patient. The patient was provided an opportunity to ask questions and all were answered. The patient agreed with the plan and demonstrated an understanding of the instructions.   The patient was advised to call back or seek an in-person evaluation if the symptoms worsen or if the condition fails to improve as anticipated.   [place] MD OP Progress Note  09/24/2020 10:25 AM FORESTINE MACHO  MRN:  [id number]  Chief Complaint:  Chief Complaint    Follow-up     HPI: Stacy Chambers is a 54 year old Caucasian female, lives in [place], has a history of bipolar disorder, GAD, insomnia, acute neuroleptic induced akathisia, memory loss, RLS, COPD, chronic pain, abnormal liver function test, is married, was evaluated by telemedicine today.  Patient today reports she continues to have hypomanic episodes when she has racing thoughts, feels euphoric, has been shopping excessively.  She reports she has no control over her shopping which has been going on since the past few days.  She is interested in increasing the dosage of her medications if possible.  Going up on the Geodon did definitely help her.  She denies side effects.  Patient reports sleep is good.  She denies any suicidality, homicidality or perceptual disturbances.  Patient reports she had a good Thanksgiving holiday with family and friends.  Patient denies any other concerns  today.  Visit Diagnosis:    ICD-10-CM   1. Bipolar 1 disorder, mixed, mild (HCC)  F31.61 lamoTRIgine (LAMICTAL) 150 MG tablet  2. GAD (generalized anxiety disorder)  F41.1   3. Insomnia due to mental disorder  F51.05     Past Psychiatric History: I have reviewed past psychiatric history from my progress note on 10/12/2018.  Past trials of BuSpar, Zoloft, carbamazepine  Past Medical History:  Past Medical History:  Diagnosis Date  . Abnormal blood chemistry 10/03/2015  . Anginal pain ([place])   . Anxiety   . Anxiety, generalized 02/21/2015  . Bipolar affective disorder ([place]) 03/08/2013   Overview:  Last Assessment & Plan:  Continue meds, f/u with psych Restart benzo at 1 po BID prn,    . Bipolar disorder ([place])   . 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 Chronic obstructive pulmonary disease ([place]) 10/03/2015  . COPD (chronic obstructive pulmonary disease) (HCC)    no inhalers  . Elevated liver function tests 11/26/2004   H/O NEGATIVE LIVER BIOPSY, workup negative  . Gallstones 02/21/2015  . GERD (gastroesophageal reflux disease)   . Hyperlipidemia   . Insomnia   . Lower back pain   . Osteoarthritis   . Ovarian cancer ([place]) 1995  . RLS (restless legs syndrome)   . Sedative, hypnotic or anxiolytic dependence with withdrawal, 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[place])   . Wears dentures    full upper and lower    Past Surgical History:  Procedure Laterality Date  . ABDOMINAL HYSTERECTOMY    . APPENDECTOMY    . BREAST BIOPSY Left 03/12/2015   fat necrosis  .  CESAREAN SECTION    . COLONOSCOPY WITH PROPOFOL N/A 05/30/2018   Procedure: COLONOSCOPY WITH PROPOFOL;  Surgeon: Manya Silvas, MD;  [facility];  Service: Endoscopy;  Laterality: N/A;  . EXCISION MORTON'S NEUROMA Left 10/07/2016   Procedure: EXCISION MORTON'S NEUROMA;  Surgeon: [name], DPM;  Location: [place];  Service: Podiatry;  Laterality: Left;  IV WITH LOCAL  . LIVER BIOPSY  2005   PerPt: Done to eval elevated LFTs and  biopsy provided no definite dx/cause of elevated LFTs  . MULTIPLE TOOTH EXTRACTIONS    . OOPHORECTOMY    . OSTECTOMY Right 04/10/2020   Procedure: DOUBLE OSTEOTOMY GENERAL W/ LOCAL;  Surgeon: [name], DPM;  Location: [place];  Service: Podiatry;  Laterality: Right;    Family Psychiatric History: I have reviewed family psychiatric history from my progress note on 10/12/2018  Family History:  Family History  Adopted: Yes  Problem Relation Age of Onset  . COPD Mother   . Depression Mother   . Colon cancer Mother        unsure of age  . Cancer Father        Lung CA, Skin Cancer--?type  . COPD Father   . Stroke Father   . Cancer Sister 51       cervical cancer  . COPD Brother   . Alcohol abuse Brother   . Stroke Brother   . COPD Maternal Aunt   . Depression Daughter   . Schizophrenia Son   . Breast cancer Neg Hx     Social History: I have reviewed social history from my progress note on 10/12/2018 Social History   Socioeconomic History  . Marital status: Married    Spouse name: Not on file  . Number of children: Not on file  . Years of education: Not on file  . Highest education level: Not on file  Occupational History  . Not on file  Tobacco Use  . Smoking status: Former Smoker    Packs/day: 1.00    Years: 30.00    Pack years: 30.00    Types: Cigarettes    Quit date: 04/08/2009    Years since quitting: 11.4  . Smokeless tobacco: Never Used  Vaping Use  . Vaping Use: Never used  Substance and Sexual Activity  . Alcohol use: Yes    Alcohol/week: 3.0 - 7.0 standard drinks    Types: 3 - 7 Shots of liquor per week    Comment: occasionally  . Drug use: Yes    Types: Marijuana    Comment: told to refrain until after surgery  . Sexual activity: Yes    Birth control/protection: Surgical  Other Topics Concern  . Not on file  Social History Narrative  . Not on file   Social Determinants of Health   Financial Resource Strain:   . Difficulty of  Paying Living Expenses: Not on file  Food Insecurity:   . Worried About Charity fundraiser in the Last Year: Not on file  . Ran Out of Food in the Last Year: Not on file  Transportation Needs:   . Lack of Transportation (Medical): Not on file  . Lack of Transportation (Non-Medical): Not on file  Physical Activity:   . Days of Exercise per Week: Not on file  . Minutes of Exercise per Session: Not on file  Stress:   . Feeling of Stress : Not on file  Social Connections:   . Frequency of Communication with Friends  and Family: Not on file  . Frequency of Social Gatherings with Friends and Family: Not on file  . Attends Religious Services: Not on file  . Active Member of Clubs or Organizations: Not on file  . Attends Archivist Meetings: Not on file  . Marital Status: Not on file    Allergies:  Allergies  Allergen Reactions  . Benadryl [Diphenhydramine Hcl]     Hives/ rapid heartrate  . Codeine     dizziness  . Vicodin [Hydrocodone-Acetaminophen] Other (See Comments)    dizziness    Metabolic Disorder Labs: Lab Results  Component Value Date   HGBA1C 5.6 09/14/2018   MPG 114 09/14/2018   No results found for: PROLACTIN Lab Results  Component Value Date   CHOL 167 06/18/2020   TRIG 165 (H) 06/18/2020   HDL 54 06/18/2020   CHOLHDL 3.1 06/18/2020   VLDL 18 03/04/2015   LDLCALC 87 06/18/2020   LDLCALC 73 12/19/2019   Lab Results  Component Value Date   TSH 1.51 12/19/2019   TSH 2.320 10/24/2018    Therapeutic Level Labs: Lab Results  Component Value Date   LITHIUM 0.3 (L) 10/24/2018   LITHIUM 0.49 (L) 01/13/2009   No results found for: VALPROATE No components found for:  CBMZ  Current Medications: Current Outpatient Medications  Medication Sig Dispense Refill  . alendronate (FOSAMAX) 70 MG tablet TAKE 1 TABLET (70 MG TOTAL) BY MOUTH ONCE A WEEK. TAKE WITH A FULL GLASS OF WATER ON AN EMPTY STOMACH. 12 tablet 3  . ammonium lactate (AMLACTIN) 12 %  cream APPLY TOPICALLY AS NEEDED FOR DRY SKIN 385 g 0  . benztropine (COGENTIN) 1 MG tablet Take 0.5 tablets (0.5 mg total) by mouth 2 (two) times daily as needed for tremors. 90 tablet 1  . budesonide-formoterol (SYMBICORT) 160-4.5 MCG/ACT inhaler Inhale 2 puffs into the lungs 2 (two) times daily. 1 each 6  . escitalopram (LEXAPRO) 10 MG tablet Take 1 tablet (10 mg total) by mouth daily. 90 tablet 0  . esomeprazole (NEXIUM) 20 MG capsule Take 1 capsule (20 mg total) by mouth daily as needed (take on empty stomach). 90 capsule 1  . fluticasone (FLONASE) 50 MCG/ACT nasal spray Place 2 sprays into both nostrils daily. 16 mL 2  . hydrOXYzine (ATARAX/VISTARIL) 50 MG tablet TAKE 1 TABLET TWICE DAILY AS NEEDED FOR SEVERE ANXIETY ATTACKS 180 tablet 0  . lamoTRIgine (LAMICTAL) 150 MG tablet Take 1 tablet (150 mg total) by mouth daily. 90 tablet 0  . meloxicam (MOBIC) 7.5 MG tablet TAKE 1 TABLET TWICE DAILY AS NEEDED FOR PAIN 180 tablet 0  . rosuvastatin (CRESTOR) 10 MG tablet TAKE 1 TABLET EVERY DAY 90 tablet 0  . traZODone (DESYREL) 100 MG tablet TAKE 1 TABLET (100 MG TOTAL) BY MOUTH AT BEDTIME. 90 tablet 1  . ziprasidone (GEODON) 80 MG capsule Take 1 capsule (80 mg total) by mouth daily with supper. 90 capsule 0   No current facility-administered medications for this visit.     Musculoskeletal: Strength & Muscle Tone: UTA Gait & Station: UTA Patient leans: N/A  Psychiatric Specialty Exam: Review of Systems  Psychiatric/Behavioral: The patient is nervous/anxious.   All other systems reviewed and are negative.   There were no vitals taken for this visit.There is no height or weight on file to calculate BMI.  General Appearance: Casual  Eye Contact:  Fair  Speech:  Clear and Coherent  Volume:  Normal  Mood:  Anxious, slightly euphoric  Affect:  Congruent  Thought Process:  Goal Directed and Descriptions of Associations: Intact  Orientation:  Full (Time, Place, and Person)  Thought Content:  Logical   Suicidal Thoughts:  No  Homicidal Thoughts:  No  Memory:  Immediate;   Fair Recent;   Fair Remote;   Fair  Judgement:  Fair  Insight:  Fair  Psychomotor Activity:  Normal  Concentration:  Concentration: Fair and Attention Span: Fair  Recall:  [employer] of Knowledge: Fair  Language: Fair  Akathisia:  No  Handed:  Right  AIMS (if indicated): UTA  Assets:  Communication Skills Desire for Improvement Social Support  ADL's:  Intact  Cognition: WNL  Sleep:  Fair   Screenings: AIMS     Office Visit from 10/24/2018 in [place] Office Visit from 04/04/2018 in [place] Office Visit from 01/03/2018 in [place] Office Visit from 11/18/2016 in [place] Total Score 5 0 0 0    PHQ2-9     Office Visit from 02/07/2020 in [place] Office Visit from 12/19/2019 in [place] Office Visit from 10/21/2018 in [place] Office Visit from 09/29/2018 in [place] Office Visit from 09/14/2018 in [place]  PHQ-2 Total Score 0 0 4 4 5   PHQ-9 Total Score -- -- 21 21 19        Assessment and Plan: Stacy Chambers is a 54 year old Caucasian female, married, lives in [place], has a history of bipolar disorder, anxiety disorder, COPD, RLS, elevated liver enzymes was evaluated by telemedicine today.  Patient with psychosocial stressors of death of her son, current pandemic, relationship struggles.  She is currently struggling with mood symptoms and will benefit from the following medication readjustment.  Plan Bipolar disorder mixed-unstable Continue Geodon 80 mg p.o. daily-recently increased. Increase Lamictal to 150 mg p.o. daily Cogentin 0.5 mg p.o. 3 times daily as needed as needed for side effects of Geodon. Restart CBT.  GAD-stable Lexapro 10 mg p.o.  daily Hydroxyzine 50 mg p.o. twice daily as needed for severe anxiety symptoms  Insomnia-stable Trazodone 100 mg p.o. nightly  Follow-up in clinic in 1 month or sooner if needed.  I have spent atleast 20 minutes face to face by video with patient today. More than 50 % of the time was spent for preparing to see the patient ( e.g., review of test, records ),  ordering medications and test ,psychoeducation and supportive psychotherapy and care coordination,as well as documenting clinical information in electronic health record. This note was generated in part or whole with voice recognition software. Voice recognition is usually quite accurate but there are transcription errors that can and very often do occur. I apologize for any typographical errors that were not detected and corrected.      Ursula Alert, MD 09/24/2020, 10:25 AM

## 2020-10-02 ENCOUNTER — Other Ambulatory Visit: Payer: Self-pay | Admitting: Family Medicine

## 2020-10-02 MED ORDER — BUDESONIDE-FORMOTEROL FUMARATE 160-4.5 MCG/ACT IN AERO: 2.0000 | INHALATION_SPRAY | Freq: Two times a day (BID) | RESPIRATORY_TRACT | 3 refills | Status: DC

## 2020-10-02 MED ORDER — ROSUVASTATIN CALCIUM 10 MG PO TABS
10.0000 mg | ORAL_TABLET | Freq: Every day | ORAL | 3 refills | Status: DC
Start: 2020-10-02 — End: 2020-11-26

## 2020-10-26 ENCOUNTER — Other Ambulatory Visit: Payer: Self-pay | Admitting: Family Medicine

## 2020-10-28 ENCOUNTER — Telehealth (INDEPENDENT_AMBULATORY_CARE_PROVIDER_SITE_OTHER): Payer: Medicare HMO | Admitting: Psychiatry

## 2020-10-28 ENCOUNTER — Encounter: Payer: Self-pay | Admitting: Psychiatry

## 2020-10-28 ENCOUNTER — Other Ambulatory Visit: Payer: Self-pay

## 2020-10-28 DIAGNOSIS — F411 Generalized anxiety disorder: Secondary | ICD-10-CM

## 2020-10-28 DIAGNOSIS — F5105 Insomnia due to other mental disorder: Secondary | ICD-10-CM

## 2020-10-28 DIAGNOSIS — F3161 Bipolar disorder, current episode mixed, mild: Secondary | ICD-10-CM | POA: Diagnosis not present

## 2020-10-28 MED ORDER — TRAZODONE HCL 100 MG PO TABS: 100.0000 mg | ORAL_TABLET | Freq: Every evening | ORAL | 0 refills | Status: DC | PRN
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
## 2020-10-28 NOTE — Patient Instructions (Signed)

## 2020-11-12 ENCOUNTER — Other Ambulatory Visit: Payer: Self-pay | Admitting: Psychiatry

## 2020-11-12 DIAGNOSIS — F3178 Bipolar disorder, in full remission, most recent episode mixed: Secondary | ICD-10-CM

## 2020-11-20 ENCOUNTER — Other Ambulatory Visit: Payer: Self-pay | Admitting: Psychiatry

## 2020-11-20 DIAGNOSIS — F411 Generalized anxiety disorder: Secondary | ICD-10-CM

## 2020-11-21 ENCOUNTER — Other Ambulatory Visit: Payer: Self-pay | Admitting: Family Medicine

## 2020-11-22 ENCOUNTER — Other Ambulatory Visit: Payer: Self-pay | Admitting: Psychiatry

## 2020-11-22 DIAGNOSIS — F5105 Insomnia due to other mental disorder: Secondary | ICD-10-CM

## 2020-11-22 DIAGNOSIS — F411 Generalized anxiety disorder: Secondary | ICD-10-CM

## 2020-11-25 ENCOUNTER — Telehealth (INDEPENDENT_AMBULATORY_CARE_PROVIDER_SITE_OTHER): Payer: Medicare HMO | Admitting: Psychiatry

## 2020-11-25 ENCOUNTER — Other Ambulatory Visit: Payer: Self-pay

## 2020-11-25 ENCOUNTER — Encounter: Payer: Self-pay | Admitting: Psychiatry

## 2020-11-25 ENCOUNTER — Telehealth: Payer: Self-pay | Admitting: Psychiatry

## 2020-11-25 DIAGNOSIS — F411 Generalized anxiety disorder: Secondary | ICD-10-CM | POA: Diagnosis not present

## 2020-11-25 DIAGNOSIS — F5105 Insomnia due to other mental disorder: Secondary | ICD-10-CM | POA: Diagnosis not present

## 2020-11-25 DIAGNOSIS — F3177 Bipolar disorder, in partial remission, most recent episode mixed: Secondary | ICD-10-CM | POA: Diagnosis not present

## 2020-11-25 DIAGNOSIS — R41840 Attention and concentration deficit: Secondary | ICD-10-CM | POA: Diagnosis not present

## 2020-11-25 DIAGNOSIS — F3161 Bipolar disorder, current episode mixed, mild: Secondary | ICD-10-CM

## 2020-11-25 MED ORDER — GUANFACINE HCL ER 1 MG PO TB24: 1.0000 mg | ORAL_TABLET | Freq: Every day | ORAL | 0 refills | Status: DC

## 2020-11-25 NOTE — Telephone Encounter (Signed)
Please refer this patient for neuropsychological testing given her cognitive changes, attention and focus deficit.

## 2020-11-25 NOTE — Progress Notes (Signed)
Virtual Visit via Video Note  I connected with Stacy Chambers on 11/25/20 at  2:20 PM EST by a video enabled telemedicine application and verified that I am speaking with the correct person using two identifiers.  Location Provider Location : [facility] Patient Location : Home  Participants: Patient , Provider    I discussed the limitations of evaluation and management by telemedicine and the availability of in person appointments. The patient expressed understanding and agreed to proceed.    I discussed the assessment and treatment plan with the patient. The patient was provided an opportunity to ask questions and all were answered. The patient agreed with the plan and demonstrated an understanding of the instructions.   The patient was advised to call back or seek an in-person evaluation if the symptoms worsen or if the condition fails to improve as anticipated.   [place] MD OP Progress Note  11/25/2020 5:02 PM Stacy Chambers  MRN:  [id number]  Chief Complaint:  Chief Complaint    Follow-up     HPI: Stacy Chambers is a 55 year old Caucasian female, lives in [place], is married, has a history of bipolar disorder, GAD, insomnia, neuroleptic induced akathisia, memory loss, RLS, COPD, chronic pain, abnormal liver function test, was evaluated by telemedicine today.  Patient today reports her sleep has improved to some extent.  She however reports she has been having vivid dreams which happens a few times a week.  She reports she however does not want to make any changes with the trazodone yet and wants to give it a chance.  She will let writer know if it gets worse.  Patient reports she has noticed problems with her attention and focus.  She now believes probably this has been going on since a very long time.  She reports she was in special ed classes when she was in school.  She only went to ninth grade and dropped out since she was homeless at that point.  Patient however reports she  struggles with hyperactivity, often feels as though she needs to keep going, is distracted easily, has problems with keeping things organized, has problems focusing on task, keeping up with projects and so on.  She reports she is very forgetful and that also is getting worse.   Patient denies any suicidality, homicidality or perceptual disturbances.  Patient is compliant on medications.  She denies any other concerns today.    Visit Diagnosis:    ICD-10-CM   1. Bipolar 1 disorder, mixed, partial remission (HCC)  F31.77   2. Generalized anxiety disorder  F41.1   3. Insomnia due to mental disorder  F51.05   4. Attention and concentration deficit  R41.840 guanFACINE (INTUNIV) 1 MG TB24 ER tablet    Past Psychiatric History: I have reviewed past psychiatric history from my progress note on 10/12/2018.  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   Chronic obstructive pulmonary disease ([place]) 10/03/2015   COPD (chronic obstructive pulmonary disease) ([place])    no inhalers   Elevated liver function tests 11/26/2004   H/O  NEGATIVE LIVER BIOPSY, workup negative   Gallstones 02/21/2015   GERD (gastroesophageal reflux disease)    Hyperlipidemia    Insomnia    Lower back pain    Osteoarthritis    Ovarian cancer ([place]) 1995   RLS (restless legs syndrome)    Sedative, hypnotic or anxiolytic dependence with withdrawal, 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[place])    Wears dentures    full upper and lower    Past Surgical History:  Procedure Laterality Date   ABDOMINAL HYSTERECTOMY     APPENDECTOMY     BREAST BIOPSY Left  03/12/2015   fat necrosis   CESAREAN SECTION     COLONOSCOPY WITH PROPOFOL N/A 05/30/2018   Procedure: COLONOSCOPY WITH PROPOFOL;  Surgeon: Manya Silvas, MD;  [facility];  Service: Endoscopy;  Laterality: N/A;   EXCISION MORTON'S NEUROMA Left 10/07/2016   Procedure: EXCISION MORTON'S NEUROMA;  Surgeon: [name], DPM;  Location: [facility];  Service: Podiatry;  Laterality: Left;  IV WITH LOCAL   LIVER BIOPSY  2005   PerPt: Done to eval elevated LFTs and biopsy provided no definite dx/cause of elevated LFTs   MULTIPLE TOOTH EXTRACTIONS     OOPHORECTOMY     OSTECTOMY Right 04/10/2020   Procedure: DOUBLE OSTEOTOMY GENERAL W/ LOCAL;  Surgeon: [name], DPM;  Location: [facility];  Service: Podiatry;  Laterality: Right;    Family Psychiatric History: I have reviewed family psychiatric history from my progress note on 10/12/2018  Family History:  Family History  Adopted: Yes  Problem Relation Age of Onset   COPD Mother    Depression Mother    Colon cancer Mother        unsure of age   49 Father        Lung CA, Skin Cancer--?type   COPD Father    Stroke Father    Cancer Sister 81       cervical cancer   COPD Brother    Alcohol abuse Brother    Stroke Brother    COPD Maternal Aunt    Depression Daughter    Schizophrenia Son    Breast cancer Neg Hx     Social History: I have reviewed social history from my progress note on 10/12/2018 Social History   Socioeconomic History   Marital status: Married    Spouse name: Not on file   Number of children: Not on file   Years of education: Not on file   Highest education level: Not on file  Occupational History   Not on file  Tobacco Use   Smoking status: Former Smoker    Packs/day: 1.00    Years: 30.00    Pack years: 30.00    Types: Cigarettes    Quit date: 04/08/2009    Years since quitting: 11.6   Smokeless tobacco: Never Used  Vaping Use   Vaping  Use: Never used  Substance and Sexual Activity   Alcohol use: Yes    Alcohol/week: 3.0 - 7.0 standard drinks    Types: 3 - 7 Shots of liquor per week    Comment: occasionally   Drug use: Yes    Types: Marijuana    Comment: told to refrain until after surgery   Sexual activity: Yes    Birth control/protection: Surgical  Other Topics Concern   Not on file  Social History Narrative   Not on file   Social Determinants of Health   Financial Resource Strain: Not on file  Food Insecurity: Not on file  Transportation Needs: Not on file  Physical Activity: Not on file  Stress: Not on file  Social Connections: Not on file    Allergies:  Allergies  Allergen Reactions   Benadryl [Diphenhydramine Hcl]     Hives/ rapid heartrate   Codeine     dizziness   Vicodin [Hydrocodone-Acetaminophen] Other (See Comments)    dizziness    Metabolic Disorder Labs: Lab Results  Component Value Date   HGBA1C 5.6 09/14/2018   MPG 114 09/14/2018   No results found for: PROLACTIN Lab Results  Component Value Date   CHOL 167 06/18/2020   TRIG 165 (H) 06/18/2020   HDL 54 06/18/2020   CHOLHDL 3.1 06/18/2020   VLDL 18 03/04/2015   LDLCALC 87 06/18/2020   LDLCALC 73 12/19/2019   Lab Results  Component Value Date   TSH 1.51 12/19/2019   TSH 2.320 10/24/2018    Therapeutic Level Labs: Lab Results  Component Value Date   LITHIUM 0.3 (L) 10/24/2018   LITHIUM 0.49 (L) 01/13/2009   No results found for: VALPROATE No components found for:  CBMZ  Current Medications: Current Outpatient Medications  Medication Sig Dispense Refill   guanFACINE (INTUNIV) 1 MG TB24 ER tablet Take 1 tablet (1 mg total) by mouth daily. 30 tablet 0   alendronate (FOSAMAX) 70 MG tablet TAKE 1 TABLET (70 MG TOTAL) BY MOUTH ONCE A WEEK. TAKE WITH A FULL GLASS OF WATER ON AN EMPTY STOMACH. 12 tablet 3   ammonium lactate (AMLACTIN) 12 % cream APPLY TOPICALLY AS NEEDED FOR DRY SKIN 385 g 0   benztropine  (COGENTIN) 1 MG tablet TAKE 1/2 TABLET TWICE DAILY AS NEEDED FOR  TREMORS 90 tablet 1   budesonide-formoterol (SYMBICORT) 160-4.5 MCG/ACT inhaler Inhale 2 puffs into the lungs 2 (two) times daily. 3 each 3   escitalopram (LEXAPRO) 10 MG tablet TAKE 1 TABLET (10 MG TOTAL) BY MOUTH DAILY - (DOSE INCREASE) 90 tablet 0   esomeprazole (NEXIUM) 20 MG capsule Take 1 capsule (20 mg total) by mouth daily as needed (take on empty stomach). 90 capsule 1   FLULAVAL QUADRIVALENT 0.5 ML injection      fluticasone (FLONASE) 50 MCG/ACT nasal spray Place 2 sprays into both nostrils daily. 16 mL 2   hydrOXYzine (ATARAX/VISTARIL) 50 MG tablet TAKE 1 TABLET TWICE DAILY AS NEEDED FOR SEVERE ANXIETY ATTACKS 180 tablet 0   lamoTRIgine (LAMICTAL) 150 MG tablet Take 1 tablet (150 mg total) by mouth daily. 90 tablet 0   meloxicam (MOBIC) 7.5 MG tablet TAKE 1 TABLET TWICE DAILY AS NEEDED FOR PAIN 180 tablet 0   rosuvastatin (CRESTOR) 10 MG tablet Take 1 tablet (10 mg total) by mouth daily. 90 tablet 3   traZODone (DESYREL) 100 MG tablet Take 1-1.5 tablets (100-150 mg total) by mouth at bedtime as needed for sleep. 135 tablet 0   ziprasidone (GEODON) 80 MG capsule TAKE 1 CAPSULE (80 MG TOTAL) BY MOUTH DAILY WITH SUPPER - (DOSE INCREASE) 90 capsule 0   No current facility-administered medications for this visit.     Musculoskeletal: Strength & Muscle Tone: UTA Gait & Station: UTA Patient leans: N/A  Psychiatric Specialty Exam: Review of Systems  Psychiatric/Behavioral: Positive for decreased concentration and sleep disturbance. The patient is nervous/anxious.   All other systems reviewed and are negative.   There were no vitals taken for this visit.There is no height or weight on file to calculate BMI.  General Appearance: Casual  Eye Contact:  Fair  Speech:  Clear  and Coherent  Volume:  Normal  Mood:  Anxious  Affect:  Appropriate  Thought Process:  Goal Directed and Descriptions of Associations:  Intact  Orientation:  Full (Time, Place, and Person)  Thought Content: Logical   Suicidal Thoughts:  No  Homicidal Thoughts:  No  Memory:  Immediate;   Fair Recent;   Fair Remote;   Fair  Judgement:  Fair  Insight:  Fair  Psychomotor Activity:  Normal  Concentration:  Concentration: Fair and Attention Span: Fair  Recall:  [employer] of Knowledge: Fair  Language: Fair  Akathisia:  No  Handed:  Right  AIMS (if indicated): UTA  Assets:  Communication Skills Desire for Improvement Housing Social Support  ADL's:  Intact  Cognition: WNL  Sleep:  improving - but reports vivid dreams   Screenings: [facility] Office Visit from 10/24/2018 in [facility] Office Visit from 04/04/2018 in [facility] Office Visit from 01/03/2018 in [facility] Office Visit from 11/18/2016 in [facility] Total Score 5 0 0 0    PHQ2-9   [place] Office Visit from 02/07/2020 in [facility] Visit from 12/19/2019 in [facility] Office Visit from 10/21/2018 in [facility] Office Visit from 09/29/2018 in [facility] Office Visit from 09/14/2018 in [facility]  PHQ-2 Total Score 0 0 4 4 5   PHQ-9 Total Score -- -- 21 21 19        Assessment and Plan: Stacy Chambers is a 55 year old Caucasian female, married, lives in [place], has a history of bipolar disorder, anxiety disorder, COPD, RLS, elevated liver enzymes was evaluated by telemedicine today.  Patient with psychosocial stressors of the death of her son, current pandemic, relationship struggles.  Patient is currently making some progress with regards to her sleep however currently reports problems with hyperactivity focus and concentration.  Plan as noted below.  Plan Bipolar disorder mixed-improving Geodon 80 mg p.o. daily Lamictal  150 mg p.o. daily Cogentin 0.5 mg p.o. 3 times daily as needed for side effects of Geodon. Start Intuniv 1 mg p.o. nightly. Patient was advised to restart CBT in the past-pending  GAD-stable Lexapro 10 mg p.o. daily Hydroxyzine 50 mg p.o. twice daily as needed for severe anxiety attacks  Insomnia-unstable Trazodone 100-150 mg p.o. nightly as needed Continue hydroxyzine 25 mg as needed at bedtime if she has a lot of interrupted sleep in spite of taking trazodone. Patient to continue to keep a sleep log.  Attention and focus deficit-unstable Patient with history of cognitive issues was referred to neurology in the past however she has been noncompliant. Patient today reports having problems with hyperactivity focus and concentration and possibly being told she might have ADHD in the past.  Patient however reports she never had testing done. Will refer for neuropsychological testing for diagnostic clarification given her cognitive issues, attention and focus problems. Patient completed ASRS-scored high on the same.   Follow-up in clinic in 2 weeks or sooner if needed.  I have spent atleast 30 minutes face to face by video with patient today. More than 50 % of the time was spent for preparing to see the patient ( e.g., review of test, records ), obtaining and to review and separately obtained history , ordering medications and test ,psychoeducation and supportive psychotherapy and care coordination,as well as documenting clinical information in electronic health record. This note was  generated in part or whole with voice recognition software. Voice recognition is usually quite accurate but there are transcription errors that can and very often do occur. I apologize for any typographical errors that were not detected and corrected.       Ursula Alert, MD 11/26/2020, 8:18 AM

## 2020-11-25 NOTE — Patient Instructions (Signed)
Guanfacine extended-release oral tablets What is this medicine? GUANFACINE [facility] fa seen) is used to treat attention-deficit hyperactivity disorder (ADHD). This medicine may be used for other purposes; ask your health care provider or pharmacist if you have questions. COMMON BRAND NAME(S): Intuniv What should I tell my health care provider before I take this medicine? They need to know if you have any of these conditions:  high blood pressure  kidney disease  liver disease  low blood pressure  slow heart rate  an unusual or allergic reaction to guanfacine, other medicines, foods, dyes, or preservatives  pregnant or trying to get pregnant  breast-feeding How should I use this medicine? Take this medicine by mouth with a glass of water. Follow the directions on the prescription label. Do not cut, crush, or chew this medicine. Do not take this medicine with a high-fat meal. Take your medicine at regular intervals. Do not take it more often than directed. Do not stop taking except on your doctor's advice. Stopping this medicine too quickly may cause serious side effects. Ask your doctor or health care professional for advice. This drug may be prescribed for children as young as 6 years. Talk to your doctor if you have any questions. Overdosage: If you think you have taken too much of this medicine contact a poison control center or emergency room at once. NOTE: This medicine is only for you. Do not share this medicine with others. What if I miss a dose? If you miss a dose, take it as soon as you can. If it is almost time for your next dose, take only that dose. Do not take double or extra doses. If you miss 2 or more doses in a row, you should contact your doctor or health care professional. You may need to restart your medicine at a lower dose. What may interact with this medicine?  certain medicines for blood pressure, heart disease, irregular heart beat  certain medicines for  depression, anxiety, or psychotic disturbances  certain medicines for seizures like carbamazepine, phenobarbital, phenytoin  certain medicines for sleep  ketoconazole  narcotic medicines for pain  rifampin This list may not describe all possible interactions. Give your health care provider a list of all the medicines, herbs, non-prescription drugs, or dietary supplements you use. Also tell them if you smoke, drink alcohol, or use illegal drugs. Some items may interact with your medicine. What should I watch for while using this medicine? Visit your doctor or health care professional for regular checks on your progress. Check your heart rate and blood pressure as directed. Ask your doctor or health care professional what your heart rate and blood pressure should be and when you should contact him or her. You may get dizzy or drowsy. Do not drive, use machinery, or do anything that needs mental alertness until you know how this medicine affects you. Do not stand or sit up quickly, especially if you are an older patient. This reduces the risk of dizzy or fainting spells. Alcohol can make you more drowsy and dizzy. Avoid alcoholic drinks. Avoid becoming dehydrated or overheated while taking this medicine. Tell your healthcare provider if you have been vomiting and cannot take this medicine because you may be at risk for a sudden and large increase in blood pressure called rebound hypertension. Your mouth may get dry. Chewing sugarless gum or sucking hard candy, and drinking plenty of water may help. Contact your doctor if the problem does not go away or is severe. What  side effects may I notice from receiving this medicine? Side effects that you should report to your doctor or health care professional as soon as possible:  allergic reactions like skin rash, itching or hives, swelling of the face, lips, or tongue  changes in emotions or moods  chest pain or chest tightness  signs and symptoms of  low blood pressure like dizziness; feeling faint or lightheaded, falls; unusually weak or tired  unusually slow heartbeat Side effects that usually do not require medical attention (report to your doctor or health care professional if they continue or are bothersome):  drowsiness  dry mouth  headache  nausea  tiredness This list may not describe all possible side effects. Call your doctor for medical advice about side effects. You may report side effects to FDA at 1-800-FDA-1088. Where should I keep my medicine? Keep out of the reach of children. Store at room temperature between 15 and 30 degrees C (59 and 86 degrees F). Throw away any unused medicine after the expiration date. NOTE: This sheet is a summary. It may not cover all possible information. If you have questions about this medicine, talk to your doctor, pharmacist, or health care provider.  2021 Elsevier/Gold Standard (2017-01-19 19:38:26)  

## 2020-11-26 ENCOUNTER — Other Ambulatory Visit: Payer: Self-pay | Admitting: Family Medicine

## 2020-12-05 NOTE — Telephone Encounter (Signed)
Referral sent 

## 2020-12-12 ENCOUNTER — Other Ambulatory Visit: Payer: Self-pay | Admitting: Family Medicine

## 2020-12-16 ENCOUNTER — Other Ambulatory Visit: Payer: Self-pay | Admitting: Family Medicine

## 2020-12-16 ENCOUNTER — Telehealth (INDEPENDENT_AMBULATORY_CARE_PROVIDER_SITE_OTHER): Payer: Medicare HMO | Admitting: Psychiatry

## 2020-12-16 ENCOUNTER — Other Ambulatory Visit: Payer: Self-pay

## 2020-12-16 ENCOUNTER — Encounter: Payer: Self-pay | Admitting: Psychiatry

## 2020-12-16 DIAGNOSIS — F3162 Bipolar disorder, current episode mixed, moderate: Secondary | ICD-10-CM | POA: Diagnosis not present

## 2020-12-16 DIAGNOSIS — F5105 Insomnia due to other mental disorder: Secondary | ICD-10-CM | POA: Diagnosis not present

## 2020-12-16 DIAGNOSIS — Z79899 Other long term (current) drug therapy: Secondary | ICD-10-CM | POA: Diagnosis not present

## 2020-12-16 DIAGNOSIS — F411 Generalized anxiety disorder: Secondary | ICD-10-CM

## 2020-12-16 DIAGNOSIS — F3177 Bipolar disorder, in partial remission, most recent episode mixed: Secondary | ICD-10-CM | POA: Insufficient documentation

## 2020-12-16 DIAGNOSIS — Z9111 Patient's noncompliance with dietary regimen: Secondary | ICD-10-CM

## 2020-12-16 DIAGNOSIS — Z91199 Patient's noncompliance with other medical treatment and regimen due to unspecified reason: Secondary | ICD-10-CM

## 2020-12-16 DIAGNOSIS — R41840 Attention and concentration deficit: Secondary | ICD-10-CM

## 2020-12-16 MED ORDER — TRAZODONE HCL 100 MG PO TABS: 200.0000 mg | ORAL_TABLET | Freq: Every evening | ORAL | 0 refills | Status: DC | PRN

## 2020-12-16 MED ORDER — LAMOTRIGINE 150 MG PO TABS: 150.0000 mg | ORAL_TABLET | Freq: Every day | ORAL | 0 refills | Status: DC

## 2020-12-16 MED ORDER — ZIPRASIDONE HCL 20 MG PO CAPS: 20.0000 mg | ORAL_CAPSULE | Freq: Every day | ORAL | 0 refills | Status: DC

## 2020-12-16 MED ORDER — BENZTROPINE MESYLATE 1 MG PO TABS: 1.0000 mg | ORAL_TABLET | Freq: Two times a day (BID) | ORAL | 0 refills | Status: DC | PRN

## 2020-12-16 NOTE — Patient Instructions (Signed)
[facility] [address] [place],  [address] [phone]

## 2020-12-16 NOTE — Progress Notes (Signed)
Virtual Visit via Video Note  I connected with Stacy Chambers on 12/16/20 at 11:00 AM EST by a video enabled telemedicine application and verified that I am speaking with the correct person using two identifiers.  Location Provider Location : [facility] Patient Location : Car  Participants: Patient , Provider   I discussed the limitations of evaluation and management by telemedicine and the availability of in person appointments. The patient expressed understanding and agreed to proceed.    I discussed the assessment and treatment plan with the patient. The patient was provided an opportunity to ask questions and all were answered. The patient agreed with the plan and demonstrated an understanding of the instructions.   The patient was advised to call back or seek an in-person evaluation if the symptoms worsen or if the condition fails to improve as anticipated.   [facility] MD OP Progress Note  12/16/2020 8:29 PM Stacy Chambers  MRN:  [id number]  Chief Complaint:  Chief Complaint    Follow-up     HPI: Stacy Chambers is a 55 year old Caucasian female lives in [place], is married, has a history of bipolar disorder, GAD, insomnia, neuroleptic induced akathisia, memory loss, RLS, COPD, chronic pain, abnormal liver function test was evaluated by telemedicine today.  Patient reports she has been having trouble with depressive symptoms.  She reports she often feels sad, struggles with lack of motivation.  Patient reports she has been unable to do the things that she normally enjoys.  Patient also reports paranoia usually about her husband.  Her husband was able to recently bring her attention to this.  Patient reports she often worries that her husband is having extramarital affair and is suspicious of it all the time.  Patient also reports she has been hearing noises and sounds which is getting worse since the past 1 month or so.  Patient reports sleep as restless.  She reports she feels unrested  when she wakes up in the morning.  She wakes up several times at night and is unable to fall back asleep.  Patient reports she stopped the Intuniv since she did not like the effect and it was keeping her up at night.  Patient continues to struggle with attention and focus problems as well as memory issues.  Patient was referred to neurologist in the past for evaluation however she has been noncompliant.  She was also referred for ADHD testing last visit, she is noncompliant.  Patient denies any suicidality or homicidality.  Patient reports she is compliant on her medications as prescribed.  Patient denies any other concerns today.  Visit Diagnosis:    ICD-10-CM   1. Bipolar 1 disorder, mixed, moderate (HCC)  F31.62 ziprasidone (GEODON) 20 MG capsule  2. Generalized anxiety disorder  F41.1 traZODone (DESYREL) 100 MG tablet  3. Insomnia due to mental disorder  F51.05 benztropine (COGENTIN) 1 MG tablet  4. Attention and concentration deficit  R41.840   5. High risk medication use  Z79.899 lamoTRIgine (LAMICTAL) 150 MG tablet    EKG 12-Lead  6. Noncompliance with treatment plan  Z91.11     Past Psychiatric History: I have reviewed past psychiatric history from my progress note on 10/12/2018.  Past trials of BuSpar, Zoloft, carbamazepine.  Past Medical History:  Past Medical History:  Diagnosis Date  . Abnormal blood chemistry 10/03/2015  . Anginal pain ([place])   . Anxiety   . Anxiety, generalized 02/21/2015  . Bipolar affective disorder ([place]) 03/08/2013   Overview:  Last Assessment &  Plan:  Continue meds, f/u with psych Restart benzo at 1 po BID prn,    . Bipolar disorder ([place])   . 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 Chronic obstructive pulmonary disease ([place]) 10/03/2015  . COPD (chronic obstructive pulmonary disease) (HCC)    no inhalers  . Elevated liver function tests 11/26/2004   H/O NEGATIVE LIVER BIOPSY, workup negative  . Gallstones 02/21/2015  . GERD (gastroesophageal reflux disease)    . Hyperlipidemia   . Insomnia   . Lower back pain   . Osteoarthritis   . Ovarian cancer ([place]) 1995  . RLS (restless legs syndrome)   . Sedative, hypnotic or anxiolytic dependence with withdrawal, 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[place])   . Wears dentures    full upper and lower    Past Surgical History:  Procedure Laterality Date  . ABDOMINAL HYSTERECTOMY    . APPENDECTOMY    . BREAST BIOPSY Left 03/12/2015   fat necrosis  . CESAREAN SECTION    . COLONOSCOPY WITH PROPOFOL N/A 05/30/2018   Procedure: COLONOSCOPY WITH PROPOFOL;  Surgeon: Manya Silvas, MD;  Location: [facility] ENDOSCOPY;  Service: Endoscopy;  Laterality: N/A;  . EXCISION MORTON'S NEUROMA Left 10/07/2016   Procedure: EXCISION MORTON'S NEUROMA;  Surgeon: [name], DPM;  Location: [facility];  Service: Podiatry;  Laterality: Left;  IV WITH LOCAL  . LIVER BIOPSY  2005   PerPt: Done to eval elevated LFTs and biopsy provided no definite dx/cause of elevated LFTs  . MULTIPLE TOOTH EXTRACTIONS    . OOPHORECTOMY    . OSTECTOMY Right 04/10/2020   Procedure: DOUBLE OSTEOTOMY GENERAL W/ LOCAL;  Surgeon: [name], DPM;  Location: [facility];  Service: Podiatry;  Laterality: Right;    Family Psychiatric History: I have reviewed family psychiatric history from my progress note on 10/12/2018  Family History:  Family History  Adopted: Yes  Problem Relation Age of Onset  . COPD Mother   . Depression Mother   . Colon cancer Mother        unsure of age  . Cancer Father        Lung CA, Skin Cancer--?type  . COPD Father   . Stroke Father   . Cancer Sister 66       cervical cancer  . COPD Brother   . Alcohol abuse Brother   . Stroke Brother   . COPD Maternal Aunt   . Depression Daughter   . Schizophrenia Son   . Breast cancer Neg Hx     Social History: Reviewed social history from my progress note on 10/12/2018 Social History   Socioeconomic History  . Marital status: Married    Spouse name: Not on file  .  Number of children: Not on file  . Years of education: Not on file  . Highest education level: Not on file  Occupational History  . Not on file  Tobacco Use  . Smoking status: Former Smoker    Packs/day: 1.00    Years: 30.00    Pack years: 30.00    Types: Cigarettes    Quit date: 04/08/2009    Years since quitting: 11.6  . Smokeless tobacco: Never Used  Vaping Use  . Vaping Use: Never used  Substance and Sexual Activity  . Alcohol use: Yes    Alcohol/week: 3.0 - 7.0 standard drinks    Types: 3 - 7 Shots of liquor per week    Comment: occasionally  . Drug use: Yes    Types: Marijuana  Comment: told to refrain until after surgery  . Sexual activity: Yes    Birth control/protection: Surgical  Other Topics Concern  . Not on file  Social History Narrative  . Not on file   Social Determinants of Health   Financial Resource Strain: Not on file  Food Insecurity: Not on file  Transportation Needs: Not on file  Physical Activity: Not on file  Stress: Not on file  Social Connections: Not on file    Allergies:  Allergies  Allergen Reactions  . Benadryl [Diphenhydramine Hcl]     Hives/ rapid heartrate  . Codeine     dizziness  . Vicodin [Hydrocodone-Acetaminophen] Other (See Comments)    dizziness    Metabolic Disorder Labs: Lab Results  Component Value Date   HGBA1C 5.6 09/14/2018   MPG 114 09/14/2018   No results found for: PROLACTIN Lab Results  Component Value Date   CHOL 167 06/18/2020   TRIG 165 (H) 06/18/2020   HDL 54 06/18/2020   CHOLHDL 3.1 06/18/2020   VLDL 18 03/04/2015   LDLCALC 87 06/18/2020   LDLCALC 73 12/19/2019   Lab Results  Component Value Date   TSH 1.51 12/19/2019   TSH 2.320 10/24/2018    Therapeutic Level Labs: Lab Results  Component Value Date   LITHIUM 0.3 (L) 10/24/2018   LITHIUM 0.49 (L) 01/13/2009   No results found for: VALPROATE No components found for:  CBMZ  Current Medications: Current Outpatient Medications   Medication Sig Dispense Refill  . ziprasidone (GEODON) 20 MG capsule Take 1 capsule (20 mg total) by mouth daily with breakfast. 90 capsule 0  . alendronate (FOSAMAX) 70 MG tablet TAKE 1 TABLET (70 MG TOTAL) BY MOUTH ONCE A WEEK. TAKE WITH A FULL GLASS OF WATER ON AN EMPTY STOMACH. 12 tablet 3  . ammonium lactate (AMLACTIN) 12 % cream APPLY TOPICALLY AS NEEDED FOR DRY SKIN 385 g 0  . benztropine (COGENTIN) 1 MG tablet Take 1 tablet (1 mg total) by mouth 2 (two) times daily as needed for tremors. 180 tablet 0  . budesonide-formoterol (SYMBICORT) 160-4.5 MCG/ACT inhaler Inhale 2 puffs into the lungs 2 (two) times daily. 3 each 3  . escitalopram (LEXAPRO) 10 MG tablet TAKE 1 TABLET (10 MG TOTAL) BY MOUTH DAILY - (DOSE INCREASE) 90 tablet 0  . esomeprazole (NEXIUM) 20 MG capsule Take 1 capsule (20 mg total) by mouth daily as needed (take on empty stomach). 90 capsule 1  . FLULAVAL QUADRIVALENT 0.5 ML injection     . fluticasone (FLONASE) 50 MCG/ACT nasal spray Place 2 sprays into both nostrils daily. 16 mL 2  . hydrOXYzine (ATARAX/VISTARIL) 50 MG tablet TAKE 1 TABLET TWICE DAILY AS NEEDED FOR SEVERE ANXIETY ATTACKS 180 tablet 0  . lamoTRIgine (LAMICTAL) 150 MG tablet Take 1 tablet (150 mg total) by mouth daily. 90 tablet 0  . meloxicam (MOBIC) 7.5 MG tablet TAKE 1 TABLET TWICE DAILY AS NEEDED FOR PAIN 180 tablet 0  . rosuvastatin (CRESTOR) 10 MG tablet TAKE 1 TABLET EVERY DAY 90 tablet 3  . traZODone (DESYREL) 100 MG tablet Take 2 tablets (200 mg total) by mouth at bedtime as needed for sleep. 180 tablet 0  . ziprasidone (GEODON) 80 MG capsule TAKE 1 CAPSULE (80 MG TOTAL) BY MOUTH DAILY WITH SUPPER - (DOSE INCREASE) 90 capsule 0   No current facility-administered medications for this visit.     Musculoskeletal: Strength & Muscle Tone: UTA Gait & Station: UTA Patient leans: N/A  Psychiatric Specialty  Exam: Review of Systems  Psychiatric/Behavioral: Positive for decreased concentration,  dysphoric mood, hallucinations and sleep disturbance. The patient is nervous/anxious.   All other systems reviewed and are negative.   There were no vitals taken for this visit.There is no height or weight on file to calculate BMI.  General Appearance: Casual  Eye Contact:  Fair  Speech:  Clear and Coherent  Volume:  Normal  Mood:  Anxious, Depressed, Dysphoric and Irritable  Affect:  Congruent  Thought Process:  Goal Directed and Descriptions of Associations: Intact  Orientation:  Full (Time, Place, and Person)  Thought Content: Delusions and Hallucinations: Auditory paranoid about husband cheating on her, hear noises and sounds  Suicidal Thoughts:  No  Homicidal Thoughts:  No  Memory:  Immediate;   Fair Recent;   Fair Remote;   Poor  Judgement:  Impaired  Insight:  Shallow  Psychomotor Activity:  Normal  Concentration:  Concentration: Fair and Attention Span: Fair  Recall:  [employer] of Knowledge: Fair  Language: Fair  Akathisia:  No  Handed:  Right  AIMS (if indicated): uta  Assets:  Communication Skills Desire for Improvement Housing  ADL's:  Intact  Cognition: Unspecified, impaired  Sleep:  Poor   Screenings: [place] Office Visit from 10/24/2018 in [facility] Office Visit from 04/04/2018 in [facility] Office Visit from 01/03/2018 in [facility] Office Visit from 11/18/2016 in [facility] Total Score 5 0 0 0    PHQ2-9   [place] Video Visit from 12/16/2020 in [facility] Visit from 02/07/2020 in [facility] Visit from 12/19/2019 in [facility] Visit from 10/21/2018 in [facility] Visit from 09/29/2018 in [facility]  PHQ-2 Total Score 5 0 0 4 4  PHQ-9 Total Score 20 - - 21 21       Assessment and Plan: Stacy Chambers is a 55 year old Caucasian female, married, lives in [place], has a history of bipolar disorder, anxiety disorder, COPD, RLS, elevated liver enzymes was evaluated by telemedicine today.  Patient with psychosocial stressors of the current pandemic, relationship struggles.  Patient is currently struggling with depressive symptoms, psychosis and sleep problems.  She will benefit from the following plan.  Plan Bipolar disorder mixed severe-unstable Increase Geodon to 100 mg p.o. daily in divided dosage. Lamictal 150 mg p.o. daily Increase Cogentin to 1 mg p.o. 3 times daily as needed for side effects of Geodon.  Advised patient to limit use, provided medication education. Discontinue Intuniv for side effects and noncompliance. Patient will benefit from psychotherapy sessions, patient educated and encouraged to schedule an appointment with therapist.  Due to her concerns of financial  difficulties provided information for [facility].  Patient advised to let writer know if she has any trouble finding or getting connected with mental health associates.  GAD-unstable Lexapro 10 mg p.o. daily Hydroxyzine 50 mg p.o. twice daily as needed for severe anxiety attacks Patient will benefit from CBT  Insomnia-unstable Increase trazodone to 200 mg p.o. nightly as needed  Attention and focus deficit-unstable She was referred for ADHD testing-encouraged compliance. Patient was also referred to neurologist in the past ,she is noncompliant  Noncompliance with treatment plan-encouraged compliance, provided education.  High risk medication use-will order EKG.  Advised patient to get it done.  Follow-up in clinic in 2 weeks or sooner  if needed.  I have spent atleast 30 minutes face to face by video with patient today. More than 50 % of the time was spent for preparing to see the patient ( e.g., review of test, records ), ordering medications and test ,psychoeducation and  supportive psychotherapy and care coordination,as well as documenting clinical information in electronic health record. This note was generated in part or whole with voice recognition software. Voice recognition is usually quite accurate but there are transcription errors that can and very often do occur. I apologize for any typographical errors that were not detected and corrected.       Ursula Alert, MD 12/16/2020, 8:29 PM

## 2020-12-27 ENCOUNTER — Encounter: Payer: Self-pay | Admitting: Psychiatry

## 2020-12-27 ENCOUNTER — Telehealth (INDEPENDENT_AMBULATORY_CARE_PROVIDER_SITE_OTHER): Payer: Medicare HMO | Admitting: Psychiatry

## 2020-12-27 ENCOUNTER — Telehealth: Payer: Self-pay

## 2020-12-27 ENCOUNTER — Other Ambulatory Visit: Payer: Self-pay

## 2020-12-27 DIAGNOSIS — F411 Generalized anxiety disorder: Secondary | ICD-10-CM

## 2020-12-27 DIAGNOSIS — Z79899 Other long term (current) drug therapy: Secondary | ICD-10-CM

## 2020-12-27 DIAGNOSIS — R41840 Attention and concentration deficit: Secondary | ICD-10-CM

## 2020-12-27 DIAGNOSIS — F3177 Bipolar disorder, in partial remission, most recent episode mixed: Secondary | ICD-10-CM | POA: Diagnosis not present

## 2020-12-27 DIAGNOSIS — F5105 Insomnia due to other mental disorder: Secondary | ICD-10-CM

## 2020-12-27 NOTE — Progress Notes (Signed)
Virtual Visit via Video Note  I connected with Stacy Chambers on 12/27/20 at 11:00 AM EST by a video enabled telemedicine application and verified that I am speaking with the correct person using two identifiers.  Location Provider Location : [facility] Patient Location : Home  Participants: Patient ,Spouse, Provider   I discussed the limitations of evaluation and management by telemedicine and the availability of in person appointments. The patient expressed understanding and agreed to proceed.    I discussed the assessment and treatment plan with the patient. The patient was provided an opportunity to ask questions and all were answered. The patient agreed with the plan and demonstrated an understanding of the instructions.   The patient was advised to call back or seek an in-person evaluation if the symptoms worsen or if the condition fails to improve as anticipated.   [facility] MD OP Progress Note  12/27/2020 12:54 PM Stacy Chambers  MRN:  [id number]  Chief Complaint:  Chief Complaint    Follow-up; Anxiety; Paranoid; Manic Behavior     HPI: Stacy Chambers is a 55 year old Caucasian female, lives in [place], has a history of bipolar disorder, GAD, insomnia, neuroleptic induced akathisia, memory loss, RLS, COPD, chronic pain, abnormal liver function test, is married, unemployed was evaluated by telemedicine today.  Patient was last evaluated on 12/16/2020.  Patient today returns reporting that she has noticed improvement in her mood swings on the higher dosage of Geodon.  She reports she also does not have any preoccupation with paranoia anymore.  She is less suspicious of her husband at this time.Per husband , he has noticed an improvement in her since the past 1 week.  Patient reports sleep as improving.  She reports since being on the higher dosage of Geodon she feels more rested when she wakes up in the morning.  She continues to struggle with concentration, memory problems.  She was  referred for neuropsychological testing however reports she has not scheduled an appointment yet.  Patient also has been noncompliant with EKG.  She reports she never got a call regarding that.  Patient denies any suicidality or homicidality.  Patient reports she is compliant on medications as prescribed.  She denies any side effects to the Geodon and reports she takes the Cogentin once a day as needed.  Patient denies any other concerns today.  Visit Diagnosis:    ICD-10-CM   1. Bipolar disorder, in partial remission, most recent episode mixed (HCC)  F31.77   2. Generalized anxiety disorder  F41.1   3. Insomnia due to mental disorder  F51.05   4. Attention and concentration deficit  R41.840   5. High risk medication use  Z79.899 EKG 12-Lead    Past Psychiatric History: I have reviewed past psychiatric history from my progress note on 10/12/2018.  Past trials of BuSpar, Zoloft, carbamazepine  Past Medical History:  Past Medical History:  Diagnosis Date  . Abnormal blood chemistry 10/03/2015  . Anginal pain ([place])   . Anxiety   . Anxiety, generalized 02/21/2015  . Bipolar affective disorder ([place]) 03/08/2013   Overview:  Last Assessment & Plan:  Continue meds, f/u with psych Restart benzo at 1 po BID prn,    . Bipolar disorder ([place])   . 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 Chronic obstructive pulmonary disease ([place]) 10/03/2015  . COPD (chronic obstructive pulmonary disease) (HCC)    no inhalers  . Elevated liver function tests 11/26/2004   H/O NEGATIVE LIVER BIOPSY, workup negative  .  Gallstones 02/21/2015  . GERD (gastroesophageal reflux disease)   . Hyperlipidemia   . Insomnia   . Lower back pain   . Osteoarthritis   . Ovarian cancer ([place]) 1995  . RLS (restless legs syndrome)   . Sedative, hypnotic or anxiolytic dependence with withdrawal, 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[place])   . Wears dentures    full upper and lower    Past Surgical History:  Procedure Laterality Date  . ABDOMINAL HYSTERECTOMY     . APPENDECTOMY    . BREAST BIOPSY Left 03/12/2015   fat necrosis  . CESAREAN SECTION    . COLONOSCOPY WITH PROPOFOL N/A 05/30/2018   Procedure: COLONOSCOPY WITH PROPOFOL;  Surgeon: Manya Silvas, MD;  Location: [facility] ENDOSCOPY;  Service: Endoscopy;  Laterality: N/A;  . EXCISION MORTON'S NEUROMA Left 10/07/2016   Procedure: EXCISION MORTON'S NEUROMA;  Surgeon: [name], DPM;  Location: [facility];  Service: Podiatry;  Laterality: Left;  IV WITH LOCAL  . LIVER BIOPSY  2005   PerPt: Done to eval elevated LFTs and biopsy provided no definite dx/cause of elevated LFTs  . MULTIPLE TOOTH EXTRACTIONS    . OOPHORECTOMY    . OSTECTOMY Right 04/10/2020   Procedure: DOUBLE OSTEOTOMY GENERAL W/ LOCAL;  Surgeon: [name], DPM;  Location: [facility];  Service: Podiatry;  Laterality: Right;    Family Psychiatric History: I have reviewed family psychiatric history from my progress note on 10/12/2018.  Family History:  Family History  Adopted: Yes  Problem Relation Age of Onset  . COPD Mother   . Depression Mother   . Colon cancer Mother        unsure of age  . Cancer Father        Lung CA, Skin Cancer--?type  . COPD Father   . Stroke Father   . Cancer Sister 58       cervical cancer  . COPD Brother   . Alcohol abuse Brother   . Stroke Brother   . COPD Maternal Aunt   . Depression Daughter   . Schizophrenia Son   . Breast cancer Neg Hx     Social History: I have reviewed social history from my progress note on 10/12/2018. Social History   Socioeconomic History  . Marital status: Married    Spouse name: Not on file  . Number of children: Not on file  . Years of education: Not on file  . Highest education level: Not on file  Occupational History  . Not on file  Tobacco Use  . Smoking status: Former Smoker    Packs/day: 1.00    Years: 30.00    Pack years: 30.00    Types: Cigarettes    Quit date: 04/08/2009    Years since quitting: 11.7  . Smokeless  tobacco: Never Used  Vaping Use  . Vaping Use: Never used  Substance and Sexual Activity  . Alcohol use: Yes    Alcohol/week: 3.0 - 7.0 standard drinks    Types: 3 - 7 Shots of liquor per week    Comment: occasionally  . Drug use: Yes    Types: Marijuana    Comment: told to refrain until after surgery  . Sexual activity: Yes    Birth control/protection: Surgical  Other Topics Concern  . Not on file  Social History Narrative  . Not on file   Social Determinants of Health   Financial Resource Strain: Not on file  Food Insecurity: Not on file  Transportation Needs: Not on file  Physical Activity: Not on file  Stress: Not on file  Social Connections: Not on file    Allergies:  Allergies  Allergen Reactions  . Benadryl [Diphenhydramine Hcl]     Hives/ rapid heartrate  . Codeine     dizziness  . Vicodin [Hydrocodone-Acetaminophen] Other (See Comments)    dizziness    Metabolic Disorder Labs: Lab Results  Component Value Date   HGBA1C 5.6 09/14/2018   MPG 114 09/14/2018   No results found for: PROLACTIN Lab Results  Component Value Date   CHOL 167 06/18/2020   TRIG 165 (H) 06/18/2020   HDL 54 06/18/2020   CHOLHDL 3.1 06/18/2020   VLDL 18 03/04/2015   LDLCALC 87 06/18/2020   LDLCALC 73 12/19/2019   Lab Results  Component Value Date   TSH 1.51 12/19/2019   TSH 2.320 10/24/2018    Therapeutic Level Labs: Lab Results  Component Value Date   LITHIUM 0.3 (L) 10/24/2018   LITHIUM 0.49 (L) 01/13/2009   No results found for: VALPROATE No components found for:  CBMZ  Current Medications: Current Outpatient Medications  Medication Sig Dispense Refill  . alendronate (FOSAMAX) 70 MG tablet TAKE 1 TABLET (70 MG TOTAL) BY MOUTH ONCE A WEEK. TAKE WITH A FULL GLASS OF WATER ON AN EMPTY STOMACH. 12 tablet 3  . ammonium lactate (AMLACTIN) 12 % cream APPLY TOPICALLY AS NEEDED FOR DRY SKIN 385 g 0  . benztropine (COGENTIN) 1 MG tablet Take 1 tablet (1 mg total) by  mouth 2 (two) times daily as needed for tremors. 180 tablet 0  . budesonide-formoterol (SYMBICORT) 160-4.5 MCG/ACT inhaler Inhale 2 puffs into the lungs 2 (two) times daily. 3 each 3  . escitalopram (LEXAPRO) 10 MG tablet TAKE 1 TABLET (10 MG TOTAL) BY MOUTH DAILY - (DOSE INCREASE) 90 tablet 0  . esomeprazole (NEXIUM) 20 MG capsule Take 1 capsule (20 mg total) by mouth daily as needed (take on empty stomach). 90 capsule 1  . FLULAVAL QUADRIVALENT 0.5 ML injection     . fluticasone (FLONASE) 50 MCG/ACT nasal spray Place 2 sprays into both nostrils daily. 16 mL 2  . hydrOXYzine (ATARAX/VISTARIL) 50 MG tablet TAKE 1 TABLET TWICE DAILY AS NEEDED FOR SEVERE ANXIETY ATTACKS 180 tablet 0  . lamoTRIgine (LAMICTAL) 150 MG tablet Take 1 tablet (150 mg total) by mouth daily. 90 tablet 0  . meloxicam (MOBIC) 7.5 MG tablet TAKE 1 TABLET TWICE DAILY AS NEEDED FOR PAIN 180 tablet 0  . rosuvastatin (CRESTOR) 10 MG tablet TAKE 1 TABLET EVERY DAY 90 tablet 3  . traZODone (DESYREL) 100 MG tablet Take 2 tablets (200 mg total) by mouth at bedtime as needed for sleep. 180 tablet 0  . ziprasidone (GEODON) 20 MG capsule Take 1 capsule (20 mg total) by mouth daily with breakfast. 90 capsule 0  . ziprasidone (GEODON) 80 MG capsule TAKE 1 CAPSULE (80 MG TOTAL) BY MOUTH DAILY WITH SUPPER - (DOSE INCREASE) 90 capsule 0   No current facility-administered medications for this visit.     Musculoskeletal: Strength & Muscle Tone: UTA Gait & Station: UTA Patient leans: N/A  Psychiatric Specialty Exam: Review of Systems  Psychiatric/Behavioral: Positive for decreased concentration. The patient is nervous/anxious.   All other systems reviewed and are negative.   There were no vitals taken for this visit.There is no height or weight on file to calculate BMI.  General Appearance: Casual  Eye Contact:  Fair  Speech:  Clear and Coherent  Volume:  Normal  Mood:  Anxious improving  Affect:  Appropriate  Thought Process:   Goal Directed and Descriptions of Associations: Intact  Orientation:  Full (Time, Place, and Person)  Thought Content: Paranoid Ideation improving - not too preoccupied   Suicidal Thoughts:  No  Homicidal Thoughts:  No  Memory:  Immediate;   Fair Recent;   Fair Remote;   Fair  Judgement:  Fair  Insight:  Shallow  Psychomotor Activity:  Normal  Concentration:  Concentration: Fair and Attention Span: Fair  Recall:  [employer] of Knowledge: Fair  Language: Fair  Akathisia:  No  Handed:  Right  AIMS (if indicated): UTA  Assets:  Communication Skills Desire for Improvement Housing Social Support  ADL's:  Intact  Cognition: WNL  Sleep:  Improving   Screenings: [facility] Office Visit from 10/24/2018 in [facility] Office Visit from 04/04/2018 in [facility] Office Visit from 01/03/2018 in [facility] Office Visit from 11/18/2016 in [facility] Total Score 5 0 0 0    PHQ2-9   Flowsheet Row Video Visit from 12/16/2020 in [facility] Office Visit from 02/07/2020 in [facility] Visit from 12/19/2019 in [facility] Visit from 10/21/2018 in [facility] Visit from 09/29/2018 in [facility]  PHQ-2 Total Score 5 0 0 4 4  PHQ-9 Total Score 20 -- -- 21 21       Assessment and Plan: Stacy Chambers is a 55 year old Caucasian female, married, lives in [place], has a history of bipolar disorder, anxiety disorder, COPD, RLS, elevated liver enzymes was evaluated by telemedicine today.  Patient with psychosocial stressors of current pandemic, relationship struggles.  Patient is currently making progress however continues to struggle with attention and focus problems.  She will benefit from the following plan.  Plan Bipolar disorder mixed severe in partial  remission Geodon 100 mg p.o. daily in divided dosage Lamictal 150 mg p.o. daily Cogentin 1 mg p.o. twice daily as needed however she has been taking it only once a day.  Advised patient to limit use and to use it only if she needs it.  GAD-improving Lexapro 10 mg p.o. daily Hydroxyzine 50 mg p.o. twice daily as needed for severe anxiety attacks  Insomnia-improving Trazodone 200 mg p.o. nightly as needed  Attention and focus deficit-unstable Patient has been referred for neuropsychological  testing I have advised staff here to check on the status of the referral.  Patient updated.  High risk medication use-will order EKG again.  Follow-up in clinic in person in 3 to 4 weeks or sooner if needed.  This note was generated in part or whole with voice recognition software. Voice recognition is usually quite accurate but there are transcription errors that can and very often do occur. I apologize for any typographical errors that were not detected and corrected.       Ursula Alert, MD 12/27/2020, 12:54 PM

## 2020-12-27 NOTE — Patient Instructions (Signed)
Please get EKG for further medication refills

## 2020-12-27 NOTE — Telephone Encounter (Signed)
faxed and confirmed ekg orders.   Pt notified

## 2020-12-30 ENCOUNTER — Telehealth: Payer: Self-pay

## 2020-12-30 NOTE — Telephone Encounter (Signed)
pt called states she needs someone in [place] for the adhd testing and she states that she needed someone to go to about her memory issues. Pt states she doesn't go anywhere outside of [place].

## 2020-12-30 NOTE — Telephone Encounter (Signed)
Stacy Billow do we have anyone in [place] ? If not please ask her to contact her health insurance plan.

## 2020-12-30 NOTE — Telephone Encounter (Signed)
Did you also want her to see [facility] clinic neurology?

## 2020-12-31 ENCOUNTER — Other Ambulatory Visit: Payer: Self-pay

## 2020-12-31 ENCOUNTER — Ambulatory Visit
Admission: RE | Admit: 2020-12-31 | Discharge: 2020-12-31 | Disposition: A | Discharge status: Home / Self Care | Payer: Medicare HMO | Source: Ambulatory Visit | Attending: Psychiatry | Admitting: Psychiatry

## 2020-12-31 DIAGNOSIS — Z79899 Other long term (current) drug therapy: Secondary | ICD-10-CM | POA: Insufficient documentation

## 2020-12-31 DIAGNOSIS — Z5181 Encounter for therapeutic drug level monitoring: Secondary | ICD-10-CM | POA: Insufficient documentation

## 2021-01-04 ENCOUNTER — Other Ambulatory Visit: Payer: Self-pay | Admitting: Family Medicine

## 2021-01-25 ENCOUNTER — Other Ambulatory Visit: Payer: Self-pay | Admitting: Psychiatry

## 2021-01-25 DIAGNOSIS — F3178 Bipolar disorder, in full remission, most recent episode mixed: Secondary | ICD-10-CM

## 2021-01-28 ENCOUNTER — Ambulatory Visit (INDEPENDENT_AMBULATORY_CARE_PROVIDER_SITE_OTHER): Payer: Medicare HMO | Admitting: Psychiatry

## 2021-01-28 ENCOUNTER — Encounter: Payer: Self-pay | Admitting: Psychiatry

## 2021-01-28 ENCOUNTER — Other Ambulatory Visit: Payer: Self-pay | Admitting: Family Medicine

## 2021-01-28 ENCOUNTER — Other Ambulatory Visit: Payer: Self-pay

## 2021-01-28 VITALS — BP 119/74 | HR 67 | Temp 97.1°F | Wt 192.0 lb

## 2021-01-28 DIAGNOSIS — R41840 Attention and concentration deficit: Secondary | ICD-10-CM | POA: Diagnosis not present

## 2021-01-28 DIAGNOSIS — F5105 Insomnia due to other mental disorder: Secondary | ICD-10-CM | POA: Diagnosis not present

## 2021-01-28 DIAGNOSIS — F3131 Bipolar disorder, current episode depressed, mild: Secondary | ICD-10-CM

## 2021-01-28 DIAGNOSIS — F411 Generalized anxiety disorder: Secondary | ICD-10-CM

## 2021-01-28 DIAGNOSIS — Z79899 Other long term (current) drug therapy: Secondary | ICD-10-CM

## 2021-01-28 MED ORDER — ESCITALOPRAM OXALATE 10 MG PO TABS: 15.0000 mg | ORAL_TABLET | Freq: Every day | ORAL | 0 refills | Status: DC

## 2021-01-28 NOTE — Patient Instructions (Signed)
[email or web address] [address], [place], [place] [address]

## 2021-01-28 NOTE — Progress Notes (Signed)
[facility] MD OP Progress Note  01/28/2021 1:06 PM Stacy Chambers  MRN:  [id number]  Chief Complaint:  Chief Complaint    Follow-up     HPI: Stacy Chambers is a 55 year old Caucasian female, lives in [place], has a history of bipolar disorder, GAD, insomnia, neuroleptic induced akathisia, memory loss, RLS, COPD, chronic pain, abnormal liver function test, married, unemployed was evaluated by in person visit today.  Patient today reports she is currently struggling with depressive symptoms.  She initially reported that she was struggling with excessive sleepiness during the day.  However later on patient changed her report of symptoms and stated that she struggles with lack of motivation.  She however reports she sleeps around 7 hours at night.  When she wakes up she has to fix her husband's lunch for the day.  She then goes back to bed for a couple of hours and wakes up at around 7 or 8 in the morning.  Patient reports on Mondays she is able to go out shopping to do her groceries.  She reports she also spends time watching TV ,reports she can concentrate when she does that.  She reports she also spends time calling and talking to people on the phone.  She however reports she feels as though she feels unmotivated to do anything else.  She has to push herself to take care of herself, take a shower, doing the cleaning, laundry at home.  She reports she has been having vivid dreams at night since the past few weeks.  She recently had a dream where her son was in an accident however after he was taken to the hospital he completely healed and did not have any injuries when he came home.  She reports this was weird since her son passed away several years ago.  Patient was referred for neuropsychological testing as well as neurology referral in the past for concentration and memory problems.  Patient however today reports she does not want to go to [facility] for neuropsychological testing.  She also reports she  did not get a call from neurology here in [place].  She then changes her statement and reports she currently does not have any memory or concentration problems and she is fine with that.  Patient reports she feels as though she is on too much medications and wants to get rid of some of these medications if possible.  She however could not give any details about whether she is having any side effects to these medications.  Patient denies any suicidality, homicidality.  Patient denies any perceptual disturbances.  Patient was able to complete an MMSE today.  She was also able to complete epworth sleep scale in session today.  Visit Diagnosis:    ICD-10-CM   1. Bipolar 1 disorder, depressed, mild ([place])  F31.31   2. GAD (generalized anxiety disorder)  F41.1 escitalopram (LEXAPRO) 10 MG tablet  3. Insomnia due to mental disorder  F51.05   4. Attention and concentration deficit  R41.840   5. High risk medication use  Z79.899     Past Psychiatric History: I have reviewed past psychiatric history from my progress note on 10/12/2018.  Past trials of BuSpar, Zoloft, carbamazepine  Past Medical History:  Past Medical History:  Diagnosis Date  . Abnormal blood chemistry 10/03/2015  . Anginal pain ([place])   . Anxiety   . Anxiety, generalized 02/21/2015  . Bipolar affective disorder ([place]) 03/08/2013   Overview:  Last Assessment & Plan:  Continue  meds, f/u with psych Restart benzo at 1 po BID prn,    . Bipolar disorder ([place])   . 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 Chronic obstructive pulmonary disease ([place]) 10/03/2015  . COPD (chronic obstructive pulmonary disease) ([facility])    no inhalers  . Elevated liver function tests 11/26/2004   H/O NEGATIVE LIVER BIOPSY, workup negative  . Gallstones 02/21/2015  . GERD (gastroesophageal reflux disease)   . Hyperlipidemia   . Insomnia   . Lower back pain   . Osteoarthritis   . Ovarian cancer ([place]) 1995  . RLS (restless legs syndrome)   . Sedative, hypnotic or  anxiolytic dependence with withdrawal, 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[place])   . Wears dentures    full upper and lower    Past Surgical History:  Procedure Laterality Date  . ABDOMINAL HYSTERECTOMY    . APPENDECTOMY    . BREAST BIOPSY Left 03/12/2015   fat necrosis  . CESAREAN SECTION    . COLONOSCOPY WITH PROPOFOL N/A 05/30/2018   Procedure: COLONOSCOPY WITH PROPOFOL;  Surgeon: Manya Silvas, MD;  [facility] ENDOSCOPY;  Service: Endoscopy;  Laterality: N/A;  . EXCISION MORTON'S NEUROMA Left 10/07/2016   Procedure: EXCISION MORTON'S NEUROMA;  Surgeon: [name], DPM;  Location: [facility];  Service: Podiatry;  Laterality: Left;  IV WITH LOCAL  . LIVER BIOPSY  2005   PerPt: Done to eval elevated LFTs and biopsy provided no definite dx/cause of elevated LFTs  . MULTIPLE TOOTH EXTRACTIONS    . OOPHORECTOMY    . OSTECTOMY Right 04/10/2020   Procedure: DOUBLE OSTEOTOMY GENERAL W/ LOCAL;  Surgeon: [name], DPM;  Location: [facility];  Service: Podiatry;  Laterality: Right;    Family Psychiatric History: I have reviewed family psychiatric history from my progress note on 10/12/2018.  Family History:  Family History  Adopted: Yes  Problem Relation Age of Onset  . COPD Mother   . Depression Mother   . Colon cancer Mother        unsure of age  . Cancer Father        Lung CA, Skin Cancer--?type  . COPD Father   . Stroke Father   . Cancer Sister 28       cervical cancer  . COPD Brother   . Alcohol abuse Brother   . Stroke Brother   . COPD Maternal Aunt   . Depression Daughter   . Schizophrenia Son   . Breast cancer Neg Hx     Social History: Reviewed social history from my progress note on 10/12/2018. Social History   Socioeconomic History  . Marital status: Married    Spouse name: Not on file  . Number of children: Not on file  . Years of education: Not on file  . Highest education level: Not on file  Occupational History  . Not on file  Tobacco Use  .  Smoking status: Former Smoker    Packs/day: 1.00    Years: 30.00    Pack years: 30.00    Types: Cigarettes    Quit date: 04/08/2009    Years since quitting: 11.8  . Smokeless tobacco: Never Used  Vaping Use  . Vaping Use: Never used  Substance and Sexual Activity  . Alcohol use: Yes    Alcohol/week: 3.0 - 7.0 standard drinks    Types: 3 - 7 Shots of liquor per week    Comment: occasionally  . Drug use: Yes    Types: Marijuana  Comment: told to refrain until after surgery  . Sexual activity: Yes    Birth control/protection: Surgical  Other Topics Concern  . Not on file  Social History Narrative  . Not on file   Social Determinants of Health   Financial Resource Strain: Not on file  Food Insecurity: Not on file  Transportation Needs: Not on file  Physical Activity: Not on file  Stress: Not on file  Social Connections: Not on file    Allergies:  Allergies  Allergen Reactions  . Benadryl [Diphenhydramine Hcl]     Hives/ rapid heartrate  . Codeine     dizziness  . Vicodin [Hydrocodone-Acetaminophen] Other (See Comments)    dizziness    Metabolic Disorder Labs: Lab Results  Component Value Date   HGBA1C 5.6 09/14/2018   MPG 114 09/14/2018   No results found for: PROLACTIN Lab Results  Component Value Date   CHOL 167 06/18/2020   TRIG 165 (H) 06/18/2020   HDL 54 06/18/2020   CHOLHDL 3.1 06/18/2020   VLDL 18 03/04/2015   LDLCALC 87 06/18/2020   LDLCALC 73 12/19/2019   Lab Results  Component Value Date   TSH 1.51 12/19/2019   TSH 2.320 10/24/2018    Therapeutic Level Labs: Lab Results  Component Value Date   LITHIUM 0.3 (L) 10/24/2018   LITHIUM 0.49 (L) 01/13/2009   No results found for: VALPROATE No components found for:  CBMZ  Current Medications: Current Outpatient Medications  Medication Sig Dispense Refill  . alendronate (FOSAMAX) 70 MG tablet TAKE 1 TABLET (70 MG TOTAL) BY MOUTH ONCE A WEEK. TAKE WITH A FULL GLASS OF WATER ON AN EMPTY  STOMACH. 12 tablet 3  . ammonium lactate (AMLACTIN) 12 % cream APPLY TOPICALLY AS NEEDED FOR DRY SKIN 385 g 0  . benztropine (COGENTIN) 1 MG tablet Take 1 tablet (1 mg total) by mouth 2 (two) times daily as needed for tremors. 180 tablet 0  . budesonide-formoterol (SYMBICORT) 160-4.5 MCG/ACT inhaler Inhale 2 puffs into the lungs 2 (two) times daily. 3 each 3  . escitalopram (LEXAPRO) 10 MG tablet Take 1.5 tablets (15 mg total) by mouth daily. 135 tablet 0  . esomeprazole (NEXIUM) 20 MG capsule Take 1 capsule (20 mg total) by mouth daily as needed (take on empty stomach). 90 capsule 1  . FLULAVAL QUADRIVALENT 0.5 ML injection     . fluticasone (FLONASE) 50 MCG/ACT nasal spray Place 2 sprays into both nostrils daily. 16 mL 2  . hydrOXYzine (ATARAX/VISTARIL) 50 MG tablet TAKE 1 TABLET TWICE DAILY AS NEEDED FOR SEVERE ANXIETY ATTACKS 180 tablet 0  . lamoTRIgine (LAMICTAL) 150 MG tablet Take 1 tablet (150 mg total) by mouth daily. 90 tablet 0  . meloxicam (MOBIC) 7.5 MG tablet TAKE 1 TABLET TWICE DAILY AS NEEDED FOR PAIN 180 tablet 0  . rosuvastatin (CRESTOR) 10 MG tablet TAKE 1 TABLET EVERY DAY 90 tablet 3  . traZODone (DESYREL) 100 MG tablet Take 2 tablets (200 mg total) by mouth at bedtime as needed for sleep. 180 tablet 0  . ziprasidone (GEODON) 80 MG capsule TAKE 1 CAPSULE EVERY DAY WITH SUPPER  (DOSE  INCREASE) 90 capsule 0   No current facility-administered medications for this visit.     Musculoskeletal: Strength & Muscle Tone: UTA Gait & Station: normal Patient leans: N/A  Psychiatric Specialty Exam: Review of Systems  Psychiatric/Behavioral: Positive for decreased concentration, dysphoric mood and sleep disturbance.  All other systems reviewed and are negative.   Blood pressure  119/74, pulse 67, temperature (!) 97.1 F (36.2 C), temperature source Temporal, weight 192 lb (87.1 kg).Body mass index is 32.96 kg/m.  General Appearance: Casual  Eye Contact:  Fair  Speech:  Clear and  Coherent  Volume:  Normal  Mood:  Depressed  Affect:  Congruent  Thought Process:  Goal Directed and Descriptions of Associations: Intact  Orientation:  Full (Time, Place, and Person)  Thought Content: Logical   Suicidal Thoughts:  No  Homicidal Thoughts:  No  Memory:  Immediate;   Fair Recent;   Fair Remote;   Fair  Judgement:  Fair  Insight:  Shallow  Psychomotor Activity:  UTA  Concentration:  Concentration: Fair and Attention Span: Fair  Recall:  [employer] of Knowledge: Fair  Language: Fair  Akathisia:  No  Handed:  Left  AIMS (if indicated): done  Assets:  Communication Skills Desire for Improvement Housing Social Support  ADL's:  Intact  Cognition: Baseline  Sleep:  Reports sleep as OK , but has vivid dreams   Screenings: [facility] Office Visit from 01/28/2021 in [facility] Visit from 10/24/2018 in [facility] Visit from 04/04/2018 in [facility] [place] Visit from 01/03/2018 in [facility] from 11/18/2016 in [facility] Total Score 0 5 0 0 0    PHQ2-9   [place] Visit from 01/28/2021 in [facility] Video Visit from 12/16/2020 in [facility] from 02/07/2020 in [facility] Visit from 12/19/2019 in [facility] Visit from 10/21/2018 in [facility]  PHQ-2 Total Score 6 5 0 0 4  PHQ-9 Total Score 15 20 -- -- [facility] Office Visit from 01/28/2021 in [facility] No Risk       Assessment and Plan: Stacy Chambers is a 55 year old Caucasian female, married, lives in [place], has a history of bipolar disorder, anxiety disorder, COPD, RLS, elevated liver enzymes was evaluated by telemedicine  today.  Patient with psychosocial stressors of current pandemic, relationship struggles.  Patient does report depressive symptoms and will benefit from the following plan.  Plan Bipolar disorder- Unstable Reduce Geodon to 80 mg p.o. daily.  It is likely the 100 mg is making her too sluggish. Continue Lamictal 150 mg p.o. daily Cogentin 1 mg p.o. twice daily as needed for side effects of Geodon Increase Lexapro to 15 mg p.o. daily  GAD-improving Lexapro as prescribed Hydroxyzine 50 mg p.o. twice daily as needed  Insomnia-restless Patient completed epworth sleep scale-0 Continue trazodone 200 mg p.o. nightly as needed   Memory problems-attention and concentration deficit-unstable Patient was referred for neuropsychological testing as well as neurology referral-pending. MMSE today-27/30  High risk medication use-I have reviewed the EKG manage EHR dated December 31, 2020-normal sinus rhythm.  Will refer patient for CBT-this was discussed with patient several times in the past however she has been noncompliant.  Provided information for [facility] behavioral health services in [place] today since she reports she cannot go to [facility] since that is too far for her to drive.  Provided education.  Encouraged compliance.  Follow-up in clinic in 3 to 4 weeks or sooner if needed.  This note was generated in part or whole with voice recognition software. Voice recognition is usually quite accurate but there are transcription  errors that can and very often do occur. I apologize for any typographical errors that were not detected and corrected.       Ursula Alert, MD 01/28/2021, 1:06 PM

## 2021-02-03 ENCOUNTER — Ambulatory Visit: Payer: Medicare HMO | Admitting: Psychology

## 2021-02-03 ENCOUNTER — Other Ambulatory Visit: Payer: Self-pay | Admitting: Psychiatry

## 2021-02-03 DIAGNOSIS — F411 Generalized anxiety disorder: Secondary | ICD-10-CM

## 2021-02-04 ENCOUNTER — Telehealth: Payer: Self-pay | Admitting: Family Medicine

## 2021-02-04 NOTE — Telephone Encounter (Signed)
Call placed to patient.   Advised that Dr. Dennard Schaumann is unable to accept any new patients at this time.   Verbalized understanding.

## 2021-02-04 NOTE — Telephone Encounter (Signed)
Patient called to request transferring her care to [facility]; patient stated she was declined by the office she applied to due to her age.   Patient wants to schedule her cpe, mammo, and other annual screenings; aware [facility] isn't accepting any new patients or transfer of care patients at this time but she wanted me to ask anyway. Please advise at [phone].

## 2021-02-04 NOTE — Progress Notes (Signed)
  Chronic Care Management   Note  02/04/2021 Name: Stacy Chambers MRN: [id number] DOB: 1966-09-21  Stacy Chambers is a 55 y.o. year old female who is a primary care patient of Henderson, Modena Nunnery, MD. I reached out to Stacy Chambers by phone today in response to a referral sent by Ms. Georgian Co PCP, Alycia Rossetti, MD.   Ms. Brazee was given information about Chronic Care Management services today including:  1. CCM service includes personalized support from designated clinical staff supervised by her physician, including individualized plan of care and coordination with other care providers 2. 24/7 contact phone numbers for assistance for urgent and routine care needs. 3. Service will only be billed when office clinical staff spend 20 minutes or more in a month to coordinate care. 4. Only one practitioner may furnish and bill the service in a calendar month. 5. The patient may stop CCM services at any time (effective at the end of the month) by phone call to the office staff.   Patient agreed to services and verbal consent obtained.   Follow up plan:   Carley Perdue [employer] Scheduler

## 2021-02-06 ENCOUNTER — Other Ambulatory Visit: Payer: Self-pay | Admitting: Psychiatry

## 2021-02-06 DIAGNOSIS — F411 Generalized anxiety disorder: Secondary | ICD-10-CM

## 2021-02-10 ENCOUNTER — Ambulatory Visit: Payer: Medicare HMO

## 2021-02-25 ENCOUNTER — Telehealth (INDEPENDENT_AMBULATORY_CARE_PROVIDER_SITE_OTHER): Payer: Medicare HMO | Admitting: Psychiatry

## 2021-02-25 ENCOUNTER — Other Ambulatory Visit: Payer: Self-pay

## 2021-02-25 ENCOUNTER — Encounter: Payer: Self-pay | Admitting: Psychiatry

## 2021-02-25 DIAGNOSIS — F3131 Bipolar disorder, current episode depressed, mild: Secondary | ICD-10-CM | POA: Diagnosis not present

## 2021-02-25 DIAGNOSIS — F411 Generalized anxiety disorder: Secondary | ICD-10-CM | POA: Diagnosis not present

## 2021-02-25 DIAGNOSIS — R41840 Attention and concentration deficit: Secondary | ICD-10-CM | POA: Diagnosis not present

## 2021-02-25 DIAGNOSIS — F5105 Insomnia due to other mental disorder: Secondary | ICD-10-CM

## 2021-02-25 DIAGNOSIS — Z9111 Patient's noncompliance with dietary regimen: Secondary | ICD-10-CM

## 2021-02-25 MED ORDER — BENZTROPINE MESYLATE 1 MG PO TABS: 1.0000 mg | ORAL_TABLET | Freq: Two times a day (BID) | ORAL | 0 refills | Status: DC | PRN

## 2021-02-25 MED ORDER — LAMOTRIGINE 150 MG PO TABS: 150.0000 mg | ORAL_TABLET | Freq: Every day | ORAL | 0 refills | Status: DC

## 2021-02-25 MED ORDER — TRAZODONE HCL 100 MG PO TABS: 200.0000 mg | ORAL_TABLET | Freq: Every evening | ORAL | 0 refills | Status: DC | PRN

## 2021-02-25 NOTE — Progress Notes (Signed)
Virtual Visit via Video Note  I connected with Stacy Chambers on 02/25/21 at 10:40 AM EDT by a video enabled telemedicine application and verified that I am speaking with the correct person using two identifiers.  Location Provider Location : [facility] Patient Location : Home  Participants: Patient , Provider    I discussed the limitations of evaluation and management by telemedicine and the availability of in person appointments. The patient expressed understanding and agreed to proceed.    I discussed the assessment and treatment plan with the patient. The patient was provided an opportunity to ask questions and all were answered. The patient agreed with the plan and demonstrated an understanding of the instructions.   The patient was advised to call back or seek an in-person evaluation if the symptoms worsen or if the condition fails to improve as anticipated.   [place] MD OP Progress Note  02/26/2021 5:48 PM Stacy Chambers  MRN:  [id number]  Chief Complaint:  Chief Complaint    Follow-up; Anxiety; Depression     HPI: Stacy Chambers is a 55 year old Caucasian female, lives in [place], has a history of bipolar disorder, GAD, insomnia, neuroleptic induced akathisia, memory loss, RLS, COPD, chronic pain, abnormal liver function test, married, unemployed was evaluated by telemedicine today.  Patient appeared to be irritable during the session.  Patient initially reported that she was feeling irritable and does not think her current medications are helpful.  She also reported that she has not been compliant with recommendations for starting psychotherapy sessions.  She reported that she reached out to [facility] however they offer only group counseling.  She is not interested in that.  She went on to say she is also not interested in one-on-one therapy since she does not have that much finances to pay for it on a weekly basis.  She reports that she does not want to go to [facility] for counseling  since that is too far.  Patient later reported that she did not go up on the Lexapro dosage as recommended last visit.  When writer attempted to clarify the reason for not increasing the dosage as recommended, patient stated ' because I am doing well.'  When writer discussed with patient that she had just reported she was irritable and the medications are not working, patient changed what she said and reported , ' I did not take the medication since I chose to.'  Patient also reported she did not want to go into individual counseling and talk about her son who passed away.  Writer attempted to reassure and educate patient, discussed she could request the therapist to address her current symptoms and only talk about what she is interested in talking about.  Patient kept asking Probation officer , ' Are you going to refuse treatment to me ', at which writer responded that we do have a clinic policy for noncompliance.  However discussed with her I am willing to work with her however she will have to follow instructions and try to find a therapist.  Patient however reported ' I am refusing treatment' and hung up on Probation officer.  Patient denied any suicidality, homicidality or perceptual disturbances.    Visit Diagnosis:    ICD-10-CM   1. Bipolar 1 disorder, depressed, mild ([place])  F31.31   2. Generalized anxiety disorder  F41.1 traZODone (DESYREL) 100 MG tablet  3. Insomnia due to mental disorder  F51.05 benztropine (COGENTIN) 1 MG tablet  4. Attention deficit  R41.840 lamoTRIgine (LAMICTAL) 150 MG  tablet  5. Noncompliance with treatment plan  Z91.11     Past Psychiatric History: I have reviewed past psychiatric history from progress note on 10/12/2018.  Past trials of BuSpar, Zoloft, carbamazepine  Past Medical History:  Past Medical History:  Diagnosis Date  . Abnormal blood chemistry 10/03/2015  . Anginal pain ([place])   . Anxiety   . Anxiety, generalized 02/21/2015  . Bipolar affective disorder ([place])  03/08/2013   Overview:  Last Assessment & Plan:  Continue meds, f/u with psych Restart benzo at 1 po BID prn,    . Bipolar disorder ([place])   . 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 Chronic obstructive pulmonary disease ([place]) 10/03/2015  . COPD (chronic obstructive pulmonary disease) ([facility])    no inhalers  . Elevated liver function tests 11/26/2004   H/O NEGATIVE LIVER BIOPSY, workup negative  . Gallstones 02/21/2015  . GERD (gastroesophageal reflux disease)   . Hyperlipidemia   . Insomnia   . Lower back pain   . Osteoarthritis   . Ovarian cancer ([place]) 1995  . RLS (restless legs syndrome)   . Sedative, hypnotic or anxiolytic dependence with withdrawal, 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[place])   . Wears dentures    full upper and lower    Past Surgical History:  Procedure Laterality Date  . ABDOMINAL HYSTERECTOMY    . APPENDECTOMY    . BREAST BIOPSY Left 03/12/2015   fat necrosis  . CESAREAN SECTION    . COLONOSCOPY WITH PROPOFOL N/A 05/30/2018   Procedure: COLONOSCOPY WITH PROPOFOL;  Surgeon: Manya Silvas, MD;  Location[facility] ENDOSCOPY;  Service: Endoscopy;  Laterality: N/A;  . EXCISION MORTON'S NEUROMA Left 10/07/2016   Procedure: EXCISION MORTON'S NEUROMA;  Surgeon: [name], DPM;  Location: [facility];  Service: Podiatry;  Laterality: Left;  IV WITH LOCAL  . LIVER BIOPSY  2005   PerPt: Done to eval elevated LFTs and biopsy provided no definite dx/cause of elevated LFTs  . MULTIPLE TOOTH EXTRACTIONS    . OOPHORECTOMY    . OSTECTOMY Right 04/10/2020   Procedure: DOUBLE OSTEOTOMY GENERAL W/ LOCAL;  Surgeon: [name], DPM;  Location: [facility];  Service: Podiatry;  Laterality: Right;    Family Psychiatric History: I have reviewed family psychiatric history from progress note on 10/12/2018  Family History:  Family History  Adopted: Yes  Problem Relation Age of Onset  . COPD Mother   . Depression Mother   . Colon cancer Mother        unsure of age  . Cancer Father         Lung CA, Skin Cancer--?type  . COPD Father   . Stroke Father   . Cancer Sister 57       cervical cancer  . COPD Brother   . Alcohol abuse Brother   . Stroke Brother   . COPD Maternal Aunt   . Depression Daughter   . Schizophrenia Son   . Breast cancer Neg Hx     Social History: I have reviewed social history from progress note on 10/12/2018 Social History   Socioeconomic History  . Marital status: Married    Spouse name: Not on file  . Number of children: Not on file  . Years of education: Not on file  . Highest education level: Not on file  Occupational History  . Not on file  Tobacco Use  . Smoking status: Former Smoker    Packs/day: 1.00    Years: 30.00    Pack years:  30.00    Types: Cigarettes    Quit date: 04/08/2009    Years since quitting: 11.8  . Smokeless tobacco: Never Used  Vaping Use  . Vaping Use: Never used  Substance and Sexual Activity  . Alcohol use: Yes    Alcohol/week: 3.0 - 7.0 standard drinks    Types: 3 - 7 Shots of liquor per week    Comment: occasionally  . Drug use: Yes    Types: Marijuana    Comment: told to refrain until after surgery  . Sexual activity: Yes    Birth control/protection: Surgical  Other Topics Concern  . Not on file  Social History Narrative  . Not on file   Social Determinants of Health   Financial Resource Strain: Not on file  Food Insecurity: Not on file  Transportation Needs: Not on file  Physical Activity: Not on file  Stress: Not on file  Social Connections: Not on file    Allergies:  Allergies  Allergen Reactions  . Benadryl [Diphenhydramine Hcl]     Hives/ rapid heartrate  . Codeine     dizziness  . Vicodin [Hydrocodone-Acetaminophen] Other (See Comments)    dizziness    Metabolic Disorder Labs: Lab Results  Component Value Date   HGBA1C 5.6 09/14/2018   MPG 114 09/14/2018   No results found for: PROLACTIN Lab Results  Component Value Date   CHOL 167 06/18/2020   TRIG 165 (H) 06/18/2020    HDL 54 06/18/2020   CHOLHDL 3.1 06/18/2020   VLDL 18 03/04/2015   LDLCALC 87 06/18/2020   LDLCALC 73 12/19/2019   Lab Results  Component Value Date   TSH 1.51 12/19/2019   TSH 2.320 10/24/2018    Therapeutic Level Labs: Lab Results  Component Value Date   LITHIUM 0.3 (L) 10/24/2018   LITHIUM 0.49 (L) 01/13/2009   No results found for: VALPROATE No components found for:  CBMZ  Current Medications: Current Outpatient Medications  Medication Sig Dispense Refill  . alendronate (FOSAMAX) 70 MG tablet TAKE 1 TABLET (70 MG TOTAL) BY MOUTH ONCE A WEEK. TAKE WITH A FULL GLASS OF WATER ON AN EMPTY STOMACH. 12 tablet 3  . ammonium lactate (AMLACTIN) 12 % cream APPLY TOPICALLY AS NEEDED FOR DRY SKIN 385 g 0  . benztropine (COGENTIN) 1 MG tablet Take 1 tablet (1 mg total) by mouth 2 (two) times daily as needed for tremors. 180 tablet 0  . budesonide-formoterol (SYMBICORT) 160-4.5 MCG/ACT inhaler Inhale 2 puffs into the lungs 2 (two) times daily. 3 each 3  . escitalopram (LEXAPRO) 10 MG tablet Take 1.5 tablets (15 mg total) by mouth daily. 135 tablet 0  . esomeprazole (NEXIUM) 20 MG capsule Take 1 capsule (20 mg total) by mouth daily as needed (take on empty stomach). 90 capsule 1  . FLULAVAL QUADRIVALENT 0.5 ML injection     . fluticasone (FLONASE) 50 MCG/ACT nasal spray Place 2 sprays into both nostrils daily. 16 mL 2  . hydrOXYzine (ATARAX/VISTARIL) 50 MG tablet TAKE 1 TABLET TWICE DAILY AS NEEDED FOR SEVERE ANXIETY ATTACKS 180 tablet 0  . lamoTRIgine (LAMICTAL) 150 MG tablet Take 1 tablet (150 mg total) by mouth daily. 90 tablet 0  . meloxicam (MOBIC) 7.5 MG tablet TAKE 1 TABLET TWICE DAILY AS NEEDED FOR PAIN 180 tablet 0  . rosuvastatin (CRESTOR) 10 MG tablet TAKE 1 TABLET EVERY DAY 90 tablet 3  . traZODone (DESYREL) 100 MG tablet Take 2 tablets (200 mg total) by mouth at bedtime as  needed for sleep. 180 tablet 0  . ziprasidone (GEODON) 80 MG capsule TAKE 1 CAPSULE EVERY DAY WITH  SUPPER  (DOSE  INCREASE) 90 capsule 0   No current facility-administered medications for this visit.     Musculoskeletal: Strength & Muscle Tone: UTA Gait & Station: UTA Patient leans: N/A  Psychiatric Specialty Exam: Review of Systems  Unable to perform ROS: Psychiatric disorder    There were no vitals taken for this visit.There is no height or weight on file to calculate BMI.  General Appearance: Casual  Eye Contact:  Fair  Speech:  Clear and Coherent  Volume:  Normal  Mood:  Irritable  Affect:  Congruent  Thought Process:  Goal Directed and Descriptions of Associations: Intact  Orientation:  Full (Time, Place, and Person)  Thought Content: Logical   Suicidal Thoughts:  No  Homicidal Thoughts:  No  Memory:  Immediate;   Fair Recent;   Fair Remote;   Fair  Judgement:  Poor  Insight:  Shallow  Psychomotor Activity:  Normal  Concentration:  Concentration: Fair and Attention Span: Fair  Recall:  [employer] of Knowledge: Fair  Language: Fair  Akathisia:  No  Handed:  Left  AIMS (if indicated): UTA  Assets:  Communication Skills Housing Intimacy  ADL's:  Intact  Cognition: WNL  Sleep:  Fair   Screenings: AIMS   Flowsheet Row Office Visit from 01/28/2021 in [facility] Office Visit from 10/24/2018 in [facility] Office Visit from 04/04/2018 in [facility] Office Visit from 01/03/2018 in [facility] Office Visit from 11/18/2016 in [facility] Total Score 0 5 0 0 0    PHQ2-9   [place] Office Visit from 01/28/2021 in [facility] Video Visit from 12/16/2020 in [facility] Office Visit from 02/07/2020 in [facility] Visit from 12/19/2019 in [facility] Visit from 10/21/2018 in [facility]  PHQ-2 Total Score  6 5 0 0 4  PHQ-9 Total Score 15 20 -- -- [place] Office Visit from 01/28/2021 in [facility] No Risk       Assessment and Plan: Stacy Chambers is a 55 year old Caucasian female, married, lives in [place], has a history of bipolar disorder, anxiety disorder, COPD, RLS, elevated liver enzymes was evaluated by telemedicine today.  Patient with psychosocial stressors of the current pandemic, relationship struggles.  Patient continues to report mood symptoms however has been noncompliant with medication recommendations as well as psychotherapy recommendations.  Discussed plan as noted below.  Plan Bipolar disorder-stable Geodon 80 mg p.o. daily-dose reduced last visit. Lamictal 150 mg p.o. daily Cogentin 1 mg p.o. twice daily as needed for side effects of Geodon Lexapro 15 mg p.o. daily- patient has been noncompliant.  GAD-unstable Lexapro was prescribed Hydroxyzine 50 mg p.o. twice daily as needed Discussed with patient to establish care with a therapist.  She was provided information for a therapist in the community last visit and also the previous visits.  Insomnia- stable Trazodone 200 mg p.o. nightly as needed  Memory problems-attention and concentration deficit- unstable Patient was referred to neurologist last visit.  She was also referred for neuropsychological testing in the past however she has been noncompliant. Patient however reports she does have upcoming neurology visit coming up.  Noncompliance with treatment recommendation- patient today in session reported she  wants to refuse treatment.  She is not interested in psychotherapy sessions.  She is also not interested in following up with her health insurance plan for a list of therapists who she can afford in the area.  Patient is also not compliant with medication recommendations.  Patient as noted above, halfway through the  session reported she did not need  treatment and hung up .  We will have Janett Billow CMA send patient a list of resources in the community.  This note was generated in part or whole with voice recognition software. Voice recognition is usually quite accurate but there are transcription errors that can and very often do occur. I apologize for any typographical errors that were not detected and corrected.         Ursula Alert, MD 02/26/2021, 5:48 PM

## 2021-03-01 ENCOUNTER — Other Ambulatory Visit: Payer: Self-pay | Admitting: Psychiatry

## 2021-03-01 DIAGNOSIS — F3162 Bipolar disorder, current episode mixed, moderate: Secondary | ICD-10-CM

## 2021-03-03 ENCOUNTER — Ambulatory Visit: Payer: Medicare HMO | Admitting: Adult Health

## 2021-03-03 ENCOUNTER — Other Ambulatory Visit: Payer: Self-pay | Admitting: Family Medicine

## 2021-03-03 DIAGNOSIS — Z0289 Encounter for other administrative examinations: Secondary | ICD-10-CM

## 2021-03-10 ENCOUNTER — Other Ambulatory Visit: Payer: Self-pay

## 2021-03-10 ENCOUNTER — Ambulatory Visit (INDEPENDENT_AMBULATORY_CARE_PROVIDER_SITE_OTHER): Payer: Medicare HMO | Admitting: Clinical

## 2021-03-10 DIAGNOSIS — F5105 Insomnia due to other mental disorder: Secondary | ICD-10-CM

## 2021-03-10 DIAGNOSIS — F3131 Bipolar disorder, current episode depressed, mild: Secondary | ICD-10-CM | POA: Diagnosis not present

## 2021-03-10 DIAGNOSIS — R41840 Attention and concentration deficit: Secondary | ICD-10-CM | POA: Diagnosis not present

## 2021-03-10 DIAGNOSIS — F411 Generalized anxiety disorder: Secondary | ICD-10-CM
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
## 2021-03-11 ENCOUNTER — Telehealth: Payer: Self-pay | Admitting: Pharmacist

## 2021-03-11 NOTE — Progress Notes (Signed)
Chronic Care Management Pharmacy Note  03/13/2021 Name:  Stacy Chambers MRN:  [id number] DOB:  03-01-1966  Subjective: Stacy Chambers is an 55 y.o. year old female who is a primary patient of Stacy Chambers, Stacy Nunnery, MD.  The CCM team was consulted for assistance with disease management and care coordination needs.    Engaged with patient by telephone for initial visit in response to provider referral for pharmacy case management and/or care coordination services.   Consent to Services:  The patient was given the following information about Chronic Care Management services today, agreed to services, and gave verbal consent: 1. CCM service includes personalized support from designated clinical staff supervised by the primary care provider, including individualized plan of care and coordination with other care providers 2. 24/7 contact phone numbers for assistance for urgent and routine care needs. 3. Service will only be billed when office clinical staff spend 20 minutes or more in a month to coordinate care. 4. Only one practitioner may furnish and bill the service in a calendar month. 5.The patient may stop CCM services at any time (effective at the end of the month) by phone call to the office staff. 6. The patient will be responsible for cost sharing (co-pay) of up to 20% of the service fee (after annual deductible is met). Patient agreed to services and consent obtained.  Patient Care Team: [facility], Stacy Nunnery, MD as PCP - General (Family Medicine) Stacy Chambers, [facility] as Pharmacist (Pharmacist)  Recent office visits: None recently  Recent consult visits: 02/25/21  [facility] Eappen,Saramma, MD. No information available. 01/28/21  [place], MD. No information available. 12/27/20 [place], MD. No information available. 12/16/20  [facility] Eappen,Saramma, MD. No information available. 11/25/20 [facility] Eappen,Saramma, MD. No  information available. 10/28/20 [facility] Eappen,Saramma, MD. No information available. 09/24/20 [facility] Eappen,Saramma, MD. No information available.  Hospital visits: None in previous 6 months  Objective:  Lab Results  Component Value Date   CREATININE 0.78 06/18/2020   BUN 10 06/18/2020   GFRNONAA 66 02/01/2019   GFRAA 76 02/01/2019   NA 140 06/18/2020   K 4.1 06/18/2020   CALCIUM 9.4 06/18/2020   CO2 27 06/18/2020   GLUCOSE 98 06/18/2020    Lab Results  Component Value Date/Time   HGBA1C 5.6 09/14/2018 09:32 AM    Last diabetic Eye exam:  Lab Results  Component Value Date/Time   HMDIABEYEEXA No Retinopathy 07/12/2018 12:00 AM    Last diabetic Foot exam: No results found for: HMDIABFOOTEX   Lab Results  Component Value Date   CHOL 167 06/18/2020   HDL 54 06/18/2020   LDLCALC 87 06/18/2020   TRIG 165 (H) 06/18/2020   CHOLHDL 3.1 06/18/2020    Hepatic Function Latest Ref Rng & Units 06/18/2020 12/19/2019 07/19/2019  Total Protein 6.1 - 8.1 g/dL 6.6 6.7 6.8  Albumin 3.5 - 5.2 g/dL - - -  AST 10 - 35 U/L 26 35 24  ALT 6 - 29 U/L 32(H) 41(H) 27  Alk Phosphatase 39 - 117 U/L - - -  Total Bilirubin 0.2 - 1.2 mg/dL 0.4 0.6 0.4    Lab Results  Component Value Date/Time   TSH 1.51 12/19/2019 08:38 AM   TSH 2.320 10/24/2018 09:06 AM    CBC Latest Ref Rng & Units 06/18/2020 12/19/2019 07/19/2019  WBC 3.8 - 10.8 Thousand/uL 7.5 6.5 7.6  Hemoglobin 11.7 - 15.5 g/dL 12.7 12.8 12.9  Hematocrit 35.0 - 45.0 %  38.7 38.5 38.8  Platelets 140 - 400 Thousand/uL 201 187 226    Lab Results  Component Value Date/Time   VD25OH 26 (L) 06/18/2020 08:23 AM    Clinical ASCVD: No  The 10-year ASCVD risk score Mikey Bussing DC Jr., et al., 2013) is: 1.5%   Values used to calculate the score:     Age: 83 years     Sex: Female     Is Non-Hispanic African American: No     Diabetic: No     Tobacco smoker: No     Systolic Blood Pressure: 536 mmHg     Is BP treated: No      HDL Cholesterol: 54 mg/dL     Total Cholesterol: 167 mg/dL    Depression screen [facility] 2/9 01/28/2021 12/16/2020 02/07/2020  Decreased Interest 3 2 0  Down, Depressed, Hopeless 3 3 0  PHQ - 2 Score 6 5 0  Altered sleeping 3 3 -  Tired, decreased energy 3 3 -  Change in appetite 0 3 -  Feeling bad or failure about yourself  3 3 -  Trouble concentrating 0 1 -  Moving slowly or fidgety/restless 0 2 -  Suicidal thoughts 0 0 -  PHQ-9 Score 15 20 -  Difficult doing work/chores Somewhat difficult Not difficult at all -  Some recent data might be hidden     Social History   Tobacco Use  Smoking Status Former Smoker  . Packs/day: 1.00  . Years: 30.00  . Pack years: 30.00  . Types: Cigarettes  . Quit date: 04/08/2009  . Years since quitting: 11.9  Smokeless Tobacco Never Used   BP Readings from Last 3 Encounters:  01/28/21 119/74  06/18/20 124/78  04/10/20 (!) 125/91   Pulse Readings from Last 3 Encounters:  01/28/21 67  06/18/20 84  04/10/20 77   Wt Readings from Last 3 Encounters:  01/28/21 192 lb (87.1 kg)  06/18/20 184 lb (83.5 kg)  04/10/20 179 lb (81.2 kg)   BMI Readings from Last 3 Encounters:  01/28/21 32.96 kg/m  06/18/20 31.58 kg/m  04/10/20 30.73 kg/m    Assessment/Interventions: Review of patient past medical history, allergies, medications, health status, including review of consultants reports, laboratory and other test data, was performed as part of comprehensive evaluation and provision of chronic care management services.   SDOH:  (Social Determinants of Health) assessments and interventions performed: Yes   Financial Resource Strain: Low Risk   . Difficulty of Paying Living Expenses: Not very hard    SDOH Screenings   Alcohol Screen: Not on file  Depression (PHQ2-9): Medium Risk  . PHQ-2 Score: 15  Financial Resource Strain: Low Risk   . Difficulty of Paying Living Expenses: Not very hard  Food Insecurity: Not on file  Housing: Not on file   Physical Activity: Not on file  Social Connections: Not on file  Stress: Not on file  Tobacco Use: Medium Risk  . Smoking Tobacco Use: Former Smoker  . Smokeless Tobacco Use: Never Used  Transportation Needs: Not on file    CCM Care Plan  Allergies  Allergen Reactions  . Benadryl [Diphenhydramine Hcl]     Hives/ rapid heartrate  . Codeine     dizziness  . Vicodin [Hydrocodone-Acetaminophen] Other (See Comments)    dizziness    Medications Reviewed Today    Reviewed by Stacy Chambers, [facility] (Pharmacist) on 03/13/21 at [place] List Status: <None>  Medication Order Taking? Sig Documenting Provider Last Dose Status  Informant  alendronate (FOSAMAX) 70 MG tablet [id number] Yes TAKE 1 TABLET (70 MG TOTAL) BY MOUTH ONCE A WEEK. TAKE WITH A FULL GLASS OF WATER ON AN EMPTY STOMACH. [place], Stacy Nunnery, MD Taking Active   ammonium lactate (AMLACTIN) 12 % cream [id number] Yes APPLY TOPICALLY AS NEEDED FOR DRY SKIN Susy Frizzle, MD Taking Active   benztropine (COGENTIN) 1 MG tablet [id number] Yes Take 1 tablet (1 mg total) by mouth 2 (two) times daily as needed for tremors. Ursula Alert, MD Taking Active   budesonide-formoterol [facility]) 160-4.5 MCG/ACT inhaler [id number] Yes Inhale 2 puffs into the lungs 2 (two) times daily. [place], Stacy Nunnery, MD Taking Active   escitalopram (LEXAPRO) 10 MG tablet [id number] Yes Take 1.5 tablets (15 mg total) by mouth daily. Ursula Alert, MD Taking Active   esomeprazole (NEXIUM) 20 MG capsule [id number] Yes Take 1 capsule (20 mg total) by mouth daily as needed (take on empty stomach). Delsa Grana, PA-C Taking Active   FLULAVAL QUADRIVALENT 0.5 ML injection [id number] Yes  [provider] Taking Active   fluticasone (FLONASE) 50 MCG/ACT nasal spray [id number] Yes Place 2 sprays into both nostrils daily. Alycia Rossetti, MD Taking Active   hydrOXYzine (ATARAX/VISTARIL) 50 MG tablet [id number] Yes TAKE 1 TABLET TWICE DAILY AS NEEDED FOR SEVERE  ANXIETY ATTACKS Eappen, Saramma, MD Taking Active   lamoTRIgine (LAMICTAL) 150 MG tablet [id number] Yes Take 1 tablet (150 mg total) by mouth daily. Ursula Alert, MD Taking Active   meloxicam (MOBIC) 7.5 MG tablet [id number] Yes TAKE 1 TABLET TWICE DAILY AS NEEDED FOR PAIN Susy Frizzle, MD Taking Active   rosuvastatin (CRESTOR) 10 MG tablet [id number] Yes TAKE 1 TABLET EVERY DAY [place], Stacy Nunnery, MD Taking Active   traZODone (DESYREL) 100 MG tablet [id number] Yes Take 2 tablets (200 mg total) by mouth at bedtime as needed for sleep. Ursula Alert, MD Taking Active   ziprasidone (GEODON) 80 MG capsule [id number] Yes TAKE 1 CAPSULE EVERY DAY WITH SUPPER  (DOSE  INCREASE) Ursula Alert, MD Taking Active           Patient Active Problem List   Diagnosis Date Noted  . Bipolar 1 disorder, depressed, mild ([place]) 02/25/2021  . High risk medication use 12/27/2020  . Bipolar disorder, in partial remission, most recent episode mixed ([place]) 12/16/2020  . Noncompliance with treatment plan 12/16/2020  . Attention and concentration deficit 11/25/2020  . Bipolar 1 disorder, mixed, mild ([place]) 07/12/2020  . Bipolar 1 disorder, mixed, full remission ([place]) 03/04/2020  . Bipolar 1 disorder, mixed, moderate ([place]) 06/07/2019  . Memory loss 06/07/2019  . At risk for long QT syndrome 10/20/2018  . HLD (hyperlipidemia) 10/03/2015  . Osteoporosis 03/04/2015  . Generalized anxiety disorder 02/21/2015  . 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 Bipolar disorder ([place]) 03/08/2013  . COPD (chronic obstructive pulmonary disease) ([place])   . Hyperlipidemia   . Restless legs syndrome (RLS)     Immunization History  Administered Date(s) Administered  . Influenza,inj,Quad PF,6+ Mos 06/26/2014, 06/14/2019, 09/16/2020  . Influenza-Unspecified 09/14/2018  . Tdap 11/22/2013    Conditions to be addressed/monitored:  COPD, Osteopenia, HLD, Bipolar/GAD, Insomnia  Care Plan : General Pharmacy (Adult)   Updates made by Stacy Chambers, RPH since 03/13/2021 12:00 AM    Problem: COPD, Osteoporosis, HLD, Bipolar/GAD, Insomnia   Priority: High  Onset Date: 03/13/2021    Long-Range Goal: Patient-Specific Goal   Start Date: 03/13/2021  Expected End Date: 09/13/2021  This Visit's Progress:  On track  Priority: High  Note:   Current Barriers:  . No specific barriers identified at this visit.  Pharmacist Clinical Goal(s):  Marland Kitchen Patient will maintain control of cholesterol as evidenced by lipid panel  . adhere to prescribed medication regimen as evidenced by fill dates . contact provider office for questions/concerns as evidenced notation of same in electronic health record through collaboration with PharmD and provider.   Interventions: . 1:1 collaboration with [place] Manis, Stacy Nunnery, MD regarding development and update of comprehensive plan of care as evidenced by provider attestation and co-signature . Inter-disciplinary care team collaboration (see longitudinal plan of care) . Comprehensive medication review performed; medication list updated in electronic medical record  Hyperlipidemia: (LDL goal < 100) -Controlled -Current treatment: . Rosuvastatin 60m -Medications previously tried: none noted  -Current exercise habits: patient recently started home workout plan a few days ago, plans to remain consistent with this program moving forward. -Educated on Cholesterol goals;  Benefits of statin for ASCVD risk reduction; Importance of limiting foods high in cholesterol; Exercise goal of 150 minutes per week; Reviewed most recent lipid panel -Recommended to continue current medication Recommended continued physical activity plan.  COPD (Goal: control symptoms and prevent exacerbations) -Controlled -Current treatment  . Symbicort 160-4.5 mcg/act BID -Medications previously tried: Advair, Bevespi, Flovent  -Patient reports consistent use of maintenance inhaler -Frequency of rescue inhaler  use: does not have one -Counseled on Pro[place] inhaler technique; Benefits of consistent maintenance inhaler use Differences between maintenance and rescue inhalers  -Reports breathing has been controlled, with ADLs -Only SOB when doing strenuous activity/exercise -Recommended to continue current medication Counseled on importance of regular exercise in improving breathing status  Depression/Anxiety/Bipolar (Goal: Minimize symptoms) -Controlled -Current treatment: . Escitalopram 133mdaily . Lamotrigine 15043maily - missed doses recently Ziprasidone 50m59mily -Medications previously tried/failed: Wellbutrin, Intuniv -Patient is connected with a counselor for mental health support -Educated on Benefits of medication for symptom control Importance of medication adherence, and to contact providers if she should want to stop a medication or having adverse effects. -Recommended to continue current medication  Osteopenia (Goal: Prevent fractures) -Controlled -Last DEXA Scan: 11/03/2018   T-Score femoral neck: -1.6  T-Score lumbar spine: -2.1  10-year probability of major osteoporotic fracture: 5.8  10-year probability of hip fracture: 0.5% -Current treatment  . Alendronate 70mg58mkly -Medications previously tried: none noted -Counseled on oral bisphosphonate administration: take in the morning, 30 minutes prior to food with 6-8 oz of water. Do not lie down for at least 30 minutes after taking. Recommend weight-bearing and muscle strengthening exercises for building and maintaining bone density. -Recommended to continue current medication Patient has already had one drug holiday, currently on medication approximately 1 year.  Recommend repeat bone density scan to follo wup on efficacy.  Insomnia (Goal: Adequate sleep) -Controlled -Current treatment  . Trazodone 100mg 54m Benztropine 1mg BI24mrn . Hydroxyzine 50mg bi71mn anxiety attacks -Medications previously tried: none  noted -She reports she has slept great as of recently.  She sleeps throughout the night and wakes feelings rested.  -Recommended to continue current medication   Patient Goals/Self-Care Activities . Patient will:  - take medications as prescribed focus on medication adherence by pill box target a minimum of 150 minutes of moderate intensity exercise weekly  Follow Up Plan: The care management team will reach out to the patient again over the next 180 days.         Medication Assistance: None required.  Patient affirms  current coverage meets needs.  Patient's preferred pharmacy is:  [facility], [place] [address] Phone: [phone] Fax: [phone]  [id number] [place] [address] [facility]Phone: [phone]: [phone] Uses pill box? Yes Pt endorses 90% compliance - recently missed doses of Lamictal  We discussed: Benefits of medication synchronization, packaging and delivery as well as enhanced pharmacist oversight with [employer]. Patient decided to: Continue current medication management strategy  Care Plan and Follow Up Patient Decision:  Patient agrees to Care Plan and Follow-up.  Plan: The care management team will reach out to the patient again over the next 180 days.  CBeverly Milch PharmD Clinical Pharmacist [phone]

## 2021-03-11 NOTE — Progress Notes (Addendum)
Chronic Care Management Pharmacy Assistant   Name: Stacy Chambers  MRN: [id number] DOB: 1966/09/26  Stacy Chambers is an 55 y.o. year old female who presents for his initial CCM visit with the clinical pharmacist.  Reason for Encounter: Chart Prep   Conditions to be addressed/monitored: COPD, HLD, Anxiety, Bipolar disorder.  Primary concerns for visit include: COPD.  Recent office visits:  None in the last 6 months  Recent consult visits:  02/25/21  [facility] Eappen,Saramma, MD. No information available. 01/28/21  [place], MD. No information available. 12/27/20 [place], MD. No information available. 12/16/20  [facility] Eappen,Saramma, MD. No information available. 11/25/20 [facility] Eappen,Saramma, MD. No information available. 10/28/20 Behavioral Health Eappen,Saramma, MD. No information available. 09/24/20 Behavioral Health Eappen,Saramma, MD. No information available.  Hospital visits:  None in previous 6 months  Medications: Outpatient Encounter Medications as of 03/11/2021  Medication Sig   alendronate (FOSAMAX) 70 MG tablet TAKE 1 TABLET (70 MG TOTAL) BY MOUTH ONCE A WEEK. TAKE WITH A FULL GLASS OF WATER ON AN EMPTY STOMACH.   ammonium lactate (AMLACTIN) 12 % cream APPLY TOPICALLY AS NEEDED FOR DRY SKIN   benztropine (COGENTIN) 1 MG tablet Take 1 tablet (1 mg total) by mouth 2 (two) times daily as needed for tremors.   budesonide-formoterol (SYMBICORT) 160-4.5 MCG/ACT inhaler Inhale 2 puffs into the lungs 2 (two) times daily.   escitalopram (LEXAPRO) 10 MG tablet Take 1.5 tablets (15 mg total) by mouth daily.   esomeprazole (NEXIUM) 20 MG capsule Take 1 capsule (20 mg total) by mouth daily as needed (take on empty stomach).   FLULAVAL QUADRIVALENT 0.5 ML injection    fluticasone (FLONASE) 50 MCG/ACT nasal spray Place 2 sprays into both nostrils daily.   hydrOXYzine (ATARAX/VISTARIL) 50 MG tablet TAKE 1  TABLET TWICE DAILY AS NEEDED FOR SEVERE ANXIETY ATTACKS   lamoTRIgine (LAMICTAL) 150 MG tablet Take 1 tablet (150 mg total) by mouth daily.   meloxicam (MOBIC) 7.5 MG tablet TAKE 1 TABLET TWICE DAILY AS NEEDED FOR PAIN   rosuvastatin (CRESTOR) 10 MG tablet TAKE 1 TABLET EVERY DAY   traZODone (DESYREL) 100 MG tablet Take 2 tablets (200 mg total) by mouth at bedtime as needed for sleep.   ziprasidone (GEODON) 80 MG capsule TAKE 1 CAPSULE EVERY DAY WITH SUPPER  (DOSE  INCREASE)   No facility-administered encounter medications on file as of 03/11/2021.    Have you seen any other providers since your last visit? Patient stated no.  Any changes in your medications or health? Patient stated no.  Any side effects from any medications? Patient stated physiatrist, forgetfulness.   Do you have an symptoms or problems not managed by your medications? Patient stated no.  Any concerns about your health right now? Patient stated no.   Has your provider asked that you check blood pressure, blood sugar, or follow special diet at home? Patient stated no.  Do you get any type of exercise on a regular basis? Patient stated a workout plan today.   Can you think of a goal you would like to reach for your health? Patient stated to loose the weight.  Do you have any problems getting your medications? Patient stated no.  Is there anything that you would like to discuss during the appointment? Patient stated no.  Please bring medications and supplements to appointment, patient reminded of her OTP appointment on 5/19 at 9 am.   Follow-Up:Pharmacist Review   Stacy Chambers,  Stacy Chambers Clinical Pharmacist Assistant [phone]

## 2021-03-13 ENCOUNTER — Ambulatory Visit (INDEPENDENT_AMBULATORY_CARE_PROVIDER_SITE_OTHER): Payer: Medicare HMO | Admitting: Pharmacist

## 2021-03-13 DIAGNOSIS — E785 Hyperlipidemia, unspecified: Secondary | ICD-10-CM

## 2021-03-13 DIAGNOSIS — J439 Emphysema, unspecified: Secondary | ICD-10-CM | POA: Diagnosis not present

## 2021-03-13 DIAGNOSIS — F319 Bipolar disorder, unspecified: Secondary | ICD-10-CM

## 2021-03-13 DIAGNOSIS — F5105 Insomnia due to other mental disorder: Secondary | ICD-10-CM

## 2021-03-13 DIAGNOSIS — F411 Generalized anxiety disorder: Secondary | ICD-10-CM

## 2021-03-13 NOTE — Patient Instructions (Addendum)
Visit Information  Goals Addressed            This Visit's Progress   . Manage My Medicine       Timeframe:  Long-Range Goal Priority:  High Start Date: 03/13/21                             Expected End Date: 09/13/21                       Follow Up Date 06/25/21    - call for medicine refill 2 or 3 days before it runs out - call if I am sick and can't take my medicine - use a pillbox to sort medicine    Why is this important?   . These steps will help you keep on track with your medicines.   Notes: Use pill box to focus on adherence!!      Patient Care Plan: General Pharmacy (Adult)    Problem Identified: COPD, Osteoporosis, HLD, Bipolar/GAD, Insomnia   Priority: High  Onset Date: 03/13/2021    Long-Range Goal: Patient-Specific Goal   Start Date: 03/13/2021  Expected End Date: 09/13/2021  This Visit's Progress: On track  Priority: High  Note:   Current Barriers:  . No specific barriers identified at this visit.  Pharmacist Clinical Goal(s):  Marland Kitchen Patient will maintain control of cholesterol as evidenced by lipid panel  . adhere to prescribed medication regimen as evidenced by fill dates . contact provider office for questions/concerns as evidenced notation of same in electronic health record through collaboration with PharmD and provider.   Interventions: . 1:1 collaboration with Buelah Manis, Modena Nunnery, MD regarding development and update of comprehensive plan of care as evidenced by provider attestation and co-signature . Inter-disciplinary care team collaboration (see longitudinal plan of care) . Comprehensive medication review performed; medication list updated in electronic medical record  Hyperlipidemia: (LDL goal < 100) -Controlled -Current treatment: . Rosuvastatin 10mg  -Medications previously tried: none noted  -Current exercise habits: patient recently started home workout plan a few days ago, plans to remain consistent with this program moving  forward. -Educated on Cholesterol goals;  Benefits of statin for ASCVD risk reduction; Importance of limiting foods high in cholesterol; Exercise goal of 150 minutes per week; Reviewed most recent lipid panel -Recommended to continue current medication Recommended continued physical activity plan.  COPD (Goal: control symptoms and prevent exacerbations) -Controlled -Current treatment  . Symbicort 160-4.5 mcg/act BID -Medications previously tried: Advair, Bevespi, Flovent  -Patient reports consistent use of maintenance inhaler -Frequency of rescue inhaler use: does not have one -Counseled on Proper inhaler technique; Benefits of consistent maintenance inhaler use Differences between maintenance and rescue inhalers  -Reports breathing has been controlled, with ADLs -Only SOB when doing strenuous activity/exercise -Recommended to continue current medication Counseled on importance of regular exercise in improving breathing status  Depression/Anxiety/Bipolar (Goal: Minimize symptoms) -Controlled -Current treatment: . Escitalopram 15mg  daily . Lamotrigine 150mg  daily - missed doses recently Ziprasidone 80mg  daily -Medications previously tried/failed: Wellbutrin, Intuniv -Patient is connected with a counselor for mental health support -Educated on Benefits of medication for symptom control Importance of medication adherence, and to contact providers if she should want to stop a medication or having adverse effects. -Recommended to continue current medication  Osteopenia (Goal: Prevent fractures) -Controlled -Last DEXA Scan: 11/03/2018   T-Score femoral neck: -1.6  T-Score lumbar spine: -2.1  10-year probability of major osteoporotic fracture:  5.8  10-year probability of hip fracture: 0.5% -Current treatment  . Alendronate 70mg  weekly -Medications previously tried: none noted -Counseled on oral bisphosphonate administration: take in the morning, 30 minutes prior to food with  6-8 oz of water. Do not lie down for at least 30 minutes after taking. Recommend weight-bearing and muscle strengthening exercises for building and maintaining bone density. -Recommended to continue current medication Patient has already had one drug holiday, currently on medication approximately 1 year.  Recommend repeat bone density scan to follo wup on efficacy.  Insomnia (Goal: Adequate sleep) -Controlled -Current treatment  . Trazodone 100mg  hs . Benztropine 1mg  BID prn . Hydroxyzine 50mg  bid prn anxiety attacks -Medications previously tried: none noted -She reports she has slept great as of recently.  She sleeps throughout the night and wakes feelings rested.  -Recommended to continue current medication   Patient Goals/Self-Care Activities . Patient will:  - take medications as prescribed focus on medication adherence by pill box target a minimum of 150 minutes of moderate intensity exercise weekly  Follow Up Plan: The care management team will reach out to the patient again over the next 180 days.        Ms. Chasse was given information about Chronic Care Management services today including:  1. CCM service includes personalized support from designated clinical staff supervised by her physician, including individualized plan of care and coordination with other care providers 2. 24/7 contact phone numbers for assistance for urgent and routine care needs. 3. Standard insurance, coinsurance, copays and deductibles apply for chronic care management only during months in which we provide at least 20 minutes of these services. Most insurances cover these services at 100%, however patients may be responsible for any copay, coinsurance and/or deductible if applicable. This service may help you avoid the need for more expensive face-to-face services. 4. Only one practitioner may furnish and bill the service in a calendar month. 5. The patient may stop CCM services at any time (effective  at the end of the month) by phone call to the office staff.  Patient agreed to services and verbal consent obtained.   The patient verbalized understanding of instructions, educational materials, and care plan provided today and agreed to receive a mailed copy of patient instructions, educational materials, and care plan.  Telephone follow up appointment with pharmacy team member scheduled for:  Edythe Clarity, [facility]  Managing Anxiety, Adult After being diagnosed with an anxiety disorder, you may be relieved to know why you have felt or behaved a certain way. You may also feel overwhelmed about the treatment ahead and what it will mean for your life. With care and support, you can manage this condition and recover from it. How to manage lifestyle changes Managing stress and anxiety Stress is your body's reaction to life changes and events, both good and bad. Most stress will last just a few hours, but stress can be ongoing and can lead to more than just stress. Although stress can play a major role in anxiety, it is not the same as anxiety. Stress is usually caused by something external, such as a deadline, test, or competition. Stress normally passes after the triggering event has ended.  Anxiety is caused by something internal, such as imagining a terrible outcome or worrying that something will go wrong that will devastate you. Anxiety often does not go away even after the triggering event is over, and it can become long-term (chronic) worry. It is important to understand the differences  between stress and anxiety and to manage your stress effectively so that it does not lead to an anxious response. Talk with your health care provider or a counselor to learn more about reducing anxiety and stress. He or she may suggest tension reduction techniques, such as:  Music therapy. This can include creating or listening to music that you enjoy and that inspires you.  Mindfulness-based meditation. This  involves being aware of your normal breaths while not trying to control your breathing. It can be done while sitting or walking.  Centering prayer. This involves focusing on a word, phrase, or sacred image that means something to you and brings you peace.  Deep breathing. To do this, expand your stomach and inhale slowly through your nose. Hold your breath for 3-5 seconds. Then exhale slowly, letting your stomach muscles relax.  Self-talk. This involves identifying thought patterns that lead to anxiety reactions and changing those patterns.  Muscle relaxation. This involves tensing muscles and then relaxing them. Choose a tension reduction technique that suits your lifestyle and personality. These techniques take time and practice. Set aside 5-15 minutes a day to do them. Therapists can offer counseling and training in these techniques. The training to help with anxiety may be covered by some insurance plans. Other things you can do to manage stress and anxiety include:  Keeping a stress/anxiety diary. This can help you learn what triggers your reaction and then learn ways to manage your response.  Thinking about how you react to certain situations. You may not be able to control everything, but you can control your response.  Making time for activities that help you relax and not feeling guilty about spending your time in this way.  Visual imagery and yoga can help you stay calm and relax.   Medicines Medicines can help ease symptoms. Medicines for anxiety include:  Anti-anxiety drugs.  Antidepressants. Medicines are often used as a primary treatment for anxiety disorder. Medicines will be prescribed by a health care provider. When used together, medicines, psychotherapy, and tension reduction techniques may be the most effective treatment. Relationships Relationships can play a big part in helping you recover. Try to spend more time connecting with trusted friends and family members.  Consider going to couples counseling, taking family education classes, or going to family therapy. Therapy can help you and others better understand your condition. How to recognize changes in your anxiety Everyone responds differently to treatment for anxiety. Recovery from anxiety happens when symptoms decrease and stop interfering with your daily activities at home or work. This may mean that you will start to:  Have better concentration and focus. Worry will interfere less in your daily thinking.  Sleep better.  Be less irritable.  Have more energy.  Have improved memory. It is important to recognize when your condition is getting worse. Contact your health care provider if your symptoms interfere with home or work and you feel like your condition is not improving. Follow these instructions at home: Activity  Exercise. Most adults should do the following: ? Exercise for at least 150 minutes each week. The exercise should increase your heart rate and make you sweat (moderate-intensity exercise). ? Strengthening exercises at least twice a week.  Get the right amount and quality of sleep. Most adults need 7-9 hours of sleep each night. Lifestyle  Eat a healthy diet that includes plenty of vegetables, fruits, whole grains, low-fat dairy products, and lean protein. Do not eat a lot of foods that are high  in solid fats, added sugars, or salt.  Make choices that simplify your life.  Do not use any products that contain nicotine or tobacco, such as cigarettes, e-cigarettes, and chewing tobacco. If you need help quitting, ask your health care provider.  Avoid caffeine, alcohol, and certain over-the-counter cold medicines. These may make you feel worse. Ask your pharmacist which medicines to avoid.   General instructions  Take over-the-counter and prescription medicines only as told by your health care provider.  Keep all follow-up visits as told by your health care provider. This is  important. Where to find support You can get help and support from these sources:  Self-help groups.  Online and [employer].  A trusted spiritual leader.  Couples counseling.  Family education classes.  Family therapy. Where to find more information You may find that joining a support group helps you deal with your anxiety. The following sources can help you locate counselors or support groups near you:  [place]: www.mentalhealth[place].net  Anxiety and Depression Association of [place] (ADAA): https://www.clark.net/  National Alliance on Mental Illness (NAMI): www.nami.org Contact a health care provider if you:  Have a hard time staying focused or finishing daily tasks.  Spend many hours a day feeling worried about everyday life.  Become exhausted by worry.  Start to have headaches, feel tense, or have nausea.  Urinate more than normal.  Have diarrhea. Get help right away if you have:  A racing heart and shortness of breath.  Thoughts of hurting yourself or others. If you ever feel like you may hurt yourself or others, or have thoughts about taking your own life, get help right away. You can go to your nearest emergency department or call:  Your local emergency services (911 in the [place]  A suicide crisis helpline, such as the [place] at (762) 530-6653. This is open 24 hours a day. Summary  Taking steps to learn and use tension reduction techniques can help calm you and help prevent triggering an anxiety reaction.  When used together, medicines, psychotherapy, and tension reduction techniques may be the most effective treatment.  Family, friends, and partners can play a big part in helping you recover from an anxiety disorder. This information is not intended to replace advice given to you by your health care provider. Make sure you discuss any questions you have with your health care provider. Document Revised:  03/14/2019 Document Reviewed: 03/14/2019 Elsevier Patient Education  [place].

## 2021-03-14 DIAGNOSIS — G20 Parkinson's disease: Secondary | ICD-10-CM | POA: Diagnosis not present

## 2021-03-14 DIAGNOSIS — M810 Age-related osteoporosis without current pathological fracture: Secondary | ICD-10-CM | POA: Diagnosis not present

## 2021-03-14 DIAGNOSIS — Z0000 Encounter for general adult medical examination without abnormal findings: Secondary | ICD-10-CM | POA: Diagnosis not present

## 2021-03-14 DIAGNOSIS — K219 Gastro-esophageal reflux disease without esophagitis: Secondary | ICD-10-CM | POA: Diagnosis not present

## 2021-03-14 DIAGNOSIS — E785 Hyperlipidemia, unspecified: Secondary | ICD-10-CM | POA: Diagnosis not present

## 2021-03-14 DIAGNOSIS — J449 Chronic obstructive pulmonary disease, unspecified: Secondary | ICD-10-CM | POA: Diagnosis not present

## 2021-03-25 ENCOUNTER — Telehealth: Payer: Self-pay | Admitting: Pharmacist

## 2021-03-25 NOTE — Progress Notes (Addendum)
    Chronic Care Management Pharmacy Assistant   Name: Stacy Chambers  MRN: [id number] DOB: [birth year]  Reason for Encounter: Adherence Review  Medications: Outpatient Encounter Medications as of 03/25/2021  Medication Sig   alendronate (FOSAMAX) 70 MG tablet TAKE 1 TABLET (70 MG TOTAL) BY MOUTH ONCE A WEEK. TAKE WITH A FULL GLASS OF WATER ON AN EMPTY STOMACH.   ammonium lactate (AMLACTIN) 12 % cream APPLY TOPICALLY AS NEEDED FOR DRY SKIN   benztropine (COGENTIN) 1 MG tablet Take 1 tablet (1 mg total) by mouth 2 (two) times daily as needed for tremors.   budesonide-formoterol (SYMBICORT) 160-4.5 MCG/ACT inhaler Inhale 2 puffs into the lungs 2 (two) times daily.   escitalopram (LEXAPRO) 10 MG tablet Take 1.5 tablets (15 mg total) by mouth daily.   esomeprazole (NEXIUM) 20 MG capsule Take 1 capsule (20 mg total) by mouth daily as needed (take on empty stomach).   FLULAVAL QUADRIVALENT 0.5 ML injection    fluticasone (FLONASE) 50 MCG/ACT nasal spray Place 2 sprays into both nostrils daily.   hydrOXYzine (ATARAX/VISTARIL) 50 MG tablet TAKE 1 TABLET TWICE DAILY AS NEEDED FOR SEVERE ANXIETY ATTACKS   lamoTRIgine (LAMICTAL) 150 MG tablet Take 1 tablet (150 mg total) by mouth daily.   meloxicam (MOBIC) 7.5 MG tablet TAKE 1 TABLET TWICE DAILY AS NEEDED FOR PAIN   rosuvastatin (CRESTOR) 10 MG tablet TAKE 1 TABLET EVERY DAY   traZODone (DESYREL) 100 MG tablet Take 2 tablets (200 mg total) by mouth at bedtime as needed for sleep.   ziprasidone (GEODON) 80 MG capsule TAKE 1 CAPSULE EVERY DAY WITH SUPPER  (DOSE  INCREASE)   No facility-administered encounter medications on file as of 03/25/2021.   Reviewed the patients chart for any medical/health and/or medication changes there were not any changes at this time. There were not any recent labs or images taken. Checked to see if patient was on the upstream gap and adherence report for important health concerns and she was not.   Follow-Up:Pharmacist  Review  Charlann Lange, [facility] Pharmacist Assistant [phone]

## 2021-03-28 DIAGNOSIS — Z780 Asymptomatic menopausal state: Secondary | ICD-10-CM | POA: Diagnosis not present

## 2021-03-28 DIAGNOSIS — N951 Menopausal and female climacteric states: Secondary | ICD-10-CM | POA: Diagnosis not present

## 2021-03-28 DIAGNOSIS — M8589 Other specified disorders of bone density and structure, multiple sites: Secondary | ICD-10-CM | POA: Diagnosis not present

## 2021-03-28 DIAGNOSIS — M810 Age-related osteoporosis without current pathological fracture: Secondary | ICD-10-CM | POA: Diagnosis not present

## 2021-04-01 ENCOUNTER — Ambulatory Visit (HOSPITAL_COMMUNITY): Payer: Medicare HMO | Admitting: Clinical

## 2021-04-08 ENCOUNTER — Other Ambulatory Visit: Payer: Self-pay | Admitting: Physician Assistant

## 2021-04-08 DIAGNOSIS — Z1231 Encounter for screening mammogram for malignant neoplasm of breast: Secondary | ICD-10-CM

## 2021-04-08 DIAGNOSIS — Z13820 Encounter for screening for osteoporosis: Secondary | ICD-10-CM

## 2021-04-08 DIAGNOSIS — N6310 Unspecified lump in the right breast, unspecified quadrant: Secondary | ICD-10-CM

## 2021-04-09 ENCOUNTER — Ambulatory Visit (INDEPENDENT_AMBULATORY_CARE_PROVIDER_SITE_OTHER): Payer: Medicare HMO | Admitting: Clinical

## 2021-04-09 ENCOUNTER — Other Ambulatory Visit: Payer: Self-pay | Admitting: Psychiatry

## 2021-04-09 ENCOUNTER — Other Ambulatory Visit: Payer: Self-pay

## 2021-04-09 DIAGNOSIS — F3131 Bipolar disorder, current episode depressed, mild: Secondary | ICD-10-CM | POA: Diagnosis not present

## 2021-04-09 DIAGNOSIS — F411 Generalized anxiety disorder: Secondary | ICD-10-CM

## 2021-04-09 DIAGNOSIS — F5105 Insomnia due to other mental disorder: Secondary | ICD-10-CM

## 2021-04-09 DIAGNOSIS — R41840 Attention and concentration deficit: Secondary | ICD-10-CM

## 2021-04-09 NOTE — Progress Notes (Addendum)
Virtual Visit via Video Note  I connected with Stacy Chambers on 04/09/21 at  8:00 AM EDT by a video enabled telemedicine application and verified that I am speaking with the correct person using two identifiers.  Location: Patient: Home Provider: Office   I discussed the limitations of evaluation and management by telemedicine and the availability of in person appointments. The patient expressed understanding and agreed to proceed.   THERAPIST PROGRESS NOTE   Session Time: 8:00 AM-8:45AM   Participation Level: Active   Behavioral Response: CasualAlertDepressed   Type of Therapy: Individual Therapy   Treatment Goals addressed: Coping   Interventions: CBT   Summary: [name]. Balsley is a 55 y.o. female who presents with Bipolar Disorder./ GAD/Insomnia. The OPT therapist worked with the patient for her initial OPT treatment session. The OPT therapist utilized Motivational Interviewing to assist in creating therapeutic repore. The patient in the session was engaged and work in collaboration giving feedback about her triggers and symptoms over the past few weeks including physical health appointments.The OPT therapist utilized Cognitive Behavioral Therapy through cognitive restructuring as well as worked with the patient on coping strategies to assist in management of mood and as she continues to work on her interactions with others. The patient reviewed her current medication and effectiveness.   Suicidal/Homicidal: Nowithout intent/plan   Therapist Response: The OPT therapist worked with the patient for the patients scheduled session. The patient was engaged in her session and gave feedback in relation to triggers, symptoms, and behavior responses over the past few weeks. The OPT therapist worked with the patient utilizing an in session Cognitive Behavioral Therapy exercise. The patient was responsive in the session and verbalized, " We have family coming and staying this weekend and I  have medical appointments so its going to be busy the next few weeks". The OPT therapist worked with the patient on implementing positive thinking and reviewing upcoming care appointments. The patient spoke about her relationship with her husband and his drinking has caused and her consideration of Divorce. The OPT therapist will continue treatment work with the patient in her next scheduled session   Plan: Return again in 3 weeks.   Diagnosis:      Axis I: Bipolar 1 disorder,depressed mild/GAD/Insomnia/ Attention deficit.                             Axis II: No diagnosis   I discussed the assessment and treatment plan with the patient. The patient was provided an opportunity to ask questions and all were answered. The patient agreed with the plan and demonstrated an understanding of the instructions.   The patient was advised to call back or seek an in-person evaluation if the symptoms worsen or if the condition fails to improve as anticipated.   I provided 45 minutes of non-face-to-face time during this encounter.   [name], LCSW   04/09/2021

## 2021-04-10 DIAGNOSIS — R413 Other amnesia: Secondary | ICD-10-CM | POA: Diagnosis not present

## 2021-04-10 DIAGNOSIS — E538 Deficiency of other specified B group vitamins: Secondary | ICD-10-CM | POA: Diagnosis not present

## 2021-04-10 DIAGNOSIS — F39 Unspecified mood [affective] disorder: Secondary | ICD-10-CM | POA: Diagnosis not present

## 2021-04-10 DIAGNOSIS — Z79899 Other long term (current) drug therapy: Secondary | ICD-10-CM | POA: Diagnosis not present

## 2021-04-11 ENCOUNTER — Other Ambulatory Visit: Payer: Self-pay | Admitting: Psychiatry

## 2021-04-11 DIAGNOSIS — F3178 Bipolar disorder, in full remission, most recent episode mixed: Secondary | ICD-10-CM

## 2021-04-16 ENCOUNTER — Ambulatory Visit
Admission: RE | Admit: 2021-04-16 | Discharge: 2021-04-16 | Disposition: A | Discharge status: Home / Self Care | Payer: Medicare HMO | Source: Ambulatory Visit | Attending: Physician Assistant | Admitting: Physician Assistant

## 2021-04-16 ENCOUNTER — Other Ambulatory Visit: Payer: Self-pay

## 2021-04-16 DIAGNOSIS — M940 Chondrocostal junction syndrome [Tietze]: Secondary | ICD-10-CM | POA: Diagnosis not present

## 2021-04-16 DIAGNOSIS — Z6832 Body mass index (BMI) 32.0-32.9, adult: Secondary | ICD-10-CM | POA: Diagnosis not present

## 2021-04-16 DIAGNOSIS — R600 Localized edema: Secondary | ICD-10-CM | POA: Diagnosis not present

## 2021-04-16 DIAGNOSIS — R922 Inconclusive mammogram: Secondary | ICD-10-CM | POA: Diagnosis not present

## 2021-04-16 DIAGNOSIS — N6001 Solitary cyst of right breast: Secondary | ICD-10-CM | POA: Insufficient documentation

## 2021-04-16 DIAGNOSIS — N6310 Unspecified lump in the right breast, unspecified quadrant: Secondary | ICD-10-CM | POA: Insufficient documentation

## 2021-04-16 DIAGNOSIS — Z1231 Encounter for screening mammogram for malignant neoplasm of breast: Secondary | ICD-10-CM | POA: Insufficient documentation

## 2021-04-16 DIAGNOSIS — R748 Abnormal levels of other serum enzymes: Secondary | ICD-10-CM | POA: Diagnosis not present

## 2021-04-17 DIAGNOSIS — E538 Deficiency of other specified B group vitamins: Secondary | ICD-10-CM | POA: Insufficient documentation

## 2021-04-18 ENCOUNTER — Other Ambulatory Visit: Payer: Self-pay | Admitting: Psychiatry

## 2021-04-18 DIAGNOSIS — F411 Generalized anxiety disorder: Secondary | ICD-10-CM

## 2021-05-02 ENCOUNTER — Other Ambulatory Visit: Payer: Self-pay

## 2021-05-02 ENCOUNTER — Ambulatory Visit (INDEPENDENT_AMBULATORY_CARE_PROVIDER_SITE_OTHER): Payer: Medicare HMO | Admitting: Clinical

## 2021-05-02 DIAGNOSIS — F411 Generalized anxiety disorder: Secondary | ICD-10-CM | POA: Diagnosis not present

## 2021-05-02 DIAGNOSIS — F3131 Bipolar disorder, current episode depressed, mild: Secondary | ICD-10-CM

## 2021-05-02 DIAGNOSIS — R41840 Attention and concentration deficit: Secondary | ICD-10-CM | POA: Diagnosis not present

## 2021-05-02 DIAGNOSIS — F5105 Insomnia due to other mental disorder: Secondary | ICD-10-CM

## 2021-05-02 NOTE — Progress Notes (Signed)
Virtual Visit via Video Note   I connected with Stacy Chambers on 05/02/21 at  8:00 AM EDT by a video enabled telemedicine application and verified that I am speaking with the correct person using two identifiers.   Location: Patient: Home Provider: Office   I discussed the limitations of evaluation and management by telemedicine and the availability of in person appointments. The patient expressed understanding and agreed to proceed.     THERAPIST PROGRESS NOTE   Session Time: 8:00 AM-8:45AM   Participation Level: Active   Behavioral Response: CasualAlertDepressed   Type of Therapy: Individual Therapy   Treatment Goals addressed: Coping   Interventions: CBT   Summary: [name]. Gregg is a 55 y.o. female who presents with Bipolar Disorder./ GAD/Insomnia. The OPT therapist worked with the patient for her initial OPT treatment session. The OPT therapist utilized Motivational Interviewing to assist in creating therapeutic repore. The patient in the session was engaged and work in collaboration giving feedback about her triggers and symptoms over the past few weeks including today being the anniversary of her son passing. The patients husband went to urgent care yesterday for a severe chest cold/ COPD and has been placed on antibiotics and told to follow up with his PCP.The OPT therapist utilized Cognitive Behavioral Therapy through cognitive restructuring as well as worked with the patient on coping strategies to assist in management of mood and as she continues to work on her interactions with others. The patient notes she has concern she injured her ribs during working out a few weeks ago and The doctor gave her some pain medication and noted this will help over time.The patient reviewed her current medication and effectiveness.   Suicidal/Homicidal: Nowithout intent/plan   Therapist Response: The OPT therapist worked with the patient for the patients scheduled session. The patient was  engaged in her session and gave feedback in relation to triggers, symptoms, and behavior responses over the past few weeks. The OPT therapist worked with the patient utilizing an in session Cognitive Behavioral Therapy exercise. The patient was responsive in the session and verbalized, " I cant do my exercises due to my chest problem or anything involving lifting weights, but I can do my cardio". The OPT therapist worked with the patient on implementing positive thinking and reviewing upcoming care appointments. The patient spoke about her relationship with her husband . The OPT therapist will continue treatment work with the patient in her next scheduled session   Plan: Return again in 3 weeks.   Diagnosis:      Axis I: Bipolar 1 disorder,depressed mild/GAD/Insomnia/ Attention deficit.                             Axis II: No diagnosis   I discussed the assessment and treatment plan with the patient. The patient was provided an opportunity to ask questions and all were answered. The patient agreed with the plan and demonstrated an understanding of the instructions.   The patient was advised to call back or seek an in-person evaluation if the symptoms worsen or if the condition fails to improve as anticipated.   I provided 45 minutes of non-face-to-face time during this encounter.   [name], LCSW   05/02/2021

## 2021-05-10 ENCOUNTER — Other Ambulatory Visit: Payer: Self-pay | Admitting: Psychiatry

## 2021-05-10 DIAGNOSIS — F411 Generalized anxiety disorder: Secondary | ICD-10-CM

## 2021-05-10 DIAGNOSIS — R41840 Attention and concentration deficit: Secondary | ICD-10-CM

## 2021-05-16 ENCOUNTER — Other Ambulatory Visit: Payer: Self-pay | Admitting: Psychiatry

## 2021-05-16 ENCOUNTER — Other Ambulatory Visit: Payer: Self-pay

## 2021-05-16 ENCOUNTER — Ambulatory Visit (INDEPENDENT_AMBULATORY_CARE_PROVIDER_SITE_OTHER): Payer: Medicare HMO | Admitting: Clinical

## 2021-05-16 DIAGNOSIS — F5105 Insomnia due to other mental disorder: Secondary | ICD-10-CM

## 2021-05-16 DIAGNOSIS — R41840 Attention and concentration deficit: Secondary | ICD-10-CM | POA: Diagnosis not present

## 2021-05-16 DIAGNOSIS — F3131 Bipolar disorder, current episode depressed, mild: Secondary | ICD-10-CM | POA: Diagnosis not present

## 2021-05-16 DIAGNOSIS — F411 Generalized anxiety disorder: Secondary | ICD-10-CM | POA: Diagnosis not present

## 2021-05-16 NOTE — Progress Notes (Signed)
Virtual Visit via Video Note   I connected with Stacy Chambers on 05/16/21 at  8:00 AM EDT by a video enabled telemedicine application and verified that I am speaking with the correct person using two identifiers.   Location: Patient: Home Provider: Office   I discussed the limitations of evaluation and management by telemedicine and the availability of in person appointments. The patient expressed understanding and agreed to proceed.     THERAPIST PROGRESS NOTE   Session Time: 8:00 AM-8:45AM   Participation Level: Active   Behavioral Response: CasualAlertDepressed   Type of Therapy: Individual Therapy   Treatment Goals addressed: Coping   Interventions: CBT   Summary: Stacy Chambers is a 55 y.o. female who presents with Bipolar Disorder./ GAD/Insomnia. The OPT therapist worked with the patient for her initial OPT treatment session. The OPT therapist utilized Motivational Interviewing to assist in creating therapeutic repore. The patient in the session was engaged and work in collaboration giving feedback about her triggers and symptoms over the past few weeks including concern around her husbands continuing health problem and concern around [place] effecting his COPD . The patient spoke about her husband having a upcoming health appointment.The OPT therapist utilized Cognitive Behavioral Therapy through cognitive restructuring as well as worked with the patient on coping strategies to assist in management of mood and as she continues to work on her interactions with others. The patient notes she is going to have a chest x-ray because she is still having pain from injuring her ribs.The patient reviewed her current medication and effectiveness. The patient identified her husbands health condition is a current stressor and trigger.   Suicidal/Homicidal: Nowithout intent/plan   Therapist Response: The OPT therapist worked with the patient for the patients scheduled session. The patient was  engaged in her session and gave feedback in relation to triggers, symptoms, and behavior responses over the past few weeks. The OPT therapist worked with the patient utilizing an in session Cognitive Behavioral Therapy exercise. The patient was responsive in the session and verbalized, " My husband finally broke down and said he was scared about his health and he never does that he is to [facility] so its serious". The OPT therapist worked with the patient on implementing positive thinking and reviewing upcoming care appointments. . The OPT therapist will continue treatment work with the patient in her next scheduled session   Plan: Return again in 3 weeks.   Diagnosis:      Axis I: Bipolar 1 disorder,depressed mild/GAD/Insomnia/ Attention deficit.                             Axis II: No diagnosis   I discussed the assessment and treatment plan with the patient. The patient was provided an opportunity to ask questions and all were answered. The patient agreed with the plan and demonstrated an understanding of the instructions.   The patient was advised to call back or seek an in-person evaluation if the symptoms worsen or if the condition fails to improve as anticipated.   I provided 45 minutes of non-face-to-face time during this encounter.   [name], LCSW   05/16/2021

## 2021-05-26 ENCOUNTER — Ambulatory Visit: Payer: Medicare HMO | Admitting: Adult Health

## 2021-06-04 ENCOUNTER — Other Ambulatory Visit: Payer: Self-pay

## 2021-06-04 ENCOUNTER — Telehealth (INDEPENDENT_AMBULATORY_CARE_PROVIDER_SITE_OTHER): Payer: Medicare HMO | Admitting: Psychiatry

## 2021-06-04 ENCOUNTER — Encounter: Payer: Self-pay | Admitting: Psychiatry

## 2021-06-04 DIAGNOSIS — R413 Other amnesia: Secondary | ICD-10-CM

## 2021-06-04 DIAGNOSIS — F5105 Insomnia due to other mental disorder: Secondary | ICD-10-CM

## 2021-06-04 DIAGNOSIS — Z634 Disappearance and death of family member: Secondary | ICD-10-CM | POA: Insufficient documentation

## 2021-06-04 DIAGNOSIS — F3131 Bipolar disorder, current episode depressed, mild: Secondary | ICD-10-CM | POA: Diagnosis not present

## 2021-06-04 DIAGNOSIS — F411 Generalized anxiety disorder: Secondary | ICD-10-CM | POA: Diagnosis not present

## 2021-06-04 DIAGNOSIS — Z79899 Other long term (current) drug therapy: Secondary | ICD-10-CM

## 2021-06-04 NOTE — Progress Notes (Signed)
Virtual Visit via Video Note  I connected with Stacy Chambers on 06/04/21 at  8:30 AM EDT by a video enabled telemedicine application and verified that I am speaking with the correct person using two identifiers.  Location Provider Location : [facility] Patient Location : Home  Participants: Patient , Provider    I discussed the limitations of evaluation and management by telemedicine and the availability of in person appointments. The patient expressed understanding and agreed to proceed.   I discussed the assessment and treatment plan with the patient. The patient was provided an opportunity to ask questions and all were answered. The patient agreed with the plan and demonstrated an understanding of the instructions.   The patient was advised to call back or seek an in-person evaluation if the symptoms worsen or if the condition fails to improve as anticipated.    [place] MD OP Progress Note  06/05/2021 9:26 AM Stacy Chambers  MRN:  [id number]  Chief Complaint:  Chief Complaint   Follow-up; Depression; Anxiety    HPI: Stacy Chambers is a 55 year old Caucasian female, lives in [place], has a history of bipolar disorder, GAD, insomnia, neuroleptic induced akathisia, memory loss, RLS, COPD, chronic pain, abnormal liver function tests, unemployed was evaluated by telemedicine today.  Patient today reports she is currently grieving the loss of her husband.  He passed away last week, developed pneumonia and was taken to the emergency department however at that point was diagnosed with cancer, he refused care, was brought back home and he passed away.  Patient became tearful when she discussed her husband's loss.  They were married for 14 years.  She currently has support from her daughter.  She is trying to rent out her home to her roommate.  She reports she has established care with therapist, Mr. Maye Hides and does have upcoming appointment this Friday.  She is motivated to stay in  counseling.  Patient reports she is grieving right now and does have sadness, crying spells, low motivation on and off due to her grief.  She reports sleep is overall okay.  She is compliant on her medications however reports recent medication changes were made by neurologist.  Reviewed notes per Dr.Potter -dated 04/10/2021-patient with memory loss, mood disorder.  Memory evaluation-26 out of 30.  Patient had thyroid levels taken 12/16/2020-levels normal.  Will check Lamictal levels and B12 levels.  Cut Lamictal into half, half in the morning and half in the evening.  Vitamin B12 level noted as low-dated 04/10/2021-this was discussed with patient.  Patient agrees to contact her primary care provider for replacement.  Patient reports she has started taking the Lamictal as discussed by neurologist.  That may have helped to some extent with her memory.  She however continues to struggle on and off.  She denies any suicidality, homicidality or perceptual disturbances.      Visit Diagnosis:    ICD-10-CM   1. Bipolar 1 disorder, depressed, mild ([place])  F31.31     2. Generalized anxiety disorder  F41.1     3. Insomnia due to mental disorder  F51.05     4. Memory loss  R41.3     5. Bereavement  Z63.4     6. High risk medication use  Z79.899 Lamotrigine level      Past Psychiatric History: Reviewed past psychiatric history from progress note on 10/12/2018.  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   Chronic obstructive pulmonary disease ([place]) 10/03/2015   COPD (chronic obstructive pulmonary disease) (HCC)    no  inhalers   Elevated liver function tests 11/26/2004   H/O NEGATIVE LIVER BIOPSY, workup negative   Gallstones 02/21/2015   GERD (gastroesophageal reflux disease)    Hyperlipidemia    Insomnia    Lower back pain    Osteoarthritis    Ovarian cancer ([place]) 1995   RLS (restless legs syndrome)    Sedative, hypnotic or anxiolytic dependence with withdrawal, 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[place])    Wears dentures    full upper and lower    Past Surgical History:  Procedure Laterality Date   ABDOMINAL HYSTERECTOMY     APPENDECTOMY     BREAST BIOPSY Left 03/12/2015   fat necrosis   CESAREAN SECTION     COLONOSCOPY WITH PROPOFOL N/A 05/30/2018   Procedure: COLONOSCOPY WITH PROPOFOL;  Surgeon: Manya Silvas, MD;  Location: [facility];  Service: Endoscopy;  Laterality: N/A;   EXCISION MORTON'S NEUROMA Left 10/07/2016   Procedure: EXCISION MORTON'S NEUROMA;  Surgeon: [name], DPM;  Location: [facility];  Service: Podiatry;  Laterality: Left;  IV WITH LOCAL   LIVER BIOPSY  2005   PerPt: Done to eval elevated LFTs and biopsy provided no definite dx/cause of elevated LFTs   MULTIPLE TOOTH EXTRACTIONS     OOPHORECTOMY     OSTECTOMY Right 04/10/2020   Procedure: DOUBLE OSTEOTOMY GENERAL W/ LOCAL;  Surgeon: [name], DPM;  Location: [facility];  Service: Podiatry;  Laterality: Right;    Family Psychiatric History: Reviewed family psychiatric history from progress note on 10/12/2018  Family History:  Family History  Adopted: Yes  Problem Relation Age of Onset   COPD Mother    Depression Mother    Colon cancer Mother        unsure of age   56 Father        Lung CA, Skin Cancer--?type   COPD Father    Stroke Father    Cancer Sister 73       cervical cancer   COPD Brother    Alcohol abuse Brother    Stroke Brother    COPD Maternal Aunt    Depression Daughter    Schizophrenia Son    Breast cancer Neg Hx     Social History: Reviewed social history from progress note  on 10/12/2018 Social History   Socioeconomic History   Marital status: Married    Spouse name: Not on file   Number of children: Not on file   Years of education: Not on file   Highest education level: Not on file  Occupational History   Not on file  Tobacco Use   Smoking status: Former    Packs/day: 1.00    Years: 30.00    Pack years: 30.00    Types: Cigarettes    Quit date: 04/08/2009    Years since quitting: 12.1   Smokeless tobacco: Never  Vaping Use   Vaping Use: Never used  Substance and Sexual Activity   Alcohol use: Yes    Alcohol/week: 3.0 - 7.0 standard drinks  Types: 3 - 7 Shots of liquor per week    Comment: occasionally   Drug use: Yes    Types: Marijuana    Comment: told to refrain until after surgery   Sexual activity: Yes    Birth control/protection: Surgical  Other Topics Concern   Not on file  Social History Narrative   Not on file   Social Determinants of Health   Financial Resource Strain: Low Risk    Difficulty of Paying Living Expenses: Not very hard  Food Insecurity: Not on file  Transportation Needs: Not on file  Physical Activity: Not on file  Stress: Not on file  Social Connections: Not on file    Allergies:  Allergies  Allergen Reactions   Benadryl [Diphenhydramine Hcl]     Hives/ rapid heartrate   Codeine     dizziness   Vicodin [Hydrocodone-Acetaminophen] Other (See Comments)    dizziness    Metabolic Disorder Labs: Lab Results  Component Value Date   HGBA1C 5.6 09/14/2018   MPG 114 09/14/2018   No results found for: PROLACTIN Lab Results  Component Value Date   CHOL 167 06/18/2020   TRIG 165 (H) 06/18/2020   HDL 54 06/18/2020   CHOLHDL 3.1 06/18/2020   VLDL 18 03/04/2015   LDLCALC 87 06/18/2020   LDLCALC 73 12/19/2019   Lab Results  Component Value Date   TSH 1.51 12/19/2019   TSH 2.320 10/24/2018    Therapeutic Level Labs: Lab Results  Component Value Date   LITHIUM 0.3 (L) 10/24/2018   LITHIUM 0.49  (L) 01/13/2009   No results found for: VALPROATE No components found for:  CBMZ  Current Medications: Current Outpatient Medications  Medication Sig Dispense Refill   alendronate (FOSAMAX) 70 MG tablet TAKE 1 TABLET (70 MG TOTAL) BY MOUTH ONCE A WEEK. TAKE WITH A FULL GLASS OF WATER ON AN EMPTY STOMACH. 12 tablet 3   ammonium lactate (AMLACTIN) 12 % cream APPLY TOPICALLY AS NEEDED FOR DRY SKIN 385 g 0   benztropine (COGENTIN) 1 MG tablet TAKE 1 TABLET (1 MG TOTAL) BY MOUTH 2 (TWO) TIMES DAILY AS NEEDED FOR TREMORS. 180 tablet 0   budesonide-formoterol (SYMBICORT) 160-4.5 MCG/ACT inhaler Inhale 2 puffs into the lungs 2 (two) times daily. 3 each 3   escitalopram (LEXAPRO) 10 MG tablet TAKE 1 AND 1/2 TABLETS EVERY DAY 135 tablet 0   esomeprazole (NEXIUM) 20 MG capsule Take 1 capsule (20 mg total) by mouth daily as needed (take on empty stomach). 90 capsule 1   FLULAVAL QUADRIVALENT 0.5 ML injection      fluticasone (FLONASE) 50 MCG/ACT nasal spray Place 2 sprays into both nostrils daily. 16 mL 2   hydrochlorothiazide (HYDRODIURIL) 12.5 MG tablet      hydrOXYzine (ATARAX/VISTARIL) 50 MG tablet TAKE 1 TABLET TWICE DAILY AS NEEDED FOR SEVERE ANXIETY ATTACKS 180 tablet 0   lamoTRIgine (LAMICTAL) 150 MG tablet TAKE 1 TABLET EVERY DAY 90 tablet 0   meloxicam (MOBIC) 7.5 MG tablet TAKE 1 TABLET TWICE DAILY AS NEEDED FOR PAIN 180 tablet 0   methocarbamol (ROBAXIN) 500 MG tablet      rosuvastatin (CRESTOR) 10 MG tablet TAKE 1 TABLET EVERY DAY 90 tablet 3   traZODone (DESYREL) 100 MG tablet TAKE 2 TABLETS (200 MG TOTAL) BY MOUTH AT BEDTIME AS NEEDED FOR SLEEP. 180 tablet 0   ziprasidone (GEODON) 80 MG capsule TAKE 1 CAPSULE EVERY DAY WITH SUPPER  (DOSE  INCREASE) 90 capsule 0   No current facility-administered  medications for this visit.     Musculoskeletal: Strength & Muscle Tone:  UTA Gait & Station: normal Patient leans: N/A  Psychiatric Specialty Exam: Review of Systems   Psychiatric/Behavioral:  Positive for decreased concentration and dysphoric mood. The patient is nervous/anxious.   All other systems reviewed and are negative.  There were no vitals taken for this visit.There is no height or weight on file to calculate BMI.  General Appearance: Casual  Eye Contact:  Good  Speech:  Clear and Coherent  Volume:  Normal  Mood:  Anxious and Depressed  Affect:  Tearful  Thought Process:  Goal Directed and Descriptions of Associations: Intact  Orientation:  Full (Time, Place, and Person)  Thought Content: Rumination   Suicidal Thoughts:  No  Homicidal Thoughts:  No  Memory:  Immediate;   Fair Recent;   Fair Remote;   Fair  Judgement:  Fair  Insight:  Shallow  Psychomotor Activity:  Normal  Concentration:  Concentration: Fair and Attention Span: Fair  Recall:  [employer] of Knowledge: Fair  Language: Fair  Akathisia:  No  Handed:  Left  AIMS (if indicated): done  Assets:  Communication Skills Desire for Improvement Housing Social Support  ADL's:  Intact  Cognition: WNL  Sleep:  Fair   Screenings: AIMS    Flowsheet Row Video Visit from 06/04/2021 in [facility] Office Visit from 01/28/2021 in [facility] Visit from 10/24/2018 in [facility] from 04/04/2018 in [facility] from 01/03/2018 in [facility] Total Score 0 0 5 0 0      GAD-7    Flowsheet Row Counselor from 03/10/2021 in [facility]  Total GAD-7 Score 12      PHQ2-9    [place] Visit from 01/28/2021 in [facility] Video Visit from 12/16/2020 in [facility] Office Visit from 02/07/2020 in [facility] Visit from 12/19/2019 in [facility] Visit from  10/21/2018 in [facility]  PHQ-2 Total Score 6 5 0 0 4  PHQ-9 Total Score 15 20 -- -- 21      Flowsheet Row Video Visit from 06/04/2021 in [facility] Counselor from 03/10/2021 in [facility] Office Visit from 01/28/2021 in [facility] No Risk No Risk        Assessment and Plan: Stacy Chambers is a 55 year old Caucasian female, widowed, lives in [place], has a history of bipolar disorder, anxiety disorder, COPD, RLS, elevated liver enzymes was evaluated by telemedicine today.  Patient with multiple psychosocial stressors including recent death of her husband a week ago.  Patient will benefit from following plan.  Plan Bipolar disorder-unstable Geodon 80 mg p.o. daily-dose reduced. Change Lamictal to 75 mg p.o. twice daily Cogentin 1 mg p.o. twice daily as needed for side effects of Geodon Restart Lexapro 15 mg p.o. daily-patient has been noncompliant.  GAD-unstable Restart Lexapro 15 mg p.o. daily Hydroxyzine 50 mg p.o. twice daily as needed Patient advised to continue psychotherapy sessions  Insomnia-stable Trazodone 200 mg p.o. nightly as needed  Memory problems-likely multifactorial including medications, vitamin B12 deficiency-unstable Lamictal dosage changed as discussed above Vitamin B12 level reviewed and discussed with patient-dated 04/10/2021-230-low-we will coordinate care with primary care provider.  Patient will need replacement. I have reviewed notes per  neurologist-Dr.Potter as noted above.  Bereavement-unstable Provided grief counseling She will continue psychotherapy sessions with Mr. Marina [place], has upcoming appointment  High risk medication use-will order Lamictal level.  Patient advised to pick up lab slip from the clinic here.  Follow-up in clinic in 3 weeks or sooner if needed.  This note was generated in  part or whole with voice recognition software. Voice recognition is usually quite accurate but there are transcription errors that can and very often do occur. I apologize for any typographical errors that were not detected and corrected.     Ursula Alert, MD 06/05/2021, 9:26 AM

## 2021-06-06 ENCOUNTER — Ambulatory Visit (INDEPENDENT_AMBULATORY_CARE_PROVIDER_SITE_OTHER): Payer: Medicare HMO | Admitting: Clinical

## 2021-06-06 ENCOUNTER — Other Ambulatory Visit: Payer: Self-pay

## 2021-06-06 DIAGNOSIS — F411 Generalized anxiety disorder: Secondary | ICD-10-CM | POA: Diagnosis not present

## 2021-06-06 DIAGNOSIS — R41840 Attention and concentration deficit: Secondary | ICD-10-CM | POA: Diagnosis not present

## 2021-06-06 DIAGNOSIS — F5105 Insomnia due to other mental disorder: Secondary | ICD-10-CM

## 2021-06-06 DIAGNOSIS — F3131 Bipolar disorder, current episode depressed, mild: Secondary | ICD-10-CM | POA: Diagnosis not present

## 2021-06-06 NOTE — Progress Notes (Signed)
Virtual Visit via Telephone Note  I connected with Stacy Chambers on 06/06/21 at  8:00 AM EDT by telephone and verified that I am speaking with the correct person using two identifiers.  Location: Patient: Home Provider: Office   I discussed the limitations, risks, security and privacy concerns of performing an evaluation and management service by telephone and the availability of in person appointments. I also discussed with the patient that there may be a patient responsible charge related to this service. The patient expressed understanding and agreed to proceed.   THERAPIST PROGRESS NOTE   Session Time: 8:00 AM-8:40AM   Participation Level: Active   Behavioral Response: CasualAlertDepressed   Type of Therapy: Individual Therapy   Treatment Goals addressed: Coping   Interventions: CBT   Summary: Stacy Chambers is a 55 y.o. female who presents with Bipolar Disorder./ GAD/Insomnia. The OPT therapist worked with the patient for her initial OPT treatment session. The OPT therapist utilized Motivational Interviewing to assist in creating therapeutic repore. The patient in the session was engaged and work in collaboration giving feedback about her triggers and symptoms over the past few weeks including her husband passing away due to complications from Cancer. The patient spoke about the loss of her husband having a upcoming health appointment.The OPT therapist utilized Cognitive Behavioral Therapy through cognitive restructuring as well as worked with the patient on coping strategies to assist in management of mood and as she continues to work on her interactions with others. The patient notes she is grieving the sudden loss of her husband. And working through readjusting both mentally, financially, and emotionally..The patient reviewed her current medication and effectiveness.    Suicidal/Homicidal: Nowithout intent/plan   Therapist Response: The OPT therapist worked with the patient  for the patients scheduled session. The patient was engaged in her session and gave feedback in relation to triggers, symptoms, and behavior responses over the past few weeks. The OPT therapist worked with the patient utilizing an in session Cognitive Behavioral Therapy exercise. The patient was responsive in the session and verbalized, I just didn't expect him to go that fast". The OPT therapist worked with the patient talking with her about the grieving process and working on implementing positive thinking and reviewing upcoming care appointments. . The OPT therapist will continue treatment work with the patient in her next scheduled session   Plan: Return again in 3 weeks.   Diagnosis:      Axis I: Bipolar 1 disorder,depressed mild/GAD/Insomnia/ Attention deficit.                             Axis II: No diagnosis   I discussed the assessment and treatment plan with the patient. The patient was provided an opportunity to ask questions and all were answered. The patient agreed with the plan and demonstrated an understanding of the instructions.   The patient was advised to call back or seek an in-person evaluation if the symptoms worsen or if the condition fails to improve as anticipated.   I provided 40 minutes of non-face-to-face time during this encounter.   [name], LCSW  06/06/2021

## 2021-06-23 ENCOUNTER — Other Ambulatory Visit: Payer: Self-pay

## 2021-06-23 ENCOUNTER — Ambulatory Visit (INDEPENDENT_AMBULATORY_CARE_PROVIDER_SITE_OTHER): Payer: Medicare HMO | Admitting: Clinical

## 2021-06-23 DIAGNOSIS — F411 Generalized anxiety disorder: Secondary | ICD-10-CM

## 2021-06-23 DIAGNOSIS — R41840 Attention and concentration deficit: Secondary | ICD-10-CM

## 2021-06-23 DIAGNOSIS — F3131 Bipolar disorder, current episode depressed, mild: Secondary | ICD-10-CM | POA: Diagnosis not present

## 2021-06-23 NOTE — Progress Notes (Signed)
Virtual Visit via Telephone Note   I connected with Stacy Chambers on 06/23/21 at  8:00 AM EDT by telephone and verified that I am speaking with the correct person using two identifiers.   Location: Patient: Home Provider: Office   I discussed the limitations, risks, security and privacy concerns of performing an evaluation and management service by telephone and the availability of in person appointments. I also discussed with the patient that there may be a patient responsible charge related to this service. The patient expressed understanding and agreed to proceed.     THERAPIST PROGRESS NOTE   Session Time: 8:00 AM-8:45AM   Participation Level: Active   Behavioral Response: CasualAlertDepressed   Type of Therapy: Individual Therapy   Treatment Goals addressed: Coping   Interventions: CBT   Summary: Stacy Rushing. Chambers is a 55 y.o. female who presents with Bipolar Disorder./ GAD/Insomnia. The OPT therapist worked with the patient for her ongoing OPT treatment session. The OPT therapist utilized Motivational Interviewing to assist in creating therapeutic repore. The patient in the session was engaged and work in collaboration giving feedback about her triggers and symptoms over the past few weeks. The patient spoke about her ongoing adjustment with the recent loss of her husband .The OPT therapist utilized Cognitive Behavioral Therapy through cognitive restructuring as well as worked with the patient on coping strategies to assist in management of mood. The patient continues to work through readjusting both mentally, financially, and emotionally from losing her husband. The OPT therapist worked with the patient on continuing to maintain her basic needs of self care with eating, hygiene, and sleep.  Suicidal/Homicidal: Nowithout intent/plan   Therapist Response: The OPT therapist worked with the patient for the patients scheduled session. The patient was engaged in her session and gave  feedback in relation to triggers, symptoms, and behavior responses over the past few weeks. The OPT therapist worked with the patient utilizing an in session Cognitive Behavioral Therapy exercise. The patient was responsive in the session and verbalized," Its difficult and it seems like now all of sudden things want to break down on me thankfully I have some help form his family but they have to travel from [place]". The OPT therapist worked with the patient talking with her about the grieving process and working on implementing positive thinking, maintaining with basic self care and reviewing upcoming care appointments. The OPT therapist will continue treatment work with the patient in her next scheduled session   Plan: Return again in 3 weeks.   Diagnosis:      Axis I: Bipolar 1 disorder,depressed mild/GAD/Insomnia/ Attention deficit.                             Axis II: No diagnosis   I discussed the assessment and treatment plan with the patient. The patient was provided an opportunity to ask questions and all were answered. The patient agreed with the plan and demonstrated an understanding of the instructions.   The patient was advised to call back or seek an in-person evaluation if the symptoms worsen or if the condition fails to improve as anticipated.   I provided 45 minutes of non-face-to-face time during this encounter.   [name], LCSW  06/23/2021

## 2021-06-25 ENCOUNTER — Other Ambulatory Visit: Payer: Self-pay

## 2021-06-25 ENCOUNTER — Encounter: Payer: Self-pay | Admitting: Psychiatry

## 2021-06-25 ENCOUNTER — Telehealth (INDEPENDENT_AMBULATORY_CARE_PROVIDER_SITE_OTHER): Payer: Medicare HMO | Admitting: Psychiatry

## 2021-06-25 DIAGNOSIS — F5105 Insomnia due to other mental disorder: Secondary | ICD-10-CM

## 2021-06-25 DIAGNOSIS — F411 Generalized anxiety disorder: Secondary | ICD-10-CM | POA: Diagnosis not present

## 2021-06-25 DIAGNOSIS — R413 Other amnesia: Secondary | ICD-10-CM

## 2021-06-25 DIAGNOSIS — F3131 Bipolar disorder, current episode depressed, mild: Secondary | ICD-10-CM

## 2021-06-25 DIAGNOSIS — Z634 Disappearance and death of family member: Secondary | ICD-10-CM | POA: Diagnosis not present

## 2021-06-25 MED ORDER — HYDROXYZINE HCL 50 MG PO TABS: 50.0000 mg | ORAL_TABLET | Freq: Two times a day (BID) | ORAL | 0 refills | Status: DC | PRN

## 2021-06-25 MED ORDER — CLONAZEPAM 0.5 MG PO TABS: 0.5000 mg | ORAL_TABLET | ORAL | 0 refills | Status: DC

## 2021-06-25 MED ORDER — ESCITALOPRAM OXALATE 20 MG PO TABS: 20.0000 mg | ORAL_TABLET | Freq: Every day | ORAL | 0 refills | Status: DC

## 2021-06-25 NOTE — Progress Notes (Signed)
Virtual Visit via Video Note  I connected with Stacy Chambers on 06/25/21 at  9:00 AM EDT by a video enabled telemedicine application and verified that I am speaking with the correct person using two identifiers.  Location Provider Location : [facility] Patient Location : Home  Participants: Patient , Provider   I discussed the limitations of evaluation and management by telemedicine and the availability of in person appointments. The patient expressed understanding and agreed to proceed.    I discussed the assessment and treatment plan with the patient. The patient was provided an opportunity to ask questions and all were answered. The patient agreed with the plan and demonstrated an understanding of the instructions.   The patient was advised to call back or seek an in-person evaluation if the symptoms worsen or if the condition fails to improve as anticipated.   [facility] MD OP Progress Note  06/25/2021 10:03 AM Stacy Chambers  MRN:  [id number]  Chief Complaint:  Chief Complaint   Follow-up; Depression; Anxiety    HPI: Stacy Chambers is a 55 year old Caucasian female, lives in [place], has a history of bipolar disorder, GAD, insomnia, neuroleptic induced akathisia, memory loss, RLS, COPD, chronic pain, abnormal liver function tests, was evaluated by telemedicine today.  Patient continues to grieve the loss of her husband who passed away recently of cancer.  She reports she has been spending a lot of time with her brother who lost his wife to cancer 2 to 3 months ago.  They have been supporting each other.  Patient also had psychotherapy sessions with Mr. Maye Hides recently and reports she has upcoming appointment.  That has been beneficial.  Patient continues to have difficulty staying on task, concentration.  Patient reports feeling anxious, on edge and nervous all the time.  She also has been worrying a lot about different things.  This has been getting worse since the past few  weeks.  Patient is currently more compliant with her medications.  She reports she is currently taking the higher dosage of Lexapro and in spite of that her anxiety continues to be uncontrolled.  She has been using the hydroxyzine 2 times a day as needed and some days 3 times a day.  She was advised to take it up to 2 times a day only.  Patient reports the hydroxyzine is not beneficial and she has been going into panic attacks like episodes, unable to elaborate further.  Patient also reports she has a fear of driving over bridges, heights, fear of storms, Snow, rain which has been going on for a long time.  She denies any suicidality, homicidality or perceptual disturbances.  Patient denies any other concerns today.  Visit Diagnosis:    ICD-10-CM   1. Bipolar 1 disorder, depressed, mild (HCC)  F31.31 escitalopram (LEXAPRO) 20 MG tablet    2. Generalized anxiety disorder  F41.1 escitalopram (LEXAPRO) 20 MG tablet    clonazePAM (KLONOPIN) 0.5 MG tablet    3. Insomnia due to mental disorder  F51.05    mood    4. Memory loss  R41.3     5. Bereavement  Z63.4 hydrOXYzine (ATARAX/VISTARIL) 50 MG tablet      Past Psychiatric History: Reviewed past psychiatric history from progress note on 10/12/2018.  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   Chronic obstructive pulmonary disease ([place]) 10/03/2015   COPD (chronic obstructive pulmonary disease) (HCC)    no inhalers   Elevated liver function tests 11/26/2004   H/O NEGATIVE LIVER BIOPSY, workup negative   Gallstones 02/21/2015   GERD (gastroesophageal reflux disease)     Hyperlipidemia    Insomnia    Lower back pain    Osteoarthritis    Ovarian cancer ([place]) 1995   RLS (restless legs syndrome)    Sedative, hypnotic or anxiolytic dependence with withdrawal, 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[place])    Wears dentures    full upper and lower    Past Surgical History:  Procedure Laterality Date   ABDOMINAL HYSTERECTOMY     APPENDECTOMY     BREAST BIOPSY Left 03/12/2015   fat necrosis   CESAREAN SECTION     COLONOSCOPY WITH PROPOFOL N/A 05/30/2018   Procedure: COLONOSCOPY WITH PROPOFOL;  Surgeon: Manya Silvas, MD;  Location: [facility] END[facility]COPY;  Service: Endoscopy;  Laterality: N/A;   EXCISION MORTON'S NEUROMA Left 10/07/2016   Procedure: EXCISION MORTON'S NEUROMA;  Surgeon: [name], DPM;  Location: [facility];  Service: Podiatry;  Laterality: Left;  IV WITH LOCAL   LIVER BIOPSY  2005   PerPt: Done to eval elevated LFTs and biopsy provided no definite dx/cause of elevated LFTs   MULTIPLE TOOTH EXTRACTIONS     OOPHORECTOMY     OSTECTOMY Right 04/10/2020   Procedure: DOUBLE OSTEOTOMY GENERAL W/ LOCAL;  Surgeon: [name], DPM;  Location: [facility];  Service: Podiatry;  Laterality: Right;    Family Psychiatric History: Reviewed family psychiatric history from progress note on 10/12/2018  Family History:  Family History  Adopted: Yes  Problem Relation Age of Onset   COPD Mother    Depression Mother    Colon cancer Mother        unsure of age   77 Father        Lung CA, Skin Cancer--?type   COPD Father    Stroke Father    Cancer Sister 73       cervical cancer   COPD Brother    Alcohol abuse Brother    Stroke Brother    COPD Maternal Aunt    Depression Daughter    Schizophrenia Son    Breast cancer Neg Hx     Social History: Reviewed social history from progress note on 10/12/2018 Social History   Socioeconomic History   Marital status: Married    Spouse name: Not on file   Number of children: Not on file   Years of  education: Not on file   Highest education level: Not on file  Occupational History   Not on file  Tobacco Use   Smoking status: Former    Packs/day: 1.00    Years: 30.00    Pack years: 30.00    Types: Cigarettes    Quit date: 04/08/2009    Years since quitting: 12.2   Smokeless tobacco: Never  Vaping Use   Vaping Use: Never used  Substance and Sexual Activity   Alcohol use: Yes    Alcohol/week: 3.0 - 7.0 standard drinks    Types: 3 - 7 Shots of liquor per week    Comment: occasionally   Drug use:  Yes    Types: Marijuana    Comment: told to refrain until after surgery   Sexual activity: Yes    Birth control/protection: Surgical  Other Topics Concern   Not on file  Social History Narrative   Not on file   Social Determinants of Health   Financial Resource Strain: Low Risk    Difficulty of Paying Living Expenses: Not very hard  Food Insecurity: Not on file  Transportation Needs: Not on file  Physical Activity: Not on file  Stress: Not on file  Social Connections: Not on file    Allergies:  Allergies  Allergen Reactions   Benadryl [Diphenhydramine Hcl]     Hives/ rapid heartrate   Codeine     dizziness   Vicodin [Hydrocodone-Acetaminophen] Other (See Comments)    dizziness    Metabolic Disorder Labs: Lab Results  Component Value Date   HGBA1C 5.6 09/14/2018   MPG 114 09/14/2018   No results found for: PROLACTIN Lab Results  Component Value Date   CHOL 167 06/18/2020   TRIG 165 (H) 06/18/2020   HDL 54 06/18/2020   CHOLHDL 3.1 06/18/2020   VLDL 18 03/04/2015   LDLCALC 87 06/18/2020   LDLCALC 73 12/19/2019   Lab Results  Component Value Date   TSH 1.51 12/19/2019   TSH 2.320 10/24/2018    Therapeutic Level Labs: Lab Results  Component Value Date   LITHIUM 0.3 (L) 10/24/2018   LITHIUM 0.49 (L) 01/13/2009   No results found for: VALPROATE No components found for:  CBMZ  Current Medications: Current Outpatient Medications  Medication Sig  Dispense Refill   clonazePAM (KLONOPIN) 0.5 MG tablet Take 1 tablet (0.5 mg total) by mouth as directed. Take 1 tablet 2-3 times a week for severe panic attacks only 13 tablet 0   escitalopram (LEXAPRO) 20 MG tablet Take 1 tablet (20 mg total) by mouth daily. 90 tablet 0   alendronate (FOSAMAX) 70 MG tablet TAKE 1 TABLET (70 MG TOTAL) BY MOUTH ONCE A WEEK. TAKE WITH A FULL GLASS OF WATER ON AN EMPTY STOMACH. 12 tablet 3   ammonium lactate (AMLACTIN) 12 % cream APPLY TOPICALLY AS NEEDED FOR DRY SKIN 385 g 0   benztropine (COGENTIN) 1 MG tablet TAKE 1 TABLET (1 MG TOTAL) BY MOUTH 2 (TWO) TIMES DAILY AS NEEDED FOR TREMORS. 180 tablet 0   budesonide-formoterol (SYMBICORT) 160-4.5 MCG/ACT inhaler Inhale 2 puffs into the lungs 2 (two) times daily. 3 each 3   esomeprazole (NEXIUM) 20 MG capsule Take 1 capsule (20 mg total) by mouth daily as needed (take on empty stomach). 90 capsule 1   FLULAVAL QUADRIVALENT 0.5 ML injection      fluticasone (FLONASE) 50 MCG/ACT nasal spray Place 2 sprays into both nostrils daily. 16 mL 2   hydrochlorothiazide (HYDRODIURIL) 12.5 MG tablet      hydrOXYzine (ATARAX/VISTARIL) 50 MG tablet Take 1 tablet (50 mg total) by mouth 2 (two) times daily as needed. 180 tablet 0   lamoTRIgine (LAMICTAL) 150 MG tablet TAKE 1 TABLET EVERY DAY 90 tablet 0   meloxicam (MOBIC) 7.5 MG tablet TAKE 1 TABLET TWICE DAILY AS NEEDED FOR PAIN 180 tablet 0   methocarbamol (ROBAXIN) 500 MG tablet      rosuvastatin (CRESTOR) 10 MG tablet TAKE 1 TABLET EVERY DAY 90 tablet 3   traZODone (DESYREL) 100 MG tablet TAKE 2 TABLETS (200 MG TOTAL) BY MOUTH AT BEDTIME AS NEEDED FOR SLEEP. 180 tablet 0   ziprasidone (GEODON) 80 MG capsule  TAKE 1 CAPSULE EVERY DAY WITH SUPPER  (DOSE  INCREASE) 90 capsule 0   No current facility-administered medications for this visit.     Musculoskeletal: Strength & Muscle Tone:  UTA Gait & Station: normal Patient leans: N/A  Psychiatric Specialty Exam: Review of  Systems  Psychiatric/Behavioral:  Positive for decreased concentration and dysphoric mood. The patient is nervous/anxious.   All other systems reviewed and are negative.  There were no vitals taken for this visit.There is no height or weight on file to calculate BMI.  General Appearance: Casual  Eye Contact:  Good  Speech:  Clear and Coherent  Volume:  Normal  Mood:  Anxious and Dysphoric  Affect:  Congruent  Thought Process:  Goal Directed and Descriptions of Associations: Intact  Orientation:  Full (Time, Place, and Person)  Thought Content: Logical   Suicidal Thoughts:  No  Homicidal Thoughts:  No  Memory:  Immediate;   Fair Recent;   Fair Remote;   Fair  Judgement:  Fair  Insight:  Fair  Psychomotor Activity:  Normal  Concentration:  Concentration: Fair and Attention Span: Fair  Recall:  [employer] of Knowledge: Fair  Language: Fair  Akathisia:  No  Handed:  Left  AIMS (if indicated): done  Assets:  Communication Skills Desire for Improvement Housing Social Support  ADL's:  Intact  Cognition: WNL  Sleep:  Fair   Screenings: AIMS    Flowsheet Row Video Visit from 06/04/2021 in [facility] Office Visit from 01/28/2021 in [facility] Visit from 10/24/2018 in [facility] Visit from 04/04/2018 in [facility] Visit from 01/03/2018 in [facility] Total Score 0 0 5 0 0      GAD-7    Flowsheet Row Video Visit from 06/25/2021 in [facility] from 03/10/2021 in [facility]-  Total GAD-7 Score 17 12      PHQ2-9    [facility] Visit from 01/28/2021 in [facility] Video Visit from 12/16/2020 in [facility] Office Visit from 02/07/2020 in [facility] Visit from 12/19/2019 in [facility] Visit from 10/21/2018 in [facility]  PHQ-2 Total Score 6 5 0 0 4  PHQ-9 Total Score 15 20 -- -- 21      Flowsheet Row Video Visit from 06/04/2021 in [facility] Counselor from 03/10/2021 in [facility] Office Visit from 01/28/2021 in [facility] No Risk No Risk        Assessment and Plan: Stacy Chambers is a 55 year old Caucasian female, widowed, lives in [place], has a history of bipolar disorder, anxiety disorder, COPD, RLS, elevated liver enzymes was evaluated by telemedicine today.  Patient with multiple psychosocial stressors including recent death of her husband, continues to struggle with mood.  She will benefit from the following plan.  Plan Bipolar disorder-unstable Geodon 80 mg p.o. daily-reduced dosage due to side effects. Lamictal 75 mg p.o. twice daily Cogentin 1 mg p.o. twice daily as needed for side effects of Geodon Increase Lexapro to 20 mg p.o. daily.  Patient is more compliant AIMS - 0  GAD-unstable Start Klonopin 0.5 mg up to 2-3 times a week only for severe panic attacks.  Provided medication education, advised patient to limit use. Increase Lexapro to 20 mg p.o.  daily She could continue hydroxyzine 50 mg p.o. twice daily as needed if it helps. Patient advised to continue psychotherapy sessions with Mr. Maye Hides  Insomnia-stable Trazodone 200 mg p.o. nightly as needed  Bereavement-unstable Continue grief/psychotherapy with Mr. Maye Hides.  Memory problems-likely multifactorial including medications, vitamin B12 deficiency-unstable Patient was advised to get Lamictal level-pending.  She has been noncompliant,encouraged to pick up labs lip at front desk. Patient to continue vitamin B12 replacement-continue follow-up with neurology as  needed.  High risk medication use-Lamictal level pending.  Encouraged compliance  Follow-up in clinic in 2 weeks or sooner if needed.  This note was generated in part or whole with voice recognition software. Voice recognition is usually quite accurate but there are transcription errors that can and very often do occur. I apologize for any typographical errors that were not detected and corrected.      Ursula Alert, MD 06/26/2021, 8:39 AM

## 2021-06-26 ENCOUNTER — Other Ambulatory Visit
Admission: RE | Admit: 2021-06-26 | Discharge: 2021-06-26 | Disposition: A | Discharge status: Home / Self Care | Payer: Medicare HMO | Attending: Psychiatry | Admitting: Psychiatry

## 2021-06-26 DIAGNOSIS — Z79899 Other long term (current) drug therapy: Secondary | ICD-10-CM | POA: Insufficient documentation

## 2021-06-27 LAB — LAMOTRIGINE LEVEL: Lamotrigine Lvl: 1.4 ug/mL — ABNORMAL LOW (ref 2.0–20.0)

## 2021-07-14 ENCOUNTER — Ambulatory Visit (INDEPENDENT_AMBULATORY_CARE_PROVIDER_SITE_OTHER): Payer: Medicare HMO | Admitting: Clinical

## 2021-07-14 ENCOUNTER — Other Ambulatory Visit: Payer: Self-pay

## 2021-07-14 ENCOUNTER — Encounter: Payer: Self-pay | Admitting: Psychiatry

## 2021-07-14 ENCOUNTER — Telehealth (INDEPENDENT_AMBULATORY_CARE_PROVIDER_SITE_OTHER): Payer: Medicare HMO | Admitting: Psychiatry

## 2021-07-14 DIAGNOSIS — F411 Generalized anxiety disorder: Secondary | ICD-10-CM

## 2021-07-14 DIAGNOSIS — Z634 Disappearance and death of family member: Secondary | ICD-10-CM

## 2021-07-14 DIAGNOSIS — R41840 Attention and concentration deficit: Secondary | ICD-10-CM | POA: Diagnosis not present

## 2021-07-14 DIAGNOSIS — F5105 Insomnia due to other mental disorder: Secondary | ICD-10-CM

## 2021-07-14 DIAGNOSIS — R413 Other amnesia: Secondary | ICD-10-CM | POA: Diagnosis not present

## 2021-07-14 DIAGNOSIS — F3131 Bipolar disorder, current episode depressed, mild: Secondary | ICD-10-CM | POA: Diagnosis not present

## 2021-07-14 MED ORDER — ZIPRASIDONE HCL 80 MG PO CAPS: ORAL_CAPSULE | ORAL | 0 refills | Status: DC

## 2021-07-14 MED ORDER — LAMOTRIGINE 150 MG PO TABS: 75.0000 mg | ORAL_TABLET | Freq: Two times a day (BID) | ORAL | 0 refills | Status: DC

## 2021-07-14 NOTE — Progress Notes (Signed)
Virtual Visit via Video Note  I connected with Stacy Chambers on 07/14/21 at  8:00 AM EDT by a video enabled telemedicine application and verified that I am speaking with the correct person using two identifiers.  Location: Patient: Home Provider: Office   I discussed the limitations of evaluation and management by telemedicine and the availability of in person appointments. The patient expressed understanding and agreed to proceed.  THERAPIST PROGRESS NOTE   Session Time: 8:00 AM-8:30AM   Participation Level: Active   Behavioral Response: CasualAlertDepressed   Type of Therapy: Individual Therapy   Treatment Goals addressed: Coping   Interventions: CBT   Summary: Stacy Rushing. Chambers is a 55 y.o. female who presents with Bipolar Disorder./ GAD/Insomnia. The OPT therapist worked with the patient for her ongoing OPT treatment session. The OPT therapist utilized Motivational Interviewing to assist in creating therapeutic repore. The patient in the session was engaged and work in collaboration giving feedback about her triggers and symptoms over the past few weeks. The patient spoke about her ongoing adjustment with the recent loss of her husband . The patient spoke about getting sick/nausea waking up in the middle of the night. And getting sick. The patient spoke about getting new pets fish and a puppy.The OPT therapist utilized Cognitive Behavioral Therapy through cognitive restructuring as well as worked with the patient on coping strategies to assist in management of mood. The patient continues to work through readjusting both mentally, financially, and emotionally from losing her husband. The patient spoke about getting the death certificate allowing her to move forward with legal processes connected with her husband passing away.The OPT therapist worked with the patient on continuing to maintain her basic needs of self care with eating, hygiene, and sleep. The patient spoke about spending  more time with her brother on a daily basis.   Suicidal/Homicidal: Nowithout intent/plan   Therapist Response: The OPT therapist worked with the patient for the patients scheduled session. The patient was engaged in her session and gave feedback in relation to triggers, symptoms, and behavior responses over the past few weeks. The OPT therapist worked with the patient utilizing an in session Cognitive Behavioral Therapy exercise. The patient was responsive in the session and verbalized," Its cooler now in September so that means more yard work". The OPT therapist worked with the patient talking with her about the grieving process and working on implementing positive thinking, maintaining with basic self care and reviewing upcoming care appointments including Dr. Shea Evans later today. The OPT therapist will continue treatment work with the patient in her next scheduled session   Plan: Return again in 3 weeks.   Diagnosis:      Axis I: Bipolar 1 disorder,depressed mild/GAD/Insomnia/ Attention deficit.                             Axis II: No diagnosis   I discussed the assessment and treatment plan with the patient. The patient was provided an opportunity to ask questions and all were answered. The patient agreed with the plan and demonstrated an understanding of the instructions.   The patient was advised to call back or seek an in-person evaluation if the symptoms worsen or if the condition fails to improve as anticipated.   I provided 30 minutes of non-face-to-face time during this encounter.   [name], LCSW  07/14/2021

## 2021-07-14 NOTE — Progress Notes (Signed)
Virtual Visit via Video Note  I connected with Stacy Chambers on 07/14/21 at  3:20 PM EDT by a video enabled telemedicine application and verified that I am speaking with the correct person using two identifiers.  Location Provider Location : [facility] Patient Location : Home  Participants: Patient , Provider    I discussed the limitations of evaluation and management by telemedicine and the availability of in person appointments. The patient expressed understanding and agreed to proceed.   I discussed the assessment and treatment plan with the patient. The patient was provided an opportunity to ask questions and all were answered. The patient agreed with the plan and demonstrated an understanding of the instructions.   The patient was advised to call back or seek an in-person evaluation if the symptoms worsen or if the condition fails to improve as anticipated.  [facility] MD OP Progress Note  07/14/2021 3:42 PM LOZA PRELL  MRN:  [id number]  Chief Complaint:  Chief Complaint   Follow-up; Anxiety; Depression    HPI: Stacy Chambers is a 55 year old Caucasian female, lives in [place], has a history of bipolar disorder, GAD, insomnia, neuroleptic induced akathisia, memory loss, RLS, COPD, chronic pain, abnormal liver function test was evaluated by telemedicine today.  Patient today reports she continues to grieve the loss of her husband who passed away suddenly due to cancer.  Patient does have social support system from friends and family.  Patient reports she has established care with Mr. Maye Hides and reports therapy sessions are helpful.  Patient is currently struggling with possible 'stomach bug' reports GI symptoms like diarrhea, vomiting as well as fatigue.  She reports she currently feels better.  Agrees to get in touch with her primary provider if it does not get better.  Patient continues to struggle with her memory.  Patient had neurological evaluation as well as was  diagnosed with vitamin B12 deficiency.  She however has not started taking vitamin B12 replacement yet.  She agrees to get in touch with her primary provider.  She also has upcoming appointment and agrees to discuss.  Patient is compliant on her medications for her mood.  Denies any suicidality, homicidality or perceptual disturbances.  Patient denies any other concerns today.  Visit Diagnosis:    ICD-10-CM   1. Bipolar 1 disorder, depressed, mild (HCC)  F31.31 ziprasidone (GEODON) 80 MG capsule    2. GAD (generalized anxiety disorder)  F41.1 lamoTRIgine (LAMICTAL) 150 MG tablet    3. Insomnia due to mental disorder  F51.05    mood    4. Bereavement  Z63.4     5. Memory loss  R41.3       Past Psychiatric History: Reviewed past psychiatric history from progress note on 10/12/2018.  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   Chronic obstructive pulmonary disease ([place]) 10/03/2015   COPD (chronic obstructive pulmonary disease) ([place])    no inhalers   Elevated liver function tests 11/26/2004   H/O NEGATIVE LIVER BIOPSY, workup negative   Gallstones 02/21/2015   GERD (gastroesophageal reflux  disease)    Hyperlipidemia    Insomnia    Lower back pain    Osteoarthritis    Ovarian cancer ([place]) 1995   RLS (restless legs syndrome)    Sedative, hypnotic or anxiolytic dependence with withdrawal, 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[place])    Wears dentures    full upper and lower    Past Surgical History:  Procedure Laterality Date   ABDOMINAL HYSTERECTOMY     APPENDECTOMY     BREAST BIOPSY Left 03/12/2015   fat necrosis   CESAREAN  SECTION     COLONOSCOPY WITH PROPOFOL N/A 05/30/2018   Procedure: COLONOSCOPY WITH PROPOFOL;  Surgeon: Manya Silvas, MD;  Location: [facility] ENDOSCOPY;  Service: Endoscopy;  Laterality: N/A;   EXCISION MORTON'S NEUROMA Left 10/07/2016   Procedure: EXCISION MORTON'S NEUROMA;  Surgeon: [name], DPM;  Location: [facility];  Service: Podiatry;  Laterality: Left;  IV WITH LOCAL   LIVER BIOPSY  2005   PerPt: Done to eval elevated LFTs and biopsy provided no definite dx/cause of elevated LFTs   MULTIPLE TOOTH EXTRACTIONS     OOPHORECTOMY     OSTECTOMY Right 04/10/2020   Procedure: DOUBLE OSTEOTOMY GENERAL W/ LOCAL;  Surgeon: [name], DPM;  Location: [facility];  Service: Podiatry;  Laterality: Right;    Family Psychiatric History: Reviewed family psychiatric history from progress note on 10/12/2018  Family History:  Family History  Adopted: Yes  Problem Relation Age of Onset   COPD Mother    Depression Mother    Colon cancer Mother        unsure of age   76 Father        Lung CA, Skin Cancer--?type   COPD Father    Stroke Father    Cancer Sister 15       cervical cancer   COPD Brother    Alcohol abuse Brother    Stroke Brother    COPD Maternal Aunt    Depression Daughter    Schizophrenia Son    Breast cancer Neg Hx     Social History: Reviewed social history from progress note on 10/12/2018 Social History   Socioeconomic History   Marital status: Married    Spouse name: Not on file   Number of children: Not on file   Years of education: Not on file   Highest education level: Not on file  Occupational History   Not on file  Tobacco Use   Smoking status: Former    Packs/day: 1.00    Years: 30.00    Pack years: 30.00    Types: Cigarettes    Quit date: 04/08/2009    Years since quitting: 12.2   Smokeless tobacco: Never  Vaping Use   Vaping Use: Never used  Substance and Sexual Activity   Alcohol use: Yes    Alcohol/week: 3.0 - 7.0  standard drinks    Types: 3 - 7 Shots of liquor per week    Comment: occasionally   Drug use: Yes    Types: Marijuana    Comment: told to refrain until after surgery   Sexual activity: Yes    Birth control/protection: Surgical  Other Topics Concern   Not on file  Social History Narrative   Not on file   Social Determinants of Health   Financial Resource Strain: Low Risk    Difficulty of Paying Living Expenses: Not very hard  Food Insecurity: Not on file  Transportation Needs: Not on file  Physical Activity: Not on file  Stress: Not on file  Social Connections: Not on file    Allergies:  Allergies  Allergen Reactions   Benadryl [Diphenhydramine Hcl]     Hives/ rapid heartrate   Codeine     dizziness   Vicodin [Hydrocodone-Acetaminophen] Other (See Comments)    dizziness    Metabolic Disorder Labs: Lab Results  Component Value Date   HGBA1C 5.6 09/14/2018   MPG 114 09/14/2018   No results found for: PROLACTIN Lab Results  Component Value Date   CHOL 167 06/18/2020   TRIG 165 (H) 06/18/2020   HDL 54 06/18/2020   CHOLHDL 3.1 06/18/2020   VLDL 18 03/04/2015   LDLCALC 87 06/18/2020   LDLCALC 73 12/19/2019   Lab Results  Component Value Date   TSH 1.51 12/19/2019   TSH 2.320 10/24/2018    Therapeutic Level Labs: Lab Results  Component Value Date   LITHIUM 0.3 (L) 10/24/2018   LITHIUM 0.49 (L) 01/13/2009   No results found for: VALPROATE No components found for:  CBMZ  Current Medications: Current Outpatient Medications  Medication Sig Dispense Refill   alendronate (FOSAMAX) 70 MG tablet TAKE 1 TABLET (70 MG TOTAL) BY MOUTH ONCE A WEEK. TAKE WITH A FULL GLASS OF WATER ON AN EMPTY STOMACH. 12 tablet 3   ammonium lactate (AMLACTIN) 12 % cream APPLY TOPICALLY AS NEEDED FOR DRY SKIN 385 g 0   benztropine (COGENTIN) 1 MG tablet TAKE 1 TABLET (1 MG TOTAL) BY MOUTH 2 (TWO) TIMES DAILY AS NEEDED FOR TREMORS. 180 tablet 0   budesonide-formoterol (SYMBICORT)  160-4.5 MCG/ACT inhaler Inhale 2 puffs into the lungs 2 (two) times daily. 3 each 3   clonazePAM (KLONOPIN) 0.5 MG tablet Take 1 tablet (0.5 mg total) by mouth as directed. Take 1 tablet 2-3 times a week for severe panic attacks only 13 tablet 0   escitalopram (LEXAPRO) 20 MG tablet Take 1 tablet (20 mg total) by mouth daily. 90 tablet 0   esomeprazole (NEXIUM) 20 MG capsule Take 1 capsule (20 mg total) by mouth daily as needed (take on empty stomach). 90 capsule 1   FLULAVAL QUADRIVALENT 0.5 ML injection      fluticasone (FLONASE) 50 MCG/ACT nasal spray Place 2 sprays into both nostrils daily. 16 mL 2   hydrochlorothiazide (HYDRODIURIL) 12.5 MG tablet      hydrOXYzine (ATARAX/VISTARIL) 50 MG tablet Take 1 tablet (50 mg total) by mouth 2 (two) times daily as needed. 180 tablet 0   lamoTRIgine (LAMICTAL) 150 MG tablet Take 0.5 tablets (75 mg total) by mouth 2 (two) times daily. 90 tablet 0   meloxicam (MOBIC) 7.5 MG tablet TAKE 1 TABLET TWICE DAILY AS NEEDED FOR PAIN 180 tablet 0   methocarbamol (ROBAXIN) 500 MG tablet      rosuvastatin (CRESTOR) 10 MG tablet TAKE 1 TABLET EVERY DAY 90 tablet 3   traZODone (DESYREL) 100 MG tablet TAKE 2 TABLETS (200 MG TOTAL) BY MOUTH AT BEDTIME AS NEEDED FOR SLEEP. 180 tablet 0   ziprasidone (GEODON) 80 MG capsule TAKE 1 CAPSULE EVERY DAY WITH SUPPER  (DOSE  INCREASE) 90 capsule 0   No current facility-administered medications for this visit.     Musculoskeletal: Strength & Muscle Tone:  UTA Gait & Station: Seated Patient leans: Backward  Psychiatric Specialty Exam: Review of Systems  Constitutional:  Positive for fatigue.  Gastrointestinal:  Positive for diarrhea and vomiting.  Psychiatric/Behavioral:  Positive for sleep disturbance. The patient is nervous/anxious.   All other systems reviewed and are  negative.  There were no vitals taken for this visit.There is no height or weight on file to calculate BMI.  General Appearance: Casual  Eye Contact:   Fair  Speech:  Clear and Coherent  Volume:  Normal  Mood:  Anxious  Affect:  Congruent  Thought Process:  Goal Directed and Descriptions of Associations: Intact  Orientation:  Full (Time, Place, and Person)  Thought Content: Logical   Suicidal Thoughts:  No  Homicidal Thoughts:  No  Memory:  Immediate;   Fair Recent;   Fair Remote;   Fair  Judgement:  Fair  Insight:  Fair  Psychomotor Activity:  Normal  Concentration:  Concentration: Fair and Attention Span: Fair  Recall:  [employer] of Knowledge: Fair  Language: Fair  Akathisia:  No  Handed:  Left  AIMS (if indicated): done  Assets:  Communication Skills Desire for Improvement Housing Social Support Transportation  ADL's:  Intact  Cognition: WNL  Sleep:   restless   Screenings: AIMS    Flowsheet Row Video Visit from 06/04/2021 in [facility] Visit from 01/28/2021 in [facility] Visit from 10/24/2018 in [facility] Visit from 04/04/2018 in [facility] Visit from 01/03/2018 in [facility] Total Score 0 0 5 0 0      GAD-7    Flowsheet Row Video Visit from 06/25/2021 in [facility] from 03/10/2021 in [facility]  Total GAD-7 Score 17 12      PHQ2-9    [place] Visit from 01/28/2021 in [facility] Video Visit from 12/16/2020 in [facility] Office Visit from 02/07/2020 in [facility] Visit from 12/19/2019 in [facility] Visit from 10/21/2018 in [facility]  PHQ-2 Total Score 6 5 0 0 4  PHQ-9 Total Score 15 20 -- -- 21      Flowsheet Row Video Visit from 06/04/2021 in [facility] Counselor from 03/10/2021 in  [facility] Office Visit from 01/28/2021 in [facility] No Risk No Risk        Assessment and Plan: Stacy Chambers is a 55 year old Caucasian female, widowed, lives in [place], has a history of bipolar disorder, anxiety disorder, COPD, RLS, elevated liver enzymes was evaluated by telemedicine today.  Patient is currently struggling with GI symptoms, continues to grieve the loss of her husband.  Discussed plan as noted below.  Plan Bipolar disorder-unstable Geodon 80 mg p.o. daily-reduced dosage Lamotrigine 75 mg p.o. twice daily Cogentin 1 mg p.o. twice daily as needed for severe side effects of Geodon Lexapro 20 mg p.o. daily-recently increased on 06/25/2021. Patient with current GI symptoms, will not make any further medication changes today. AIMS - 0  GAD-improving Klonopin 0.5 mg up to 2-3 times a week only for severe panic attacks Lexapro 20 mg p.o. daily Hydroxyzine 50 mg p.o. twice daily as needed Continue CBT with Mr. Maye Hides  Insomnia-stable Trazodone 200 mg p.o. nightly  Bereavement-unstable Continue grief counseling  Memory problems-likely multifactorial, vitamin B12 deficiency-unstable I have reviewed and discussed labs-Lamictal level-06/26/2021-1.4-subtherapeutic Vitamin B12-low at 213. Patient has been advised to reach out to primary care provider for replacement. Provided education. I will route this message to her primary care provider.  Patient also to reach out to the nearest  urgent care or primary care provider for her current GI symptoms.  Follow-up in clinic in 2 to 3 weeks or sooner in person.  This note was generated in part or whole with voice recognition software. Voice recognition is usually quite accurate but there are transcription errors that can and very often do occur. I apologize for any typographical errors that were not detected and  corrected.     Ursula Alert, MD 07/15/2021, 5:27 PM

## 2021-07-24 ENCOUNTER — Other Ambulatory Visit: Payer: Self-pay | Admitting: Family Medicine

## 2021-08-04 ENCOUNTER — Other Ambulatory Visit: Payer: Self-pay

## 2021-08-04 ENCOUNTER — Ambulatory Visit (INDEPENDENT_AMBULATORY_CARE_PROVIDER_SITE_OTHER): Payer: Medicare HMO | Admitting: Clinical

## 2021-08-04 DIAGNOSIS — F411 Generalized anxiety disorder: Secondary | ICD-10-CM

## 2021-08-04 DIAGNOSIS — F3131 Bipolar disorder, current episode depressed, mild: Secondary | ICD-10-CM

## 2021-08-04 DIAGNOSIS — R41840 Attention and concentration deficit: Secondary | ICD-10-CM

## 2021-08-04 NOTE — Progress Notes (Signed)
Virtual Visit via Video Note   I connected with Stacy Chambers on 08/04/21 at  8:00 AM EDT by a video enabled telemedicine application and verified that I am speaking with the correct person using two identifiers.   Location: Patient: Home Provider: Office   I discussed the limitations of evaluation and management by telemedicine and the availability of in person appointments. The patient expressed understanding and agreed to proceed.   THERAPIST PROGRESS NOTE   Session Time: 8:00 AM-8:40AM   Participation Level: Active   Behavioral Response: CasualAlertDepressed   Type of Therapy: Individual Therapy   Treatment Goals addressed: Coping   Interventions: CBT   Summary: Stacy Chambers is a 55 y.o. female who presents with Bipolar Disorder./ GAD/Insomnia. The OPT therapist worked with the patient for her ongoing OPT treatment session. The OPT therapist utilized Motivational Interviewing to assist in creating therapeutic repore. The patient in the session was engaged and work in collaboration giving feedback about her triggers and symptoms over the past few weeks. The patient spoke about her ongoing adjustment with the recent loss of her husband . The patient spoke about getting sick/nausea waking up in the middle of the night. And getting sick. The patient spoke about losing another family member her niece due to a car accident.The OPT therapist utilized Cognitive Behavioral Therapy through cognitive restructuring as well as worked with the patient on coping strategies to assist in management of mood. The patient continues to work through readjusting both mentally, financially, and emotionally from losing her husband. The patient spoke about going through so many family losses and still feeling she is in shock and hasn't been able to process.The OPT therapist worked with the patient on continuing to maintain her basic needs of self care with eating, hygiene, and sleep. The patient spoke about  enjoying the recent weather and getting out doing yard work over the course of the past weekend as well as decorating for the holiday season and planning for Christmas.   Suicidal/Homicidal: Nowithout intent/plan   Therapist Response: The OPT therapist worked with the patient for the patients scheduled session. The patient was engaged in her session and gave feedback in relation to triggers, symptoms, and behavior responses over the past few weeks. The OPT therapist worked with the patient utilizing an in session Cognitive Behavioral Therapy exercise. The patient was responsive in the session and verbalized," I keep myself busy and I am doing things with my brother he is getting my car today but I am going with him today". The OPT therapist worked with the patient talking with her about the grieving process and working on implementing positive thinking, maintaining with basic self care. The patients psychiatrist has urged the patient to continue working in her therapy as a way to work through her grief from multiple family losses.  The patient spoke about her upcoming appointments tomorrow to do blood work in the morning and appointment with Dr. Shea Evans tomorrow in which she hopes her medication will be adjusted to help her deal with Depression and family lose.The patient spoke about additionally her sister who has been given only a few months to live.The OPT therapist will continue treatment work with the patient in her next scheduled session   Plan: Return again in 3 weeks.   Diagnosis:      Axis I: Bipolar 1 disorder,depressed mild/GAD/Insomnia/ Attention deficit.  Axis II: No diagnosis   I discussed the assessment and treatment plan with the patient. The patient was provided an opportunity to ask questions and all were answered. The patient agreed with the plan and demonstrated an understanding of the instructions.   The patient was advised to call back or seek an  in-person evaluation if the symptoms worsen or if the condition fails to improve as anticipated.   I provided 40 minutes of non-face-to-face time during this encounter.   [name], LCSW  08/04/2021

## 2021-08-05 ENCOUNTER — Telehealth (INDEPENDENT_AMBULATORY_CARE_PROVIDER_SITE_OTHER): Payer: Medicare HMO | Admitting: Psychiatry

## 2021-08-05 ENCOUNTER — Other Ambulatory Visit: Payer: Self-pay

## 2021-08-05 ENCOUNTER — Encounter: Payer: Self-pay | Admitting: Psychiatry

## 2021-08-05 DIAGNOSIS — Z634 Disappearance and death of family member: Secondary | ICD-10-CM | POA: Diagnosis not present

## 2021-08-05 DIAGNOSIS — F3162 Bipolar disorder, current episode mixed, moderate: Secondary | ICD-10-CM

## 2021-08-05 DIAGNOSIS — G3184 Mild cognitive impairment, so stated: Secondary | ICD-10-CM | POA: Diagnosis not present

## 2021-08-05 DIAGNOSIS — R748 Abnormal levels of other serum enzymes: Secondary | ICD-10-CM | POA: Diagnosis not present

## 2021-08-05 DIAGNOSIS — F411 Generalized anxiety disorder: Secondary | ICD-10-CM

## 2021-08-05 DIAGNOSIS — J449 Chronic obstructive pulmonary disease, unspecified: Secondary | ICD-10-CM | POA: Diagnosis not present

## 2021-08-05 DIAGNOSIS — E785 Hyperlipidemia, unspecified: Secondary | ICD-10-CM | POA: Diagnosis not present

## 2021-08-05 DIAGNOSIS — M810 Age-related osteoporosis without current pathological fracture: Secondary | ICD-10-CM | POA: Diagnosis not present

## 2021-08-05 DIAGNOSIS — F5105 Insomnia due to other mental disorder: Secondary | ICD-10-CM

## 2021-08-05 DIAGNOSIS — E538 Deficiency of other specified B group vitamins: Secondary | ICD-10-CM | POA: Diagnosis not present

## 2021-08-05 DIAGNOSIS — Z6829 Body mass index (BMI) 29.0-29.9, adult: Secondary | ICD-10-CM | POA: Diagnosis not present

## 2021-08-05 DIAGNOSIS — Z23 Encounter for immunization: Secondary | ICD-10-CM | POA: Diagnosis not present

## 2021-08-05 DIAGNOSIS — Z1231 Encounter for screening mammogram for malignant neoplasm of breast: Secondary | ICD-10-CM | POA: Diagnosis not present

## 2021-08-05 DIAGNOSIS — Z7185 Encounter for immunization safety counseling: Secondary | ICD-10-CM | POA: Diagnosis not present

## 2021-08-05 DIAGNOSIS — Z9189 Other specified personal risk factors, not elsewhere classified: Secondary | ICD-10-CM | POA: Diagnosis not present

## 2021-08-05 MED ORDER — TRAZODONE HCL 100 MG PO TABS: 200.0000 mg | ORAL_TABLET | Freq: Every evening | ORAL | 0 refills | Status: DC | PRN

## 2021-08-05 MED ORDER — BENZTROPINE MESYLATE 1 MG PO TABS: 1.0000 mg | ORAL_TABLET | Freq: Two times a day (BID) | ORAL | 0 refills | Status: DC | PRN

## 2021-08-05 MED ORDER — OLANZAPINE 2.5 MG PO TABS: 2.5000 mg | ORAL_TABLET | Freq: Every day | ORAL | 0 refills | Status: DC

## 2021-08-05 NOTE — Patient Instructions (Signed)
Olanzapine tablets What is this medication? OLANZAPINE (oh LAN za peen) is used to treat schizophrenia, psychotic disorders, and bipolar disorder. Bipolar disorder is also known as manic-depression. This medicine may be used for other purposes; ask your health care provider or pharmacist if you have questions. COMMON BRAND NAME(S): Zyprexa What should I tell my care team before I take this medication? They need to know if you have any of these conditions: blockage in your bowel cigarette smoker constipation dementia diabetes difficulty swallowing glaucoma have trouble controlling your muscles heart disease high cholesterol high levels of prolactin history of breast cancer history of irregular heartbeat history of stroke liver disease low blood counts, like low white cell, platelet, or red cell counts low blood pressure Parkinson's disease prostate disease seizures suicidal thoughts, plans or attempt; a previous suicide attempt by you or a family member trouble passing urine an unusual or allergic reaction to olanzapine, other medicines, foods, dyes, or preservatives pregnant or trying to get pregnant breast-feeding How should I use this medication? Take this medicine by mouth. Swallow it with a drink of water. Follow the directions on the prescription label. Take your medicine at regular intervals. Do not take it more often than directed. Do not stop taking except on the advice of your doctor or health care professional. A special MedGuide will be given to you by the pharmacist with each new prescription and refill. Be sure to read this information carefully each time. Talk to your pediatrician regarding the use of this medicine in children. While this drug may be prescribed for children as young as 13 years for selected conditions, precautions do apply. Overdosage: If you think you have taken too much of this medicine contact a poison control center or emergency room at  once. NOTE: This medicine is only for you. Do not share this medicine with others. What if I miss a dose? If you miss a dose, take it as soon as you can. If it is almost time for your next dose, take only that dose. Do not take double or extra doses. What may interact with this medication? Do not take this medicine with any of the following medications: dronedarone cisapride metoclopramide pimozide thioridazine This medicine may also interact with the following medications: alcohol antihistamines for allergy, cough, and cold atropine carbamazepine certain medicines for anxiety or sleep certain medicines for bladder problems like oxybutynin, tolterodine certain medicines for depression like amitriptyline, fluoxetine, sertraline certain medicines for stomach problems like dicyclomine, hyoscyamine certain medicines for travel sickness like scopolamine fluvoxamine general anesthetics like halothane, isoflurane, methoxyflurane, propofol levodopa or other medicines for Parkinson's disease medicines for blood pressure medicines for seizures medicines that relax muscles for surgery narcotic medicines for pain omeprazole other medicines that prolong the QT interval (an abnormal heart rhythm) phenothiazines like chlorpromazine, prochlorperazine rifampin This list may not describe all possible interactions. Give your health care provider a list of all the medicines, herbs, non-prescription drugs, or dietary supplements you use. Also tell them if you smoke, drink alcohol, or use illegal drugs. Some items may interact with your medicine. What should I watch for while using this medication? Visit your health care professional for regular checks on your progress. Tell your health care professional if symptoms do not start to get better or if they get worse. Do not stop taking except on your health care professional's advice. You may develop a severe reaction. Your health care professional will  tell you how much medicine to take. This medicine may cause  serious skin reactions. They can happen weeks to months after starting the medicine. Contact your health care provider right away if you notice fevers or flu-like symptoms with a rash. The rash may be red or purple and then turn into blisters or peeling of the skin. Or, you might notice a red rash with swelling of the face, lips or lymph nodes in your neck or under your arms. You may get dizzy or drowsy. Do not drive, use machinery, or do anything that needs mental alertness until you know how this medicine affects you. Do not stand or sit up quickly, especially if you are an older patient. This reduces the risk of dizzy or fainting spells. Alcohol may interfere with the effect of this medicine. Avoid alcoholic drinks. Your mouth may get dry. Chewing sugarless gum or sucking hard candy, and drinking plenty of water will help. This drug can cause problems with controlling your body temperature. It can lower the response of your body to cold temperatures. If possible, stay indoors during cold weather. If you must go outdoors, wear warm clothes. It can also lower the response of your body to heat. Do not overheat. Do not over-exercise. Stay out of the sun when possible. If you must be in the sun, wear cool clothing. Drink plenty of water. If you have trouble controlling your body temperature, call your health care provider right away. This medicine may increase blood sugar. Ask your health care provider if changes in diet or medicines are needed if you have diabetes. If you smoke, tell your doctor if you notice this medicine is not working well for you. Talk to your doctor if you are a smoker or if you decide to stop smoking. What side effects may I notice from receiving this medication? Side effects that you should report to your doctor or health care professional as soon as possible: allergic reactions like skin rash, itching or hives, swelling of  the face, lips, or tongue breast enlargement in both males and females breathing problems confusion fast, irregular heartbeat fever or chills, sore throat inability to keep still males: prolonged or painful erection missed menstrual periods problems with balance, talking, walking rash, fever, and swollen lymph nodes redness, blistering, peeling or loosening of the skin, including inside the mouth seizures signs and symptoms of high blood sugar such as being more thirsty or hungry or having to urinate more than normal. You may also feel very tired or have blurry vision signs and symptoms of low blood pressure like dizziness; feeling faint or lightheaded, falls; unusually weak or tired signs and symptoms of neuroleptic malignant syndrome like confusion; fast or irregular heartbeat; high fever; increased sweating; stiff muscles sudden numbness or weakness of the face, arm, or leg suicidal thoughts or other mood changes trouble swallowing uncontrollable movements of the arms, face, head, mouth, neck, or upper body Side effects that usually do not require medical attention (report to your doctor or health care professional if they continue or are bothersome): change in sex drive or performance constipation drowsiness weight gain This list may not describe all possible side effects. Call your doctor for medical advice about side effects. You may report side effects to FDA at 1-800-FDA-1088. Where should I keep my medication? Keep out of the reach of children. Store at controlled room temperature between 15 and 30 degrees C (59 and 86 degrees F). Protect from light and moisture. Throw away any unused medicine after the expiration date. NOTE: This sheet is a summary. It  may not cover all possible information. If you have questions about this medicine, talk to your doctor, pharmacist, or health care provider.  2022 Elsevier/Gold Standard (2019-08-30 11:42:15)

## 2021-08-05 NOTE — Progress Notes (Signed)
Virtual Visit via Video Note  I connected with Stacy Chambers on 08/05/21 at  3:20 PM EDT by a video enabled telemedicine application and verified that I am speaking with the correct person using two identifiers.  Location Provider Location : [facility] Patient Location : [facility]  Participants: Patient , Provider   I discussed the limitations of evaluation and management by telemedicine and the availability of in person appointments. The patient expressed understanding and agreed to proceed.   I discussed the assessment and treatment plan with the patient. The patient was provided an opportunity to ask questions and all were answered. The patient agreed with the plan and demonstrated an understanding of the instructions.   The patient was advised to call back or seek an in-person evaluation if the symptoms worsen or if the condition fails to improve as anticipated.   [facility] MD OP Progress Note  08/05/2021 4:02 PM Stacy Chambers  MRN:  [id number]  Chief Complaint:  Chief Complaint   Follow-up; Depression; Anxiety    HPI: Stacy Chambers is a 55 year old Caucasian female, lives in [place], has a history of bipolar disorder, GAD, insomnia, chronic pain, neuroleptic induced akathisia, memory loss, vitamin B12 deficiency, was evaluated by telemedicine today.  Patient was supposed to come into the office for an in person visit however reported that she completely forgot and hence this appointment was changed to virtual visit and patient agreed to it.  Patient today reports she continues to grieve the loss of her husband who passed away due to cancer recently.  Patient also reports her sisters are currently going through health problems.  Her sister Stacy Chambers is struggling with stage IV cancer and is going to hospice tomorrow.  Patient also reports her Sister Stacy Chambers who lives in [place] is also struggling with skin cancer.  One of her nieces recently passed away in a motor vehicle accident leaving  behind 2 children.  She reports all this deaths and health problems around her as making her more and more depressed.  She also reports possible hypomanic/manic symptoms like spending money.  She reports she is very impulsive to the point that she has to splurge to spend a lot of money.  She reports sleep is restless.  She denies any suicidality or homicidality.  She did not appear to be preoccupied with any paranoia or delusions.  Patient reports she continues to be in therapy with Mr. Maye Hides and reports therapy sessions are beneficial.  She is motivated to stay in therapy.  Patient reports she had her evaluation by her primary care provider for her vitamin B12 deficiency and was started on the same.  Patient denies any other concerns today.  Visit Diagnosis:    ICD-10-CM   1. Bipolar 1 disorder, mixed, moderate (HCC)  F31.62 OLANZapine (ZYPREXA) 2.5 MG tablet    2. Generalized anxiety disorder  F41.1 traZODone (DESYREL) 100 MG tablet    3. Insomnia due to mental disorder  F51.05 benztropine (COGENTIN) 1 MG tablet   Grief, mood    4. Bereavement  Z63.4     5. MCI (mild cognitive impairment)  G31.84     6. At risk for prolonged QT interval syndrome  Z91.89 EKG 12-Lead      Past Psychiatric History: Reviewed past psychiatric history from progress note on 10/12/2018.  Past trials of BuSpar, Zoloft, carbamazepine.  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   Chronic obstructive pulmonary disease ([place]) 10/03/2015   COPD (chronic obstructive pulmonary disease) (HCC)    no inhalers   Elevated liver function tests 11/26/2004   H/O NEGATIVE LIVER BIOPSY,  workup negative   Gallstones 02/21/2015   GERD (gastroesophageal reflux disease)    Hyperlipidemia    Insomnia    Lower back pain    Osteoarthritis    Ovarian cancer ([place]) 1995   RLS (restless legs syndrome)    Sedative, hypnotic or anxiolytic dependence with withdrawal, 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[place])    Wears dentures    full upper and lower    Past Surgical History:  Procedure Laterality Date   ABDOMINAL HYSTERECTOMY     APPENDECTOMY     BREAST BIOPSY Left 03/12/2015   fat necrosis   CESAREAN SECTION     COLONOSCOPY WITH PROPOFOL N/A 05/30/2018   Procedure: COLONOSCOPY WITH PROPOFOL;  Surgeon: Manya Silvas, MD;  Location[facility] ENDOSCOPY;  Service: Endoscopy;  Laterality: N/A;   EXCISION MORTON'S NEUROMA Left 10/07/2016   Procedure: EXCISION MORTON'S NEUROMA;  Surgeon: [name], DPM;  Location: [facility];  Service: Podiatry;  Laterality: Left;  IV WITH LOCAL   LIVER BIOPSY  2005   PerPt: Done to eval elevated LFTs and biopsy provided no definite dx/cause of elevated LFTs   MULTIPLE TOOTH EXTRACTIONS     OOPHORECTOMY     OSTECTOMY Right 04/10/2020   Procedure: DOUBLE OSTEOTOMY GENERAL W/ LOCAL;  Surgeon: [name], DPM;  Location: [facility];  Service: Podiatry;  Laterality: Right;    Family Psychiatric History: Reviewed family psychiatric history from progress note on 10/12/2018.  Family History:  Family History  Adopted: Yes  Problem Relation Age of Onset   COPD Mother    Depression Mother    Colon cancer Mother        unsure of age   62 Father        Lung CA, Skin Cancer--?type   COPD Father    Stroke Father    Cancer Sister 7       cervical cancer   COPD Brother    Alcohol abuse Brother    Stroke Brother    COPD Maternal Aunt    Depression Daughter    Schizophrenia Son    Breast cancer Neg Hx     Social History: Reviewed social history from progress note on 10/12/2018. Social History   Socioeconomic History   Marital status:  Widowed    Spouse name: Not on file   Number of children: Not on file   Years of education: Not on file   Highest education level: Not on file  Occupational History   Not on file  Tobacco Use   Smoking status: Former    Packs/day: 1.00    Years: 30.00    Pack years: 30.00    Types: Cigarettes    Quit date: 04/08/2009    Years since quitting: 12.3   Smokeless tobacco: Never  Vaping Use   Vaping Use: Never used  Substance and Sexual Activity   Alcohol use: Yes    Alcohol/week: 3.0 - 7.0 standard drinks    Types: 3 - 7 Shots of liquor per week  Comment: occasionally   Drug use: Yes    Types: Marijuana    Comment: told to refrain until after surgery   Sexual activity: Yes    Birth control/protection: Surgical  Other Topics Concern   Not on file  Social History Narrative   Not on file   Social Determinants of Health   Financial Resource Strain: Low Risk    Difficulty of Paying Living Expenses: Not very hard  Food Insecurity: Not on file  Transportation Needs: Not on file  Physical Activity: Not on file  Stress: Not on file  Social Connections: Not on file    Allergies:  Allergies  Allergen Reactions   Benadryl [Diphenhydramine Hcl]     Hives/ rapid heartrate   Codeine     dizziness   Vicodin [Hydrocodone-Acetaminophen] Other (See Comments)    dizziness    Metabolic Disorder Labs: Lab Results  Component Value Date   HGBA1C 5.6 09/14/2018   MPG 114 09/14/2018   No results found for: PROLACTIN Lab Results  Component Value Date   CHOL 167 06/18/2020   TRIG 165 (H) 06/18/2020   HDL 54 06/18/2020   CHOLHDL 3.1 06/18/2020   VLDL 18 03/04/2015   LDLCALC 87 06/18/2020   LDLCALC 73 12/19/2019   Lab Results  Component Value Date   TSH 1.51 12/19/2019   TSH 2.320 10/24/2018    Therapeutic Level Labs: Lab Results  Component Value Date   LITHIUM 0.3 (L) 10/24/2018   LITHIUM 0.49 (L) 01/13/2009   No results found for: VALPROATE No components found  for:  CBMZ  Current Medications: Current Outpatient Medications  Medication Sig Dispense Refill   OLANZapine (ZYPREXA) 2.5 MG tablet Take 1 tablet (2.5 mg total) by mouth at bedtime. Stop geodon 15 tablet 0   alendronate (FOSAMAX) 70 MG tablet TAKE 1 TABLET (70 MG TOTAL) BY MOUTH ONCE A WEEK. TAKE WITH A FULL GLASS OF WATER ON AN EMPTY STOMACH. 12 tablet 3   ammonium lactate (AMLACTIN) 12 % cream APPLY TOPICALLY AS NEEDED FOR DRY SKIN 385 g 0   benztropine (COGENTIN) 1 MG tablet Take 1 tablet (1 mg total) by mouth 2 (two) times daily as needed for tremors. 180 tablet 0   budesonide-formoterol (SYMBICORT) 160-4.5 MCG/ACT inhaler Inhale 2 puffs into the lungs 2 (two) times daily. 3 each 3   clonazePAM (KLONOPIN) 0.5 MG tablet Take 1 tablet (0.5 mg total) by mouth as directed. Take 1 tablet 2-3 times a week for severe panic attacks only 13 tablet 0   escitalopram (LEXAPRO) 20 MG tablet Take 1 tablet (20 mg total) by mouth daily. 90 tablet 0   esomeprazole (NEXIUM) 20 MG capsule Take 1 capsule (20 mg total) by mouth daily as needed (take on empty stomach). 90 capsule 1   FLULAVAL QUADRIVALENT 0.5 ML injection      fluticasone (FLONASE) 50 MCG/ACT nasal spray Place 2 sprays into both nostrils daily. 16 mL 2   hydrochlorothiazide (HYDRODIURIL) 12.5 MG tablet      hydrOXYzine (ATARAX/VISTARIL) 50 MG tablet Take 1 tablet (50 mg total) by mouth 2 (two) times daily as needed. 180 tablet 0   lamoTRIgine (LAMICTAL) 150 MG tablet Take 0.5 tablets (75 mg total) by mouth 2 (two) times daily. 90 tablet 0   meloxicam (MOBIC) 7.5 MG tablet TAKE 1 TABLET TWICE DAILY AS NEEDED FOR PAIN 180 tablet 0   methocarbamol (ROBAXIN) 500 MG tablet      rosuvastatin (CRESTOR) 10 MG tablet TAKE 1 TABLET EVERY  DAY 90 tablet 3   traZODone (DESYREL) 100 MG tablet Take 2 tablets (200 mg total) by mouth at bedtime as needed for sleep. HOLD THIS MEDICATION FOR NOW SINCE YOU ARE GOING TO START ZYPREXA (olanzapine ) 180 tablet 0    No current facility-administered medications for this visit.     Musculoskeletal: Strength & Muscle Tone:  UTA Gait & Station: normal Patient leans: N/A  Psychiatric Specialty Exam: Review of Systems  Psychiatric/Behavioral:  Positive for dysphoric mood and sleep disturbance. The patient is nervous/anxious.   All other systems reviewed and are negative.  There were no vitals taken for this visit.There is no height or weight on file to calculate BMI.  General Appearance: Casual  Eye Contact:  Fair  Speech:  Clear and Coherent  Volume:  Normal  Mood:  Anxious and Depressed  Affect:  Congruent  Thought Process:  Goal Directed and Descriptions of Associations: Intact  Orientation:  Full (Time, Place, and Person)  Thought Content: Logical   Suicidal Thoughts:  No  Homicidal Thoughts:  No  Memory:  Immediate;   Fair Recent;   Fair Remote;   Poor has short term memory loss  Judgement:  Fair  Insight:  Shallow  Psychomotor Activity:  Normal  Concentration:  Concentration: Fair and Attention Span: Fair  Recall:  [employer] of Knowledge: Fair  Language: Fair  Akathisia:  No  Handed:  Left  AIMS (if indicated): done  Assets:  Communication Skills Desire for Improvement Housing Social Support Transportation  ADL's:  Intact  Cognition: WNL  Sleep:  Poor   Screenings: AIMS    Flowsheet Row Video Visit from 06/04/2021 in [facility] Visit from 01/28/2021 in [facility] Visit from 10/24/2018 in [facility] from 04/04/2018 in [facility] from 01/03/2018 in [facility] Total Score 0 0 5 0 0      GAD-7    Flowsheet Row Video Visit from 06/25/2021 in [facility] from 03/10/2021 in [facility]-  Total GAD-7 Score 17  12      PHQ2-9    [facility] Visit from 01/28/2021 in [facility] Video Visit from 12/16/2020 in [facility] Office Visit from 02/07/2020 in [facility] Visit from 12/19/2019 in [facility] Visit from 10/21/2018 in [facility]  PHQ-2 Total Score 6 5 0 0 4  PHQ-9 Total Score 15 20 -- -- 21      Flowsheet Row Video Visit from 06/04/2021 in [facility] Counselor from 03/10/2021 in [facility] Office Visit from 01/28/2021 in [facility] No Risk No Risk        Assessment and Plan: MIRTHA JAIN is a 55 year old Caucasian female, widowed, lives in [place], has a history of bipolar disorder, anxiety disorder, COPD, RLS, vitamin B12 deficiency was evaluated by telemedicine today.  Patient is currently struggling with grief as well as possible mixed episode of bipolar, has mood swings, excessive spending, anxiety, sleep problems as well as sadness.  She will benefit from the following plan.  Plan Bipolar disorder-unstable Discontinue Geodon-patient does not think it is beneficial. Start Zyprexa 2.5 mg p.o. nightly Discussed with patient to hold the trazodone for now since she is starting Zyprexa which can also help  with sleep. Lexapro 20 mg p.o. daily Continue benztropine 1 mg p.o. twice daily as needed for side effects of antipsychotic. AIMS - 0  GAD-unstable Continue CBT with Mr. Maye Hides Klonopin 0.5 mg up to 2-3 times a week only for severe panic attacks Lexapro 20 mg p.o. daily Hydroxyzine 50 mg p.o. twice daily as needed  Bereavement-unstable Provided grief counseling Continue CBT with Mr. Maye Hides  Memory problems-likely multifactorial-unstable Patient was recently started on vitamin B12 for vitamin B12  deficiency per primary care provider. Will monitor and reevaluate in future session. Patient will benefit from an MMSE Patient had neurology consultation.  At risk for prolonged QT syndrome-we will order EKG.  Provided phone #[phone] to schedule this appointment.  Follow-up in clinic in person in 1 to 2 weeks or sooner if needed.  Discussed with patient she needs to be compliant with her next visit and it has to be in person.  This note was generated in part or whole with voice recognition software. Voice recognition is usually quite accurate but there are transcription errors that can and very often do occur. I apologize for any typographical errors that were not detected and corrected.       Ursula Alert, MD 08/05/2021, 4:02 PM

## 2021-08-08 ENCOUNTER — Ambulatory Visit: Payer: Medicare HMO

## 2021-08-13 ENCOUNTER — Other Ambulatory Visit: Payer: Self-pay

## 2021-08-13 ENCOUNTER — Ambulatory Visit
Admission: RE | Admit: 2021-08-13 | Discharge: 2021-08-13 | Disposition: A | Discharge status: Home / Self Care | Payer: Medicare HMO | Source: Ambulatory Visit | Attending: Psychiatry | Admitting: Psychiatry

## 2021-08-13 ENCOUNTER — Other Ambulatory Visit: Payer: Self-pay | Admitting: Psychiatry

## 2021-08-13 DIAGNOSIS — Z9189 Other specified personal risk factors, not elsewhere classified: Secondary | ICD-10-CM | POA: Insufficient documentation

## 2021-08-13 DIAGNOSIS — F3162 Bipolar disorder, current episode mixed, moderate: Secondary | ICD-10-CM

## 2021-08-13 DIAGNOSIS — Z01818 Encounter for other preprocedural examination: Secondary | ICD-10-CM | POA: Diagnosis not present

## 2021-08-18 ENCOUNTER — Encounter: Payer: Self-pay | Admitting: Psychiatry

## 2021-08-18 ENCOUNTER — Ambulatory Visit (INDEPENDENT_AMBULATORY_CARE_PROVIDER_SITE_OTHER): Payer: Medicare HMO | Admitting: Psychiatry

## 2021-08-18 ENCOUNTER — Other Ambulatory Visit: Payer: Self-pay

## 2021-08-18 VITALS — BP 123/79 | HR 84 | Temp 98.7°F | Wt 182.8 lb

## 2021-08-18 DIAGNOSIS — R635 Abnormal weight gain: Secondary | ICD-10-CM | POA: Diagnosis not present

## 2021-08-18 DIAGNOSIS — Z634 Disappearance and death of family member: Secondary | ICD-10-CM

## 2021-08-18 DIAGNOSIS — G3184 Mild cognitive impairment, so stated: Secondary | ICD-10-CM | POA: Diagnosis not present

## 2021-08-18 DIAGNOSIS — F411 Generalized anxiety disorder: Secondary | ICD-10-CM | POA: Diagnosis not present

## 2021-08-18 DIAGNOSIS — F5105 Insomnia due to other mental disorder: Secondary | ICD-10-CM | POA: Diagnosis not present

## 2021-08-18 DIAGNOSIS — F3178 Bipolar disorder, in full remission, most recent episode mixed: Secondary | ICD-10-CM | POA: Diagnosis not present

## 2021-08-18 NOTE — Progress Notes (Signed)
[facility] MD OP Progress Note  08/18/2021 3:43 PM Stacy Chambers  MRN:  [id number]  Chief Complaint:  Chief Complaint   Follow-up; Depression    HPI: Stacy Chambers is a 55 year old Caucasian female, lives in [place], has a history of bipolar disorder, GAD, insomnia, chronic pain, neuroleptic induced akathisia., memory loss, vitamin B12 deficiency was evaluated in office today.  Patient today reports she is compliant on the olanzapine.  Does report possible weight gain from the medication.  She reports she has been watching what she eats however she continues to gain the weight.  She just started the olanzapine 2 weeks ago, unknown if she gained all this weight in 2 weeks or not.  She however is not interested in stopping the olanzapine yet and would like to give it a chance.  She reports sleep has improved on the olanzapine.  She continues to grieve the loss of her husband , sister ( died 2 weeks ago ) and other relatives who passed away recently.  There has been 5 deaths in the family recently.  She continues to follow-up with her therapist and reports therapy sessions are helpful.She has been keeping herself surrounded with friends and family.  Patient denies any suicidality, homicidality or perceptual disturbances.  Patient denies any other concerns today.  Visit Diagnosis:    ICD-10-CM   1. Bipolar disorder, in full remission, most recent episode mixed ([place])  F31.78     2. Generalized anxiety disorder  F41.1     3. Insomnia due to mental disorder  F51.05    grief, mood    4. Bereavement  Z63.4     5. MCI (mild cognitive impairment)  G31.84     6. Weight gain  R63.5    LIKELY MULTIFACTORIAL       Past Psychiatric History: Reviewed past psychiatric history from progress note on 10/12/2018.  Past trials of BuSpar, Zoloft, carbamazepine.  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   Chronic obstructive pulmonary disease ([place]) 10/03/2015   COPD (chronic obstructive pulmonary disease) (HCC)    no inhalers   Elevated liver function tests 11/26/2004   H/O NEGATIVE LIVER BIOPSY, workup negative   Gallstones 02/21/2015   GERD (gastroesophageal reflux disease)    Hyperlipidemia    Insomnia    Lower back pain    Osteoarthritis    Ovarian cancer ([place]) 1995   RLS (restless legs syndrome)    Sedative, hypnotic or anxiolytic dependence with withdrawal, 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[place])    Wears dentures    full upper and lower    Past Surgical History:  Procedure Laterality Date   ABDOMINAL HYSTERECTOMY     APPENDECTOMY     BREAST BIOPSY Left 03/12/2015   fat necrosis   CESAREAN SECTION     COLONOSCOPY WITH PROPOFOL N/A 05/30/2018   Procedure: COLONOSCOPY WITH PROPOFOL;  Surgeon: Manya Silvas, MD;  [facility] END[facility]COPY;  Service: Endoscopy;  Laterality: N/A;   EXCISION MORTON'S NEUROMA Left 10/07/2016   Procedure: EXCISION MORTON'S NEUROMA;  Surgeon: [name], DPM;  Location: [facility];  Service: Podiatry;  Laterality: Left;  IV WITH LOCAL   LIVER BIOPSY  2005   PerPt: Done to eval elevated LFTs and biopsy provided no definite dx/cause of elevated LFTs   MULTIPLE TOOTH EXTRACTIONS     OOPHORECTOMY     OSTECTOMY Right 04/10/2020   Procedure: DOUBLE OSTEOTOMY GENERAL W/ LOCAL;  Surgeon: [name], DPM;  Location: [facility];  Service: Podiatry;  Laterality: Right;    Family Psychiatric History: Reviewed family psychiatric history from progress note on 10/12/2018  Family History:  Family History  Adopted: Yes  Problem Relation Age of Onset   COPD Mother    Depression Mother    Colon cancer Mother        unsure of  age   40 Father        Lung CA, Skin Cancer--?type   COPD Father    Stroke Father    Cancer Sister 67       cervical cancer   COPD Brother    Alcohol abuse Brother    Stroke Brother    COPD Maternal Aunt    Depression Daughter    Schizophrenia Son    Breast cancer Neg Hx     Social History: Reviewed social history from progress note on 10/12/2018 Social History   Socioeconomic History   Marital status: Widowed    Spouse name: Not on file   Number of children: Not on file   Years of education: Not on file   Highest education level: Not on file  Occupational History   Not on file  Tobacco Use   Smoking status: Former    Packs/day: 1.00    Years: 30.00    Pack years: 30.00    Types: Cigarettes    Quit date: 04/08/2009    Years since quitting: 12.3   Smokeless tobacco: Never  Vaping Use   Vaping Use: Never used  Substance and Sexual Activity   Alcohol use: Yes    Alcohol/week: 3.0 - 7.0 standard drinks    Types: 3 - 7 Shots of liquor per week    Comment: occasionally   Drug use: Yes    Types: Marijuana    Comment: told to refrain until after surgery   Sexual activity: Yes    Birth control/protection: Surgical  Other Topics Concern   Not on file  Social History Narrative   Not on file   Social Determinants of Health   Financial Resource Strain: Low Risk    Difficulty of Paying Living Expenses: Not very hard  Food Insecurity: Not on file  Transportation Needs: Not on file  Physical Activity: Not on file  Stress: Not on file  Social Connections: Not on file    Allergies:  Allergies  Allergen Reactions   Benadryl [Diphenhydramine Hcl]     Hives/ rapid heartrate   Codeine     dizziness   Vicodin [Hydrocodone-Acetaminophen] Other (See Comments)    dizziness    Metabolic Disorder Labs: Lab Results  Component Value Date   HGBA1C 5.6 09/14/2018   MPG 114 09/14/2018   No results found for: PROLACTIN Lab Results  Component Value Date   CHOL 167  06/18/2020   TRIG 165 (H) 06/18/2020   HDL 54 06/18/2020   CHOLHDL 3.1 06/18/2020   VLDL 18 03/04/2015   LDLCALC 87 06/18/2020   LDLCALC 73 12/19/2019   Lab Results  Component Value Date   TSH 1.51 12/19/2019   TSH 2.320 10/24/2018    Therapeutic Level Labs: Lab Results  Component Value Date   LITHIUM 0.3 (L) 10/24/2018   LITHIUM 0.49 (L) 01/13/2009   No results found for: VALPROATE No components found for:  CBMZ  Current Medications: Current Outpatient Medications  Medication Sig Dispense Refill   alendronate (FOSAMAX) 70 MG tablet TAKE 1 TABLET (70 MG TOTAL) BY MOUTH ONCE A WEEK. TAKE WITH A FULL GLASS OF WATER ON AN EMPTY STOMACH. 12 tablet 3   ammonium lactate (AMLACTIN) 12 % cream APPLY TOPICALLY AS NEEDED FOR DRY SKIN 385 g 0   benztropine (COGENTIN) 1 MG tablet Take 1 tablet (1 mg total) by mouth 2 (two) times daily as needed for tremors. 180 tablet 0   budesonide-formoterol (SYMBICORT) 160-4.5 MCG/ACT inhaler Inhale 2 puffs into the lungs 2 (two) times daily. 3 each 3   clonazePAM (KLONOPIN) 0.5 MG tablet Take 1 tablet (0.5 mg total) by mouth as directed. Take 1 tablet 2-3 times a week for severe panic attacks only 13 tablet 0   escitalopram (LEXAPRO) 20 MG tablet Take 1 tablet (20 mg total) by mouth daily. 90 tablet 0   esomeprazole (NEXIUM) 20 MG capsule Take 1 capsule (20 mg total) by mouth daily as needed (take on empty stomach). 90 capsule 1   FLULAVAL QUADRIVALENT 0.5 ML injection      fluticasone (FLONASE) 50 MCG/ACT nasal spray Place 2 sprays into both nostrils daily. 16 mL 2   hydrochlorothiazide (HYDRODIURIL) 12.5 MG tablet      hydrOXYzine (ATARAX/VISTARIL) 50 MG tablet Take 1 tablet (50 mg total) by mouth 2 (two) times daily as needed. 180 tablet 0   lamoTRIgine (LAMICTAL) 150 MG tablet Take 0.5 tablets (75 mg total) by mouth 2 (two) times daily. 90 tablet 0   meloxicam (MOBIC) 7.5 MG tablet TAKE 1 TABLET TWICE DAILY AS NEEDED FOR PAIN 180 tablet 0    methocarbamol (ROBAXIN) 500 MG tablet      OLANZapine (ZYPREXA) 2.5 MG tablet TAKE 1 TABLET (2.5 MG TOTAL) BY MOUTH AT BEDTIME. STOP GEODON 90 tablet 0   rosuvastatin (CRESTOR) 10 MG tablet TAKE 1 TABLET EVERY DAY 90 tablet 3   No current facility-administered medications for this visit.     Musculoskeletal: Strength & Muscle Tone: within normal limits Gait & Station: normal Patient leans: N/A  Psychiatric Specialty Exam: Review of Systems  Psychiatric/Behavioral:  Negative for agitation, behavioral problems, confusion, decreased concentration, dysphoric mood, hallucinations, self-injury, sleep disturbance and suicidal ideas. The patient is not nervous/anxious and is not hyperactive.   All other systems reviewed and are negative.  Blood pressure 123/79, pulse 84, temperature 98.7 F (37.1 C), temperature source Temporal, weight 182 lb 12.8 oz (82.9 kg).Body mass index is 31.38 kg/m.  General Appearance: Casual  Eye Contact:  Fair  Speech:  Clear and Coherent  Volume:  Normal  Mood:  Euthymic  Affect:  Congruent  Thought Process:  Goal Directed and Descriptions of Associations: Intact  Orientation:  Full (Time, Place, and Person)  Thought Content: Logical   Suicidal Thoughts:  No  Homicidal Thoughts:  No  Memory:  Immediate;   Fair Recent;   Fair Remote;   Fair reports short term memory loss  Judgement:  Fair  Insight:  Fair  Psychomotor Activity:  Normal  Concentration:  Concentration: Fair and Attention Span: Fair  Recall:  [employer] of Knowledge: Fair  Language: Fair  Akathisia:  No  Handed:  Left  AIMS (if indicated): done  Assets:  Communication Skills Desire for [place]  ADL's:  Intact  Cognition: WNL  Sleep:  Fair   Screenings: AIMS    Flowsheet Row Video Visit from 06/04/2021 in [facility] Office Visit from 01/28/2021 in [facility] Office Visit from 10/24/2018 in [facility] Office Visit from 04/04/2018 in [place] Office Visit from 01/03/2018 in [place] [facility] Total Score 0 0 5 0 0      GAD-7    Flowsheet Row Video Visit from 06/25/2021 in [facility] Counselor from 03/10/2021 in [facility]  Total GAD-7 Score 17 12      PHQ2-9    [facility] Office Visit from 08/18/2021 in [facility] Office Visit from 01/28/2021 in [facility] Video Visit from 12/16/2020 in [facility] Office Visit from 02/07/2020 in [facility] Visit from 12/19/2019 in [facility]  PHQ-2 Total Score 3 6 5  0 0  PHQ-9 Total Score 10 15 20  -- --      [place] Visit from 08/18/2021 in [facility] Video Visit from 06/04/2021 in [facility] from 03/10/2021 in [facility]  C-SSRS RISK CATEGORY No Risk Low Risk No Risk        Assessment and Plan: Stacy Chambers is a 55 year old Caucasian female, widowed, lives in [place], has a history of bipolar disorder, anxiety disorder, COPD, RLS, vitamin B12 deficiency was evaluated in office today.  Patient is currently improving on the olanzapine although possibly has epworth side effects of weight gain however weight gain likely multifactorial.  She will benefit from the following plan.  Plan Bipolar disorder in remission Zyprexa 2.5 mg p.o. nightly Lexapro 20 mg p.o. daily BuSpar 1 mg p.o. twice daily for side effects of her antipsychotic. AIMS - 0  GAD-improving Continue CBT with Mr. Maye Hides Klonopin 0.5 mg up to 2-3 times a week only for severe panic attacks Lexapro 20 mg p.o. daily Hydroxyzine 50 mg p.o. twice daily as  needed  Bereavement-improving Continue CBT with her therapist  Memory problems likely multifactorial-improving Will monitor closely.  She had neurology consultation.  Weight gain likely multifactorial-unstable She is currently on a lower dosage of Zyprexa which is keeping her mood symptoms stable.  Long-term plan is to taper her off. She is interested in staying on this dose for now. Discussed referral to weight loss clinic.  She will have a discussion with primary care provider also. Discussed diet management, staying active.   Follow-up in clinic in 1 month or sooner in person.  This note was generated in part or whole with voice recognition software. Voice recognition is usually quite accurate but there are transcription errors that can and very often do occur. I apologize for any typographical errors that were not detected and corrected.      Ursula Alert, MD 08/19/2021, 8:50 AM

## 2021-08-19 DIAGNOSIS — G3184 Mild cognitive impairment, so stated: Secondary | ICD-10-CM | POA: Insufficient documentation

## 2021-08-19 DIAGNOSIS — R635 Abnormal weight gain: Secondary | ICD-10-CM | POA: Insufficient documentation

## 2021-08-19 DIAGNOSIS — E669 Obesity, unspecified: Secondary | ICD-10-CM | POA: Insufficient documentation

## 2021-08-25 ENCOUNTER — Ambulatory Visit (INDEPENDENT_AMBULATORY_CARE_PROVIDER_SITE_OTHER): Payer: Medicare HMO | Admitting: Clinical

## 2021-08-25 ENCOUNTER — Other Ambulatory Visit: Payer: Self-pay

## 2021-08-25 DIAGNOSIS — F411 Generalized anxiety disorder: Secondary | ICD-10-CM | POA: Diagnosis not present

## 2021-08-25 DIAGNOSIS — F5105 Insomnia due to other mental disorder: Secondary | ICD-10-CM

## 2021-08-25 DIAGNOSIS — F3131 Bipolar disorder, current episode depressed, mild: Secondary | ICD-10-CM

## 2021-08-25 DIAGNOSIS — R41840 Attention and concentration deficit: Secondary | ICD-10-CM | POA: Diagnosis not present

## 2021-08-25 NOTE — Progress Notes (Signed)
Virtual Visit via Telephone Note  I connected with Stacy Chambers on 08/25/21 at  8:00 AM EDT by telephone and verified that I am speaking with the correct person using two identifiers.  Location: Patient: Home  Provider: Office   I discussed the limitations, risks, security and privacy concerns of performing an evaluation and management service by telephone and the availability of in person appointments. I also discussed with the patient that there may be a patient responsible charge related to this service. The patient expressed understanding and agreed to proceed.  THERAPIST PROGRESS NOTE   Session Time: 8:00 AM-8:45AM   Participation Level: Active   Behavioral Response: CasualAlertDepressed   Type of Therapy: Individual Therapy   Treatment Goals addressed: Coping   Interventions: CBT   Summary: Stacy Chambers. Nilsen is a 55 y.o. female who presents with Bipolar Disorder./ GAD/Insomnia. The OPT therapist worked with the patient for her ongoing OPT treatment session. The OPT therapist utilized Motivational Interviewing to assist in creating therapeutic repore. The patient in the session was engaged and work in collaboration giving feedback about her triggers and symptoms over the past few weeks. The patient spoke about  losing another family member her sister passed away and spoke about having just her brother left in [place] as a support. The patient spoke about getting ready for holiday season and putting away Fall things and spoke about recently buying a new living room suite from [place]. The OPT therapist utilized Cognitive Behavioral Therapy through cognitive restructuring as well as worked with the patient on coping strategies to assist in management of mood. The patient continues to work through readjusting both mentally, financially, and emotionally room losing several people that are close to her over the past few months The patient spoke about going through so many family  losses and still feeling she is in shock and hasn't been able to process.The OPT therapist worked with the patient on continuing to maintain her basic needs of self care with eating, hygiene, and sleep. The patient spoke about her recent medication management visit with Dr. Shea Evans who continues to work with the patient Medication Management and encouraged the patient to continue her Outpatient Therapy.   Suicidal/Homicidal: Nowithout intent/plan   Therapist Response: The OPT therapist worked with the patient for the patients scheduled session. The patient was engaged in her session and gave feedback in relation to triggers, symptoms, and behavior responses over the past few weeks. The OPT therapist worked with the patient utilizing an in session Cognitive Behavioral Therapy exercise. The patient was responsive in the session and verbalized," I have to work on opening up about my feelings around the losses". The OPT therapist worked with the patient talking with her about the grieving process and working on implementing positive thinking, maintaining with basic self care. The patients psychiatrist has urged the patient to continue working in her therapy as a way to work through her grief from multiple family losses.  The patient spoke about her upcoming plans for Thanks giving and hoping her daughter will be able to come spend the holiday with her. The OPT therapist encouraged the patient to continue to use her existing support system (brother, daughter).The patient spoke about changes made with her Medication Therapy.The OPT therapist will continue treatment work with the patient in her next scheduled session   Plan: Return again in 3 weeks.   Diagnosis:      Axis I: Bipolar 1 disorder,depressed mild/GAD/Insomnia/ Attention deficit.  Axis II: No diagnosis   I discussed the assessment and treatment plan with the patient. The patient was provided an opportunity to ask questions  and all were answered. The patient agreed with the plan and demonstrated an understanding of the instructions.   The patient was advised to call back or seek an in-person evaluation if the symptoms worsen or if the condition fails to improve as anticipated.   I provided 45 minutes of non-face-to-face time during this encounter.   Stacy Grumbles, LCSW  08/25/2021

## 2021-08-27 ENCOUNTER — Other Ambulatory Visit: Payer: Self-pay | Admitting: Psychiatry

## 2021-08-27 DIAGNOSIS — F3131 Bipolar disorder, current episode depressed, mild: Secondary | ICD-10-CM

## 2021-08-27 DIAGNOSIS — F411 Generalized anxiety disorder: Secondary | ICD-10-CM

## 2021-09-07 ENCOUNTER — Other Ambulatory Visit: Payer: Self-pay | Admitting: Psychiatry

## 2021-09-13 ENCOUNTER — Other Ambulatory Visit: Payer: Self-pay | Admitting: Psychiatry

## 2021-09-13 DIAGNOSIS — Z634 Disappearance and death of family member: Secondary | ICD-10-CM

## 2021-09-15 ENCOUNTER — Telehealth: Payer: Self-pay

## 2021-09-16 ENCOUNTER — Other Ambulatory Visit: Payer: Self-pay

## 2021-09-16 ENCOUNTER — Ambulatory Visit (INDEPENDENT_AMBULATORY_CARE_PROVIDER_SITE_OTHER): Payer: Medicare HMO | Admitting: Clinical

## 2021-09-16 DIAGNOSIS — F5105 Insomnia due to other mental disorder: Secondary | ICD-10-CM | POA: Diagnosis not present

## 2021-09-16 DIAGNOSIS — R41840 Attention and concentration deficit: Secondary | ICD-10-CM | POA: Diagnosis not present

## 2021-09-16 DIAGNOSIS — F411 Generalized anxiety disorder: Secondary | ICD-10-CM

## 2021-09-16 DIAGNOSIS — F3131 Bipolar disorder, current episode depressed, mild: Secondary | ICD-10-CM | POA: Diagnosis not present

## 2021-09-16 NOTE — Progress Notes (Signed)
   Virtual Visit via Video Note  I connected with Stacy Chambers on 09/16/21 at  8:00 AM EST by a video enabled telemedicine application and verified that I am speaking with the correct person using two identifiers.  Location: Patient: Home Provider: Office   I discussed the limitations of evaluation and management by telemedicine and the availability of in person appointments. The patient expressed understanding and agreed to proceed.  THERAPIST PROGRESS NOTE   Session Time: 8:00 AM-8:30AM   Participation Level: Active   Behavioral Response: CasualAlertDepressed   Type of Therapy: Individual Therapy   Treatment Goals addressed: Coping   Interventions: CBT   Summary: Stacy Chambers is a 55 y.o. female who presents with Bipolar Disorder./ GAD/Insomnia. The OPT therapist worked with the patient for her ongoing OPT treatment session. The OPT therapist utilized Motivational Interviewing to assist in creating therapeutic repore. The patient in the session was engaged and work in collaboration giving feedback about her triggers and symptoms over the past few weeks. The patient spoke about  losing another family member her sister passed away and spoke about her brother moving in around the beginning of the year. The OPT therapist utilized Cognitive Behavioral Therapy through cognitive restructuring as well as worked with the patient on coping strategies to assist in management of mood. The OPT therapist worked with the patient on continuing to maintain her basic needs of self care with eating, hygiene, and sleep. The patient spoke about her recent medication management visit  upcoming with Dr. Shea Evans who continues to work with the patient Medication Management and encouraged the patient to continue her Outpatient Therapy.The patient identified feeling her medication needs adjusting but also acknowledged the holidays as a trigger.   Suicidal/Homicidal: Nowithout intent/plan   Therapist  Response: The OPT therapist worked with the patient for the patients scheduled session. The patient was engaged in her session and gave feedback in relation to triggers, symptoms, and behavior responses over the past few weeks. The OPT therapist worked with the patient utilizing an in session Cognitive Behavioral Therapy exercise. The patient was responsive in the session and verbalized," I got together with family and we did a dinner so that kind of served as a early Thanksgiving already". The OPT therapist encouraged the patient to continue to use her existing support systems (brother, daughter).The OPT therapist will continue treatment work with the patient in her next scheduled session   Plan: Return again in 3 weeks.   Diagnosis:      Axis I: Bipolar 1 disorder,depressed mild/GAD/Insomnia/ Attention deficit.                             Axis II: No diagnosis   I discussed the assessment and treatment plan with the patient. The patient was provided an opportunity to ask questions and all were answered. The patient agreed with the plan and demonstrated an understanding of the instructions.   The patient was advised to call back or seek an in-person evaluation if the symptoms worsen or if the condition fails to improve as anticipated.   I provided 30 minutes of non-face-to-face time during this encounter.   [name], LCSW   09/16/2021

## 2021-09-17 ENCOUNTER — Other Ambulatory Visit: Payer: Self-pay

## 2021-09-17 ENCOUNTER — Ambulatory Visit (INDEPENDENT_AMBULATORY_CARE_PROVIDER_SITE_OTHER): Payer: Medicare HMO | Admitting: Psychiatry

## 2021-09-17 ENCOUNTER — Encounter: Payer: Self-pay | Admitting: Psychiatry

## 2021-09-17 VITALS — BP 114/76 | HR 67 | Temp 97.8°F | Wt 180.8 lb

## 2021-09-17 DIAGNOSIS — Z79899 Other long term (current) drug therapy: Secondary | ICD-10-CM | POA: Diagnosis not present

## 2021-09-17 DIAGNOSIS — F411 Generalized anxiety disorder: Secondary | ICD-10-CM

## 2021-09-17 DIAGNOSIS — G3184 Mild cognitive impairment, so stated: Secondary | ICD-10-CM

## 2021-09-17 DIAGNOSIS — F5105 Insomnia due to other mental disorder: Secondary | ICD-10-CM | POA: Diagnosis not present

## 2021-09-17 DIAGNOSIS — Z634 Disappearance and death of family member: Secondary | ICD-10-CM

## 2021-09-17 DIAGNOSIS — G2581 Restless legs syndrome: Secondary | ICD-10-CM | POA: Diagnosis not present

## 2021-09-17 DIAGNOSIS — R635 Abnormal weight gain: Secondary | ICD-10-CM

## 2021-09-17 DIAGNOSIS — F3162 Bipolar disorder, current episode mixed, moderate: Secondary | ICD-10-CM | POA: Diagnosis not present

## 2021-09-17 MED ORDER — DIVALPROEX SODIUM ER 500 MG PO TB24: 500.0000 mg | ORAL_TABLET | Freq: Every day | ORAL | 1 refills | Status: DC

## 2021-09-17 MED ORDER — LAMOTRIGINE 150 MG PO TABS: 75.0000 mg | ORAL_TABLET | Freq: Every day | ORAL | 0 refills | Status: DC

## 2021-09-17 MED ORDER — BENZTROPINE MESYLATE 1 MG PO TABS: 1.0000 mg | ORAL_TABLET | Freq: Three times a day (TID) | ORAL | 0 refills | Status: DC | PRN

## 2021-09-17 NOTE — Progress Notes (Signed)
[facility] MD OP Progress Note  09/17/2021 10:33 AM Stacy Chambers  MRN:  [id number]  Chief Complaint:  Chief Complaint   Follow-up; Depression; Anxiety    HPI: Stacy Chambers is a 55 year old Caucasian female, lives in [place], has a history of bipolar disorder, GAD, insomnia, bereavement, MCI, weight gain, memory loss, vitamin B12 deficiency was evaluated in office today.  Patient today reports her depressive symptoms as worsening.  She has been having a lot of crying spells lately.  Patient also reports anxiety, she worries a lot about different things.  Patient reports although it is Thanksgiving day tomorrow she has not planned anything for herself.  Her brother is going to be busy with his girlfriend.  Patient reports although she is going to cook [place], she is staying home alone.  She does have company from her four pet dogs.  That helps.  Patient continues to grieve the loss of her husband.  She is taking it one day at a time.  Patient reports possible hallucinations of her husband trying to communicate with her by switching the TV on or other noises in the house.  She however reports it does not bother her much.  It does not happen all the time.  She believes her dogs could hear it too.  Patient reports sleep is overall okay.  She does take olanzapine for her mood and sleep.  Patient continues to struggle with restless leg symptoms, especially in the afternoon.  She has been taking benztropine 1 mg in the morning and at night which helps.  Patient denies any suicidality or homicidality.  Patient denies any other concerns today.   Visit Diagnosis:    ICD-10-CM   1. Bipolar 1 disorder, mixed, moderate (HCC)  F31.62 Valproic acid level    Platelet count    Sodium    Hepatic function panel    divalproex (DEPAKOTE ER) 500 MG 24 hr tablet    2. GAD (generalized anxiety disorder)  F41.1 lamoTRIgine (LAMICTAL) 150 MG tablet    3. Insomnia due to mental disorder  F51.05 benztropine  (COGENTIN) 1 MG tablet   Grief, mood    4. Bereavement  Z63.4     5. Restless legs syndrome (RLS)  G25.81     6. MCI (mild cognitive impairment)  G31.84     7. Weight gain  R63.5    mulifactorial    8. High risk medication use  Z79.899 Valproic acid level    Platelet count    Sodium    Hepatic function panel      Past Psychiatric History: Reviewed past psychiatric history from progress note on 10/12/2018.  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   Chronic obstructive pulmonary disease ([place]) 10/03/2015   COPD (chronic obstructive pulmonary disease) ([place])    no inhalers   Elevated liver function tests 11/26/2004   H/O NEGATIVE LIVER BIOPSY, workup negative   Gallstones 02/21/2015   GERD (gastroesophageal reflux disease)    Hyperlipidemia    Insomnia    Lower back pain    Osteoarthritis  Ovarian cancer ([facility]) 1995   RLS (restless legs syndrome)    Sedative, hypnotic or anxiolytic dependence with withdrawal, 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[place])    Wears dentures    full upper and lower    Past Surgical History:  Procedure Laterality Date   ABDOMINAL HYSTERECTOMY     APPENDECTOMY     BREAST BIOPSY Left 03/12/2015   fat necrosis   CESAREAN SECTION     COLONOSCOPY WITH PROPOFOL N/A 05/30/2018   Procedure: COLONOSCOPY WITH PROPOFOL;  Surgeon: Manya Silvas, MD;  Location: [facility] ENDOSCOPY;  Service: Endoscopy;  Laterality: N/A;   EXCISION MORTON'S NEUROMA Left 10/07/2016   Procedure: EXCISION MORTON'S NEUROMA;  Surgeon: [name], DPM;  Location: [facility];  Service: Podiatry;  Laterality: Left;  IV WITH  LOCAL   LIVER BIOPSY  2005   PerPt: Done to eval elevated LFTs and biopsy provided no definite dx/cause of elevated LFTs   MULTIPLE TOOTH EXTRACTIONS     OOPHORECTOMY     OSTECTOMY Right 04/10/2020   Procedure: DOUBLE OSTEOTOMY GENERAL W/ LOCAL;  Surgeon: [name], DPM;  Location: [facility];  Service: Podiatry;  Laterality: Right;    Family Psychiatric History: Reviewed family psychiatric history from progress note on 10/12/2018  Family History:  Family History  Adopted: Yes  Problem Relation Age of Onset   COPD Mother    Depression Mother    Colon cancer Mother        unsure of age   75 Father        Lung CA, Skin Cancer--?type   COPD Father    Stroke Father    Cancer Sister 54       cervical cancer   COPD Brother    Alcohol abuse Brother    Stroke Brother    COPD Maternal Aunt    Depression Daughter    Schizophrenia Son    Breast cancer Neg Hx     Social History: Reviewed social history from progress note on 10/12/2018 Social History   Socioeconomic History   Marital status: Widowed    Spouse name: Not on file   Number of children: Not on file   Years of education: Not on file   Highest education level: Not on file  Occupational History   Not on file  Tobacco Use   Smoking status: Former    Packs/day: 1.00    Years: 30.00    Pack years: 30.00    Types: Cigarettes    Quit date: 04/08/2009    Years since quitting: 12.4   Smokeless tobacco: Never  Vaping Use   Vaping Use: Never used  Substance and Sexual Activity   Alcohol use: Yes    Alcohol/week: 3.0 - 7.0 standard drinks    Types: 3 - 7 Shots of liquor per week    Comment: occasionally   Drug use: Yes    Types: Marijuana    Comment: told to refrain until after surgery   Sexual activity: Yes    Birth control/protection: Surgical  Other Topics Concern   Not on file  Social History Narrative   Not on file   Social Determinants of Health   Financial Resource Strain: Low Risk     Difficulty of Paying Living Expenses: Not very hard  Food Insecurity: Not on file  Transportation Needs: Not on file  Physical Activity: Not on file  Stress: Not on file  Social Connections: Not on file    Allergies:  Allergies  Allergen Reactions  Benadryl [Diphenhydramine Hcl]     Hives/ rapid heartrate   Codeine     dizziness   Vicodin [Hydrocodone-Acetaminophen] Other (See Comments)    dizziness    Metabolic Disorder Labs: Lab Results  Component Value Date   HGBA1C 5.6 09/14/2018   MPG 114 09/14/2018   No results found for: PROLACTIN Lab Results  Component Value Date   CHOL 167 06/18/2020   TRIG 165 (H) 06/18/2020   HDL 54 06/18/2020   CHOLHDL 3.1 06/18/2020   VLDL 18 03/04/2015   LDLCALC 87 06/18/2020   LDLCALC 73 12/19/2019   Lab Results  Component Value Date   TSH 1.51 12/19/2019   TSH 2.320 10/24/2018    Therapeutic Level Labs: Lab Results  Component Value Date   LITHIUM 0.3 (L) 10/24/2018   LITHIUM 0.49 (L) 01/13/2009   No results found for: VALPROATE No components found for:  CBMZ  Current Medications: Current Outpatient Medications  Medication Sig Dispense Refill   alendronate (FOSAMAX) 70 MG tablet TAKE 1 TABLET (70 MG TOTAL) BY MOUTH ONCE A WEEK. TAKE WITH A FULL GLASS OF WATER ON AN EMPTY STOMACH. 12 tablet 3   ammonium lactate (AMLACTIN) 12 % cream APPLY TOPICALLY AS NEEDED FOR DRY SKIN 385 g 0   benztropine (COGENTIN) 1 MG tablet Take 1 tablet (1 mg total) by mouth 3 (three) times daily as needed for tremors. 270 tablet 0   budesonide-formoterol (SYMBICORT) 160-4.5 MCG/ACT inhaler Inhale 2 puffs into the lungs 2 (two) times daily. 3 each 3   clonazePAM (KLONOPIN) 0.5 MG tablet TAKE 1 TABLET 2-3 TIMES A WEEK AS DIRECTED FOR SEVERE PANIC ATTACKS ONLY 13 tablet 0   divalproex (DEPAKOTE ER) 500 MG 24 hr tablet Take 1 tablet (500 mg total) by mouth at bedtime. 30 tablet 1   escitalopram (LEXAPRO) 20 MG tablet TAKE 1 TABLET EVERY DAY (DOSE  INCREASE) 90 tablet 0   esomeprazole (NEXIUM) 20 MG capsule Take 1 capsule (20 mg total) by mouth daily as needed (take on empty stomach). 90 capsule 1   FLULAVAL QUADRIVALENT 0.5 ML injection      fluticasone (FLONASE) 50 MCG/ACT nasal spray Place 2 sprays into both nostrils daily. 16 mL 2   hydrochlorothiazide (HYDRODIURIL) 12.5 MG tablet      hydrOXYzine (ATARAX/VISTARIL) 50 MG tablet TAKE 1 TABLET TWICE DAILY AS NEEDED FOR SEVERE ANXIETY ATTACKS 180 tablet 0   lamoTRIgine (LAMICTAL) 150 MG tablet Take 0.5 tablets (75 mg total) by mouth daily. STOP TAKING AFTER 3 DAYS 5 tablet 0   meloxicam (MOBIC) 7.5 MG tablet TAKE 1 TABLET TWICE DAILY AS NEEDED FOR PAIN 180 tablet 0   methocarbamol (ROBAXIN) 500 MG tablet      OLANZapine (ZYPREXA) 2.5 MG tablet TAKE 1 TABLET (2.5 MG TOTAL) BY MOUTH AT BEDTIME. STOP GEODON 90 tablet 0   rosuvastatin (CRESTOR) 10 MG tablet TAKE 1 TABLET EVERY DAY 90 tablet 3   No current facility-administered medications for this visit.     Musculoskeletal: Strength & Muscle Tone:  WNL Gait & Station: normal Patient leans: N/A  Psychiatric Specialty Exam: Review of Systems  Psychiatric/Behavioral:  Positive for decreased concentration and dysphoric mood. The patient is nervous/anxious.   All other systems reviewed and are negative.  Blood pressure 114/76, pulse 67, temperature 97.8 F (36.6 C), temperature source Temporal, weight 180 lb 12.8 oz (82 kg).Body mass index is 31.03 kg/m.  General Appearance: Fairly Groomed  Eye Contact:  Fair  Speech:  Clear and Coherent  Volume:  Normal  Mood:  Anxious and Depressed  Affect:  Full Range  Thought Process:  Goal Directed and Descriptions of Associations: Intact  Orientation:  Full (Time, Place, and Person)  Thought Content:  Does report perceptual disturbances as noted above, likely due to grief, currently does not bother her    Suicidal Thoughts:  No  Homicidal Thoughts:  No  Memory:  Immediate;   Fair Recent;    Fair Remote;   Fair reports short term memory problems   Judgement:  Fair  Insight:  Fair  Psychomotor Activity:  Normal  Concentration:  Concentration: Fair and Attention Span: Fair  Recall:  [employer] of Knowledge: Fair  Language: Fair  Akathisia:  No  Handed:  Left  AIMS (if indicated): done, 0  Assets:  Communication Skills Desire for Improvement Housing Social Support  ADL's:  Intact  Cognition: WNL  Sleep:  Fair   Screenings: AIMS    Flowsheet Row Video Visit from 06/04/2021 in [facility] Visit from 01/28/2021 in [facility] Visit from 10/24/2018 in [facility] from 04/04/2018 in [facility] Visit from 01/03/2018 in [facility] Total Score 0 0 5 0 0      GAD-7    Flowsheet Row Video Visit from 06/25/2021 in [facility] from 03/10/2021 in [facility]  Total GAD-7 Score 17 12      PHQ2-9    [facility] Visit from 08/18/2021 in [facility] Visit from 01/28/2021 in [facility] Video Visit from 12/16/2020 in [facility] Office Visit from 02/07/2020 in [facility] Visit from 12/19/2019 in [facility]  PHQ-2 Total Score 3 6 5  0 0  PHQ-9 Total Score 10 15 20  -- --      [facility] Office Visit from 08/18/2021 in [facility] Video Visit from 06/04/2021 in [facility] Counselor from 03/10/2021 in [facility]  C-SSRS RISK CATEGORY No Risk Low Risk No Risk        Assessment and Plan: Stacy Chambers is a 55 year old Caucasian female, widowed, lives in [place], has a history  of bipolar disorder, anxiety disorder, COPD, RLS, vitamin B12 deficiency was evaluated in office today.  Patient is currently struggling with depression, anxiety, crying spells, will benefit from medication readjustment.  Plan Bipolar disorder mixed, moderate-unstable Zyprexa 2.5 mg p.o. nightly Lexapro 20 mg p.o. daily Taper of Lamictal.  Patient advised to take 75 mg daily for 3 days and stop taking it. Start Depakote ER 500 mg p.o. nightly  GAD-unstable Start Depakote ER 500 mg p.o. nightly Continue CBT with Mr. Maye Hides Klonopin 0.5 mg up to 2-3 times a week only for severe panic attacks Lexapro 20 mg p.o. daily Hydroxyzine 50 mg p.o. twice daily as needed  Bereavement-unstable Continue CBT with therapist  Restless leg syndrome-unknown if due to her psychotropics-unstable Increase benztropine to 1 mg p.o. 3 times daily.  Provided education, patient advised to limit use  Weight gain likely multifactorial-unstable Patient will continue to stay active, watch her diet. Patient is aware that she is on multiple psychotropic medications which could contribute to weight gain.  High risk medication use-will order Depakote level, sodium, hepatic function, platelet count.  Patient to go to [facility] 4 days after starting the medication Depakote,  first thing in the morning.  Memory problems-likely multifactorial-improving Patient had neurology consultation, currently improving.  Follow-up in clinic in 2 to 3 weeks or sooner in person.  This note was generated in part or whole with voice recognition software. Voice recognition is usually quite accurate but there are transcription errors that can and very often do occur. I apologize for any typographical errors that were not detected and corrected.         Ursula Alert, MD 09/17/2021, 10:33 AM

## 2021-09-17 NOTE — Patient Instructions (Signed)
Valproic Acid, Divalproex Sodium delayed or extended-release tablets What is this medication? DIVALPROEX SODIUM (dye VAL pro ex  SO dee um) is used to prevent seizures caused by some forms of epilepsy. It is also used to treat bipolar mania and to prevent migraine headaches. This medicine may be used for other purposes; ask your health care provider or pharmacist if you have questions. COMMON BRAND NAME(S): Depakote, Depakote ER What should I tell my care team before I take this medication? They need to know if you have any of these conditions: if you often drink alcohol kidney disease liver disease low platelet counts mitochondrial disease suicidal thoughts, plans, or attempt; a previous suicide attempt by you or a family member urea cycle disorder (UCD) an unusual or allergic reaction to divalproex sodium, sodium valproate, valproic acid, other medicines, foods, dyes, or preservatives pregnant or trying to get pregnant breast-feeding How should I use this medication? Take this medicine by mouth with a drink of water. Follow the directions on the prescription label. Do not cut, crush or chew this medicine. You can take it with or without food. If it upsets your stomach, take it with food. Take your medicine at regular intervals. Do not take it more often than directed. Do not stop taking except on your doctor's advice. A special MedGuide will be given to you by the pharmacist with each prescription and refill. Be sure to read this information carefully each time. Talk to your pediatrician regarding the use of this medicine in children. While this drug may be prescribed for children as young as 10 years for selected conditions, precautions do apply. Overdosage: If you think you have taken too much of this medicine contact a poison control center or emergency room at once. NOTE: This medicine is only for you. Do not share this medicine with others. What if I miss a dose? If you miss a dose,  take it as soon as you can. If it is almost time for your next dose, take only that dose. Do not take double or extra doses. What may interact with this medication? Do not take this medicine with any of the following medications: sodium phenylbutyrate This medicine may also interact with the following medications: aspirin certain antibiotics like ertapenem, imipenem, meropenem certain medicines for depression, anxiety, or psychotic disturbances certain medicines for seizures like carbamazepine, clonazepam, diazepam, ethosuximide, felbamate, lamotrigine, phenobarbital, phenytoin, primidone, rufinamide, topiramate certain medicines that treat or prevent blood clots like warfarin cholestyramine female hormones, like estrogens and birth control pills, patches, or rings propofol rifampin ritonavir tolbutamide zidovudine This list may not describe all possible interactions. Give your health care provider a list of all the medicines, herbs, non-prescription drugs, or dietary supplements you use. Also tell them if you smoke, drink alcohol, or use illegal drugs. Some items may interact with your medicine. What should I watch for while using this medication? Tell your doctor or health care provider if your symptoms do not get better or they start to get worse. This medicine may cause serious skin reactions. They can happen weeks to months after starting the medicine. Contact your health care provider right away if you notice fevers or flu-like symptoms with a rash. The rash may be red or purple and then turn into blisters or peeling of the skin. Or, you might notice a red rash with swelling of the face, lips or lymph nodes in your neck or under your arms. Wear a medical ID bracelet or chain, and carry a card  that describes your disease and details of your medicine and dosage times. You may get drowsy, dizzy, or have blurred vision. Do not drive, use machinery, or do anything that needs mental alertness  until you know how this medicine affects you. To reduce dizzy or fainting spells, do not sit or stand up quickly, especially if you are an older patient. Alcohol can increase drowsiness and dizziness. Avoid alcoholic drinks. This medicine can make you more sensitive to the sun. Keep out of the sun. If you cannot avoid being in the sun, wear protective clothing and use sunscreen. Do not use sun lamps or tanning beds/booths. Patients and their families should watch out for new or worsening depression or thoughts of suicide. Also watch out for sudden changes in feelings such as feeling anxious, agitated, panicky, irritable, hostile, aggressive, impulsive, severely restless, overly excited and hyperactive, or not being able to sleep. If this happens, especially at the beginning of treatment or after a change in dose, call your health care provider. Women should inform their doctor if they wish to become pregnant or think they might be pregnant. There is a potential for serious side effects to an unborn child. Talk to your health care provider or pharmacist for more information. Women who become pregnant while using this medicine may enroll in the [place] Pregnancy Registry by calling [phone]. This registry collects information about the safety of antiepileptic drug use during pregnancy. This medicine may cause a decrease in folic acid and vitamin D. You should make sure that you get enough vitamins while you are taking this medicine. Discuss the foods you eat and the vitamins you take with your health care provider. What side effects may I notice from receiving this medication? Side effects that you should report to your doctor or health care professional as soon as possible: allergic reactions like skin rash, itching or hives, swelling of the face, lips, or tongue changes in vision rash, fever, and swollen lymph nodes redness, blistering, peeling or loosening of the skin,  including inside the mouth signs and symptoms of liver injury like dark yellow or brown urine; general ill feeling or flu-like symptoms; light-colored stools; loss of appetite; nausea; right upper belly pain; unusually weak or tired; yellowing of the eyes or skin suicidal thoughts or other mood changes unusual bleeding or bruising Side effects that usually do not require medical attention (report to your doctor or health care professional if they continue or are bothersome): constipation diarrhea dizziness hair loss headache loss of appetite weight gain This list may not describe all possible side effects. Call your doctor for medical advice about side effects. You may report side effects to FDA at 1-800-FDA-1088. Where should I keep my medication? Keep out of reach of children. Store at room temperature between 15 and 30 degrees C (59 and 86 degrees F). Keep container tightly closed. Throw away any unused medicine after the expiration date. NOTE: This sheet is a summary. It may not cover all possible information. If you have questions about this medicine, talk to your doctor, pharmacist, or health care provider.  2022 Elsevier/Gold Standard (2019-01-25 00:00:00)

## 2021-09-22 ENCOUNTER — Other Ambulatory Visit: Payer: Self-pay

## 2021-09-22 ENCOUNTER — Other Ambulatory Visit
Admission: RE | Admit: 2021-09-22 | Discharge: 2021-09-22 | Disposition: A | Discharge status: Home / Self Care | Payer: Medicare HMO | Attending: Psychiatry | Admitting: Psychiatry

## 2021-09-22 DIAGNOSIS — F3162 Bipolar disorder, current episode mixed, moderate: Secondary | ICD-10-CM | POA: Insufficient documentation

## 2021-09-22 DIAGNOSIS — Z79899 Other long term (current) drug therapy: Secondary | ICD-10-CM | POA: Diagnosis not present

## 2021-09-22 LAB — PLATELET COUNT: Platelets: 184 10*3/uL (ref 150–400)

## 2021-09-22 LAB — HEPATIC FUNCTION PANEL
ALT: 46 U/L — ABNORMAL HIGH (ref 0–44)
AST: 34 U/L (ref 15–41)
Albumin: 4.3 g/dL (ref 3.5–5.0)
Alkaline Phosphatase: 109 U/L (ref 38–126)
Bilirubin, Direct: <0.1 mg/dL (ref 0.0–0.2)
Total Bilirubin: 0.6 mg/dL (ref 0.3–1.2)
Total Protein: 7 g/dL (ref 6.5–8.1)

## 2021-09-22 LAB — VALPROIC ACID LEVEL: Valproic Acid Lvl: 27 ug/mL — ABNORMAL LOW (ref 50.0–100.0)

## 2021-09-22 LAB — SODIUM: Sodium: 138 mmol/L (ref 135–145)

## 2021-09-24 ENCOUNTER — Telehealth: Payer: Self-pay | Admitting: Psychiatry

## 2021-09-24 ENCOUNTER — Other Ambulatory Visit: Payer: Self-pay | Admitting: Psychiatry

## 2021-09-24 DIAGNOSIS — F3162 Bipolar disorder, current episode mixed, moderate: Secondary | ICD-10-CM

## 2021-09-24 DIAGNOSIS — Z79899 Other long term (current) drug therapy: Secondary | ICD-10-CM

## 2021-09-24 MED ORDER — DIVALPROEX SODIUM ER 500 MG PO TB24: 1000.0000 mg | ORAL_TABLET | Freq: Every day | ORAL | 1 refills | Status: DC

## 2021-09-24 NOTE — Telephone Encounter (Signed)
Returned call to patient to discuss her Depakote level being subtherapeutic.  Most recent ALT slightly high at normal at 46.  However that has been chronic and likely not due to Depakote.  Discussed the same with patient.  Patient agreeable to increasing the Depakote dose.  We will start Depakote 1000 mg p.o. daily. I have sent a prescription to the pharmacy-[facility].  Will order Depakote level again-patient agrees to go to [facility] lab 5 days after starting this new dose, first thing in the morning.  I have put the order in the system for this patient.  Crisis plan discussed with patient.  Patient did talk to her brother, go to the nearest urgent care or emergency department if in a crisis.

## 2021-09-26 ENCOUNTER — Other Ambulatory Visit: Payer: Self-pay | Admitting: Psychiatry

## 2021-09-30 ENCOUNTER — Other Ambulatory Visit
Admission: RE | Admit: 2021-09-30 | Discharge: 2021-09-30 | Disposition: A | Discharge status: Home / Self Care | Payer: Medicare HMO | Attending: Psychiatry | Admitting: Psychiatry

## 2021-09-30 DIAGNOSIS — Z79899 Other long term (current) drug therapy: Secondary | ICD-10-CM | POA: Insufficient documentation

## 2021-09-30 DIAGNOSIS — F3162 Bipolar disorder, current episode mixed, moderate: Secondary | ICD-10-CM | POA: Insufficient documentation

## 2021-09-30 LAB — HEPATIC FUNCTION PANEL
ALT: 25 U/L (ref 0–44)
AST: 24 U/L (ref 15–41)
Albumin: 4.4 g/dL (ref 3.5–5.0)
Alkaline Phosphatase: 101 U/L (ref 38–126)
Bilirubin, Direct: <0.1 mg/dL (ref 0.0–0.2)
Total Bilirubin: 0.6 mg/dL (ref 0.3–1.2)
Total Protein: 7.5 g/dL (ref 6.5–8.1)

## 2021-10-01 ENCOUNTER — Other Ambulatory Visit: Payer: Self-pay | Admitting: Psychiatry

## 2021-10-01 ENCOUNTER — Telehealth: Payer: Self-pay | Admitting: Psychiatry

## 2021-10-01 NOTE — Telephone Encounter (Signed)
Discussed with patient valproic acid level which was ordered was never repeated.  Her hepatic function was the only 1 that resulted.  Patient currently denies any side effects and would like to wait until her next appointment in a week from now to repeat valproic acid level. We will continue to monitor hepatic function.

## 2021-10-08 ENCOUNTER — Encounter: Payer: Self-pay | Admitting: Psychiatry

## 2021-10-08 ENCOUNTER — Ambulatory Visit (INDEPENDENT_AMBULATORY_CARE_PROVIDER_SITE_OTHER): Payer: Medicare HMO | Admitting: Psychiatry

## 2021-10-08 ENCOUNTER — Other Ambulatory Visit
Admission: RE | Admit: 2021-10-08 | Discharge: 2021-10-08 | Disposition: A | Discharge status: Home / Self Care | Payer: Medicare HMO | Source: Ambulatory Visit | Attending: Psychiatry | Admitting: Psychiatry

## 2021-10-08 ENCOUNTER — Other Ambulatory Visit: Payer: Self-pay

## 2021-10-08 VITALS — BP 113/80 | HR 68 | Temp 98.5°F | Wt 183.6 lb

## 2021-10-08 DIAGNOSIS — G2581 Restless legs syndrome: Secondary | ICD-10-CM | POA: Diagnosis not present

## 2021-10-08 DIAGNOSIS — Z79899 Other long term (current) drug therapy: Secondary | ICD-10-CM | POA: Insufficient documentation

## 2021-10-08 DIAGNOSIS — Z634 Disappearance and death of family member: Secondary | ICD-10-CM | POA: Diagnosis not present

## 2021-10-08 DIAGNOSIS — F5105 Insomnia due to other mental disorder: Secondary | ICD-10-CM

## 2021-10-08 DIAGNOSIS — F3162 Bipolar disorder, current episode mixed, moderate: Secondary | ICD-10-CM | POA: Diagnosis not present

## 2021-10-08 DIAGNOSIS — G3184 Mild cognitive impairment, so stated: Secondary | ICD-10-CM

## 2021-10-08 DIAGNOSIS — Z5181 Encounter for therapeutic drug level monitoring: Secondary | ICD-10-CM | POA: Insufficient documentation

## 2021-10-08 DIAGNOSIS — F411 Generalized anxiety disorder: Secondary | ICD-10-CM

## 2021-10-08 DIAGNOSIS — R635 Abnormal weight gain: Secondary | ICD-10-CM

## 2021-10-08 LAB — VALPROIC ACID LEVEL: Valproic Acid Lvl: 73 ug/mL (ref 50.0–100.0)

## 2021-10-08 MED ORDER — ESCITALOPRAM OXALATE 20 MG PO TABS
10.0000 mg | ORAL_TABLET | Freq: Every day | ORAL | 0 refills | Status: DC
Start: 2021-10-08 — End: 2021-11-12

## 2021-10-08 MED ORDER — OLANZAPINE 2.5 MG PO TABS: 2.5000 mg | ORAL_TABLET | Freq: Every evening | ORAL | 0 refills | Status: DC | PRN

## 2021-10-08 MED ORDER — BUPROPION HCL ER (XL) 150 MG PO TB24: 150.0000 mg | ORAL_TABLET | Freq: Every day | ORAL | 0 refills | Status: DC

## 2021-10-08 NOTE — Patient Instructions (Signed)
Bupropion Extended-Release Tablets (Depression/Mood Disorders) What is this medication? BUPROPION (byoo PROE pee on) treats depression. It increases norepinephrine and dopamine in the brain, hormones that help regulate mood. It belongs to a group of medications called NDRIs. This medicine may be used for other purposes; ask your health care provider or pharmacist if you have questions. COMMON BRAND NAME(S): Aplenzin, Budeprion XL, Forfivo XL, Wellbutrin XL What should I tell my care team before I take this medication? They need to know if you have any of these conditions: An eating disorder, such as anorexia or bulimia Bipolar disorder or psychosis Diabetes or high blood sugar, treated with medication Glaucoma Head injury or brain tumor Heart disease, previous heart attack, or irregular heart beat High blood pressure Kidney or liver disease Seizures (convulsions) Suicidal thoughts or a previous suicide attempt Tourette's syndrome Weight loss An unusual or allergic reaction to bupropion, other medications, foods, dyes, or preservatives Pregnant or trying to become pregnant Breast-feeding How should I use this medication? Take this medication by mouth with a glass of water. Follow the directions on the prescription label. You can take it with or without food. If it upsets your stomach, take it with food. Do not crush, chew, or cut these tablets. This medication is taken once daily at the same time each day. Do not take your medication more often than directed. Do not stop taking this medication suddenly except upon the advice of your care team. Stopping this medication too quickly may cause serious side effects or your condition may worsen. A special MedGuide will be given to you by the pharmacist with each prescription and refill. Be sure to read this information carefully each time. Talk to your care team about the use of this medication in children. Special care may be needed. Overdosage:  If you think you have taken too much of this medicine contact a poison control center or emergency room at once. NOTE: This medicine is only for you. Do not share this medicine with others. What if I miss a dose? If you miss a dose, skip the missed dose and take your next tablet at the regular time. Do not take double or extra doses. What may interact with this medication? Do not take this medication with any of the following: Linezolid MAOIs like Azilect, Carbex, Eldepryl, Marplan, Nardil, and Parnate Methylene blue (injected into a vein) Other medications that contain bupropion like Zyban This medication may also interact with the following: Alcohol Certain medications for anxiety or sleep Certain medications for blood pressure like metoprolol, propranolol Certain medications for depression or psychotic disturbances Certain medications for HIV or AIDS like efavirenz, lopinavir, nelfinavir, ritonavir Certain medications for irregular heart beat like propafenone, flecainide Certain medications for Parkinson's disease like amantadine, levodopa Certain medications for seizures like carbamazepine, phenytoin, phenobarbital Cimetidine Clopidogrel Cyclophosphamide Digoxin Furazolidone Isoniazid Nicotine Orphenadrine Procarbazine Steroid medications like prednisone or cortisone Stimulant medications for attention disorders, weight loss, or to stay awake Tamoxifen Theophylline Thiotepa Ticlopidine Tramadol Warfarin This list may not describe all possible interactions. Give your health care provider a list of all the medicines, herbs, non-prescription drugs, or dietary supplements you use. Also tell them if you smoke, drink alcohol, or use illegal drugs. Some items may interact with your medicine. What should I watch for while using this medication? Tell your care team if your symptoms do not get better or if they get worse. Visit your care team for regular checks on your progress.  Because it may take several weeks  to see the full effects of this medication, it is important to continue your treatment as prescribed. Watch for new or worsening thoughts of suicide or depression. This includes sudden changes in mood, behavior, or thoughts. These changes can happen at any time but are more common in the beginning of treatment or after a change in dose. Call your care team right away if you experience these thoughts or worsening depression. Manic episodes may happen in patients with bipolar disorder who take this medication. Watch for changes in feelings or behaviors such as feeling anxious, nervous, agitated, panicky, irritable, hostile, aggressive, impulsive, severely restless, overly excited and hyperactive, or trouble sleeping. These symptoms can happen at anytime but are more common in the beginning of treatment or after a change in dose. Call your care team right away if you notice any of these symptoms. This medication may cause serious skin reactions. They can happen weeks to months after starting the medication. Contact your care team right away if you notice fevers or flu-like symptoms with a rash. The rash may be red or purple and then turn into blisters or peeling of the skin. Or, you might notice a red rash with swelling of the face, lips or lymph nodes in your neck or under your arms. Avoid drinks that contain alcohol while taking this medication. Drinking large amounts of alcohol, using sleeping or anxiety medications, or quickly stopping the use of these agents while taking this medication may increase your risk for a seizure. Do not drive or use heavy machinery until you know how this medication affects you. This medication can impair your ability to perform these tasks. Do not take this medication close to bedtime. It may prevent you from sleeping. Your mouth may get dry. Chewing sugarless gum or sucking hard candy, and drinking plenty of water may help. Contact your care  team if the problem does not go away or is severe. The tablet shell for some brands of this medication does not dissolve. This is normal. The tablet shell may appear whole in the stool. This is not a cause for concern. What side effects may I notice from receiving this medication? Side effects that you should report to your care team as soon as possible: Allergic reactions--skin rash, itching, hives, swelling of the face, lips, tongue, or throat Increase in blood pressure Mood and behavior changes--anxiety, nervousness, confusion, hallucinations, irritability, hostility, thoughts of suicide or self-harm, worsening mood, feelings of depression Redness, blistering, peeling, or loosening of the skin, including inside the mouth Seizures Sudden eye pain or change in vision such as blurry vision, seeing halos around lights, vision loss Side effects that usually do not require medical attention (report to your care team if they continue or are bothersome): Constipation Dizziness Dry mouth Loss of appetite Nausea Tremors or shaking Trouble sleeping This list may not describe all possible side effects. Call your doctor for medical advice about side effects. You may report side effects to FDA at 1-800-FDA-1088. Where should I keep my medication? Keep out of the reach of children and pets. Store at room temperature between 15 and 30 degrees C (59 and 86 degrees F). Throw away any unused medication after the expiration date. NOTE: This sheet is a summary. It may not cover all possible information. If you have questions about this medicine, talk to your doctor, pharmacist, or health care provider.  2022 Elsevier/Gold Standard (2020-12-25 00:00:00)

## 2021-10-08 NOTE — Progress Notes (Signed)
[facility] MD OP Progress Note  10/08/2021 10:30 AM Stacy Chambers  MRN:  [id number]  Chief Complaint:  Chief Complaint   Follow-up; Depression; Anxiety    HPI: Stacy Chambers is a 55 year old Caucasian female, lives in [place], has a history of bipolar disorder, GAD, insomnia, bereavement, MCI, weight gain, memory loss, vitamin B12 deficiency was evaluated in office today.  Patient today reports she is currently struggling with lack of motivation, anhedonia, fatigue, tiredness, concentration problems during the day.  She reports she also feels she is socially withdrawing from her friends.  2 days ago her brother moved in with her.  She reports she wanted him to move in with her to give her company.  Patient continues to grieve the loss of her husband.  She reports she is taking it 1 day at a time.  Patient reports she is compliant on her medications.  Does report dry mouth from her medication however it is manageable.  Patient also with weight gain, likely multifactorial including medication induced.  Patient denies any suicidality, homicidality or perceptual disturbances.  She is motivated to stay in therapy and reports she has upcoming appointment with her therapist, Mr. Maye Hides   Patient denies any other concerns today.    Visit Diagnosis:    ICD-10-CM   1. Bipolar 1 disorder, mixed, moderate (HCC)  F31.62 OLANZapine (ZYPREXA) 2.5 MG tablet    buPROPion (WELLBUTRIN XL) 150 MG 24 hr tablet    Valproic acid level    2. Generalized anxiety disorder  F41.1 escitalopram (LEXAPRO) 20 MG tablet    Valproic acid level    3. Insomnia due to mental disorder  F51.05    Grief, mood    4. Bereavement  Z63.4     5. Restless legs syndrome (RLS)  G25.81     6. MCI (mild cognitive impairment)  G31.84     7. Weight gain  R63.5    multifactorial    8. High risk medication use  Z79.899 Valproic acid level      Past Psychiatric History: Reviewed past psychiatric history from  progress note on 10/12/2018.  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   Chronic obstructive pulmonary disease ([place]) 10/03/2015   COPD (chronic obstructive pulmonary disease) (HCC)    no inhalers   Elevated liver function tests 11/26/2004   H/O NEGATIVE LIVER BIOPSY, workup negative   Gallstones 02/21/2015   GERD (gastroesophageal reflux disease)    Hyperlipidemia    Insomnia    Lower back pain    Osteoarthritis    Ovarian cancer ([place]) 1995   RLS (restless legs syndrome)    Sedative, hypnotic or anxiolytic dependence with withdrawal, 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[place])    Wears dentures    full upper and lower    Past Surgical History:  Procedure Laterality Date   ABDOMINAL HYSTERECTOMY     APPENDECTOMY     BREAST BIOPSY Left 03/12/2015   fat necrosis   CESAREAN SECTION     COLONOSCOPY WITH PROPOFOL N/A 05/30/2018   Procedure: COLONOSCOPY WITH PROPOFOL;  Surgeon:  Manya Silvas, MD;  Location: [facility];  Service: Endoscopy;  Laterality: N/A;   EXCISION MORTON'S NEUROMA Left 10/07/2016   Procedure: EXCISION MORTON'S NEUROMA;  Surgeon: [name], DPM;  Location: [facility];  Service: Podiatry;  Laterality: Left;  IV WITH LOCAL   LIVER BIOPSY  2005   PerPt: Done to eval elevated LFTs and biopsy provided no definite dx/cause of elevated LFTs   MULTIPLE TOOTH EXTRACTIONS     OOPHORECTOMY     OSTECTOMY Right 04/10/2020   Procedure: DOUBLE OSTEOTOMY GENERAL W/ LOCAL;  Surgeon: [name], DPM;  Location: [facility];  Service: Podiatry;  Laterality: Right;    Family Psychiatric History: Reviewed  family psychiatric history from progress note on 10/12/2018  Family History:  Family History  Adopted: Yes  Problem Relation Age of Onset   COPD Mother    Depression Mother    Colon cancer Mother        unsure of age   73 Father        Lung CA, Skin Cancer--?type   COPD Father    Stroke Father    Cancer Sister 64       cervical cancer   COPD Brother    Alcohol abuse Brother    Stroke Brother    COPD Maternal Aunt    Depression Daughter    Schizophrenia Son    Breast cancer Neg Hx     Social History: Reviewed social history from progress note on 10/12/2018 Social History   Socioeconomic History   Marital status: Widowed    Spouse name: Not on file   Number of children: Not on file   Years of education: Not on file   Highest education level: Not on file  Occupational History   Not on file  Tobacco Use   Smoking status: Former    Packs/day: 1.00    Years: 30.00    Pack years: 30.00    Types: Cigarettes    Quit date: 04/08/2009    Years since quitting: 12.5   Smokeless tobacco: Never  Vaping Use   Vaping Use: Never used  Substance and Sexual Activity   Alcohol use: Yes    Alcohol/week: 3.0 - 7.0 standard drinks    Types: 3 - 7 Shots of liquor per week    Comment: occasionally   Drug use: Yes    Types: Marijuana    Comment: told to refrain until after surgery   Sexual activity: Yes    Birth control/protection: Surgical  Other Topics Concern   Not on file  Social History Narrative   Not on file   Social Determinants of Health   Financial Resource Strain: Low Risk    Difficulty of Paying Living Expenses: Not very hard  Food Insecurity: Not on file  Transportation Needs: Not on file  Physical Activity: Not on file  Stress: Not on file  Social Connections: Not on file    Allergies:  Allergies  Allergen Reactions   Benadryl [Diphenhydramine Hcl]     Hives/ rapid heartrate   Codeine     dizziness   Vicodin [Hydrocodone-Acetaminophen] Other (See  Comments)    dizziness    Metabolic Disorder Labs: Lab Results  Component Value Date   HGBA1C 5.6 09/14/2018   MPG 114 09/14/2018   No results found for: PROLACTIN Lab Results  Component Value Date   CHOL 167 06/18/2020   TRIG 165 (H) 06/18/2020   HDL 54 06/18/2020   CHOLHDL 3.1 06/18/2020  VLDL 18 03/04/2015   LDLCALC 87 06/18/2020   LDLCALC 73 12/19/2019   Lab Results  Component Value Date   TSH 1.51 12/19/2019   TSH 2.320 10/24/2018    Therapeutic Level Labs: Lab Results  Component Value Date   LITHIUM 0.3 (L) 10/24/2018   LITHIUM 0.49 (L) 01/13/2009   Lab Results  Component Value Date   VALPROATE 27 (L) 09/22/2021   No components found for:  CBMZ  Current Medications: Current Outpatient Medications  Medication Sig Dispense Refill   alendronate (FOSAMAX) 70 MG tablet TAKE 1 TABLET (70 MG TOTAL) BY MOUTH ONCE A WEEK. TAKE WITH A FULL GLASS OF WATER ON AN EMPTY STOMACH. 12 tablet 3   ammonium lactate (AMLACTIN) 12 % cream APPLY TOPICALLY AS NEEDED FOR DRY SKIN 385 g 0   benztropine (COGENTIN) 1 MG tablet Take 1 tablet (1 mg total) by mouth 3 (three) times daily as needed for tremors. 270 tablet 0   budesonide-formoterol (SYMBICORT) 160-4.5 MCG/ACT inhaler Inhale 2 puffs into the lungs 2 (two) times daily. 3 each 3   buPROPion (WELLBUTRIN XL) 150 MG 24 hr tablet Take 1 tablet (150 mg total) by mouth daily. 30 tablet 0   clonazePAM (KLONOPIN) 0.5 MG tablet TAKE 1 TABLET 2-3 TIMES A WEEK AS DIRECTED FOR SEVERE PANIC ATTACKS ONLY 13 tablet 0   divalproex (DEPAKOTE ER) 500 MG 24 hr tablet Take 2 tablets (1,000 mg total) by mouth daily. 60 tablet 1   esomeprazole (NEXIUM) 20 MG capsule Take 1 capsule (20 mg total) by mouth daily as needed (take on empty stomach). 90 capsule 1   FLULAVAL QUADRIVALENT 0.5 ML injection      fluticasone (FLONASE) 50 MCG/ACT nasal spray Place 2 sprays into both nostrils daily. 16 mL 2   hydrochlorothiazide (HYDRODIURIL) 12.5 MG tablet       hydrOXYzine (ATARAX/VISTARIL) 50 MG tablet TAKE 1 TABLET TWICE DAILY AS NEEDED FOR SEVERE ANXIETY ATTACKS 180 tablet 0   meloxicam (MOBIC) 7.5 MG tablet TAKE 1 TABLET TWICE DAILY AS NEEDED FOR PAIN 180 tablet 0   methocarbamol (ROBAXIN) 500 MG tablet      rosuvastatin (CRESTOR) 10 MG tablet TAKE 1 TABLET EVERY DAY 90 tablet 3   traZODone (DESYREL) 100 MG tablet TAKE 2 TABLETS AT BEDTIME AS NEEDED FOR SLEEP 180 tablet 0   escitalopram (LEXAPRO) 20 MG tablet Take 0.5 tablets (10 mg total) by mouth daily. 90 tablet 0   OLANZapine (ZYPREXA) 2.5 MG tablet Take 1 tablet (2.5 mg total) by mouth at bedtime as needed. Stop geodon 90 tablet 0   No current facility-administered medications for this visit.     Musculoskeletal: Strength & Muscle Tone: within normal limits Gait & Station: normal Patient leans: N/A  Psychiatric Specialty Exam: Review of Systems  Constitutional:  Positive for fatigue.  Psychiatric/Behavioral:  Positive for decreased concentration and dysphoric mood.   All other systems reviewed and are negative.  Blood pressure 113/80, pulse 68, temperature 98.5 F (36.9 C), temperature source Temporal, weight 183 lb 9.6 oz (83.3 kg).Body mass index is 31.51 kg/m.  General Appearance: Casual  Eye Contact:  Fair  Speech:  Clear and Coherent  Volume:  Normal  Mood:  Depressed, grieving  Affect:  Congruent  Thought Process:  Goal Directed and Descriptions of Associations: Intact  Orientation:  Full (Time, Place, and Person)  Thought Content: Logical   Suicidal Thoughts:  No  Homicidal Thoughts:  No  Memory:  Immediate;   Fair Recent;  Fair Remote;   Limited  Judgement:  Fair  Insight:  Fair  Psychomotor Activity:  Normal  Concentration:  Concentration: Fair and Attention Span: Fair  Recall:  [employer] of Knowledge: Fair  Language: Fair  Akathisia:  No  Handed:  Left  AIMS (if indicated): done, 0  Assets:  Communication Skills Desire for  Improvement Housing Transportation  ADL's:  Intact  Cognition: WNL  Sleep:  Fair   Screenings: AIMS    Flowsheet Row Video Visit from 06/04/2021 in [facility] Visit from 01/28/2021 in [facility] Visit from 10/24/2018 in [facility] Visit from 04/04/2018 in [facility] Visit from 01/03/2018 in [facility] Total Score 0 0 5 0 0      GAD-7    Flowsheet Row Video Visit from 06/25/2021 in [facility] from 03/10/2021 in [facility]  Total GAD-7 Score 17 12      PHQ2-9    [facility] Visit from 10/08/2021 in [facility] from 08/18/2021 in [facility] Visit from 01/28/2021 in [facility] Video Visit from 12/16/2020 in [facility] Visit from 02/07/2020 in [facility]  PHQ-2 Total Score 5 3 6 5  0  PHQ-9 Total Score 9 10 15 20  --      [facility] Office Visit from 10/08/2021 in [facility] Office Visit from 08/18/2021 in [facility] Video Visit from 06/04/2021 in [facility] No Risk No Risk Low Risk        Assessment and Plan: LASHARA UREY is a 55 year old Caucasian female, widowed, lives in [place], has a history of bipolar disorder, anxiety disorder, COPD, RLS, vitamin B12 deficiency was evaluated in office today.  Patient continues to struggle with depression, will benefit from the following plan.  Plan Bipolar disorder mixed, moderate-unstable Change Zyprexa 2.5 mg p.o. nightly as needed.  Patient advised to stop taking it after a few days. Advised to  start trazodone 100 - 200 mg p.o. nightly for sleep. Reduce Lexapro to 10 mg p.o. daily. Start Wellbutrin XL 150 mg p.o. daily in the morning. Depakote ER 1000 mg p.o. daily.  GAD-improving Depakote ER 1000 mg p.o. daily Klonopin 0.5 mg up to 2-3 times a week only for severe panic attacks Reduce Lexapro to 10 mg p.o. daily Hydroxyzine 50 mg p.o. twice daily as needed Continue CBT with Mr. Maye Hides  [facility] Continue CBT with therapist  Restless leg syndrome-improving Benztropine 1 mg p.o. 3 times daily.  We will consider tapering it off since olanzapine is being tapered off.   Weight gain likely multifactorial-unstable Provided education about weight management, diet management, staying active Will discontinue Zyprexa as noted above.  High risk medication use-valproic acid level pending-will order valproic acid level again since her dosage was readjusted.  Patient provided lab slip. Reviewed and discussed platelet count-within normal limits dated 09/22/2021, sodium-138-within normal limits, hepatic function-AST-elevated at 46-chronically high.  Will monitor closely.  Follow-up in clinic in 3 weeks or sooner in person.  We will coordinate care with her therapist.  Patient may benefit from IOP/PHP.  This note was generated in part or whole with voice recognition software. Voice recognition is usually quite accurate but there are transcription errors that can and very often do occur. I  apologize for any typographical errors that were not detected and corrected.     Ursula Alert, MD 10/08/2021, 10:30 AM

## 2021-10-09 ENCOUNTER — Other Ambulatory Visit: Payer: Self-pay | Admitting: Psychiatry

## 2021-10-09 DIAGNOSIS — F3162 Bipolar disorder, current episode mixed, moderate: Secondary | ICD-10-CM

## 2021-10-14 ENCOUNTER — Ambulatory Visit (INDEPENDENT_AMBULATORY_CARE_PROVIDER_SITE_OTHER): Payer: Medicare HMO | Admitting: Clinical

## 2021-10-14 ENCOUNTER — Other Ambulatory Visit: Payer: Self-pay

## 2021-10-14 DIAGNOSIS — F5105 Insomnia due to other mental disorder: Secondary | ICD-10-CM

## 2021-10-14 DIAGNOSIS — F411 Generalized anxiety disorder: Secondary | ICD-10-CM | POA: Diagnosis not present

## 2021-10-14 DIAGNOSIS — F3131 Bipolar disorder, current episode depressed, mild: Secondary | ICD-10-CM

## 2021-10-14 DIAGNOSIS — R41840 Attention and concentration deficit: Secondary | ICD-10-CM | POA: Diagnosis not present

## 2021-10-14 NOTE — Progress Notes (Signed)
Virtual Visit via Video Note   I connected with Stacy Chambers on 10/14/21 at  9:00 AM EST by a video enabled telemedicine application and verified that I am speaking with the correct person using two identifiers.   Location: Patient: Home Provider: Office   I discussed the limitations of evaluation and management by telemedicine and the availability of in person appointments. The patient expressed understanding and agreed to proceed.   THERAPIST PROGRESS NOTE   Session Time: 9:00 AM-9:45 AM   Participation Level: Active   Behavioral Response: CasualAlertDepressed   Type of Therapy: Individual Therapy   Treatment Goals addressed: Coping   Interventions: CBT   Summary: Stacy Chambers is a 55 y.o. female who presents with Bipolar Disorder./ GAD/Insomnia. The OPT therapist worked with the patient for her ongoing OPT treatment session. The OPT therapist utilized Motivational Interviewing to assist in creating therapeutic repore. The patient in the session was engaged and work in collaboration giving feedback about her triggers and symptoms over the past few weeks. The patient spoke about adjusting to her brother coming to live with her.The patient spoke about her recent medication management appointment and is awaiting a new medication to arrive via mail to start and to assist with the intensity of her current mental health symptoms. The OPT therapist utilized Cognitive Behavioral Therapy through cognitive restructuring as well as worked with the patient on coping strategies to assist in management of mood. The OPT therapist worked with the patient on continuing to maintain her basic needs of self care with eating, hygiene, and sleep. The patient spoke about her plans as she is hosting a Christmas family gathering she is looking forward to this coming weekend on Christmas Day.   Suicidal/Homicidal: Nowithout intent/plan   Therapist Response: The OPT therapist worked with the patient for  the patients scheduled session. The patient was engaged in her session and gave feedback in relation to triggers, symptoms, and behavior responses over the past few weeks. The patient identified difficulty with increased intensity of mental health symptoms. The patient spoke about her brother coming to live with her as a positive/ protective factor The OPT therapist worked with the patient utilizing an in session Cognitive Behavioral Therapy exercise. The patient was responsive in the session and verbalized," I am looking forward to [employer] and cooking... I am awaiting and hoping the medication gets delivered this week to start my new medication". The OPT therapist encouraged the patient to continue to use her existing support systems (brother, daughter).The OPT therapist will continue treatment work with the patient in her next scheduled session and will be looking to get feedback from the patient about her adjustment to her new medication.   Plan: Return again in 3 weeks.   Diagnosis:      Axis I: Bipolar 1 disorder,depressed mild/GAD/Insomnia/ Attention deficit.                             Axis II: No diagnosis   I discussed the assessment and treatment plan with the patient. The patient was provided an opportunity to ask questions and all were answered. The patient agreed with the plan and demonstrated an understanding of the instructions.   The patient was advised to call back or seek an in-person evaluation if the symptoms worsen or if the condition fails to improve as anticipated.   I provided 45 minutes of non-face-to-face time during this encounter.   Stacy Chambers, Stacy Chambers    10/14/2021

## 2021-10-29 ENCOUNTER — Other Ambulatory Visit: Payer: Self-pay | Admitting: Psychiatry

## 2021-10-29 DIAGNOSIS — F3162 Bipolar disorder, current episode mixed, moderate: Secondary | ICD-10-CM

## 2021-11-04 DIAGNOSIS — E559 Vitamin D deficiency, unspecified: Secondary | ICD-10-CM | POA: Diagnosis not present

## 2021-11-04 DIAGNOSIS — Z1211 Encounter for screening for malignant neoplasm of colon: Secondary | ICD-10-CM | POA: Diagnosis not present

## 2021-11-04 DIAGNOSIS — J449 Chronic obstructive pulmonary disease, unspecified: Secondary | ICD-10-CM | POA: Diagnosis not present

## 2021-11-04 DIAGNOSIS — R748 Abnormal levels of other serum enzymes: Secondary | ICD-10-CM | POA: Diagnosis not present

## 2021-11-04 DIAGNOSIS — F319 Bipolar disorder, unspecified: Secondary | ICD-10-CM | POA: Diagnosis not present

## 2021-11-04 DIAGNOSIS — Z6829 Body mass index (BMI) 29.0-29.9, adult: Secondary | ICD-10-CM | POA: Diagnosis not present

## 2021-11-04 DIAGNOSIS — Z833 Family history of diabetes mellitus: Secondary | ICD-10-CM | POA: Diagnosis not present

## 2021-11-04 DIAGNOSIS — E538 Deficiency of other specified B group vitamins: Secondary | ICD-10-CM | POA: Diagnosis not present

## 2021-11-04 DIAGNOSIS — E785 Hyperlipidemia, unspecified: Secondary | ICD-10-CM | POA: Diagnosis not present

## 2021-11-05 ENCOUNTER — Ambulatory Visit (INDEPENDENT_AMBULATORY_CARE_PROVIDER_SITE_OTHER): Payer: Medicare HMO | Admitting: Clinical

## 2021-11-05 ENCOUNTER — Other Ambulatory Visit: Payer: Self-pay

## 2021-11-05 DIAGNOSIS — F5105 Insomnia due to other mental disorder: Secondary | ICD-10-CM | POA: Diagnosis not present

## 2021-11-05 DIAGNOSIS — F411 Generalized anxiety disorder: Secondary | ICD-10-CM | POA: Diagnosis not present

## 2021-11-05 DIAGNOSIS — F3131 Bipolar disorder, current episode depressed, mild: Secondary | ICD-10-CM | POA: Diagnosis not present

## 2021-11-05 DIAGNOSIS — R41840 Attention and concentration deficit: Secondary | ICD-10-CM | POA: Diagnosis not present

## 2021-11-05 NOTE — Progress Notes (Signed)
Virtual Visit via Video Note   I connected with Stacy Chambers on 11/05/21 at  10:00 AM EST by a video enabled telemedicine application and verified that I am speaking with the correct person using two identifiers.   Location: Patient: Home Provider: Office   I discussed the limitations of evaluation and management by telemedicine and the availability of in person appointments. The patient expressed understanding and agreed to proceed.   THERAPIST PROGRESS NOTE   Session Time: 10:00 AM-10:30 AM   Participation Level: Active   Behavioral Response: CasualAlertDepressed   Type of Therapy: Individual Therapy   Treatment Goals addressed: Coping   Interventions: CBT   Summary: Stacy Chambers is a 56 y.o. female who presents with Bipolar Disorder./ GAD/Insomnia. The OPT therapist worked with the patient for her ongoing OPT treatment session. The OPT therapist utilized Motivational Interviewing to assist in creating therapeutic repore. The patient in the session was engaged and work in collaboration giving feedback about her triggers and symptoms over the past few weeks. The patient spoke about adjusting to her brother living in the home and this has been a positive.The patient spoke about her upcoming medication management appointment. The OPT therapist utilized Cognitive Behavioral Therapy through cognitive restructuring as well as worked with the patient on coping strategies to assist in management of mood. The OPT therapist worked with the patient on continuing to maintain her basic needs of self care with eating, hygiene, and sleep.   Suicidal/Homicidal: Nowithout intent/plan   Therapist Response: The OPT therapist worked with the patient for the patients scheduled session. The patient was engaged in her session and gave feedback in relation to triggers, symptoms, and behavior responses over the past few weeks. 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  The OPT therapist worked with the patient utilizing an in session Cognitive Behavioral Therapy exercise. The patient was responsive in the session and verbalized," I am still struggling with motivation to get out of bed and feel like I just do not have a lot of energy ". The OPT therapist encouraged the patient to continue to use her existing support systems (brother, daughter).The OPT therapist will continue treatment work with the patient in her next scheduled session and will be looking to get feedback from the psychiatrist in her next scheduled appointment.   Plan: Return again in 3 weeks.   Diagnosis:      Axis I: Bipolar 1 disorder,depressed mild/GAD/Insomnia/ Attention deficit.                             Axis II: No diagnosis   I discussed the assessment and treatment plan with the patient. The patient was provided an opportunity to ask questions and all were answered. The patient agreed with the plan and demonstrated an understanding of the instructions.   The patient was advised to call back or seek an in-person evaluation if the symptoms worsen or if the condition fails to improve as anticipated.   I provided 30 minutes of non-face-to-face time during this encounter.   [name], LCSW    11/05/2021

## 2021-11-12 ENCOUNTER — Ambulatory Visit (INDEPENDENT_AMBULATORY_CARE_PROVIDER_SITE_OTHER): Payer: Medicare HMO | Admitting: Psychiatry

## 2021-11-12 ENCOUNTER — Encounter: Payer: Self-pay | Admitting: Psychiatry

## 2021-11-12 ENCOUNTER — Other Ambulatory Visit: Payer: Self-pay

## 2021-11-12 VITALS — BP 112/77 | HR 86 | Temp 98.3°F | Ht 65.0 in | Wt 177.8 lb

## 2021-11-12 DIAGNOSIS — R635 Abnormal weight gain: Secondary | ICD-10-CM

## 2021-11-12 DIAGNOSIS — Z634 Disappearance and death of family member: Secondary | ICD-10-CM | POA: Diagnosis not present

## 2021-11-12 DIAGNOSIS — G2581 Restless legs syndrome: Secondary | ICD-10-CM

## 2021-11-12 DIAGNOSIS — F411 Generalized anxiety disorder: Secondary | ICD-10-CM

## 2021-11-12 DIAGNOSIS — F3131 Bipolar disorder, current episode depressed, mild: Secondary | ICD-10-CM

## 2021-11-12 DIAGNOSIS — G3184 Mild cognitive impairment, so stated: Secondary | ICD-10-CM | POA: Diagnosis not present

## 2021-11-12 DIAGNOSIS — F5105 Insomnia due to other mental disorder: Secondary | ICD-10-CM | POA: Diagnosis not present

## 2021-11-12 DIAGNOSIS — Z79899 Other long term (current) drug therapy: Secondary | ICD-10-CM | POA: Diagnosis not present

## 2021-11-12 MED ORDER — BUPROPION HCL ER (XL) 150 MG PO TB24: 150.0000 mg | ORAL_TABLET | Freq: Every day | ORAL | 1 refills | Status: DC

## 2021-11-12 MED ORDER — DIVALPROEX SODIUM ER 500 MG PO TB24: 1000.0000 mg | ORAL_TABLET | Freq: Every day | ORAL | 1 refills | Status: DC

## 2021-11-12 NOTE — Patient Instructions (Addendum)
[facility] [place],  Call us: [phone] Visit [name]: [address], [place], [place] [address]  [facility] - Call [phone] .

## 2021-11-12 NOTE — Progress Notes (Signed)
[place] MD OP Progress Note  11/12/2021 6:07 PM Stacy Chambers  MRN:  [id number]  Chief Complaint:  Chief Complaint   Follow-up; Anxiety; Depression, 56 year old Caucasian female, with history of depression, anxiety, complicated by recent loss of her husband, currently also struggling with grief presents for medication management.    HPI: Stacy Chambers is a 56 year old Caucasian female, lives in [place], has a history of bipolar disorder, GAD, insomnia, bereavement, MCI, weight gain, memory loss, vitamin B12 deficiency was evaluated in office today.  Patient continues to struggle with lack of motivation, anhedonia, fatigue, tiredness and concentration problems during the day although reports some improvement with the Wellbutrin.  The Wellbutrin was started 3 to 4 weeks ago.  Patient reports compliance.  Denies side effects.  Patient however continues to grieve the loss of her husband as well as her son who passed away.  She reports there has been several other deaths in the family over the past few years.  She reports it is her son's birthday coming up and she has not had the courage to talk about her grief of losing her son up until now although it happened several years ago.  Patient became tearful when she discussed her grief with Probation officer.  Patient does have psychotherapy sessions and is agreeable to discuss this in session.  Some time was spent discussing grief and coping with it.  Patient reports sleep is overall okay at night.  She however reports she does have excessive sleepiness during the day although it is improving on the Wellbutrin.  She has to find a purpose and something to keep her occupied during the day and that seems to help.  Her brother lives with her however she reports he is with his girlfriend all the time and she feels isolated most of the time.  Patient denies any suicidality, homicidality or perceptual disturbances.  She continues to be interested in weight loss.  She  was able to taper off the olanzapine .  She may have lost a few pounds since then.  Patient was advised to reduce the dosage of Lexapro to 10 mg last visit.  Patient reports she however stopped taking the Lexapro.  She would like to stay off of it for now.  Reviewed and discussed Depakote labs-dated 10/08/2021-with patient-within normal limits-therapeutic.  Patient denies any other concerns today.  Visit Diagnosis:    ICD-10-CM   1. Bipolar 1 disorder, depressed, mild (HCC)  F31.31 buPROPion (WELLBUTRIN XL) 150 MG 24 hr tablet    divalproex (DEPAKOTE ER) 500 MG 24 hr tablet    2. Generalized anxiety disorder  F41.1     3. Insomnia due to mental disorder  F51.05     4. Bereavement  Z63.4     5. Restless legs syndrome (RLS)  G25.81     6. MCI (mild cognitive impairment)  G31.84     7. Weight gain  R63.5    likely secondary to medications like zyprexa    8. High risk medication use  Z79.899       Past Psychiatric History: Reviewed past psychiatric history from progress note on 10/12/2018.  Past trials of BuSpar, Zoloft, carbamazepine.  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   Chronic obstructive pulmonary disease ([place]) 10/03/2015   COPD (chronic obstructive pulmonary disease) (HCC)    no inhalers   Elevated liver function tests 11/26/2004   H/O NEGATIVE LIVER BIOPSY, workup negative   Gallstones 02/21/2015   GERD (gastroesophageal reflux disease)    Hyperlipidemia    Insomnia    Lower back pain    Osteoarthritis    Ovarian cancer ([place]) 1995   RLS (restless legs syndrome)    Sedative, hypnotic or anxiolytic dependence with withdrawal, unspecified  ([place])    Wears dentures    full upper and lower    Past Surgical History:  Procedure Laterality Date   ABDOMINAL HYSTERECTOMY     APPENDECTOMY     BREAST BIOPSY Left 03/12/2015   fat necrosis   CESAREAN SECTION     COLONOSCOPY WITH PROPOFOL N/A 05/30/2018   Procedure: COLONOSCOPY WITH PROPOFOL;  Surgeon: Manya Silvas, MD;  Location: [facility] ENDOSCOPY;  Service: Endoscopy;  Laterality: N/A;   EXCISION MORTON'S NEUROMA Left 10/07/2016   Procedure: EXCISION MORTON'S NEUROMA;  Surgeon: [name], DPM;  Location: [facility];  Service: Podiatry;  Laterality: Left;  IV WITH LOCAL   LIVER BIOPSY  2005   PerPt: Done to eval elevated LFTs and biopsy provided no definite dx/cause of elevated LFTs   MULTIPLE TOOTH EXTRACTIONS     OOPHORECTOMY     OSTECTOMY Right 04/10/2020   Procedure: DOUBLE OSTEOTOMY GENERAL W/ LOCAL;  Surgeon: [name], DPM;  Location: [facility];  Service: Podiatry;  Laterality: Right;    Family Psychiatric History: I have reviewed family psychiatric history from progress note on 10/12/2018.  Family History:  Family History  Adopted: Yes  Problem Relation Age of Onset   COPD Mother    Depression Mother    Colon cancer Mother        unsure of age   30 Father        Lung CA, Skin Cancer--?type   COPD Father    Stroke Father    Cancer Sister 79       cervical cancer   COPD Brother    Alcohol abuse Brother    Stroke Brother    COPD Maternal Aunt    Depression Daughter    Schizophrenia Son    Breast cancer Neg Hx     Social History: Reviewed social history from progress note on 10/12/2018. Social History   Socioeconomic History   Marital status: Widowed    Spouse name: Not on file   Number of children: Not on file   Years of education: Not on file   Highest education level: Not on file  Occupational History   Not on file  Tobacco Use   Smoking status: Former    Packs/day: 1.00    Years: 30.00    Pack years: 30.00     Types: Cigarettes    Quit date: 04/08/2009    Years since quitting: 12.6   Smokeless tobacco: Never  Vaping Use   Vaping Use: Never used  Substance and Sexual Activity   Alcohol use: Not Currently    Alcohol/week: 3.0 - 7.0 standard drinks    Types: 3 - 7 Shots of liquor per week    Comment: occasionally   Drug use: Not Currently    Types: Marijuana    Comment: told to refrain until after surgery  Sexual activity: Yes    Birth control/protection: Surgical  Other Topics Concern   Not on file  Social History Narrative   Not on file   Social Determinants of Health   Financial Resource Strain: Low Risk    Difficulty of Paying Living Expenses: Not very hard  Food Insecurity: Not on file  Transportation Needs: Not on file  Physical Activity: Not on file  Stress: Not on file  Social Connections: Not on file    Allergies:  Allergies  Allergen Reactions   Benadryl [Diphenhydramine Hcl]     Hives/ rapid heartrate   Codeine     dizziness   Vicodin [Hydrocodone-Acetaminophen] Other (See Comments)    dizziness    Metabolic Disorder Labs: Lab Results  Component Value Date   HGBA1C 5.6 09/14/2018   MPG 114 09/14/2018   No results found for: PROLACTIN Lab Results  Component Value Date   CHOL 167 06/18/2020   TRIG 165 (H) 06/18/2020   HDL 54 06/18/2020   CHOLHDL 3.1 06/18/2020   VLDL 18 03/04/2015   LDLCALC 87 06/18/2020   LDLCALC 73 12/19/2019   Lab Results  Component Value Date   TSH 1.51 12/19/2019   TSH 2.320 10/24/2018    Therapeutic Level Labs: Lab Results  Component Value Date   LITHIUM 0.3 (L) 10/24/2018   LITHIUM 0.49 (L) 01/13/2009   Lab Results  Component Value Date   VALPROATE 73 10/08/2021   VALPROATE 27 (L) 09/22/2021   No components found for:  CBMZ  Current Medications: Current Outpatient Medications  Medication Sig Dispense Refill   alendronate (FOSAMAX) 70 MG tablet TAKE 1 TABLET (70 MG TOTAL) BY MOUTH ONCE A WEEK. TAKE WITH A FULL  GLASS OF WATER ON AN EMPTY STOMACH. 12 tablet 3   ammonium lactate (AMLACTIN) 12 % cream APPLY TOPICALLY AS NEEDED FOR DRY SKIN 385 g 0   budesonide-formoterol (SYMBICORT) 160-4.5 MCG/ACT inhaler Inhale 2 puffs into the lungs 2 (two) times daily. 3 each 3   buPROPion (WELLBUTRIN XL) 150 MG 24 hr tablet Take 1 tablet (150 mg total) by mouth daily. 90 tablet 1   clonazePAM (KLONOPIN) 0.5 MG tablet TAKE 1 TABLET 2-3 TIMES A WEEK AS DIRECTED FOR SEVERE PANIC ATTACKS ONLY 13 tablet 0   divalproex (DEPAKOTE ER) 500 MG 24 hr tablet Take 2 tablets (1,000 mg total) by mouth daily. 180 tablet 1   esomeprazole (NEXIUM) 20 MG capsule Take 1 capsule (20 mg total) by mouth daily as needed (take on empty stomach). 90 capsule 1   FLULAVAL QUADRIVALENT 0.5 ML injection      fluticasone (FLONASE) 50 MCG/ACT nasal spray Place 2 sprays into both nostrils daily. 16 mL 2   hydrochlorothiazide (HYDRODIURIL) 12.5 MG tablet      hydrOXYzine (ATARAX/VISTARIL) 50 MG tablet TAKE 1 TABLET TWICE DAILY AS NEEDED FOR SEVERE ANXIETY ATTACKS 180 tablet 0   meloxicam (MOBIC) 7.5 MG tablet TAKE 1 TABLET TWICE DAILY AS NEEDED FOR PAIN 180 tablet 0   methocarbamol (ROBAXIN) 500 MG tablet      rosuvastatin (CRESTOR) 10 MG tablet TAKE 1 TABLET EVERY DAY 90 tablet 3   traZODone (DESYREL) 100 MG tablet TAKE 2 TABLETS AT BEDTIME AS NEEDED FOR SLEEP 180 tablet 0   No current facility-administered medications for this visit.     Musculoskeletal: Strength & Muscle Tone: within normal limits Gait & Station: normal Patient leans: N/A  Psychiatric Specialty Exam: Review of Systems  Psychiatric/Behavioral:  Positive for decreased concentration and  dysphoric mood. The patient is nervous/anxious.   All other systems reviewed and are negative.  Blood pressure 112/77, pulse 86, temperature 98.3 F (36.8 C), height 5\' 5"  (1.651 m), weight 177 lb 12.8 oz (80.6 kg), SpO2 97 %.Body mass index is 29.59 kg/m.  General Appearance: Casual  Eye  Contact:  Good  Speech:  Clear and Coherent  Volume:  Normal  Mood:  Anxious and Depressed, grieving  Affect:  Congruent  Thought Process:  Goal Directed and Descriptions of Associations: Intact  Orientation:  Full (Time, Place, and Person)  Thought Content: Logical   Suicidal Thoughts:  No  Homicidal Thoughts:  No  Memory:  Immediate;   Fair Recent;   Fair Remote;   Limited  Judgement:  Fair  Insight:  Fair  Psychomotor Activity:  Normal  Concentration:  Concentration: Fair and Attention Span: Fair  Recall:  [employer] of Knowledge: Fair  Language: Fair  Akathisia:  No  Handed:  Left  AIMS (if indicated): done, 0  Assets:  Communication Skills Desire for Improvement Housing Social Support  ADL's:  Intact  Cognition: WNL  Sleep:  Fair   Screenings: AIMS    Flowsheet Row Video Visit from 06/04/2021 in [facility] Office Visit from 01/28/2021 in [facility] Visit from 10/24/2018 in [facility] Visit from 04/04/2018 in [facility] Visit from 01/03/2018 in [facility] Total Score 0 0 5 0 0      GAD-7    Flowsheet Row Video Visit from 06/25/2021 in [facility] from 03/10/2021 in [facility]  Total GAD-7 Score 17 12      PHQ2-9    [place] Visit from 10/08/2021 in [facility] from 08/18/2021 in [facility] Visit from 01/28/2021 in [facility] Video Visit from 12/16/2020 in [facility] Visit from 02/07/2020 in [facility]  PHQ-2 Total Score 5 3 6 5  0  PHQ-9 Total Score 9 10 15 20  --      [place] [facility] Office Visit from 10/08/2021 in [facility] Office Visit from 08/18/2021 in [facility] Video Visit from 06/04/2021 in [facility] No Risk No Risk Low Risk        Assessment and Plan: DENITA LUN is a 56 year old Caucasian female, widowed, lives in [place], has a history of bipolar disorder, anxiety disorder, COPD, RLS, vitamin B12 deficiency was evaluated in office today.  Patient continues to struggle with depression, grief although improving on the Wellbutrin.  Plan as noted below.  Plan Bipolar disorder mixed, moderate-improving Continue Wellbutrin XL 150 mg p.o. daily Continue trazodone 100-200 mg p.o. nightly for sleep Depakote ER 1000 mg p.o. daily Discontinue Lexapro-noncompliance.  GAD-improving Depakote ER 1000 mg p.o. daily Klonopin 0.5 mg up to 2-3 times a week only for severe panic attacks Hydroxyzine 50 mg p.o. twice daily as needed Continue CBT with Mr. Maye Hides.   Bereavement-unstable Provided community resources.  Patient may benefit from support groups, intensive outpatient program. Patient advised to [facility] out to [facility]-provided contact information. She could continue to work with her therapist also.   Restless leg syndrome-improving Discontinue benztropine since she is no longer on the olanzapine.   Weight gain likely multifactorial-improving Continue diet management,  lifestyle modification, exercise. She is no longer on the Zyprexa at this time.  We will monitor her weight.  High risk medication use-reviewed and discussed Depakote level dated 10/08/2021-therapeutic at 27.  Follow-up in clinic in 2 to 3 weeks or sooner if needed.  This note was generated in part or whole with voice recognition software. Voice recognition is usually quite accurate but there are transcription errors that can and very often do occur. I apologize for any typographical  errors that were not detected and corrected.      Ursula Alert, MD 11/12/2021, 6:07 PM

## 2021-11-21 ENCOUNTER — Other Ambulatory Visit: Payer: Self-pay | Admitting: Psychiatry

## 2021-11-21 DIAGNOSIS — F3131 Bipolar disorder, current episode depressed, mild: Secondary | ICD-10-CM

## 2021-11-27 ENCOUNTER — Ambulatory Visit (INDEPENDENT_AMBULATORY_CARE_PROVIDER_SITE_OTHER): Payer: Medicare HMO | Admitting: Clinical

## 2021-11-27 ENCOUNTER — Ambulatory Visit (HOSPITAL_COMMUNITY): Payer: Medicare HMO | Admitting: Clinical

## 2021-11-27 ENCOUNTER — Other Ambulatory Visit: Payer: Self-pay

## 2021-11-27 DIAGNOSIS — F5105 Insomnia due to other mental disorder: Secondary | ICD-10-CM | POA: Diagnosis not present

## 2021-11-27 DIAGNOSIS — F411 Generalized anxiety disorder: Secondary | ICD-10-CM | POA: Diagnosis not present

## 2021-11-27 DIAGNOSIS — R41840 Attention and concentration deficit: Secondary | ICD-10-CM

## 2021-11-27 DIAGNOSIS — F3131 Bipolar disorder, current episode depressed, mild: Secondary | ICD-10-CM

## 2021-11-27 NOTE — Progress Notes (Signed)
Virtual Visit via Video Note   I connected with Stacy Chambers on 11/27/21 at  10:00 AM EST by a video enabled telemedicine application and verified that I am speaking with the correct person using two identifiers.   Location: Patient: Home Provider: Office   I discussed the limitations of evaluation and management by telemedicine and the availability of in person appointments. The patient expressed understanding and agreed to proceed.   THERAPIST PROGRESS NOTE   Session Time: 10:00 AM-10:45 AM   Participation Level: Active   Behavioral Response: CasualAlertDepressed   Type of Therapy: Individual Therapy   Treatment Goals addressed: Coping   Interventions: CBT   Summary: Stacy Chambers is a 56 y.o. female who presents with Bipolar Disorder./ GAD/Insomnia. The OPT therapist worked with the patient for her ongoing OPT treatment session. The OPT therapist utilized Motivational Interviewing to assist in creating therapeutic repore. The patient in the session was engaged and work in collaboration giving feedback about her triggers and symptoms over the past few weeks. The patient spoke about trying to stay busy and redo her in home closet. The OPT therapist utilized Cognitive Behavioral Therapy through cognitive restructuring as well as worked with the patient on coping strategies to assist in management of mood. The OPT therapist over-viewed the patients recent Medication Management appointment. The OPT therapist worked with the patient on continuing to maintain her basic needs of self care with eating, hygiene, and sleep.   Suicidal/Homicidal: Nowithout intent/plan   Therapist Response: The OPT therapist worked with the patient for the patients scheduled session. The patient was engaged in her session and gave feedback in relation to triggers, symptoms, and behavior responses over the past few weeks. The patient spoke about difficulty managing mood especially during times of being alone.  The patient spoke about the dog at her home her brothers dog having 11 puppies. The OPT therapist worked with the patient utilizing an in session Cognitive Behavioral Therapy exercise. The patient was responsive in the session and verbalized," I just want to know when things will start to get better with this grief process ".  The OPT therapist worked with the patient validating her feelings and providing on going grief counseling.The OPT therapist will continue treatment work with the patient in her next scheduled session    Plan: Return again in 3 weeks.   Diagnosis:      Axis I: Bipolar 1 disorder,depressed mild/GAD/Insomnia/ Attention deficit.                             Axis II: No diagnosis   I discussed the assessment and treatment plan with the patient. The patient was provided an opportunity to ask questions and all were answered. The patient agreed with the plan and demonstrated an understanding of the instructions.   The patient was advised to call back or seek an in-person evaluation if the symptoms worsen or if the condition fails to improve as anticipated.   I provided 45 minutes of non-face-to-face time during this encounter.   [name], LCSW    11/27/2021

## 2021-11-28 ENCOUNTER — Other Ambulatory Visit: Payer: Self-pay | Admitting: Psychiatry

## 2021-12-22 ENCOUNTER — Other Ambulatory Visit: Payer: Self-pay

## 2021-12-22 ENCOUNTER — Ambulatory Visit (INDEPENDENT_AMBULATORY_CARE_PROVIDER_SITE_OTHER): Payer: Medicare HMO | Admitting: Clinical

## 2021-12-22 DIAGNOSIS — F3131 Bipolar disorder, current episode depressed, mild: Secondary | ICD-10-CM | POA: Diagnosis not present

## 2021-12-22 DIAGNOSIS — R41840 Attention and concentration deficit: Secondary | ICD-10-CM

## 2021-12-22 DIAGNOSIS — F411 Generalized anxiety disorder: Secondary | ICD-10-CM

## 2021-12-22 DIAGNOSIS — F5105 Insomnia due to other mental disorder: Secondary | ICD-10-CM | POA: Diagnosis not present

## 2021-12-22 NOTE — Progress Notes (Signed)
Virtual Visit via Video Note   I connected with Stacy Chambers on 12/22/21 at  10:00 AM EST by a video enabled telemedicine application and verified that I am speaking with the correct person using two identifiers.   Location: Patient: Home Provider: Office   I discussed the limitations of evaluation and management by telemedicine and the availability of in person appointments. The patient expressed understanding and agreed to proceed.   THERAPIST PROGRESS NOTE   Session Time: 10:00 AM-10:45 AM   Participation Level: Active   Behavioral Response: CasualAlertDepressed   Type of Therapy: Individual Therapy   Treatment Goals addressed: Coping   Interventions: CBT   Summary: Stacy Rushing. Chambers is a 56 y.o. female who presents with Bipolar Disorder./ GAD/Insomnia. The OPT therapist worked with the patient for her ongoing OPT treatment session. The OPT therapist utilized Motivational Interviewing to assist in creating therapeutic repore. The patient in the session was engaged and work in collaboration giving feedback about her triggers and symptoms over the past few weeks. 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 The patient spoke about her brothers girlfriend coming to live in the home and noted, " His girlfriend sleeps all day and all night and the only benefit from her being here is food cost". The OPT therapist utilized Cognitive Behavioral Therapy through cognitive restructuring as well as worked with the patient on coping strategies to assist in management of mood. The patient spoke about taking on a tenant to create income and the situation did not work out leaving the patient to have to clean up the items left in the home when the roommate vacated The OPT therapist over-viewed the patients recent Medication Management appointment. The OPT therapist worked with the patient on continuing to maintain her basic needs of self care with  eating, hygiene, and sleep.   Suicidal/Homicidal: Nowithout intent/plan   Therapist Response: The OPT therapist worked with the patient for the patients scheduled session. The patient was engaged in her session and gave feedback in relation to triggers, symptoms, and behavior responses over the past few weeks. The OPT therapist worked with the patient utilizing an in session Cognitive Behavioral Therapy exercise. The patient was responsive in the session and verbalized," I have decided not to take on a roommate again while my brother is already living here its just to much to take on and we have these 11 puppies".  The OPT therapist worked with the patient validating her feelings and providing on going treatment work in [place] of her mood through coping strategies.The OPT therapist encouraged the patient to speak up for her self and to set boundaries in the home.The OPT therapist will continue treatment work with the patient in her next scheduled session    Plan: Return again in 3 weeks.   Diagnosis:      Axis I: Bipolar 1 disorder,depressed mild/GAD/Insomnia/ Attention deficit.                             Axis II: No diagnosis   Collaboration of Care: No additional collaboration for this session.   Patient/Guardian was advised Release of Information must be obtained prior to any record release in order to collaborate their care with an outside provider. Patient/Guardian was advised if they have not already done so to contact the registration department to sign all necessary forms in order for [name] to release  information regarding their care.    Consent: Patient/Guardian gives verbal consent for treatment and assignment of benefits for services provided during this visit. Patient/Guardian expressed understanding and agreed to proceed.      I discussed the assessment and treatment plan with the patient. The patient was provided an opportunity to ask questions and all were answered. The patient  agreed with the plan and demonstrated an understanding of the instructions.   The patient was advised to call back or seek an in-person evaluation if the symptoms worsen or if the condition fails to improve as anticipated.   I provided 45 minutes of non-face-to-face time during this encounter.   [name], LCSW    12/22/2021

## 2021-12-24 ENCOUNTER — Encounter: Payer: Self-pay | Admitting: Psychiatry

## 2021-12-24 ENCOUNTER — Other Ambulatory Visit: Payer: Self-pay

## 2021-12-24 ENCOUNTER — Telehealth (INDEPENDENT_AMBULATORY_CARE_PROVIDER_SITE_OTHER): Payer: Medicare HMO | Admitting: Psychiatry

## 2021-12-24 DIAGNOSIS — Z634 Disappearance and death of family member: Secondary | ICD-10-CM

## 2021-12-24 DIAGNOSIS — F411 Generalized anxiety disorder: Secondary | ICD-10-CM | POA: Diagnosis not present

## 2021-12-24 DIAGNOSIS — G2581 Restless legs syndrome: Secondary | ICD-10-CM | POA: Diagnosis not present

## 2021-12-24 DIAGNOSIS — F5105 Insomnia due to other mental disorder: Secondary | ICD-10-CM | POA: Diagnosis not present

## 2021-12-24 DIAGNOSIS — G3184 Mild cognitive impairment, so stated: Secondary | ICD-10-CM | POA: Diagnosis not present

## 2021-12-24 DIAGNOSIS — R635 Abnormal weight gain: Secondary | ICD-10-CM | POA: Diagnosis not present

## 2021-12-24 DIAGNOSIS — F3132 Bipolar disorder, current episode depressed, moderate: Secondary | ICD-10-CM

## 2021-12-24 MED ORDER — BUPROPION HCL ER (XL) 150 MG PO TB24: 150.0000 mg | ORAL_TABLET | Freq: Every day | ORAL | 2 refills | Status: DC

## 2021-12-24 MED ORDER — CARIPRAZINE HCL 1.5 MG PO CAPS: 1.5000 mg | ORAL_CAPSULE | Freq: Every day | ORAL | 0 refills | Status: DC

## 2021-12-24 NOTE — Progress Notes (Signed)
Virtual Visit via Video Note  I connected with Stacy Chambers on 12/24/21 at  9:20 AM EST by a video enabled telemedicine application and verified that I am speaking with the correct person using two identifiers. Location Provider Location : [facility] Patient Location : Home  Participants: Patient , Provider    I discussed the limitations of evaluation and management by telemedicine and the availability of in person appointments. The patient expressed understanding and agreed to proceed.     I discussed the assessment and treatment plan with the patient. The patient was provided an opportunity to ask questions and all were answered. The patient agreed with the plan and demonstrated an understanding of the instructions.   The patient was advised to call back or seek an in-person evaluation if the symptoms worsen or if the condition fails to improve as anticipated.                                                                       [facility] MD OP Progress Note  12/24/2021 3:00 PM Stacy Chambers  MRN:  [id number]  Chief Complaint:  Chief Complaint  Patient presents with   Follow-up: 56 year old Caucasian female with history of depression, anxiety, complicated by recent loss of her husband, presented for medication management.   HPI: Stacy Chambers is a 57 year old Caucasian female, lives in [place], has a history of bipolar disorder, GAD, insomnia, bereavement, MCI, weight gain, memory loss, vitamin B12 deficiency was evaluated by telemedicine today.  Patient today reports she is currently struggling with depressive symptoms, sadness, low motivation, anhedonia, concentration problems.  She also grieves the loss of her husband.  She reports she is not improving although she continues to be in individual psychotherapy.  Patient is compliant on her medications.  Agreeable to start a mood stabilizer, Vraylar.  Patient reports sleep has improved.  Currently on trazodone which helps.  Patient  reports appetite is fair.  She reports she has joined a widow's group, and that has been going well so far.  They meet every day.  She joined the group 2 weeks ago.  Patient denies any suicidality, homicidality or perceptual disturbances.  Patient denies any other concerns today.  Visit Diagnosis:    ICD-10-CM   1. Bipolar 1 disorder, depressed, moderate (HCC)  F31.32 cariprazine (VRAYLAR) 1.5 MG capsule    2. Generalized anxiety disorder  F41.1     3. Insomnia due to mental disorder  F51.05    mood    4. Bereavement  Z63.4 buPROPion (WELLBUTRIN XL) 150 MG 24 hr tablet    5. Restless legs syndrome (RLS)  G25.81     6. MCI (mild cognitive impairment)  G31.84     7. Weight gain  R63.5    likely secondary to antipsychotics      Past Psychiatric History: Reviewed past psychiatric history from progress note on 10/12/2018.  Past trials of BuSpar, Zoloft, carbamazepine.  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)    Wears dentures    full upper and lower    Past Surgical History:  Procedure Laterality Date   ABDOMINAL HYSTERECTOMY     APPENDECTOMY     BREAST BIOPSY Left  03/12/2015   fat necrosis   CESAREAN SECTION     COLONOSCOPY WITH PROPOFOL N/A 05/30/2018   Procedure: COLONOSCOPY WITH PROPOFOL;  Surgeon: Manya Silvas, MD;  Location[facility] ENDOSCOPY;  Service: Endoscopy;  Laterality: N/A;   EXCISION MORTON'S NEUROMA Left 10/07/2016   Procedure: EXCISION MORTON'S NEUROMA;  Surgeon: [name], DPM;  Location: [facility];  Service: Podiatry;  Laterality: Left;  IV WITH LOCAL   LIVER BIOPSY  2005   PerPt: Done to eval elevated LFTs and biopsy provided no definite dx/cause of elevated LFTs   MULTIPLE TOOTH EXTRACTIONS     OOPHORECTOMY     OSTECTOMY Right 04/10/2020   Procedure: DOUBLE OSTEOTOMY GENERAL W/ LOCAL;  Surgeon: [name], DPM;  Location: [facility];  Service: Podiatry;  Laterality: Right;    Family Psychiatric History: Reviewed family psychiatric history from progress note on 10/12/2018.  Family History:  Family History  Adopted: Yes  Problem Relation Age of Onset   COPD Mother    Depression Mother    Colon cancer Mother        unsure of age   57 Father        Lung CA, Skin Cancer--?type   COPD Father    Stroke Father    Cancer Sister 68       cervical cancer   COPD Brother    Alcohol abuse Brother    Stroke Brother    COPD Maternal Aunt    Depression Daughter    Schizophrenia Son    Breast cancer Neg Hx     Social History: Reviewed social history from progress note on 10/12/2018. Social History   Socioeconomic History   Marital status: Widowed    Spouse name: Not on file   Number of children: Not on file   Years of education: Not on file   Highest education level: Not on file  Occupational History   Not on file  Tobacco Use   Smoking status: Former    Packs/day: 1.00    Years: 30.00    Pack years: 30.00    Types: Cigarettes    Quit date: 04/08/2009    Years since quitting: 12.7   Smokeless tobacco: Never  Vaping Use   Vaping Use: Never used  Substance and Sexual Activity   Alcohol  use: Not Currently    Alcohol/week: 3.0 - 7.0 standard drinks    Types: 3 - 7 Shots of liquor per week    Comment: occasionally   Drug use: Not Currently    Types: Marijuana    Comment: told to refrain until after surgery  Sexual activity: Yes    Birth control/protection: Surgical  Other Topics Concern   Not on file  Social History Narrative   Not on file   Social Determinants of Health   Financial Resource Strain: Low Risk    Difficulty of Paying Living Expenses: Not very hard  Food Insecurity: Not on file  Transportation Needs: Not on file  Physical Activity: Not on file  Stress: Not on file  Social Connections: Not on file    Allergies:  Allergies  Allergen Reactions   Benadryl [Diphenhydramine Hcl]     Hives/ rapid heartrate   Codeine     dizziness   Vicodin [Hydrocodone-Acetaminophen] Other (See Comments)    dizziness    Metabolic Disorder Labs: Lab Results  Component Value Date   HGBA1C 5.6 09/14/2018   MPG 114 09/14/2018   No results found for: PROLACTIN Lab Results  Component Value Date   CHOL 167 06/18/2020   TRIG 165 (H) 06/18/2020   HDL 54 06/18/2020   CHOLHDL 3.1 06/18/2020   VLDL 18 03/04/2015   LDLCALC 87 06/18/2020   LDLCALC 73 12/19/2019   Lab Results  Component Value Date   TSH 1.51 12/19/2019   TSH 2.320 10/24/2018    Therapeutic Level Labs: Lab Results  Component Value Date   LITHIUM 0.3 (L) 10/24/2018   LITHIUM 0.49 (L) 01/13/2009   Lab Results  Component Value Date   VALPROATE 73 10/08/2021   VALPROATE 27 (L) 09/22/2021   No components found for:  CBMZ  Current Medications: Current Outpatient Medications  Medication Sig Dispense Refill   cariprazine (VRAYLAR) 1.5 MG capsule Take 1 capsule (1.5 mg total) by mouth daily. 30 capsule 0   alendronate (FOSAMAX) 70 MG tablet TAKE 1 TABLET (70 MG TOTAL) BY MOUTH ONCE A WEEK. TAKE WITH A FULL GLASS OF WATER ON AN EMPTY STOMACH. 12 tablet 3   ammonium lactate (AMLACTIN) 12 %  cream APPLY TOPICALLY AS NEEDED FOR DRY SKIN 385 g 0   budesonide-formoterol (SYMBICORT) 160-4.5 MCG/ACT inhaler Inhale 2 puffs into the lungs 2 (two) times daily. 3 each 3   buPROPion (WELLBUTRIN XL) 150 MG 24 hr tablet Take 1 tablet (150 mg total) by mouth daily. 90 tablet 2   clonazePAM (KLONOPIN) 0.5 MG tablet TAKE 1 TABLET 2-3 TIMES A WEEK AS DIRECTED FOR SEVERE PANIC ATTACKS ONLY 13 tablet 0   divalproex (DEPAKOTE ER) 500 MG 24 hr tablet TAKE 2 TABLETS BY MOUTH EVERY DAY 60 tablet 1   esomeprazole (NEXIUM) 20 MG capsule Take 1 capsule (20 mg total) by mouth daily as needed (take on empty stomach). 90 capsule 1   FLULAVAL QUADRIVALENT 0.5 ML injection      fluticasone (FLONASE) 50 MCG/ACT nasal spray Place 2 sprays into both nostrils daily. 16 mL 2   hydrochlorothiazide (HYDRODIURIL) 12.5 MG tablet      hydrOXYzine (ATARAX/VISTARIL) 50 MG tablet TAKE 1 TABLET TWICE DAILY AS NEEDED FOR SEVERE ANXIETY ATTACKS 180 tablet 0   meloxicam (MOBIC) 7.5 MG tablet TAKE 1 TABLET TWICE DAILY AS NEEDED FOR PAIN 180 tablet 0   methocarbamol (ROBAXIN) 500 MG tablet      rosuvastatin (CRESTOR) 10 MG tablet TAKE 1 TABLET EVERY DAY 90 tablet 3   traZODone (DESYREL) 100 MG tablet TAKE 2 TABLETS AT BEDTIME AS NEEDED FOR SLEEP 180 tablet 0   No current facility-administered medications for this visit.     Musculoskeletal: Strength & Muscle Tone:  UTA Gait & Station:  WNL Patient  leans: N/A  Psychiatric Specialty Exam: Review of Systems  Psychiatric/Behavioral:  Positive for dysphoric mood. The patient is nervous/anxious.   All other systems reviewed and are negative.  There were no vitals taken for this visit.There is no height or weight on file to calculate BMI.  General Appearance: Casual  Eye Contact:  Fair  Speech:  Clear and Coherent  Volume:  Normal  Mood:  Anxious and Depressed  Affect:  Congruent  Thought Process:  Goal Directed and Descriptions of Associations: Intact  Orientation:  Full  (Time, Place, and Person)  Thought Content: Logical   Suicidal Thoughts:  No  Homicidal Thoughts:  No  Memory:  Immediate;   Fair Recent;   Fair Remote;   Limited  Judgement:  Fair  Insight:  Fair  Psychomotor Activity:  Normal  Concentration:  Concentration: Fair and Attention Span: Fair  Recall:  [employer] of Knowledge: Fair  Language: Fair  Akathisia:  No  Handed:  Left  AIMS (if indicated): done, 0  Assets:  Communication Skills Desire for Improvement Housing Social Support  ADL's:  Intact  Cognition: WNL  Sleep:  Fair   Screenings: AIMS    Flowsheet Row Video Visit from 06/04/2021 in [facility] Visit from 01/28/2021 in [facility] Visit from 10/24/2018 in [facility] Visit from 04/04/2018 in [facility] Visit from 01/03/2018 in [facility] Total Score 0 0 5 0 0      GAD-7    Flowsheet Row Video Visit from 06/25/2021 in [facility] from 03/10/2021 in [facility]  Total GAD-7 Score 17 12      PHQ2-9    Flowsheet Row Video Visit from 12/24/2021 in [facility] Visit from 10/08/2021 in [facility] from 08/18/2021 in [facility] Visit from 01/28/2021 in [facility] Video Visit from 12/16/2020 in [facility]  PHQ-2 Total Score 5 5 3 6 5   PHQ-9 Total Score 13 9 10 15 20       Flowsheet Row Video Visit from 12/24/2021 in [facility] Office Visit from 10/08/2021 in [facility] Visit from 08/18/2021 in [facility] No Risk No Risk         Assessment and Plan: Stacy Chambers is a 56 year old Caucasian female, widowed, lives in [place], has a history of bipolar disorder, anxiety disorder, COPD, RLS, vitamin B12 deficiency was evaluated by telemedicine today.  Patient is currently struggling with depression, anxiety, will benefit from the following plan.  Plan Bipolar disorder type I mixed, moderate-unstable Start Vraylar 1.5 mg p.o. daily Wellbutrin XL 150 mg p.o. daily Depakote ER 1000 mg p.o. daily Trazodone 100-200 mg p.o. nightly for sleep  GAD-unstable Patient referred for more intensive therapy, IOP.  Provided community resources Will add Vraylar as noted above Depakote ER 1000 mg p.o. daily Klonopin 0.5 mg up to 2-3 times a week only for severe panic symptoms Hydroxyzine 50 mg p.o. twice daily as needed Continue CBT with Mr. Maye Hides  [facility] Currently is in a support group.  Continue CBT  RLS-improving We will monitor closely  Weight gain likely multifactorial-improving Continue diet management and exercise  Follow-up in clinic in 3 weeks or sooner if needed.   Collaboration of Care: Collaboration of Care:  Referral or follow-up with counselor/therapist AEB provided community resources for intensive outpatient program.  Encouraged to continue to follow up with her therapist.  Patient/Guardian was advised Release of Information must be obtained prior to any record release in order to collaborate their care with an outside provider. Patient/Guardian was advised if they have not already done so to contact the registration department to sign all necessary forms in order for [name] to release information regarding their care.   Consent: Patient/Guardian gives verbal consent for treatment and assignment of benefits for services provided during this visit. Patient/Guardian expressed understanding and agreed to proceed.   This note was generated in part or whole with voice recognition  software. Voice recognition is usually quite accurate but there are transcription errors that can and very often do occur. I apologize for any typographical errors that were not detected and corrected.      Ursula Alert, MD 12/24/2021, 3:00 PM

## 2022-01-10 ENCOUNTER — Other Ambulatory Visit: Payer: Self-pay | Admitting: Psychiatry

## 2022-01-10 DIAGNOSIS — F411 Generalized anxiety disorder: Secondary | ICD-10-CM

## 2022-01-10 DIAGNOSIS — F3131 Bipolar disorder, current episode depressed, mild: Secondary | ICD-10-CM

## 2022-01-12 ENCOUNTER — Other Ambulatory Visit: Payer: Self-pay

## 2022-01-12 ENCOUNTER — Ambulatory Visit (INDEPENDENT_AMBULATORY_CARE_PROVIDER_SITE_OTHER): Payer: Medicare HMO | Admitting: Clinical

## 2022-01-12 DIAGNOSIS — F411 Generalized anxiety disorder: Secondary | ICD-10-CM

## 2022-01-12 DIAGNOSIS — F5105 Insomnia due to other mental disorder: Secondary | ICD-10-CM

## 2022-01-12 DIAGNOSIS — R41840 Attention and concentration deficit: Secondary | ICD-10-CM | POA: Diagnosis not present

## 2022-01-12 DIAGNOSIS — F3132 Bipolar disorder, current episode depressed, moderate: Secondary | ICD-10-CM
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
## 2022-01-13 ENCOUNTER — Telehealth (INDEPENDENT_AMBULATORY_CARE_PROVIDER_SITE_OTHER): Payer: Medicare HMO | Admitting: Psychiatry

## 2022-01-13 ENCOUNTER — Encounter: Payer: Self-pay | Admitting: Psychiatry

## 2022-01-13 ENCOUNTER — Other Ambulatory Visit: Payer: Self-pay

## 2022-01-13 DIAGNOSIS — G3184 Mild cognitive impairment, so stated: Secondary | ICD-10-CM | POA: Diagnosis not present

## 2022-01-13 DIAGNOSIS — Z634 Disappearance and death of family member: Secondary | ICD-10-CM

## 2022-01-13 DIAGNOSIS — R635 Abnormal weight gain: Secondary | ICD-10-CM

## 2022-01-13 DIAGNOSIS — F411 Generalized anxiety disorder: Secondary | ICD-10-CM | POA: Diagnosis not present

## 2022-01-13 DIAGNOSIS — F5105 Insomnia due to other mental disorder: Secondary | ICD-10-CM | POA: Diagnosis not present

## 2022-01-13 DIAGNOSIS — F3176 Bipolar disorder, in full remission, most recent episode depressed: Secondary | ICD-10-CM

## 2022-01-13 DIAGNOSIS — G2581 Restless legs syndrome: Secondary | ICD-10-CM | POA: Diagnosis not present

## 2022-01-13 MED ORDER — HYDROXYZINE HCL 50 MG PO TABS: ORAL_TABLET | ORAL | 0 refills | Status: DC

## 2022-01-13 MED ORDER — DIVALPROEX SODIUM ER 500 MG PO TB24: 1000.0000 mg | ORAL_TABLET | Freq: Every day | ORAL | 0 refills | Status: DC

## 2022-01-13 NOTE — Progress Notes (Signed)
Virtual Visit via Video Note  I connected with Stacy Chambers on 01/13/22 at  4:30 PM EDT by a video enabled telemedicine application and verified that I am speaking with the correct person using two identifiers.  Location Provider Location : [facility] Patient Location : Home  Participants: Patient , Provider   I discussed the limitations of evaluation and management by telemedicine and the availability of in person appointments. The patient expressed understanding and agreed to proceed.    I discussed the assessment and treatment plan with the patient. The patient was provided an opportunity to ask questions and all were answered. The patient agreed with the plan and demonstrated an understanding of the instructions.   The patient was advised to call back or seek an in-person evaluation if the symptoms worsen or if the condition fails to improve as anticipated.   BH MD OP Progress Note  01/13/2022 4:55 PM MADONNA TRATHEN  MRN:  [id number]  Chief Complaint:  Chief Complaint  Patient presents with   Follow-up: 56 year old Caucasian female with history of depression, anxiety, complicated by recent grief reaction to her husband's loss, presented for medication management.   HPI: Stacy Chambers is a 56 year old Caucasian female, lives in [place], has a history of bipolar disorder, GAD, insomnia, bereavement, RLS, MCI, weight gain, memory loss, vitamin B12 deficiency was evaluated by telemedicine today.  Patient today reports she did not start Vraylar as discussed last visit.  Patient reports her mood symptoms started getting better.  Patient reports she does have grief reaction to the loss of her husband however that also has been getting better.  She reports she has been motivated to get out more, has had more energy, and feels her depression has improved.  Reports sleep is good.  Currently does not take the trazodone since she felt the trazodone made her groggy and sleep excessively  during the day.  Patient does have anxiety, worries about things in general like paying her credit card bills .  She reports she continues to follow-up with her therapist and had an appointment yesterday.  That helps.  Patient denies any suicidality, homicidality or perceptual disturbances.  Patient denies any other concerns today.  Visit Diagnosis:    ICD-10-CM   1. Bipolar disorder, in full remission, most recent episode depressed (HCC)  F31.76     2. GAD (generalized anxiety disorder)  F41.1 divalproex (DEPAKOTE ER) 500 MG 24 hr tablet    3. Insomnia due to mental disorder  F51.05    mood    4. Bereavement  Z63.4 hydrOXYzine (ATARAX) 50 MG tablet    5. Restless legs syndrome (RLS)  G25.81     6. MCI (mild cognitive impairment)  G31.84     7. Weight gain  R63.5    likely secondary to medications      Past Psychiatric History: Reviewed past psychiatric history from progress note on 10/12/2018.  Past trials of BuSpar, Zoloft, carbamazepine.  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   Chronic obstructive pulmonary disease (HCC) 10/03/2015   COPD (chronic obstructive pulmonary disease) (HCC)  no inhalers   Elevated liver function tests 11/26/2004   H/O NEGATIVE LIVER BIOPSY, workup negative   Gallstones 02/21/2015   GERD (gastroesophageal reflux disease)    Hyperlipidemia    Insomnia    Lower back pain    Osteoarthritis    Ovarian cancer (HCC) 1995   RLS (restless legs syndrome)    Sedative, hypnotic or anxiolytic dependence with withdrawal, 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HCC)    Wears dentures    full upper and lower    Past Surgical History:  Procedure  Laterality Date   ABDOMINAL HYSTERECTOMY     APPENDECTOMY     BREAST BIOPSY Left 03/12/2015   fat necrosis   CESAREAN SECTION     COLONOSCOPY WITH PROPOFOL N/A 05/30/2018   Procedure: COLONOSCOPY WITH PROPOFOL;  Surgeon: Scot Jun, MD;  Location: [facility] END[facility];  Service: Endoscopy;  Laterality: N/A;   EXCISION MORTON'S NEUROMA Left 10/07/2016   Procedure: EXCISION MORTON'S NEUROMA;  Surgeon: Gwyneth Revels, DPM;  Location: [facility];  Service: Podiatry;  Laterality: Left;  IV WITH LOCAL   LIVER BIOPSY  2005   PerPt: Done to eval elevated LFTs and biopsy provided no definite dx/cause of elevated LFTs   MULTIPLE TOOTH EXTRACTIONS     OOPHORECTOMY     OSTECTOMY Right 04/10/2020   Procedure: DOUBLE OSTEOTOMY GENERAL W/ LOCAL;  Surgeon: Gwyneth Revels, DPM;  Location: [facility];  Service: Podiatry;  Laterality: Right;    Family Psychiatric History: Reviewed family psychiatric history from progress note on 10/12/2018.  Family History:  Family History  Adopted: Yes  Problem Relation Age of Onset   COPD Mother    Depression Mother    Colon cancer Mother        unsure of age   Cancer Father        Lung CA, Skin Cancer--?type   COPD Father    Stroke Father    Cancer Sister 72       cervical cancer   COPD Brother    Alcohol abuse Brother    Stroke Brother    COPD Maternal Aunt    Depression Daughter    Schizophrenia Son    Breast cancer Neg Hx     Social History: Reviewed social history from progress note on 10/12/2018. Social History   Socioeconomic History   Marital status: Widowed    Spouse name: Not on file   Number of children: Not on file   Years of education: Not on file   Highest education level: Not on file  Occupational History   Not on file  Tobacco Use   Smoking status: Former    Packs/day: 1.00    Years: 30.00    Pack years: 30.00    Types: Cigarettes    Quit date: 04/08/2009    Years since quitting: 12.7   Smokeless tobacco:  Never  Vaping Use   Vaping Use: Never used  Substance and Sexual Activity   Alcohol use: Not Currently    Alcohol/week: 3.0 - 7.0 standard drinks    Types: 3 - 7 Shots of liquor per week    Comment: occasionally   Drug use: Not Currently    Types: Marijuana    Comment: told to refrain until after surgery   Sexual activity: Yes    Birth control/protection: Surgical  Other Topics Concern   Not on file  Social History Narrative   Not on file   Social Determinants of Health   Financial Resource Strain: Low Risk  Difficulty of Paying Living Expenses: Not very hard  Food Insecurity: Not on file  Transportation Needs: Not on file  Physical Activity: Not on file  Stress: Not on file  Social Connections: Not on file    Allergies:  Allergies  Allergen Reactions   Benadryl [Diphenhydramine Hcl]     Hives/ rapid heartrate   Codeine     dizziness   Vicodin [Hydrocodone-Acetaminophen] Other (See Comments)    dizziness    Metabolic Disorder Labs: Lab Results  Component Value Date   HGBA1C 5.6 09/14/2018   MPG 114 09/14/2018   No results found for: PROLACTIN Lab Results  Component Value Date   CHOL 167 06/18/2020   TRIG 165 (H) 06/18/2020   HDL 54 06/18/2020   CHOLHDL 3.1 06/18/2020   VLDL 18 03/04/2015   LDLCALC 87 06/18/2020   LDLCALC 73 12/19/2019   Lab Results  Component Value Date   TSH 1.51 12/19/2019   TSH 2.320 10/24/2018    Therapeutic Level Labs: Lab Results  Component Value Date   LITHIUM 0.3 (L) 10/24/2018   LITHIUM 0.49 (L) 01/13/2009   Lab Results  Component Value Date   VALPROATE 73 10/08/2021   VALPROATE 27 (L) 09/22/2021   No components found for:  CBMZ  Current Medications: Current Outpatient Medications  Medication Sig Dispense Refill   alendronate (FOSAMAX) 70 MG tablet TAKE 1 TABLET (70 MG TOTAL) BY MOUTH ONCE A WEEK. TAKE WITH A FULL GLASS OF WATER ON AN EMPTY STOMACH. 12 tablet 3   ammonium lactate (AMLACTIN) 12 % cream APPLY  TOPICALLY AS NEEDED FOR DRY SKIN 385 g 0   budesonide-formoterol (SYMBICORT) 160-4.5 MCG/ACT inhaler Inhale 2 puffs into the lungs 2 (two) times daily. 3 each 3   buPROPion (WELLBUTRIN XL) 150 MG 24 hr tablet Take 1 tablet (150 mg total) by mouth daily. 90 tablet 2   clonazePAM (KLONOPIN) 0.5 MG tablet TAKE 1 TABLET 2 TO 3 TIMES A WEEK AS DIRECTED FOR SEVERE PANIC ATTACKS ONLY 13 tablet 1   divalproex (DEPAKOTE ER) 500 MG 24 hr tablet Take 2 tablets (1,000 mg total) by mouth daily. 180 tablet 0   esomeprazole (NEXIUM) 20 MG capsule Take 1 capsule (20 mg total) by mouth daily as needed (take on empty stomach). 90 capsule 1   FLULAVAL QUADRIVALENT 0.5 ML injection      fluticasone (FLONASE) 50 MCG/ACT nasal spray Place 2 sprays into both nostrils daily. 16 mL 2   hydrochlorothiazide (HYDRODIURIL) 12.5 MG tablet      hydrOXYzine (ATARAX) 50 MG tablet TAKE 1 TABLET TWICE DAILY AS NEEDED FOR SEVERE ANXIETY ATTACKS 180 tablet 0   meloxicam (MOBIC) 7.5 MG tablet TAKE 1 TABLET TWICE DAILY AS NEEDED FOR PAIN 180 tablet 0   methocarbamol (ROBAXIN) 500 MG tablet      rosuvastatin (CRESTOR) 10 MG tablet TAKE 1 TABLET EVERY DAY 90 tablet 3   No current facility-administered medications for this visit.     Musculoskeletal: Strength & Muscle Tone:  UTA Gait & Station:  Seated Patient leans: N/A  Psychiatric Specialty Exam: Review of Systems  Psychiatric/Behavioral:  The patient is nervous/anxious.        Grieving  All other systems reviewed and are negative.  There were no vitals taken for this visit.There is no height or weight on file to calculate BMI.  General Appearance: Casual  Eye Contact:  Fair  Speech:  Clear and Coherent  Volume:  Normal  Mood:   Grieving, anxious  Affect:  Congruent  Thought Process:  Goal Directed and Descriptions of Associations: Intact  Orientation:  Full (Time, Place, and Person)  Thought Content: Logical   Suicidal Thoughts:  No  Homicidal Thoughts:  No   Memory:  Immediate;   Fair Recent;   Fair Remote;   limited  Judgement:  Fair  Insight:  Fair  Psychomotor Activity:  Normal  Concentration:  Concentration: Fair and Attention Span: Fair  Recall:  [employer] of Knowledge: Fair  Language: Fair  Akathisia:  No  Handed:  Left  AIMS (if indicated): done, 0  Assets:  Communication Skills Desire for Improvement Housing Social Support  ADL's:  Intact  Cognition: WNL  Sleep:  Fair   Screenings: AIMS    Flowsheet Row Video Visit from 06/04/2021 in [facility] Office Visit from 01/28/2021 in [facility] Office Visit from 10/24/2018 in [facility] Office Visit from 04/04/2018 in [facility] Office Visit from 01/03/2018 in [facility]  AIMS Total Score 0 0 5 0 0      GAD-7    Flowsheet Row Video Visit from 01/13/2022 in [facility] Video Visit from 06/25/2021 in [facility] Counselor from [facility]/10/2021 [facility] [facility]  Total GAD-7 Score 7 17 12       PHQ2-9    Flowsheet Row Video Visit from 01/13/2022 in [facility] Video Visit from 12/24/2021 in [facility] Office Visit from 10/08/2021 in [facility] Office Visit from 08/18/2021 in [facility] Office Visit from 01/28/2021 in [facility]  PHQ-2 Total Score 1 5 5 3 6   PHQ-9 Total Score 6 13 9 10 15       Flowsheet Row Video Visit from 12/24/2021 in [facility] Office Visit from 10/08/2021 in [facility] Office Visit from 08/18/2021 in [facility]  C-SSRS RISK CATEGORY Low Risk No Risk No Risk        Assessment and Plan: BEATRICE MIRE is a 56 year old Caucasian female, widowed, lives in [place], has a history of bipolar disorder, anxiety disorder, COPD, RLS, vitamin B12 deficiency was evaluated by telemedicine today.  Patient noncompliant with vraylar, reports her mood symptoms as improving on the current medication regimen.  Plan as noted below.  Plan Bipolar disorder in remission Discontinue Vraylar for noncompliance. Wellbutrin XL 150 mg p.o. daily Depakote ER 1000 mg p.o. daily Depakote level-10/09/2019 22-73-therapeutic. Trazodone 100 -200 mg p.o. nightly for sleep  GAD-improving Depakote ER 1000 mg p.o. daily Klonopin 0.5 mg up to 2-3 times a week only for severe panic symptoms Hydroxyzine 50 mg p.o. twice daily as needed Continue CBT with Mr. Suzan Garibaldi  Bereavement-improving Continue support group.  Continue CBT  RLS-improving Will monitor closely  Weight gain likely secondary to medications-improving Continue diet management.  Continue lifestyle modification.  Follow-up in clinic in 6 to 8 weeks or sooner if needed.    Collaboration of Care: Collaboration of Care: Referral or follow-up with counselor/therapist AEB encouraged to continue to follow up with therapist.  Patient/Guardian was advised Release of Information must be obtained prior to any record release in order to collaborate their care with an outside provider. Patient/Guardian was advised if they have not already done so to contact the registration department to sign all necessary forms in order for [name] to release information regarding their care.  Consent: Patient/Guardian gives verbal consent for treatment and assignment of benefits for services provided during this visit. Patient/Guardian expressed understanding and agreed to proceed.   This note was generated in part or whole with voice recognition software. Voice recognition is usually quite accurate but there are transcription errors that can and very often do occur. I  apologize for any typographical errors that were not detected and corrected.      Jomarie Longs, MD 01/14/2022, 8:09 AM

## 2022-01-27 ENCOUNTER — Other Ambulatory Visit: Payer: Self-pay | Admitting: Psychiatry

## 2022-01-27 DIAGNOSIS — F411 Generalized anxiety disorder: Secondary | ICD-10-CM

## 2022-01-28 ENCOUNTER — Other Ambulatory Visit: Payer: Self-pay | Admitting: Psychiatry

## 2022-02-03 ENCOUNTER — Ambulatory Visit (INDEPENDENT_AMBULATORY_CARE_PROVIDER_SITE_OTHER): Payer: Medicare HMO | Admitting: Clinical

## 2022-02-03 DIAGNOSIS — F411 Generalized anxiety disorder: Secondary | ICD-10-CM | POA: Diagnosis not present

## 2022-02-03 DIAGNOSIS — F5105 Insomnia due to other mental disorder: Secondary | ICD-10-CM

## 2022-02-03 DIAGNOSIS — F3176 Bipolar disorder, in full remission, most recent episode depressed: Secondary | ICD-10-CM | POA: Diagnosis not present

## 2022-02-03 NOTE — Progress Notes (Signed)
Virtual Visit via Video Note ?  ?I connected with Stacy Chambers on 02/03/2022 at  10:00 AM EST by a video enabled telemedicine application and verified that I am speaking with the correct person using two identifiers. ?  ?Location: ?Patient: Home ?Provider: Office ?  ?I discussed the limitations of evaluation and management by telemedicine and the availability of in person appointments. The patient expressed understanding and agreed to proceed. ?  ?THERAPIST PROGRESS NOTE ?  ?Session Time: 10:00 AM-10:30 AM ?  ?Participation Level: Active ?  ?Behavioral Response: CasualAlertDepressed ?  ?Type of Therapy: Individual Therapy ?  ?Treatment Goals addressed: Coping ?  ?Interventions: CBT ?  ?Summary: Stacy Chambers is a 56 y.o. female who presents with Bipolar Disorder./ GAD/Insomnia. The OPT therapist worked with the patient for her ongoing OPT treatment session. The OPT therapist utilized Motivational Interviewing to assist in creating therapeutic repore. The patient in the session was engaged and work in collaboration giving feedback about her triggers and symptoms over the past few weeks. The patient is currently struggling with allergies.The patient spoke about the ongoing impact of the patients brother and his girlfriend living in the home. The patient spoke about seeing her daughter for Easter.The OPT therapist utilized Cognitive Behavioral Therapy through cognitive restructuring as well as worked with the patient on coping strategies to assist in management of mood. The OPT therapist worked with the patient on continuing to maintain her basic needs of self care with eating, hygiene, and sleep. The OPT therapist reviewed with the patient most recent meeting with psychiatrist Dr. Shea Evans and gained the patients feedback about how things have been going in her Medication Therapy program. ?  ?Suicidal/Homicidal: Nowithout intent/plan ?  ?Therapist Response: The OPT therapist worked with the patient for the patients  scheduled session. The patient was engaged in her session and gave feedback in relation to triggers, symptoms, and behavior responses over the past few weeks. The OPT therapist worked with the patient utilizing an in session Cognitive Behavioral Therapy exercise. The patient was responsive in the session and verbalized," The medicine that I am taking currently I think is working I am not fighting as much with my brother".  The OPT therapist worked with the patient who has been working more at home but limited in completed task due to suffering from allergies and has been more isolated due to the cost of gas.The OPT therapist encouraged the patient to speak up for her self and to set boundaries in the home.The OPT therapist will continue treatment work with the patient in her next scheduled session  ?  ?Plan: Return again in 3 weeks. ?  ?Diagnosis:      Axis I: Bipolar 1 disorder,depressed mild/GAD/Insomnia/ Attention deficit.   ?  ?                        Axis II: No diagnosis ?  ?  ?Collaboration of Care: collaboration of care reviewed with medication therapy involvement with psychiatrist Dr. Shea Evans. ?  ?Patient/Guardian was advised Release of Information must be obtained prior to any record release in order to collaborate their care with an outside provider. Patient/Guardian was advised if they have not already done so to contact the registration department to sign all necessary forms in order for [name] to release information regarding their care.  ?  ?Consent: Patient/Guardian gives verbal consent for treatment and assignment of benefits for services provided during this visit. Patient/Guardian expressed understanding and  agreed to proceed.  ?  ?  ?  ?I discussed the assessment and treatment plan with the patient. The patient was provided an opportunity to ask questions and all were answered. The patient agreed with the plan and demonstrated an understanding of the instructions. ?  ?The patient was advised to call  back or seek an in-person evaluation if the symptoms worsen or if the condition fails to improve as anticipated. ?  ?I provided 30 minutes of non-face-to-face time during this encounter. ?  ?[name], LCSW ?  ?02/03/2022 ?

## 2022-02-05 DIAGNOSIS — T7840XA Allergy, unspecified, initial encounter: Secondary | ICD-10-CM | POA: Diagnosis not present

## 2022-02-05 DIAGNOSIS — Z6830 Body mass index (BMI) 30.0-30.9, adult: Secondary | ICD-10-CM | POA: Diagnosis not present

## 2022-02-09 ENCOUNTER — Other Ambulatory Visit: Payer: Self-pay | Admitting: Psychiatry

## 2022-02-09 DIAGNOSIS — Z634 Disappearance and death of family member: Secondary | ICD-10-CM

## 2022-02-26 ENCOUNTER — Other Ambulatory Visit: Payer: Self-pay | Admitting: Family Medicine

## 2022-02-26 ENCOUNTER — Other Ambulatory Visit: Payer: Self-pay | Admitting: Psychiatry

## 2022-02-26 DIAGNOSIS — F411 Generalized anxiety disorder: Secondary | ICD-10-CM

## 2022-02-26 DIAGNOSIS — F3131 Bipolar disorder, current episode depressed, mild: Secondary | ICD-10-CM

## 2022-02-27 NOTE — Telephone Encounter (Signed)
Requested medications are due for refill today.  unsure ? ?Requested medications are on the active medications list.  yes ? ?Last refill. 03/03/2021 #180 0 refills ? ?Future visit scheduled.   no ? ?Notes to clinic.  Labs are expired. Pt last seen over 1 year ago. Ileene Hutchinson listed as PCP. ? ? ? ?Requested Prescriptions  ?Pending Prescriptions Disp Refills  ? meloxicam (MOBIC) 7.5 MG tablet [Pharmacy Med Name: MELOXICAM 7.5 MG Tablet] 180 tablet 0  ?  Sig: TAKE 1 TABLET TWICE DAILY AS NEEDED FOR PAIN (NEEDS TO ESTABLISH CARE WITH NEW PROVIDER FOR FUTURE REFILLS)  ?  ? Analgesics:  COX2 Inhibitors Failed - 02/26/2022  7:10 PM  ?  ?  Failed - Manual Review: Labs are only required if the patient has taken medication for more than 8 weeks.  ?  ?  Failed - HGB in normal range and within 360 days  ?  Hemoglobin  ?Date Value Ref Range Status  ?06/18/2020 12.7 11.7 - 15.5 g/dL Final  ?10/24/2018 12.6 11.1 - 15.9 g/dL Final  ?  ?  ?  ?  Failed - Cr in normal range and within 360 days  ?  Creat  ?Date Value Ref Range Status  ?06/18/2020 0.78 0.50 - 1.05 mg/dL Final  ?  Comment:  ?  For patients >51 years of age, the reference limit ?for Creatinine is approximately 13% higher for people ?identified as African-American. ?. ?  ?  ?  ?  ?  Failed - HCT in normal range and within 360 days  ?  HCT  ?Date Value Ref Range Status  ?06/18/2020 38.7 35.0 - 45.0 % Final  ? ?Hematocrit  ?Date Value Ref Range Status  ?10/24/2018 36.1 34.0 - 46.6 % Final  ?  ?  ?  ?  Failed - eGFR is 30 or above and within 360 days  ?  GFR, Est African American  ?Date Value Ref Range Status  ?02/01/2019 76 > OR = 60 mL/min/1.12m Final  ? ?GFR, Est Non African American  ?Date Value Ref Range Status  ?02/01/2019 66 > OR = 60 mL/min/1.758mFinal  ?  ?  ?  ?  Failed - Valid encounter within last 12 months  ?  Recent Outpatient Visits   ? ?      ? 1 year ago Pulmonary emphysema, unspecified emphysema type (HCGuffey ? [facility] [place] [facility]   ? 2 years ago Chronic pain of right hip  ? [place]  ? 2 years ago Routine general medical [facility] at a health care facility  [facility] [place] [facility]  ? 2 years ago [facility]  [facility] [place] [facility]  ? 2 years ago Diverticulitis  ? [place]  ? ?  ?  ? ? ?  ?  ?  Passed - AST in normal range and within 360 days  ?  AST  ?Date Value Ref Range Status  ?09/30/2021 24 15 - 41 U/L Final  ?  ?  ?  ?  Passed - ALT in normal range and within 360 days  ?  ALT  ?Date Value Ref Range Status  ?09/30/2021 25 0 - 44 U/L Final  ?  ?  ?  ?  Passed - Patient is not pregnant  ?  ?  ?  ?

## 2022-03-02 ENCOUNTER — Ambulatory Visit: Payer: Medicare HMO | Admitting: Family Medicine

## 2022-03-03 ENCOUNTER — Ambulatory Visit (INDEPENDENT_AMBULATORY_CARE_PROVIDER_SITE_OTHER): Payer: Medicare HMO | Admitting: Clinical

## 2022-03-03 DIAGNOSIS — F5105 Insomnia due to other mental disorder: Secondary | ICD-10-CM

## 2022-03-03 DIAGNOSIS — F411 Generalized anxiety disorder: Secondary | ICD-10-CM

## 2022-03-03 DIAGNOSIS — F3176 Bipolar disorder, in full remission, most recent episode depressed: Secondary | ICD-10-CM | POA: Diagnosis not present

## 2022-03-03 NOTE — Progress Notes (Signed)
Virtual Visit via Video Note ? ?I connected with Stacy Chambers on 03/03/22 at 10:00 AM EDT by a video enabled telemedicine application and verified that I am speaking with the correct person using two identifiers. ? ?Location: ?Patient: Home ?Provider: Office ?  ?I discussed the limitations of evaluation and management by telemedicine and the availability of in person appointments. The patient expressed understanding and agreed to proceed. ? ? ?  ? ? ?Comprehensive Clinical Assessment (CCA) Note ? ?03/03/2022 ?Stacy Chambers ?[id number] ? ?Chief Complaint: Bipolar, GAD, Insomnia ?Visit Diagnosis: Bipolar , GAD , Insomnia ? ? ?CCA Screening, Triage and Referral (STR) ? ?Patient Reported Information ?How did you hear about [name]? No data recorded ?Referral name: No data recorded ?Referral phone number: No data recorded ? ?Whom do you see for routine medical problems? No data recorded ?Practice/Facility Name: No data recorded ?Practice/Facility Phone Number: No data recorded ?Name of Contact: No data recorded ?Contact Number: No data recorded ?Contact Fax Number: No data recorded ?Prescriber Name: No data recorded ?Prescriber Address (if known): No data recorded ? ?What Is the Reason for Your Visit/Call Today? No data recorded ?How Long Has This Been Causing You Problems? No data recorded ?What Do You Feel Would Help You the Most Today? No data recorded ? ?Have You Recently Been in Any Inpatient Treatment (Hospital/Detox/Crisis Center/28-Day Program)? No data recorded ?Name/Location of Program/Hospital:No data recorded ?How Long Were You There? No data recorded ?When Were You Discharged? No data recorded ? ?Have You Ever Received Services From [employer] Before? No data recorded ?Who Do You See at [facility]? No data recorded ? ?Have You Recently Had Any Thoughts About Hurting Yourself? No data recorded ?Are You Planning to Commit Suicide/Harm Yourself At This time? No data recorded ? ?Have you Recently Had Thoughts  About [place]? No data recorded ?Explanation: No data recorded ? ?Have You Used Any Alcohol or Drugs in the Past 24 Hours? No data recorded ?How Long Ago Did You Use Drugs or Alcohol? No data recorded ?What Did You Use and How Much? No data recorded ? ?Do You Currently Have a Therapist/Psychiatrist? No data recorded ?Name of Therapist/Psychiatrist: No data recorded ? ?Have You Been Recently Discharged From Any Office Practice or Programs? No data recorded ?Explanation of Discharge From Practice/Program: No data recorded ? ?  ?CCA Screening Triage Referral Assessment ?Type of Contact: No data recorded ?Is this Initial or Reassessment? No data recorded ?Date Telepsych consult ordered in [facility]:  No data recorded ?Time Telepsych consult ordered in CHL:  No data recorded ? ?Patient Reported Information Reviewed? No data recorded ?Patient Left Without Being Seen? No data recorded ?Reason for Not Completing Assessment: No data recorded ? ?Collateral Involvement: No data recorded ? ?Does Patient Have a Stage manager Guardian? No data recorded ?Name and Contact of Legal Guardian: No data recorded ?If Minor and Not Living with Parent(s), Who has Custody? No data recorded ?Is CPS involved or ever been involved? No data recorded ?Is APS involved or ever been involved? No data recorded ? ?Patient Determined To Be At Risk for Harm To Self or Others Based on Review of Patient Reported Information or Presenting Complaint? No data recorded ?Method: No data recorded ?Availability of Means: No data recorded ?Intent: No data recorded ?Notification Required: No data recorded ?Additional Information for Danger to Others Potential: No data recorded ?Additional Comments for Danger to Others Potential: No data recorded ?Are There Guns or Other Weapons in [place]? No  data recorded ?Types of Guns/Weapons: No data recorded ?Are These Weapons Safely Secured?                            No data recorded ?Who Could Verify You  Are Able To Have These Secured: No data recorded ?Do You Have any Outstanding Charges, Pending Court Dates, Parole/Probation? No data recorded ?Contacted To Inform of Risk of Harm To Self or Others: No data recorded ? ?Location of Assessment: No data recorded ? ?Does Patient Present under Involuntary Commitment? No data recorded ?IVC Papers Initial File Date: No data recorded ? ?[place] of Residence: No data recorded ? ?Patient Currently Receiving the Following Services: No data recorded ? ?Determination of Need: No data recorded ? ?Options For Referral: No data recorded ? ? ? ?CCA Biopsychosocial ?Intake/Chief Complaint:  Bi Polar 1, GAD, ADHD ? ?Current Symptoms/Problems: The patient notes, " I have mania". ? ? ?Patient Reported Schizophrenia/Schizoaffective Diagnosis in Past: No ? ? ?Strengths: get along well with others when i know them. ? ?Preferences: Hanging out with friends, shopping ? ?Abilities: Shopping ? ? ?Type of Services Patient Feels are Needed: Medication Mangement (currently with Dr. Shea Evans) and Individual Therapy ? ? ?Initial Clinical Notes/Concerns: Prior indication of Bipolar, GAD, ADHD . No prior Hospitalizations, No current S/I or H/I ? ? ?Mental Health Symptoms ?Depression:   ?Irritability; Sleep (too much or little); Difficulty Concentrating; Weight gain/loss; Change in energy/activity ?  ?Duration of Depressive symptoms:  ?Greater than two weeks ?  ?Mania:   ?Change in energy/activity; Increased Energy; Racing thoughts; Irritability; Euphoria ?  ?Anxiety:    ?Difficulty concentrating; Irritability; Restlessness; Sleep; Tension; Worrying ?  ?Psychosis:   ?None ?  ?Duration of Psychotic symptoms: NA  ?Trauma:   ?None ?  ?Obsessions:   ?None ?  ?Compulsions:   ?None ?  ?Inattention:   ?None (Prior indication of ADHD diagnosis per psychiatrist) ?  ?Hyperactivity/Impulsivity:   ?None ?  ?Oppositional/Defiant Behaviors:   ?None ?  ?Emotional Irregularity:   ?None ?  ?Other Mood/Personality  Symptoms:   ?None ?  ? ?Mental Status Exam ?Appearance and self-care  ?Stature:   ?Average ?  ?Weight:   ?Overweight ?  ?Clothing:   ?Casual ?  ?Grooming:   ?Normal ?  ?Cosmetic use:   ?Age appropriate ?  ?Posture/gait:   ?Normal ?  ?Motor activity:   ?Not Remarkable ?  ?Sensorium  ?Attention:   ?Normal ?  ?Concentration:   ?Normal ?  ?Orientation:   ?X5 ?  ?Recall/memory:   ?Normal ?  ?Affect and Mood  ?Affect:   ?Appropriate ?  ?Mood:   ?Euphoric ?  ?Relating  ?Eye contact:   ?Normal ?  ?Facial expression:   ?Responsive ?  ?Attitude toward examiner:   ?Cooperative ?  ?Thought and Language  ?Speech flow:  ?Normal ?  ?Thought content:   ?Appropriate to Mood and Circumstances ?  ?Preoccupation:   ?None ?  ?Hallucinations:   ?None ?  ?Organization:  Logical  ?Community education officer  ?Fund of Knowledge:   ?Good ?  ?Intelligence:   ?Average ?  ?Abstraction:   ?Normal ?  ?Judgement:   ?Good ?  ?Reality Testing:   ?Realistic ?  ?Insight:   ?Good ?  ?Decision Making:   ?Impulsive ?  ?Social Functioning  ?Social Maturity:   ?Responsible ?  ?Social Judgement:   ?Normal ?  ?Stress  ?Stressors:   ?Grief/losses; Illness; Relationship; Family conflict (Sister in  law passed away 2 weeks ago, Son passed away in 02/06/2008, Patient lost her husband August 1st 2022. Patients brother has moved in. [place], Arthritis in Spine,) ?  ?Coping Ability:   ?Normal ?  ?Skill Deficits:   ?None ?  ?Supports:   ?Friends/Service system; Family ?  ? ? ?Religion: ?Religion/Spirituality ?Are You A Religious Person?: No ?How Might This Affect Treatment?: NA ? ?Leisure/Recreation: ?Leisure / Recreation ?Do You Have Hobbies?: Yes ?Leisure and Hobbies: Shopping ? ?Exercise/Diet: ?Exercise/Diet ?Do You Exercise?: No ?Have You Gained or Lost A Significant Amount of Weight in the Past Six Months?: Yes-Gained ?Number of Pounds Gained: 5 ?Do You Follow a Special Diet?: No ?Do You Have Any Trouble Sleeping?: Yes ?Explanation of Sleeping Difficulties: The  patinet has difficulty with maintaining a regulated sleep cycle ? ? ?CCA Employment/Education ?Employment/Work Situation: ?Employment / Work Situation ?Employment Situation: On disability ?Why is Patient on Disabil

## 2022-03-24 ENCOUNTER — Telehealth (INDEPENDENT_AMBULATORY_CARE_PROVIDER_SITE_OTHER): Payer: Medicare HMO | Admitting: Psychiatry

## 2022-03-24 ENCOUNTER — Encounter: Payer: Self-pay | Admitting: Psychiatry

## 2022-03-24 DIAGNOSIS — F3162 Bipolar disorder, current episode mixed, moderate: Secondary | ICD-10-CM | POA: Diagnosis not present

## 2022-03-24 DIAGNOSIS — G2581 Restless legs syndrome: Secondary | ICD-10-CM | POA: Diagnosis not present

## 2022-03-24 DIAGNOSIS — R635 Abnormal weight gain: Secondary | ICD-10-CM | POA: Diagnosis not present

## 2022-03-24 DIAGNOSIS — F3176 Bipolar disorder, in full remission, most recent episode depressed: Secondary | ICD-10-CM | POA: Insufficient documentation

## 2022-03-24 DIAGNOSIS — Z634 Disappearance and death of family member: Secondary | ICD-10-CM

## 2022-03-24 DIAGNOSIS — F5105 Insomnia due to other mental disorder: Secondary | ICD-10-CM | POA: Diagnosis not present

## 2022-03-24 DIAGNOSIS — G3184 Mild cognitive impairment, so stated: Secondary | ICD-10-CM | POA: Diagnosis not present

## 2022-03-24 DIAGNOSIS — F411 Generalized anxiety disorder: Secondary | ICD-10-CM

## 2022-03-24 MED ORDER — HYDROXYZINE HCL 50 MG PO TABS: ORAL_TABLET | ORAL | 0 refills | Status: DC

## 2022-03-24 MED ORDER — ZIPRASIDONE HCL 20 MG PO CAPS: 20.0000 mg | ORAL_CAPSULE | Freq: Every day | ORAL | 0 refills | Status: DC

## 2022-03-24 NOTE — Progress Notes (Signed)
Virtual Visit via Video Note  I connected with Stacy Chambers on 03/24/22 at 10:00 AM EDT by a video enabled telemedicine application and verified that I am speaking with the correct person using two identifiers.  Location Provider Location : [facility] Patient Location : Home  Participants: Patient , Provider   I discussed the limitations of evaluation and management by telemedicine and the availability of in person appointments. The patient expressed understanding and agreed to proceed.   I discussed the assessment and treatment plan with the patient. The patient was provided an opportunity to ask questions and all were answered. The patient agreed with the plan and demonstrated an understanding of the instructions.   The patient was advised to call back or seek an in-person evaluation if the symptoms worsen or if the condition fails to improve as anticipated.    [place] MD OP Progress Note  03/24/2022 2:21 PM SHARNESE HEATH  MRN:  [id number]  Chief Complaint:  Chief Complaint  Patient presents with   Follow-up: 56 year old Caucasian female with history of depression, anxiety, complicated by recent loss of her husband, presented for medication management.   HPI: Stacy Chambers is a 56 year old Caucasian female, widowed, lives in [place], has a history of bipolar disorder, GAD, insomnia, bereavement, MCI, weight gain, memory loss, vitamin B12 deficiency was evaluated by telemedicine today.  Patient today reports she is currently struggling with depressive symptoms, sadness, low motivation, anhedonia as well as anxiety, worrying about things in general, concentration problem.  Patient reports she has a temper and that has been getting worse.  She felt she did better on the Geodon, other than her concerns about weight gain she did not have any other problem with Geodon per her report today.  She would like to go back on it.  Patient denies any suicidality, homicidality or perceptual  disturbances.  Patient is motivated to stay in psychotherapy, agreeable to schedule an appointment with her therapist.  Reports she is currently compliant on her other medications.  Denies side effects.  Denies any other concerns today.  Visit Diagnosis:    ICD-10-CM   1. Bipolar 1 disorder, mixed, moderate (HCC)  F31.62 ziprasidone (GEODON) 20 MG capsule    2. GAD (generalized anxiety disorder)  F41.1 ziprasidone (GEODON) 20 MG capsule    3. Insomnia due to mental disorder  F51.05     4. Bereavement  Z63.4 hydrOXYzine (ATARAX) 50 MG tablet    5. Restless legs syndrome (RLS)  G25.81     6. MCI (mild cognitive impairment)  G31.84     7. Weight gain  R63.5    Multifactorial      Past Psychiatric History: Reviewed past psychiatric history from progress note on 10/12/2018.  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   Chronic obstructive pulmonary disease ([place]) 10/03/2015   COPD (chronic obstructive pulmonary disease) ([place])    no inhalers   Elevated liver function tests 11/26/2004   H/O NEGATIVE LIVER BIOPSY, workup negative   Gallstones 02/21/2015  GERD (gastroesophageal reflux disease)    Hyperlipidemia    Insomnia    Lower back pain    Osteoarthritis    Ovarian cancer ([place]) 1995   RLS (restless legs syndrome)    Sedative, hypnotic or anxiolytic dependence with withdrawal, 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[place])    Wears dentures    full upper and lower    Past Surgical History:  Procedure Laterality Date   ABDOMINAL HYSTERECTOMY      APPENDECTOMY     BREAST BIOPSY Left 03/12/2015   fat necrosis   CESAREAN SECTION     COLONOSCOPY WITH PROPOFOL N/A 05/30/2018   Procedure: COLONOSCOPY WITH PROPOFOL;  Surgeon: Manya Silvas, MD;  [facility];  Service: Endoscopy;  Laterality: N/A;   EXCISION MORTON'S NEUROMA Left 10/07/2016   Procedure: EXCISION MORTON'S NEUROMA;  Surgeon: [name], DPM;  Location: [facility];  Service: Podiatry;  Laterality: Left;  IV WITH LOCAL   LIVER BIOPSY  2005   PerPt: Done to eval elevated LFTs and biopsy provided no definite dx/cause of elevated LFTs   MULTIPLE TOOTH EXTRACTIONS     OOPHORECTOMY     OSTECTOMY Right 04/10/2020   Procedure: DOUBLE OSTEOTOMY GENERAL W/ LOCAL;  Surgeon: [name], DPM;  Location: [facility];  Service: Podiatry;  Laterality: Right;    Family Psychiatric History: Reviewed family psychiatric history from progress note on 10/12/2018.  Family History:  Family History  Adopted: Yes  Problem Relation Age of Onset   COPD Mother    Depression Mother    Colon cancer Mother        unsure of age   25 Father        Lung CA, Skin Cancer--?type   COPD Father    Stroke Father    Cancer Sister 54       cervical cancer   COPD Brother    Alcohol abuse Brother    Stroke Brother    COPD Maternal Aunt    Depression Daughter    Schizophrenia Son    Breast cancer Neg Hx     Social History: Reviewed social history from progress note on 10/12/2018. Social History   Socioeconomic History   Marital status: Widowed    Spouse name: Not on file   Number of children: Not on file   Years of education: Not on file   Highest education level: Not on file  Occupational History   Not on file  Tobacco Use   Smoking status: Former    Packs/day: 1.00    Years: 30.00    Pack years: 30.00    Types: Cigarettes    Quit date: 04/08/2009    Years since quitting: 12.9   Smokeless tobacco: Never  Vaping Use   Vaping Use: Never used   Substance and Sexual Activity   Alcohol use: Not Currently    Alcohol/week: 3.0 - 7.0 standard drinks    Types: 3 - 7 Shots of liquor per week    Comment: occasionally   Drug use: Not Currently    Types: Marijuana    Comment: told to refrain until after surgery   Sexual activity: Yes    Birth control/protection: Surgical  Other Topics Concern   Not on file  Social History Narrative   Not on file   Social Determinants of Health   Financial Resource Strain: Not on file  Food Insecurity: Not on file  Transportation Needs: Not on file  Physical Activity: Not on file  Stress: Not on file  Social Connections: Not on file    Allergies:  Allergies  Allergen Reactions   Benadryl [Diphenhydramine Hcl]     Hives/ rapid heartrate   Codeine     dizziness   Vicodin [Hydrocodone-Acetaminophen] Other (See Comments)    dizziness    Metabolic Disorder Labs: Lab Results  Component Value Date   HGBA1C 5.6 09/14/2018   MPG 114 09/14/2018   No results found for: PROLACTIN Lab Results  Component Value Date   CHOL 167 06/18/2020   TRIG 165 (H) 06/18/2020   HDL 54 06/18/2020   CHOLHDL 3.1 06/18/2020   VLDL 18 03/04/2015   LDLCALC 87 06/18/2020   LDLCALC 73 12/19/2019   Lab Results  Component Value Date   TSH 1.51 12/19/2019   TSH 2.320 10/24/2018    Therapeutic Level Labs: Lab Results  Component Value Date   LITHIUM 0.3 (L) 10/24/2018   LITHIUM 0.49 (L) 01/13/2009   Lab Results  Component Value Date   VALPROATE 73 10/08/2021   VALPROATE 27 (L) 09/22/2021   No components found for:  CBMZ  Current Medications: Current Outpatient Medications  Medication Sig Dispense Refill   ziprasidone (GEODON) 20 MG capsule Take 1 capsule (20 mg total) by mouth daily with breakfast. 90 capsule 0   ammonium lactate (AMLACTIN) 12 % cream APPLY TOPICALLY AS NEEDED FOR DRY SKIN 385 g 0   budesonide-formoterol (SYMBICORT) 160-4.5 MCG/ACT inhaler Inhale 2 puffs into the lungs 2 (two)  times daily. 3 each 3   buPROPion (WELLBUTRIN XL) 150 MG 24 hr tablet Take 1 tablet (150 mg total) by mouth daily. 90 tablet 2   clonazePAM (KLONOPIN) 0.5 MG tablet TAKE 1 TABLET 2 TO 3 TIMES A WEEK AS DIRECTED FOR SEVERE PANIC ATTACKS ONLY 13 tablet 1   divalproex (DEPAKOTE ER) 500 MG 24 hr tablet TAKE 2 TABLETS  DAILY. 180 tablet 0   esomeprazole (NEXIUM) 20 MG capsule Take 1 capsule (20 mg total) by mouth daily as needed (take on empty stomach). 90 capsule 1   FLULAVAL QUADRIVALENT 0.5 ML injection      fluticasone (FLONASE) 50 MCG/ACT nasal spray Place 2 sprays into both nostrils daily. 16 mL 2   hydrochlorothiazide (HYDRODIURIL) 12.5 MG tablet      hydrOXYzine (ATARAX) 50 MG tablet TAKE 1 TABLET TWICE DAILY AS NEEDED FOR SEVERE ANXIETY ATTACKS 180 tablet 0   meloxicam (MOBIC) 7.5 MG tablet TAKE 1 TABLET TWICE DAILY AS NEEDED FOR PAIN 180 tablet 0   methocarbamol (ROBAXIN) 500 MG tablet      rosuvastatin (CRESTOR) 10 MG tablet TAKE 1 TABLET EVERY DAY 90 tablet 3   No current facility-administered medications for this visit.     Musculoskeletal: Strength & Muscle Tone:  UTA Gait & Station: normal Patient leans: N/A  Psychiatric Specialty Exam: Review of Systems  Musculoskeletal:  Positive for arthralgias.       BL knee pain  Psychiatric/Behavioral:  Positive for decreased concentration and dysphoric mood. The patient is nervous/anxious.   All other systems reviewed and are negative.  There were no vitals taken for this visit.There is no height or weight on file to calculate BMI.  General Appearance: Casual  Eye Contact:  Good  Speech:  Clear and Coherent  Volume:  Normal  Mood:  Anxious and Depressed, irritable  Affect:  Congruent  Thought Process:  Goal Directed and Descriptions of Associations: Intact  Orientation:  Full (Time, Place, and Person)  Thought Content: Logical   Suicidal Thoughts:  No  Homicidal Thoughts:  No  Memory:  Immediate;   Fair Recent;    Fair Remote;   Limited  Judgement:  Fair  Insight:  Fair  Psychomotor Activity:  Normal  Concentration:  Concentration: Fair and Attention Span: Fair  Recall:  [employer] of Knowledge: Fair  Language: Fair  Akathisia:  No  Handed:  Left  AIMS (if indicated): done  Assets:  Communication Skills Desire for Improvement Housing Social Support  ADL's:  Intact  Cognition: WNL  Sleep:  Fair   Screenings: AIMS    Flowsheet Row Video Visit from 03/24/2022 in [place] [facility] Video Visit from 06/04/2021 in [facility] Visit from 01/28/2021 in [place] [facility] Visit from 10/24/2018 in [facility] Office Visit from 04/04/2018 in [facility] Total Score 0 0 0 5 0      GAD-7    Flowsheet Row Counselor from 03/03/2022 in [place] [facility] Video Visit from 01/13/2022 in [place] Video Visit from 06/25/2021 in [facility] Counselor from 03/10/2021 in [facility]  Total GAD-7 Score '15 7 17 12      '$ PHQ2-9    Flowsheet Row Video Visit from 03/24/2022 in [place] Counselor from 03/03/2022 in [facility] Video Visit from 01/13/2022 in [facility] [place] Video Visit from 12/24/2021 in [facility] Office Visit from 10/08/2021 in [place]  PHQ-2 Total Score '6 3 1 5 5  '$ PHQ-9 Total Score '14 13 6 13 9      '$ Flowsheet Row Video Visit from 03/24/2022 in [place] Video Visit from 12/24/2021 in [facility] Office Visit from 10/08/2021 in [place] No Risk Low Risk No  Risk        Assessment and Plan: Stacy Chambers is a 56 year old Caucasian female, widowed, lives in [place], has a history of bipolar disorder, anxiety disorder, COPD, RLS, vitamin B12 deficiency was evaluated by telemedicine today.  Patient is currently struggling with mood symptoms, will benefit from the following plan.  Plan Bipolar disorder mixed moderate-unstable Restart Geodon 20 mg p.o. daily with breakfast Wellbutrin XL 150 mg p.o. daily Depakote ER 1000 mg p.o. daily Depakote level-10/08/2021-73-therapeutic Trazodone 100-200 mg p.o. nightly for sleep  GAD-unstable Depakote ER 1000 mg p.o. daily Klonopin 0.5 mg up to 2-3 times a week only for severe panic symptoms Hydroxyzine 50 mg p.o. twice daily as needed Continue CBT with Mr. Maye Hides  Bereavement-improving Continue CBT  RLS-improving Will monitor closely  Weight gain likely multifactorial-improving Continue lifestyle modification.  Follow-up in clinic in 2 to 3 weeks or sooner if needed.   Collaboration of Care: Collaboration of Care: Encouraged to schedule appointment with therapist.  Patient/Guardian was advised Release of Information must be obtained prior to any record release in order to collaborate their care with an outside provider. Patient/Guardian was advised if they have not already done so to contact the registration department to sign all necessary forms in order for [name] to release information regarding their care.   Consent: Patient/Guardian gives verbal consent for treatment and assignment of benefits for services provided during this visit. Patient/Guardian expressed understanding and agreed to proceed.   This note was generated in part or whole with voice recognition software. Voice recognition is usually quite accurate but there are transcription  errors that can and very often do occur. I apologize for any typographical errors that were not detected and corrected.      Ursula Alert, MD 03/24/2022, 2:21 PM

## 2022-03-26 ENCOUNTER — Encounter: Payer: Self-pay | Admitting: Family Medicine

## 2022-03-26 ENCOUNTER — Ambulatory Visit (INDEPENDENT_AMBULATORY_CARE_PROVIDER_SITE_OTHER): Payer: Medicare HMO | Admitting: Family Medicine

## 2022-03-26 VITALS — BP 118/82 | HR 66 | Ht 64.0 in | Wt 179.8 lb

## 2022-03-26 DIAGNOSIS — M8580 Other specified disorders of bone density and structure, unspecified site: Secondary | ICD-10-CM

## 2022-03-26 DIAGNOSIS — M818 Other osteoporosis without current pathological fracture: Secondary | ICD-10-CM | POA: Diagnosis not present

## 2022-03-26 DIAGNOSIS — R7301 Impaired fasting glucose: Secondary | ICD-10-CM | POA: Diagnosis not present

## 2022-03-26 DIAGNOSIS — Z114 Encounter for screening for human immunodeficiency virus [HIV]: Secondary | ICD-10-CM

## 2022-03-26 DIAGNOSIS — Z0001 Encounter for general adult medical examination with abnormal findings: Secondary | ICD-10-CM

## 2022-03-26 DIAGNOSIS — E559 Vitamin D deficiency, unspecified: Secondary | ICD-10-CM | POA: Diagnosis not present

## 2022-03-26 DIAGNOSIS — Z13820 Encounter for screening for osteoporosis: Secondary | ICD-10-CM

## 2022-03-26 DIAGNOSIS — G8929 Other chronic pain: Secondary | ICD-10-CM

## 2022-03-26 DIAGNOSIS — Z1231 Encounter for screening mammogram for malignant neoplasm of breast: Secondary | ICD-10-CM

## 2022-03-26 DIAGNOSIS — Z1159 Encounter for screening for other viral diseases: Secondary | ICD-10-CM

## 2022-03-26 MED ORDER — ALENDRONATE SODIUM 70 MG PO TABS: 70.0000 mg | ORAL_TABLET | ORAL | 0 refills | Status: DC

## 2022-03-26 MED ORDER — MELOXICAM 15 MG PO TABS: 15.0000 mg | ORAL_TABLET | Freq: Every day | ORAL | 0 refills | Status: DC

## 2022-03-26 MED ORDER — METHOCARBAMOL 500 MG PO TABS: 500.0000 mg | ORAL_TABLET | Freq: Every day | ORAL | 1 refills | Status: DC

## 2022-03-26 NOTE — Assessment & Plan Note (Signed)
-   last Dexa scan was in 2020 -order is placed for another scan on 04/16/22 -encouraged to increase calcium and Vit D intake

## 2022-03-26 NOTE — Patient Instructions (Addendum)
I appreciate the opportunity to provide care to you today!    Follow up:  3 months  Labs: please stop by the lab during the week to get your blood drawn (CBC, CMP, TSH, Lipid profile, HgA1c, Vit D)  Screening: HIV and Hep C  Please stop by [facility] anytime to get your dexa and mammogram on 04/16/22   Please pick up your medications at the pharmacy   I recommend OTC Calcium 1,200 mg/day for postmenopausal women or women ? 57 years old and Vit D 600 units/day for ages 66-70 years  Referrals today-    Please continue to a heart-healthy diet and increase your physical activities. Try to exercise for 80mns at least three times a week.      It was a pleasure to see you and I look forward to continuing to work together on your health and well-being. Please do not hesitate to call the office if you need care or have questions about your care.   Have a wonderful day and week. With Gratitude, GAlvira MondayMSN, FNP-BC

## 2022-03-26 NOTE — Progress Notes (Signed)
New Patient Office Visit  Subjective:  Patient ID: Stacy Chambers, female    DOB: 06-30-1966  Age: 56 y.o. MRN: [id number]  CC:  Chief Complaint  Patient presents with   New Patient (Initial Visit)    Pt will be establishing care today. Complains of knee pain and back pain onset 03/12/2022. Would like to have DEXA scan and Mammogram.     HPI LAMIAH Chambers is a 56 y.o. female with past medical history of osteoporosis present for establishing care. Knee and back pain: The onset of symptoms was two weeks ago. Pain is rated 10/10. Dexa scan shows osteopenia on 11/03/2018 . She reports morning stiffness with pain. She notes no recent injury or trauma and reports taking meloxicam with minimum relief. She says the medication used to be effective but no longer relieves her symptoms.   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   Chronic obstructive pulmonary disease ([place]) 10/03/2015   COPD (chronic obstructive pulmonary disease) (HCC)    no inhalers   Elevated liver function tests 11/26/2004   H/O NEGATIVE LIVER BIOPSY, workup negative   Gallstones 02/21/2015   GERD (gastroesophageal reflux disease)    Hyperlipidemia    Insomnia    Lower back pain    Osteoarthritis    Ovarian cancer ([place]) 1995   RLS (restless legs syndrome)    Sedative, hypnotic or anxiolytic dependence with withdrawal, 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[place])    Wears dentures    full upper and lower    Past Surgical History:  Procedure Laterality Date   ABDOMINAL HYSTERECTOMY     APPENDECTOMY     BREAST BIOPSY Left 03/12/2015   fat necrosis   CESAREAN SECTION     COLONOSCOPY WITH PROPOFOL N/A 05/30/2018   Procedure: COLONOSCOPY WITH  PROPOFOL;  Surgeon: Manya Silvas, MD;  Location[facility] ENDOSCOPY;  Service: Endoscopy;  Laterality: N/A;   EXCISION MORTON'S NEUROMA Left 10/07/2016   Procedure: EXCISION MORTON'S NEUROMA;  Surgeon: [name], DPM;  Location: [place];  Service: Podiatry;  Laterality: Left;  IV WITH LOCAL   LIVER BIOPSY  2005   PerPt: Done to eval elevated LFTs and biopsy provided no definite dx/cause of elevated LFTs   MULTIPLE TOOTH EXTRACTIONS     OOPHORECTOMY     OSTECTOMY Right 04/10/2020   Procedure: DOUBLE OSTEOTOMY GENERAL W/ LOCAL;  Surgeon: [name], DPM;  Location: [place];  Service: Podiatry;  Laterality: Right;    Family History  Adopted: Yes  Problem Relation Age of Onset   COPD Mother    Depression Mother    Colon cancer Mother        unsure of age   51 Father        Lung CA, Skin Cancer--?type   COPD Father    Stroke Father    Cancer Sister 54       cervical cancer   COPD Brother    Alcohol abuse Brother    Stroke Brother    COPD Maternal Aunt    Depression Daughter    Schizophrenia Son    Breast cancer Neg Hx  Social History   Socioeconomic History   Marital status: Widowed    Spouse name: Not on file   Number of children: Not on file   Years of education: Not on file   Highest education level: Not on file  Occupational History   Not on file  Tobacco Use   Smoking status: Former    Packs/day: 1.00    Years: 30.00    Pack years: 30.00    Types: Cigarettes    Quit date: 04/08/2009    Years since quitting: 12.9   Smokeless tobacco: Never  Vaping Use   Vaping Use: Never used  Substance and Sexual Activity   Alcohol use: Not Currently    Alcohol/week: 3.0 - 7.0 standard drinks    Types: 3 - 7 Shots of liquor per week    Comment: occasionally   Drug use: Not Currently    Types: Marijuana    Comment: told to refrain until after surgery   Sexual activity: Yes    Birth control/protection: Surgical  Other Topics Concern    Not on file  Social History Narrative   Not on file   Social Determinants of Health   Financial Resource Strain: Not on file  Food Insecurity: Not on file  Transportation Needs: Not on file  Physical Activity: Not on file  Stress: Not on file  Social Connections: Not on file  Intimate Partner Violence: Not on file    ROS Review of Systems  Constitutional:  Negative for chills, fatigue and fever.  HENT:  Negative for congestion, sinus pressure, sinus pain, sneezing and sore throat.   Eyes:  Negative for pain, redness and itching.  Respiratory:  Negative for cough and chest tightness.   Cardiovascular:  Negative for chest pain and palpitations.  Gastrointestinal:  Negative for constipation, diarrhea, nausea and vomiting.  Endocrine: Negative for polydipsia, polyphagia and polyuria.  Genitourinary:  Negative for frequency and urgency.  Musculoskeletal:  Negative for arthralgias and back pain.  Skin:  Negative for rash and wound.  Neurological:  Negative for dizziness, tremors, light-headedness and headaches.  Psychiatric/Behavioral:  Negative for confusion, self-injury and sleep disturbance.    Objective:   Today's Vitals: BP 118/82   Pulse 66   Ht _0  (1.626 m)   Wt 179 lb 12.8 oz (81.6 kg)   SpO2 98%   BMI 30.86 kg/m   Physical Exam HENT:     Head: Normocephalic.     Right Ear: External ear normal.     Left Ear: External ear normal.     Nose: No congestion.     Mouth/Throat:     Mouth: Mucous membranes are moist.     Dentition: Has dentures.  Eyes:     Extraocular Movements: Extraocular movements intact.     Pupils: Pupils are equal, round, and reactive to light.  Cardiovascular:     Rate and Rhythm: Normal rate and regular rhythm.     Pulses: Normal pulses.     Heart sounds: Normal heart sounds.  Pulmonary:     Effort: Pulmonary effort is normal.     Breath sounds: Normal breath sounds.  Abdominal:     Palpations: Abdomen is soft.  Musculoskeletal:      Cervical back: No rigidity.     Right lower leg: No edema.     Left lower leg: No edema.  Skin:    General: Skin is warm.     Findings: No lesion.  Neurological:     Mental Status:  She is alert and oriented to person, place, and time.  Psychiatric:     Comments: Normal affect    Assessment & Plan:   Problem List Items Addressed This Visit       Musculoskeletal and Integument   Osteopenia    - last Dexa scan was in 2020 -order is placed for another scan on 04/16/22 -encouraged to increase calcium and Vit D intake         Other   Chronic pain    knee and back pain likely for arthritis will increase the dose of meloxicam will refill Robaxin       Relevant Medications   meloxicam (MOBIC) 15 MG tablet   methocarbamol (ROBAXIN) 500 MG tablet   Other Visit Diagnoses     Screening for osteoporosis    -  Primary   Relevant Orders   HM DEXA SCAN   Breast cancer screening by mammogram       Relevant Orders   MM 3D SCREEN BREAST BILATERAL   Need for hepatitis C screening test       Relevant Orders   Hepatitis C Antibody   Encounter for screening for HIV       Relevant Orders   HIV antibody (with reflex)   Vitamin D deficiency       Relevant Orders   Vitamin D (25 hydroxy)   IFG (impaired fasting glucose)       Relevant Orders   Hemoglobin A1c   Encounter for general adult medical examination with abnormal findings       Relevant Orders   CBC with Differential/Platelet   CMP14+EGFR   TSH + free T4   Lipid panel   Other osteoporosis, unspecified pathological fracture presence           Outpatient Encounter Medications as of 03/26/2022  Medication Sig   ammonium lactate (AMLACTIN) 12 % cream APPLY TOPICALLY AS NEEDED FOR DRY SKIN   budesonide-formoterol (SYMBICORT) 160-4.5 MCG/ACT inhaler Inhale 2 puffs into the lungs 2 (two) times daily.   buPROPion (WELLBUTRIN XL) 150 MG 24 hr tablet Take 1 tablet (150 mg total) by mouth daily.   clonazePAM (KLONOPIN) 0.5  MG tablet TAKE 1 TABLET 2 TO 3 TIMES A WEEK AS DIRECTED FOR SEVERE PANIC ATTACKS ONLY   divalproex (DEPAKOTE ER) 500 MG 24 hr tablet TAKE 2 TABLETS  DAILY.   esomeprazole (NEXIUM) 20 MG capsule Take 1 capsule (20 mg total) by mouth daily as needed (take on empty stomach).   fluticasone (FLONASE) 50 MCG/ACT nasal spray Place 2 sprays into both nostrils daily.   hydrochlorothiazide (HYDRODIURIL) 12.5 MG tablet    hydrOXYzine (ATARAX) 50 MG tablet TAKE 1 TABLET TWICE DAILY AS NEEDED FOR SEVERE ANXIETY ATTACKS   meloxicam (MOBIC) 15 MG tablet Take 1 tablet (15 mg total) by mouth daily.   rosuvastatin (CRESTOR) 10 MG tablet TAKE 1 TABLET EVERY DAY   ziprasidone (GEODON) 20 MG capsule Take 1 capsule (20 mg total) by mouth daily with breakfast.   [DISCONTINUED] alendronate (FOSAMAX) 70 MG tablet Take 1 tablet (70 mg total) by mouth once a week. Take with a full glass of water on an empty stomach.   [DISCONTINUED] meloxicam (MOBIC) 7.5 MG tablet TAKE 1 TABLET TWICE DAILY AS NEEDED FOR PAIN   [DISCONTINUED] methocarbamol (ROBAXIN) 500 MG tablet    FLULAVAL QUADRIVALENT 0.5 ML injection  (Patient not taking: Reported on 03/26/2022)   methocarbamol (ROBAXIN) 500 MG tablet Take 1 tablet (500 mg total) by  mouth daily.   No facility-administered encounter medications on file as of 03/26/2022.    Follow-up: No follow-ups on file.   Alvira Monday, FNP

## 2022-03-26 NOTE — Assessment & Plan Note (Addendum)
knee and back pain likely for arthritis will increase the dose of meloxicam will refill Robaxin

## 2022-03-27 ENCOUNTER — Other Ambulatory Visit: Payer: Self-pay | Admitting: Family Medicine

## 2022-03-27 ENCOUNTER — Telehealth: Payer: Self-pay

## 2022-03-27 DIAGNOSIS — E538 Deficiency of other specified B group vitamins: Secondary | ICD-10-CM

## 2022-03-27 NOTE — Telephone Encounter (Signed)
Pt has been informed.

## 2022-03-27 NOTE — Progress Notes (Unsigned)
12  

## 2022-03-27 NOTE — Telephone Encounter (Signed)
Patient called asking can she be tested for her B12 , return call back # [phone].

## 2022-03-28 ENCOUNTER — Other Ambulatory Visit: Payer: Self-pay | Admitting: Psychiatry

## 2022-03-28 DIAGNOSIS — F3132 Bipolar disorder, current episode depressed, moderate: Secondary | ICD-10-CM

## 2022-03-30 DIAGNOSIS — Z0001 Encounter for general adult medical examination with abnormal findings: Secondary | ICD-10-CM | POA: Diagnosis not present

## 2022-03-30 DIAGNOSIS — Z114 Encounter for screening for human immunodeficiency virus [HIV]: Secondary | ICD-10-CM | POA: Diagnosis not present

## 2022-03-30 DIAGNOSIS — Z1159 Encounter for screening for other viral diseases: Secondary | ICD-10-CM | POA: Diagnosis not present

## 2022-03-30 DIAGNOSIS — C801 Malignant (primary) neoplasm, unspecified: Secondary | ICD-10-CM | POA: Diagnosis not present

## 2022-03-30 DIAGNOSIS — E559 Vitamin D deficiency, unspecified: Secondary | ICD-10-CM | POA: Diagnosis not present

## 2022-03-30 DIAGNOSIS — R7989 Other specified abnormal findings of blood chemistry: Secondary | ICD-10-CM | POA: Diagnosis not present

## 2022-03-30 DIAGNOSIS — R7301 Impaired fasting glucose: Secondary | ICD-10-CM | POA: Diagnosis not present

## 2022-03-30 DIAGNOSIS — E785 Hyperlipidemia, unspecified: Secondary | ICD-10-CM | POA: Diagnosis not present

## 2022-03-30 DIAGNOSIS — G2581 Restless legs syndrome: Secondary | ICD-10-CM | POA: Diagnosis not present

## 2022-03-31 ENCOUNTER — Ambulatory Visit (INDEPENDENT_AMBULATORY_CARE_PROVIDER_SITE_OTHER): Payer: Medicare HMO | Admitting: Clinical

## 2022-03-31 DIAGNOSIS — F411 Generalized anxiety disorder: Secondary | ICD-10-CM | POA: Diagnosis not present

## 2022-03-31 DIAGNOSIS — F3162 Bipolar disorder, current episode mixed, moderate: Secondary | ICD-10-CM | POA: Diagnosis not present

## 2022-03-31 DIAGNOSIS — F5105 Insomnia due to other mental disorder: Secondary | ICD-10-CM

## 2022-03-31 DIAGNOSIS — R41840 Attention and concentration deficit: Secondary | ICD-10-CM | POA: Diagnosis not present

## 2022-03-31 LAB — CBC WITH DIFFERENTIAL/PLATELET
Basophils Absolute: 0 10*3/uL (ref 0.0–0.2)
Basos: 1 %
EOS (ABSOLUTE): 0.1 10*3/uL (ref 0.0–0.4)
Eos: 2 %
Hematocrit: 36.9 % (ref 34.0–46.6)
Hemoglobin: 12.3 g/dL (ref 11.1–15.9)
Immature Grans (Abs): 0 10*3/uL (ref 0.0–0.1)
Immature Granulocytes: 0 %
Lymphocytes Absolute: 1.8 10*3/uL (ref 0.7–3.1)
Lymphs: 32 %
MCH: 30.2 pg (ref 26.6–33.0)
MCHC: 33.3 g/dL (ref 31.5–35.7)
MCV: 91 fL (ref 79–97)
Monocytes Absolute: 0.3 10*3/uL (ref 0.1–0.9)
Monocytes: 5 %
Neutrophils Absolute: 3.5 10*3/uL (ref 1.4–7.0)
Neutrophils: 60 %
Platelets: 158 10*3/uL (ref 150–450)
RBC: 4.07 x10E6/uL (ref 3.77–5.28)
RDW: 12.7 % (ref 11.7–15.4)
WBC: 5.7 10*3/uL (ref 3.4–10.8)

## 2022-03-31 LAB — CMP14+EGFR
ALT: 13 IU/L (ref 0–32)
AST: 13 IU/L (ref 0–40)
Albumin/Globulin Ratio: 2 (ref 1.2–2.2)
Albumin: 4.5 g/dL (ref 3.8–4.9)
Alkaline Phosphatase: 73 IU/L (ref 44–121)
BUN/Creatinine Ratio: 19 (ref 9–23)
BUN: 13 mg/dL (ref 6–24)
Bilirubin Total: 0.3 mg/dL (ref 0.0–1.2)
CO2: 25 mmol/L (ref 20–29)
Calcium: 9.6 mg/dL (ref 8.7–10.2)
Chloride: 104 mmol/L (ref 96–106)
Creatinine, Ser: 0.68 mg/dL (ref 0.57–1.00)
Globulin, Total: 2.2 g/dL (ref 1.5–4.5)
Glucose: 90 mg/dL (ref 70–99)
Potassium: 4.5 mmol/L (ref 3.5–5.2)
Sodium: 143 mmol/L (ref 134–144)
Total Protein: 6.7 g/dL (ref 6.0–8.5)
eGFR: 102 mL/min/{1.73_m2} (ref >59)

## 2022-03-31 LAB — LIPID PANEL
Chol/HDL Ratio: 2.9 ratio (ref 0.0–4.4)
Cholesterol, Total: 121 mg/dL (ref 100–199)
HDL: 42 mg/dL (ref >39)
LDL Chol Calc (NIH): 56 mg/dL (ref 0–99)
Triglycerides: 131 mg/dL (ref 0–149)
VLDL Cholesterol Cal: 23 mg/dL (ref 5–40)

## 2022-03-31 LAB — B12 AND FOLATE PANEL
Folate: 13 ng/mL (ref >3.0)
Vitamin B-12: 812 pg/mL (ref 232–1245)

## 2022-03-31 LAB — VITAMIN D 25 HYDROXY (VIT D DEFICIENCY, FRACTURES): Vit D, 25-Hydroxy: 37.4 ng/mL (ref 30.0–100.0)

## 2022-03-31 LAB — TSH+FREE T4
Free T4: 1.07 ng/dL (ref 0.82–1.77)
TSH: 2.6 u[IU]/mL (ref 0.450–4.500)

## 2022-03-31 LAB — HEMOGLOBIN A1C
Est. average glucose Bld gHb Est-mCnc: 111 mg/dL
Hgb A1c MFr Bld: 5.5 % (ref 4.8–5.6)

## 2022-03-31 LAB — HIV ANTIBODY (ROUTINE TESTING W REFLEX): HIV Screen 4th Generation wRfx: NONREACTIVE

## 2022-03-31 LAB — HEPATITIS C ANTIBODY: Hep C Virus Ab: NONREACTIVE

## 2022-03-31 NOTE — Progress Notes (Signed)
Virtual Visit via Video Note   I connected with Stacy Chambers on 03/31/2022 at  11:00 AM EST by a video enabled telemedicine application and verified that I am speaking with the correct person using two identifiers.   Location: Patient: Home Provider: Office   I discussed the limitations of evaluation and management by telemedicine and the availability of in person appointments. The patient expressed understanding and agreed to proceed.   THERAPIST PROGRESS NOTE   Session Time: 11:00 AM-11:30 AM   Participation Level: Active   Behavioral Response: CasualAlertDepressed   Type of Therapy: Individual Therapy   Treatment Goals addressed: Coping   Interventions: CBT   Summary: Stacy Chambers is a 56 y.o. female who presents with Bipolar Disorder./ GAD/Insomnia. The OPT therapist worked with the patient for her ongoing OPT treatment session. The OPT therapist utilized Motivational Interviewing to assist in creating therapeutic repore. The patient in the session was engaged and work in collaboration giving feedback about her triggers and symptoms over the past few weeks.The patient spoke about the ongoing impact of the patients brother and his girlfriend living in the home. And the impact of the finances.The OPT therapist utilized Cognitive Behavioral Therapy through cognitive restructuring as well as worked with the patient on coping strategies to assist in management of mood. The OPT therapist worked with the patient on continuing to maintain her basic needs of self care with eating, hygiene, and sleep. The OPT therapist reviewed with the patient most recent meeting with psychiatrist Dr. Shea Evans and gained the patients feedback about how things have been going in her Medication Therapy program.   Suicidal/Homicidal: Nowithout intent/plan   Therapist Response: The OPT therapist worked with the patient for the patients scheduled session. The patient was engaged in her session and gave feedback  in relation to triggers, symptoms, and behavior responses over the past few weeks. The OPT therapist worked with the patient utilizing an in session Cognitive Behavioral Therapy exercise. The patient was responsive in the session and verbalized," I think a lot about my husband this month a lot our anniversary is on the 20th".  The OPT therapist worked with the patient  on putting extra supports and coping skills in place to help manage this identified trigger.The OPT therapist will continue treatment work with the patient in her next scheduled session    Plan: Return again in 3 weeks.   Diagnosis:      Axis I: Bipolar 1 disorder,depressed mild/GAD/Insomnia/ Attention deficit.                             Axis II: No diagnosis     Collaboration of Care: collaboration of care reviewed with medication therapy involvement with psychiatrist Dr. Shea Evans.   Patient/Guardian was advised Release of Information must be obtained prior to any record release in order to collaborate their care with an outside provider. Patient/Guardian was advised if they have not already done so to contact the registration department to sign all necessary forms in order for [name] to release information regarding their care.    Consent: Patient/Guardian gives verbal consent for treatment and assignment of benefits for services provided during this visit. Patient/Guardian expressed understanding and agreed to proceed.        I discussed the assessment and treatment plan with the patient. The patient was provided an opportunity to ask questions and all were answered. The patient agreed with the plan and demonstrated an understanding  of the instructions.   The patient was advised to call back or seek an in-person evaluation if the symptoms worsen or if the condition fails to improve as anticipated.   I provided 30 minutes of non-face-to-face time during this encounter.   [name], LCSW   03/31/2022

## 2022-04-01 ENCOUNTER — Telehealth: Payer: Self-pay | Admitting: Family Medicine

## 2022-04-01 ENCOUNTER — Other Ambulatory Visit: Payer: Self-pay

## 2022-04-01 DIAGNOSIS — G8929 Other chronic pain: Secondary | ICD-10-CM

## 2022-04-01 MED ORDER — METHOCARBAMOL 500 MG PO TABS: 500.0000 mg | ORAL_TABLET | Freq: Every day | ORAL | 1 refills | Status: DC

## 2022-04-01 NOTE — Telephone Encounter (Signed)
Pt called stating that she was told she takes 2 pain meds. She is only aware of 1. Wants clarification???

## 2022-04-01 NOTE — Telephone Encounter (Signed)
Got in touch with patient, states she did not receive a refill for robaxin only the meloxicam, went ahead and resent the rx to [facility].

## 2022-04-01 NOTE — Telephone Encounter (Signed)
Returned call left vm.  

## 2022-04-01 NOTE — Telephone Encounter (Signed)
Pt called back for medication clarification

## 2022-04-07 NOTE — Progress Notes (Signed)
Please inform the patient that her las are within normal limits

## 2022-04-16 ENCOUNTER — Telehealth: Payer: Self-pay | Admitting: Family Medicine

## 2022-04-16 ENCOUNTER — Other Ambulatory Visit: Payer: Self-pay

## 2022-04-16 DIAGNOSIS — Z13820 Encounter for screening for osteoporosis: Secondary | ICD-10-CM

## 2022-04-16 NOTE — Progress Notes (Signed)
Orders put in for dexa scan

## 2022-04-16 NOTE — Telephone Encounter (Signed)
Patient needs orders put In for dexa scan

## 2022-04-16 NOTE — Telephone Encounter (Signed)
Orders have been put in.

## 2022-04-17 ENCOUNTER — Other Ambulatory Visit: Payer: Self-pay | Admitting: Family Medicine

## 2022-04-17 ENCOUNTER — Ambulatory Visit (HOSPITAL_COMMUNITY)
Admission: RE | Admit: 2022-04-17 | Discharge: 2022-04-17 | Disposition: A | Discharge status: Home / Self Care | Payer: Medicare HMO | Source: Ambulatory Visit | Attending: Family Medicine | Admitting: Family Medicine

## 2022-04-17 DIAGNOSIS — Z13820 Encounter for screening for osteoporosis: Secondary | ICD-10-CM | POA: Diagnosis not present

## 2022-04-17 DIAGNOSIS — M8589 Other specified disorders of bone density and structure, multiple sites: Secondary | ICD-10-CM | POA: Insufficient documentation

## 2022-04-17 DIAGNOSIS — G8929 Other chronic pain: Secondary | ICD-10-CM

## 2022-04-17 DIAGNOSIS — Z780 Asymptomatic menopausal state: Secondary | ICD-10-CM | POA: Insufficient documentation

## 2022-04-17 DIAGNOSIS — Z1231 Encounter for screening mammogram for malignant neoplasm of breast: Secondary | ICD-10-CM | POA: Diagnosis not present

## 2022-04-20 ENCOUNTER — Ambulatory Visit (INDEPENDENT_AMBULATORY_CARE_PROVIDER_SITE_OTHER): Payer: Medicare HMO | Admitting: Family Medicine

## 2022-04-20 ENCOUNTER — Telehealth: Payer: Self-pay | Admitting: Family Medicine

## 2022-04-20 ENCOUNTER — Ambulatory Visit (INDEPENDENT_AMBULATORY_CARE_PROVIDER_SITE_OTHER): Payer: Medicare HMO

## 2022-04-20 ENCOUNTER — Encounter: Payer: Self-pay | Admitting: Family Medicine

## 2022-04-20 DIAGNOSIS — Z0000 Encounter for general adult medical examination without abnormal findings: Secondary | ICD-10-CM

## 2022-04-20 DIAGNOSIS — M8580 Other specified disorders of bone density and structure, unspecified site: Secondary | ICD-10-CM | POA: Diagnosis not present

## 2022-04-20 NOTE — Progress Notes (Addendum)
Subjective:   Stacy Chambers is a 56 y.o. female who presents for Medicare Annual (Subsequent) preventive examination. I connected with  [name] on 04/20/22 by a audio enabled telemedicine application and verified that I am speaking with the correct person using two identifiers.  Patient Location: Home  Provider Location: Office/Clinic  I discussed the limitations of evaluation and management by telemedicine. The patient expressed understanding and agreed to proceed.  Review of Systems           Objective:    There were no vitals filed for this visit. There is no height or weight on file to calculate BMI.     04/10/2020   12:32 PM 12/19/2019    8:13 AM 11/21/2018    8:10 AM 10/24/2018   10:06 AM 10/12/2018    8:17 AM 09/14/2018    8:54 AM 09/14/2018    8:46 AM  Advanced Directives  Does Patient Have a Medical Advance Directive? No No    No No  Would patient like information on creating a medical advance directive? No - Patient declined Yes (MAU/Ambulatory/Procedural Areas - Information given)    Yes (Inpatient - patient defers creating a medical advance directive at this time) Yes (Inpatient - patient requests chaplain consult to create a medical advance directive)     Information is confidential and restricted. Go to Review Flowsheets to unlock data.    Current Medications (verified) Outpatient Encounter Medications as of 04/20/2022  Medication Sig   ammonium lactate (AMLACTIN) 12 % cream APPLY TOPICALLY AS NEEDED FOR DRY SKIN   budesonide-formoterol (SYMBICORT) 160-4.5 MCG/ACT inhaler Inhale 2 puffs into the lungs 2 (two) times daily.   buPROPion (WELLBUTRIN XL) 150 MG 24 hr tablet Take 1 tablet (150 mg total) by mouth daily.   clonazePAM (KLONOPIN) 0.5 MG tablet TAKE 1 TABLET 2 TO 3 TIMES A WEEK AS DIRECTED FOR SEVERE PANIC ATTACKS ONLY   divalproex (DEPAKOTE ER) 500 MG 24 hr tablet TAKE 2 TABLETS  DAILY.   esomeprazole (NEXIUM) 20 MG capsule Take 1 capsule (20  mg total) by mouth daily as needed (take on empty stomach).   FLULAVAL QUADRIVALENT 0.5 ML injection  (Patient not taking: Reported on 03/26/2022)   fluticasone (FLONASE) 50 MCG/ACT nasal spray Place 2 sprays into both nostrils daily.   hydrochlorothiazide (HYDRODIURIL) 12.5 MG tablet    hydrOXYzine (ATARAX) 50 MG tablet TAKE 1 TABLET TWICE DAILY AS NEEDED FOR SEVERE ANXIETY ATTACKS   meloxicam (MOBIC) 15 MG tablet Take 1 tablet (15 mg total) by mouth daily.   methocarbamol (ROBAXIN) 500 MG tablet Take 1 tablet (500 mg total) by mouth daily.   rosuvastatin (CRESTOR) 10 MG tablet TAKE 1 TABLET EVERY DAY   traZODone (DESYREL) 100 MG tablet TAKE 2 TABLETS AT BEDTIME AS NEEDED FOR SLEEP   ziprasidone (GEODON) 20 MG capsule Take 1 capsule (20 mg total) by mouth daily with breakfast.   No facility-administered encounter medications on file as of 04/20/2022.    Allergies (verified) Benadryl [diphenhydramine hcl], Codeine, and Vicodin [hydrocodone-acetaminophen]   History: Past Medical History:  Diagnosis Date   Abnormal blood chemistry 10/03/2015   Anginal pain (HCC)    Anxiety    Anxiety, generalized 02/21/2015   Bipolar affective disorder ([place]) 03/08/2013   Overview:  Last Assessment & Plan:  Continue meds, f/u with psych Restart benzo at 1 po BID prn,     Bipolar disorder ([place])    Cancer ([place]) 1995   uterine   Chronic  obstructive pulmonary disease ([place]) 10/03/2015   COPD (chronic obstructive pulmonary disease) (HCC)    no inhalers   Elevated liver function tests 11/26/2004   H/O NEGATIVE LIVER BIOPSY, workup negative   Gallstones 02/21/2015   GERD (gastroesophageal reflux disease)    Hyperlipidemia    Insomnia    Lower back pain    Osteoarthritis    Ovarian cancer ([place]) 1995   RLS (restless legs syndrome)    Sedative, hypnotic or anxiolytic dependence with withdrawal, 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[place])    Wears dentures    full upper and lower   Past Surgical History:  Procedure Laterality Date    ABDOMINAL HYSTERECTOMY     APPENDECTOMY     BREAST BIOPSY Left 03/12/2015   fat necrosis   CESAREAN SECTION     COLONOSCOPY WITH PROPOFOL N/A 05/30/2018   Procedure: COLONOSCOPY WITH PROPOFOL;  Surgeon: Manya Silvas, MD;  Location[facility] ENDOSCOPY;  Service: Endoscopy;  Laterality: N/A;   EXCISION MORTON'S NEUROMA Left 10/07/2016   Procedure: EXCISION MORTON'S NEUROMA;  Surgeon: [name], DPM;  Location: [place];  Service: Podiatry;  Laterality: Left;  IV WITH LOCAL   LIVER BIOPSY  2005   PerPt: Done to eval elevated LFTs and biopsy provided no definite dx/cause of elevated LFTs   MULTIPLE TOOTH EXTRACTIONS     OOPHORECTOMY     OSTECTOMY Right 04/10/2020   Procedure: DOUBLE OSTEOTOMY GENERAL W/ LOCAL;  Surgeon: [name], DPM;  Location: [place];  Service: Podiatry;  Laterality: Right;   Family History  Adopted: Yes  Problem Relation Age of Onset   COPD Mother    Depression Mother    Colon cancer Mother        unsure of age   89 Father        Lung CA, Skin Cancer--?type   COPD Father    Stroke Father    Cancer Sister 103       cervical cancer   COPD Brother    Alcohol abuse Brother    Stroke Brother    COPD Maternal Aunt    Depression Daughter    Schizophrenia Son    Breast cancer Neg Hx    Social History   Socioeconomic History   Marital status: Widowed    Spouse name: Not on file   Number of children: Not on file   Years of education: Not on file   Highest education level: Not on file  Occupational History   Not on file  Tobacco Use   Smoking status: Former    Packs/day: 1.00    Years: 30.00    Total pack years: 30.00    Types: Cigarettes    Quit date: 04/08/2009    Years since quitting: 13.0   Smokeless tobacco: Never  Vaping Use   Vaping Use: Never used  Substance and Sexual Activity   Alcohol use: Not Currently    Alcohol/week: 3.0 - 7.0 standard drinks of alcohol    Types: 3 - 7 Shots of liquor per week     Comment: occasionally   Drug use: Not Currently    Types: Marijuana    Comment: told to refrain until after surgery   Sexual activity: Yes    Birth control/protection: Surgical  Other Topics Concern   Not on file  Social History Narrative   Not on file   Social Determinants of Health   Financial Resource Strain: Low Risk  (03/13/2021)   Overall Financial Resource Strain (CARDIA)  Difficulty of Paying Living Expenses: Not very hard  Food Insecurity: Not on file  Transportation Needs: Not on file  Physical Activity: Not on file  Stress: Not on file  Social Connections: Not on file    Tobacco Counseling Counseling given: Not Answered   Clinical Intake:                 Diabetic?no         Activities of Daily Living     No data to display          Patient Care Team: Alvira Monday, Pierz as PCP - General (Family Medicine) Edythe Clarity, [facility] as Pharmacist (Pharmacist)  Indicate any recent Medical Services you may have received from other than [facility] providers in the past year (date may be approximate).     Assessment:   This is a routine wellness examination for Stacy Chambers.  Hearing/Vision screen No results found.  Dietary issues and exercise activities discussed:     Goals Addressed   None    Depression Screen    03/26/2022    9:23 AM 03/24/2022   10:17 AM 03/03/2022   10:19 AM 01/13/2022    4:38 PM 12/24/2021    9:33 AM 10/08/2021    9:31 AM 08/18/2021    3:18 PM  PHQ 2/9 Scores  PHQ - 2 Score 6        PHQ- 9 Score 21           Information is confidential and restricted. Go to Review Flowsheets to unlock data.    Fall Risk    03/26/2022    9:22 AM 02/07/2020    9:10 AM 12/19/2019    8:11 AM 10/21/2018   11:12 AM 09/14/2018    8:55 AM  Fall Risk   Falls in the past year? 0 0 0  0  Number falls in past yr: 0    0  Injury with Fall? 0    0  Risk for fall due to : No Fall Risks No Fall Risks No Fall Risks  Impaired mobility;Impaired  balance/gait  Follow up Falls evaluation completed Falls evaluation completed Falls evaluation completed Falls evaluation completed Falls prevention discussed;Education provided;Falls evaluation completed    FALL RISK PREVENTION PERTAINING TO THE HOME:  Any stairs in or around the home? Yes  If so, are there any without handrails? Yes  Home free of loose throw rugs in walkways, pet beds, electrical cords, etc? Yes  Adequate lighting in your home to reduce risk of falls? Yes   ASSISTIVE DEVICES UTILIZED TO PREVENT FALLS:  Life alert? No  Use of a cane, walker or w/c? No  Grab bars in the bathroom? No  Shower chair or bench in shower? No  Elevated toilet seat or a handicapped toilet? No   TIMED UP AND GO:  Was the test performed? No .  Length of time to ambulate 10 feet:  sec.     Cognitive Function:        01/31/2019    9:37 AM  6CIT Screen  What Year? 0 points  What month? 0 points  What time? 0 points  Count back from 20 2 points  Months in reverse 2 points  Repeat phrase 0 points  Total Score 4 points    Immunizations Immunization History  Administered Date(s) Administered   Influenza,inj,Quad PF,6+ Mos 06/26/2014, 06/14/2019, 09/16/2020   Influenza-Unspecified 09/14/2018   Tdap 11/22/2013    TDAP status: Up to date  Flu Vaccine  status: Up to date  Pneumococcal vaccine status: Up to date  Covid-19 vaccine status: Information provided on how to obtain vaccines.   Qualifies for Shingles Vaccine? Yes   Zostavax completed No   Shingrix Completed?: No.    Education has been provided regarding the importance of this vaccine. Patient has been advised to call insurance company to determine out of pocket expense if they have not yet received this vaccine. Advised may also receive vaccine at local pharmacy or Health Dept. Verbalized acceptance and understanding.  Screening Tests Health Maintenance  Topic Date Due   COVID-19 Vaccine (1) Never done   Zoster  Vaccines- Shingrix (1 of 2) Never done   INFLUENZA VACCINE  05/26/2022   MAMMOGRAM  04/18/2023   COLONOSCOPY (Pts 45-62yr Insurance coverage will need to be confirmed)  05/31/2023   TETANUS/TDAP  11/23/2023   Hepatitis C Screening  Completed   HIV Screening  Completed   HPV VACCINES  Aged Out    Health Maintenance  Health Maintenance Due  Topic Date Due   COVID-19 Vaccine (1) Never done   Zoster Vaccines- Shingrix (1 of 2) Never done    Colorectal cancer screening: Type of screening: Colonoscopy. Completed 05/30/2018. Repeat every 5 years  Mammogram status: Completed 04/17/2022. Repeat every year  Bone Density status: Completed 04/17/22. Results reflect: Bone density results: OSTEOPENIA. Repeat every 1 years.  Lung Cancer Screening: (Low Dose CT Chest recommended if Age 440-80years, 30 pack-year currently smoking OR have quit w/in 15years.) does not qualify.   Lung Cancer Screening Referral:   Additional Screening:  Hepatitis C Screening: does not qualify; Completed 03/30/2022  Vision Screening: Recommended annual ophthalmology exams for early detection of glaucoma and other disorders of the eye. Is the patient up to date with their annual eye exam?  Yes  Who is the provider or what is the name of the office in which the patient attends annual eye exams? My eye doctor If pt is not established with a provider, would they like to be referred to a provider to establish care? No .   Dental Screening: Recommended annual dental exams for proper oral hygiene  Community Resource Referral / Chronic Care Management: CRR required this visit?  No   CCM required this visit?  No      Plan:     I have personally reviewed and noted the following in the patient's chart:   Medical and social history Use of alcohol, tobacco or illicit drugs  Current medications and supplements including opioid prescriptions.  Functional ability and status Nutritional status Physical activity Advanced  directives List of other physicians Hospitalizations, surgeries, and ER visits in previous 12 months Vitals Screenings to include cognitive, depression, and falls Referrals and appointments  In addition, I have reviewed and discussed with patient certain preventive protocols, quality metrics, and best practice recommendations. A written personalized care plan for preventive services as well as general preventive health recommendations were provided to patient.     JJill Side [place]  04/20/2022   Nurse Notes:

## 2022-04-21 ENCOUNTER — Ambulatory Visit (INDEPENDENT_AMBULATORY_CARE_PROVIDER_SITE_OTHER): Payer: Medicare HMO | Admitting: Clinical

## 2022-04-21 DIAGNOSIS — F3162 Bipolar disorder, current episode mixed, moderate: Secondary | ICD-10-CM

## 2022-04-21 DIAGNOSIS — F5105 Insomnia due to other mental disorder: Secondary | ICD-10-CM | POA: Diagnosis not present

## 2022-04-21 DIAGNOSIS — R41840 Attention and concentration deficit: Secondary | ICD-10-CM

## 2022-04-21 DIAGNOSIS — F411 Generalized anxiety disorder: Secondary | ICD-10-CM | POA: Diagnosis not present

## 2022-04-23 ENCOUNTER — Telehealth: Payer: Self-pay | Admitting: Psychiatry

## 2022-04-23 ENCOUNTER — Encounter: Payer: Self-pay | Admitting: Psychiatry

## 2022-04-23 ENCOUNTER — Telehealth (HOSPITAL_COMMUNITY): Payer: Self-pay | Admitting: Psychiatry

## 2022-04-23 ENCOUNTER — Telehealth (INDEPENDENT_AMBULATORY_CARE_PROVIDER_SITE_OTHER): Payer: Medicare HMO | Admitting: Psychiatry

## 2022-04-23 DIAGNOSIS — G2581 Restless legs syndrome: Secondary | ICD-10-CM | POA: Diagnosis not present

## 2022-04-23 DIAGNOSIS — F411 Generalized anxiety disorder: Secondary | ICD-10-CM | POA: Diagnosis not present

## 2022-04-23 DIAGNOSIS — Z79899 Other long term (current) drug therapy: Secondary | ICD-10-CM

## 2022-04-23 DIAGNOSIS — F3162 Bipolar disorder, current episode mixed, moderate: Secondary | ICD-10-CM

## 2022-04-23 DIAGNOSIS — G3184 Mild cognitive impairment, so stated: Secondary | ICD-10-CM | POA: Diagnosis not present

## 2022-04-23 DIAGNOSIS — F5105 Insomnia due to other mental disorder: Secondary | ICD-10-CM

## 2022-04-23 DIAGNOSIS — Z634 Disappearance and death of family member: Secondary | ICD-10-CM | POA: Diagnosis not present

## 2022-04-23 MED ORDER — DIVALPROEX SODIUM ER 250 MG PO TB24: 250.0000 mg | ORAL_TABLET | Freq: Every day | ORAL | 0 refills | Status: DC

## 2022-04-23 NOTE — Telephone Encounter (Signed)
D:  Dr. Shea Evans referred pt to [facility] [place].  A:  Placed call and oriented pt.  Pt states she would like to contact her insurance first to find out about her copay.  Scheduled pt to start [place] on 04-29-22 @ 9 a.m..  Inform Dr. Shea Evans. R:  Pt receptive.

## 2022-04-23 NOTE — Telephone Encounter (Signed)
Pt called stating she has an appointment for intensive therapy starting next week. States that her insurance is requiring pre authorization

## 2022-04-23 NOTE — Telephone Encounter (Signed)
I spoke to [name] regarding this. They usually take care of prior authorization . Patient aware of this per [name].

## 2022-04-23 NOTE — Progress Notes (Signed)
Virtual Visit via Video Note  I connected with Stacy Chambers on 04/23/22 at  8:30 AM EDT by a video enabled telemedicine application and verified that I am speaking with the correct person using two identifiers.  Location Provider Location : [facility] Patient Location : Home  Participants: Patient , Provider   I discussed the limitations of evaluation and management by telemedicine and the availability of in person appointments. The patient expressed understanding and agreed to proceed.   I discussed the assessment and treatment plan with the patient. The patient was provided an opportunity to ask questions and all were answered. The patient agreed with the plan and demonstrated an understanding of the instructions.   The patient was advised to call back or seek an in-person evaluation if the symptoms worsen or if the condition fails to improve as anticipated.                                                                     [place] MD OP Progress Note  04/23/2022 12:08 PM [name]  MRN:  [id number]  Chief Complaint:  Chief Complaint  Patient presents with   Follow-up: 56 year old Caucasian female with history of bipolar disorder, GAD, insomnia, grief presented for medication management.   HPI: Stacy Chambers is a 56 year old Caucasian female, lives in [place], has a history of bipolar disorder, GAD, insomnia, bereavement, RLS, MCI, weight gain, memory loss, vitamin B12 deficiency was evaluated by telemedicine today.  Patient today reports she continues to grieve the loss of several family members who passed away in the past several months.  Patient reports it was her wedding anniversary on June 20 th.  Patient reports she has had a hard time the past few weeks with grief.  She also continues to grieve the loss of her son who passed away in [place] in 12-Jan-2008 as well as her sister or sister-in-law and her niece who passed away in the past several months.  Patient however reports she  has been talking to her therapist.  She also has support from her brother and his girlfriend who stays with her.  Reports her brother's girlfriend recently gave that her 2 parakeets.  That has been helpful.  She also enjoys spending time with her dogs.  They are therapeutic to her.  Reports continues to struggle with sadness, hopelessness, concentration problem, as well as anxiety.  Reports sleep is good on the trazodone.  Patient denies any suicidality, homicidality or perceptual disturbances.  Currently compliant on medications, denies side effects.  Denies any other concerns today.  Visit Diagnosis:    ICD-10-CM   1. Bipolar 1 disorder, mixed, moderate (HCC)  F31.62 divalproex (DEPAKOTE ER) 250 MG 24 hr tablet    Valproic acid level    2. GAD (generalized anxiety disorder)  F41.1     3. Insomnia due to mental disorder  F51.05    mood    4. Bereavement  Z63.4     5. Restless legs syndrome (RLS)  G25.81     6. MCI (mild cognitive impairment)  G31.84     7. High risk medication use  Z79.899 Valproic acid level      Past Psychiatric History: Reviewed past psychiatric history from progress note on 10/12/2018.  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   Chronic obstructive pulmonary disease ([facility]) 10/03/2015   COPD (chronic obstructive pulmonary disease) (HCC)    no inhalers   Elevated liver function tests 11/26/2004   H/O NEGATIVE LIVER BIOPSY, workup negative   Gallstones 02/21/2015   GERD (gastroesophageal reflux disease)     Hyperlipidemia    Insomnia    Lower back pain    Osteoarthritis    Ovarian cancer ([facility]) 1995   RLS (restless legs syndrome)    Sedative, hypnotic or anxiolytic dependence with withdrawal, 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[facility])    Wears dentures    full upper and lower    Past Surgical History:  Procedure Laterality Date   ABDOMINAL HYSTERECTOMY     APPENDECTOMY     BREAST BIOPSY Left 03/12/2015   fat necrosis   CESAREAN SECTION     COLONOSCOPY WITH PROPOFOL N/A 05/30/2018   Procedure: COLONOSCOPY WITH PROPOFOL;  Surgeon: Manya Silvas, MD;  Location: [facility] ENDOSCOPY;  Service: Endoscopy;  Laterality: N/A;   EXCISION MORTON'S NEUROMA Left 10/07/2016   Procedure: EXCISION MORTON'S NEUROMA;  Surgeon: [name], DPM;  Location: [facility];  Service: Podiatry;  Laterality: Left;  IV WITH LOCAL   LIVER BIOPSY  2005   PerPt: Done to eval elevated LFTs and biopsy provided no definite dx/cause of elevated LFTs   MULTIPLE TOOTH EXTRACTIONS     OOPHORECTOMY     OSTECTOMY Right 04/10/2020   Procedure: DOUBLE OSTEOTOMY GENERAL W/ LOCAL;  Surgeon: [name], DPM;  Location: [facility];  Service: Podiatry;  Laterality: Right;    Family Psychiatric History: Reviewed family psychiatric history from progress note on 10/12/2018.  Family History:  Family History  Adopted: Yes  Problem Relation Age of Onset   COPD Mother    Depression Mother    Colon cancer Mother        unsure of age   72 Father        Lung CA, Skin Cancer--?type   COPD Father    Stroke Father    Cancer Sister 38       cervical cancer   COPD Brother    Alcohol abuse Brother    Stroke Brother    COPD Maternal Aunt    Depression Daughter    Schizophrenia Son    Breast cancer Neg Hx     Social History: Reviewed social history from progress note on 10/12/2018. Social History   Socioeconomic History   Marital status: Widowed    Spouse name: Not on file   Number of children: Not on file   Years of  education: Not on file   Highest education level: Not on file  Occupational History   Not on file  Tobacco Use   Smoking status: Former    Packs/day: 1.00    Years: 30.00    Total pack years: 30.00    Types: Cigarettes    Quit date: 04/08/2009    Years since quitting: 13.0   Smokeless tobacco: Never  Vaping Use   Vaping Use: Never used  Substance and Sexual Activity   Alcohol use: Not Currently    Alcohol/week: 3.0 - 7.0 standard drinks of alcohol    Types: 3 - 7 Shots of liquor per week    Comment: occasionally   Drug use: Not Currently    Types: Marijuana    Comment: told to refrain until after surgery   Sexual activity: Yes    Birth control/protection: Surgical  Other Topics Concern   Not on file  Social History Narrative   Not on file   Social Determinants of Health   Financial Resource Strain: Low Risk  (03/13/2021)   Overall Financial Resource Strain (CARDIA)    Difficulty of Paying Living Expenses: Not very hard  Food Insecurity: Not on file  Transportation Needs: Not on file  Physical Activity: Not on file  Stress: Not on file  Social Connections: Not on file    Allergies:  Allergies  Allergen Reactions   Benadryl [Diphenhydramine Hcl]     Hives/ rapid heartrate   Codeine     dizziness   Vicodin [Hydrocodone-Acetaminophen] Other (See Comments)    dizziness    Metabolic Disorder Labs: Lab Results  Component Value Date   HGBA1C 5.5 03/30/2022   MPG 114 09/14/2018   No results found for: "PROLACTIN" Lab Results  Component Value Date   CHOL 121 03/30/2022   TRIG 131 03/30/2022   HDL 42 03/30/2022   CHOLHDL 2.9 03/30/2022   VLDL 18 03/04/2015   LDLCALC 56 03/30/2022   LDLCALC 87 06/18/2020   Lab Results  Component Value Date   TSH 2.600 03/30/2022   TSH 1.51 12/19/2019    Therapeutic Level Labs: Lab Results  Component Value Date   LITHIUM 0.3 (L) 10/24/2018   LITHIUM 0.49 (L) 01/13/2009   Lab Results  Component Value Date    VALPROATE 73 10/08/2021   VALPROATE 27 (L) 09/22/2021   No results found for: "CBMZ"  Current Medications: Current Outpatient Medications  Medication Sig Dispense Refill   alendronate (FOSAMAX) 70 MG tablet Take 70 mg by mouth once a week.     ammonium lactate (AMLACTIN) 12 % cream APPLY TOPICALLY AS NEEDED FOR DRY SKIN 385 g 0   budesonide-formoterol (SYMBICORT) 160-4.5 MCG/ACT inhaler Inhale 2 puffs into the lungs 2 (two) times daily. 3 each 3   buPROPion (WELLBUTRIN XL) 150 MG 24 hr tablet Take 1 tablet (150 mg total) by mouth daily. 90 tablet 2   clonazePAM (KLONOPIN) 0.5 MG tablet TAKE 1 TABLET 2 TO 3 TIMES A WEEK AS DIRECTED FOR SEVERE PANIC ATTACKS ONLY 13 tablet 1   divalproex (DEPAKOTE ER) 250 MG 24 hr tablet Take 1 tablet (250 mg total) by mouth daily. Take along with 1000 mg daily- total of 1250 mg daily 90 tablet 0   divalproex (DEPAKOTE ER) 500 MG 24 hr tablet TAKE 2 TABLETS  DAILY. 180 tablet 0   esomeprazole (NEXIUM) 20 MG capsule Take 1 capsule (20 mg total) by mouth daily as needed (take on empty stomach). 90 capsule 1   fluticasone (FLONASE) 50 MCG/ACT nasal spray Place 2 sprays into both nostrils daily. 16 mL 2   hydrochlorothiazide (HYDRODIURIL) 12.5 MG tablet      hydrOXYzine (ATARAX) 50 MG tablet TAKE 1 TABLET TWICE DAILY AS NEEDED FOR SEVERE ANXIETY ATTACKS 180 tablet 0   meloxicam (MOBIC) 15 MG tablet Take 1 tablet (15 mg total) by mouth daily. 30 tablet 0   methocarbamol (ROBAXIN)  500 MG tablet Take 1 tablet (500 mg total) by mouth daily. 30 tablet 1   rosuvastatin (CRESTOR) 10 MG tablet TAKE 1 TABLET EVERY DAY 90 tablet 3   traZODone (DESYREL) 100 MG tablet TAKE 2 TABLETS AT BEDTIME AS NEEDED FOR SLEEP 180 tablet 0   ziprasidone (GEODON) 20 MG capsule Take 1 capsule (20 mg total) by mouth daily with breakfast. 90 capsule 0   No current facility-administered medications for this visit.     Musculoskeletal: Strength & Muscle Tone:  UTA Gait & Station:   Seated Patient leans: N/A  Psychiatric Specialty Exam: Review of Systems  Musculoskeletal:  Positive for back pain (chronic) and neck pain (chronic).  Psychiatric/Behavioral:  Positive for decreased concentration and dysphoric mood.   All other systems reviewed and are negative.   There were no vitals taken for this visit.There is no height or weight on file to calculate BMI.  General Appearance: Casual  Eye Contact:  Fair  Speech:  Clear and Coherent  Volume:  Normal  Mood:  Depressed  Affect:  Tearful  Thought Process:  Goal Directed and Descriptions of Associations: Intact  Orientation:  Full (Time, Place, and Person)  Thought Content: Logical   Suicidal Thoughts:  No  Homicidal Thoughts:  No  Memory:  Immediate;   Fair Recent;   Fair Remote;   Limited  Judgement:  Fair  Insight:  Fair  Psychomotor Activity:   normal  Concentration:  Concentration: Fair and Attention Span: Fair  Recall:  [employer] of Knowledge: Fair  Language: Fair  Akathisia:  No  Handed:  Left  AIMS (if indicated): done  Assets:  Communication Skills Desire for Improvement Housing Social Support  ADL's:  Intact  Cognition: WNL  Sleep:  Fair   Screenings: AIMS    Flowsheet Row Video Visit from 03/24/2022 in [facility] Video Visit from 06/04/2021 in [facility] Visit from 01/28/2021 in [facility] Visit from 10/24/2018 in [facility] Office Visit from 04/04/2018 in [facility] Total Score 0 0 0 5 0      GAD-7    Flowsheet Row Counselor from 03/03/2022 in [facility] Video Visit from 01/13/2022 in [facility] Video Visit from 06/25/2021 in [facility] Counselor from 03/10/2021 in [facility]  Total  GAD-7 Score '15 7 17 12      '$ PHQ2-9    Flowsheet Row Video Visit from 04/23/2022 in [facility] Most recent reading at 04/23/2022  8:46 AM Office Visit from 04/20/2022 in [facility] Most recent reading at 04/20/2022  1:38 PM Clinical Support from 04/20/2022 in [facility] Most recent reading at 04/20/2022  9:42 AM Office Visit from 03/26/2022 in [facility] Most recent reading at 03/26/2022  9:23 AM Video Visit from 03/24/2022 in [facility] Most recent reading at 03/24/2022 10:17 AM  PHQ-2 Total Score 6 0 '6 6 6  '$ PHQ-9 Total Score 10 -- '7 21 14      '$ Flowsheet Row Video Visit from 04/23/2022 in [facility] Video Visit from 03/24/2022 in [facility] Video Visit from 12/24/2021 in [facility] No Risk Low Risk        Assessment and Plan: Stacy Chambers is a 56 year old Caucasian female, widowed, lives in [place], has a  history of bipolar disorder, anxiety disorder, COPD, RLS, vitamin B12 deficiency was evaluated by telemedicine today.  Patient is currently struggling with mood symptoms and grief reaction, will benefit from the following medication changes.  Plan  Bipolar 1 disorder mixed moderate-unstable Increase Depakote ER to 1250 mg p.o. daily Wellbutrin XL 150 mg p.o. daily Trazodone 100-200 mg p.o. nightly for sleep Will refer for intensive outpatient program.   GAD-unstable Patient advised to continue CBT She had an appointment with Mr. Deforest Hoyles yesterday. Depakote increased as noted above. Klonopin 0.5 mg up to 2-3 times a week only for severe panic attacks Will refer for intensive outpatient program.   Bereavement-unstable Continue CBT Will refer for intensive outpatient program.  RLS-improving Will monitor closely   High risk medication use-will order  Depakote level-patient to go to [place] [facility] 5 days of being on the higher dosage of Depakote. Reviewed and discussed CBC-dated 03/30/2022-within normal limits, CMP-within normal limits.  History of MCI-patient was referred for neurology consultation previously.  Will monitor closely.  Follow-up in clinic in 3 to 4 weeks or sooner if needed.       Collaboration of Care: Collaboration of Care: Referral or follow-up with counselor/therapist AEB patient to continue to follow up with therapist. and Other referred for [facility] IOP-patient encouraged to participate.  Patient/Guardian was advised Release of Information must be obtained prior to any record release in order to collaborate their care with an outside provider. Patient/Guardian was advised if they have not already done so to contact the registration department to sign all necessary forms in order for [name] to release information regarding their care.   Consent: Patient/Guardian gives verbal consent for treatment and assignment of benefits for services provided during this visit. Patient/Guardian expressed understanding and agreed to proceed.    This note was generated in part or whole with voice recognition software. Voice recognition is usually quite accurate but there are transcription errors that can and very often do occur. I apologize for any typographical errors that were not detected and corrected.      Ursula Alert, MD 04/23/2022, 12:08 PM

## 2022-05-05 ENCOUNTER — Ambulatory Visit (INDEPENDENT_AMBULATORY_CARE_PROVIDER_SITE_OTHER): Payer: Medicare HMO | Admitting: Clinical

## 2022-05-05 DIAGNOSIS — F5105 Insomnia due to other mental disorder: Secondary | ICD-10-CM | POA: Diagnosis not present

## 2022-05-05 DIAGNOSIS — R41840 Attention and concentration deficit: Secondary | ICD-10-CM | POA: Diagnosis not present

## 2022-05-05 DIAGNOSIS — F3162 Bipolar disorder, current episode mixed, moderate: Secondary | ICD-10-CM | POA: Diagnosis not present

## 2022-05-05 DIAGNOSIS — F411 Generalized anxiety disorder: Secondary | ICD-10-CM | POA: Diagnosis not present

## 2022-05-05 NOTE — Progress Notes (Signed)
Virtual Visit via Video Note   I connected with Stacy Chambers on 05/05/2022 at  10:00 AM EST by a video enabled telemedicine application and verified that I am speaking with the correct person using two identifiers.   Location: Patient: Home Provider: Office   I discussed the limitations of evaluation and management by telemedicine and the availability of in person appointments. The patient expressed understanding and agreed to proceed.   THERAPIST PROGRESS NOTE   Session Time: 10:00 AM-10:30 AM   Participation Level: Active   Behavioral Response: CasualAlertDepressed   Type of Therapy: Individual Therapy   Treatment Goals addressed: Coping   Interventions: CBT   Summary: Stacy Chambers is a 56 y.o. female who presents with Bipolar Disorder./ GAD/Insomnia. The OPT therapist worked with the patient for her ongoing OPT treatment session. The OPT therapist utilized Motivational Interviewing to assist in creating therapeutic repore. The patient in the session was engaged and work in collaboration giving feedback about her triggers and symptoms over the past few weeks.The patient spoke about the impact of going through hard anniversary's of losing loved ones. The patient spoke about celebrating the 4th of July holiday.The OPT therapist utilized Cognitive Behavioral Therapy through cognitive restructuring as well as worked with the patient on coping strategies to assist in management of mood. The OPT therapist worked with the patient on continuing to maintain her basic needs of self care with eating, hygiene, and sleep. The OPT therapist reviewed with the patient upcoming meeting with psychiatrist Dr. Shea Evans 06/01/2022 and Maurine Minister with [facility]. The patient spoke about feeling better over the past few days after going through the anniversary of family losses. The patient spoke about her pets in the home and they are serving as companionship but also giving the patient purpose in  caretaking. The patient spoke about going out more with her brother and his fiance. The patient spoke about going through clothes and cleaning at home.The OPT therapist worked with the patient on utilizing coping, staying active, and utilizing her existing support network.   Suicidal/Homicidal: Nowithout intent/plan   Therapist Response: The OPT therapist worked with the patient for the patients scheduled session. The patient was engaged in her session and gave feedback in relation to triggers, symptoms, and behavior responses over the past few weeks. The OPT therapist worked with the patient utilizing an in session Cognitive Behavioral Therapy exercise. The patient was responsive in the session and verbalized," I am feeling a little better now that I am passed the anniversaries".  The OPT therapist worked with the patient on staying active , managing her basic health needs, and being consistent with her medication therapy plan. The patient spoke about the impact of her physical health on her mobility and functioning additionally the patient spoke about difficulty with her memory. The patient spoke about receiving 2 parakeets.The OPT therapist will continue treatment work with the patient in her next scheduled session    Plan: Return again in 3 weeks.   Diagnosis:      Axis I: Bipolar 1 disorder,depressed mild/GAD/Insomnia/ Attention deficit.                             Axis II: No diagnosis     Collaboration of Care: collaboration of care reviewed with medication therapy involvement with psychiatrist Dr. Shea Evans.   Patient/Guardian was advised Release of Information must be obtained prior to any record release in order to collaborate their  care with an outside provider. Patient/Guardian was advised if they have not already done so to contact the registration department to sign all necessary forms in order for [name] to release information regarding their care.    Consent: Patient/Guardian gives verbal  consent for treatment and assignment of benefits for services provided during this visit. Patient/Guardian expressed understanding and agreed to proceed.        I discussed the assessment and treatment plan with the patient. The patient was provided an opportunity to ask questions and all were answered. The patient agreed with the plan and demonstrated an understanding of the instructions.   The patient was advised to call back or seek an in-person evaluation if the symptoms worsen or if the condition fails to improve as anticipated.   I provided 30 minutes of non-face-to-face time during this encounter.   [name], LCSW   05/05/2022

## 2022-05-18 ENCOUNTER — Other Ambulatory Visit: Payer: Self-pay | Admitting: Family Medicine

## 2022-05-18 DIAGNOSIS — G8929 Other chronic pain: Secondary | ICD-10-CM

## 2022-06-01 ENCOUNTER — Encounter: Payer: Self-pay | Admitting: Psychiatry

## 2022-06-01 ENCOUNTER — Telehealth: Payer: Medicare HMO | Admitting: Psychiatry

## 2022-06-01 DIAGNOSIS — Z79899 Other long term (current) drug therapy: Secondary | ICD-10-CM | POA: Diagnosis not present

## 2022-06-01 DIAGNOSIS — F3178 Bipolar disorder, in full remission, most recent episode mixed: Secondary | ICD-10-CM | POA: Diagnosis not present

## 2022-06-01 DIAGNOSIS — G3184 Mild cognitive impairment, so stated: Secondary | ICD-10-CM

## 2022-06-01 DIAGNOSIS — F5105 Insomnia due to other mental disorder: Secondary | ICD-10-CM | POA: Diagnosis not present

## 2022-06-01 DIAGNOSIS — Z634 Disappearance and death of family member: Secondary | ICD-10-CM

## 2022-06-01 DIAGNOSIS — G2581 Restless legs syndrome: Secondary | ICD-10-CM | POA: Diagnosis not present

## 2022-06-01 DIAGNOSIS — F411 Generalized anxiety disorder: Secondary | ICD-10-CM | POA: Diagnosis not present

## 2022-06-01 MED ORDER — DIVALPROEX SODIUM ER 500 MG PO TB24: 1000.0000 mg | ORAL_TABLET | Freq: Every day | ORAL | 0 refills | Status: DC

## 2022-06-01 MED ORDER — ZIPRASIDONE HCL 20 MG PO CAPS: 20.0000 mg | ORAL_CAPSULE | Freq: Every day | ORAL | 0 refills | Status: DC

## 2022-06-01 NOTE — Progress Notes (Unsigned)
Virtual Visit via Video Note  I connected with Stacy Chambers on 06/01/22 at  4:00 PM EDT by a video enabled telemedicine application and verified that I am speaking with the correct person using two identifiers.  Location Provider Location : [facility] Patient Location : Home  Participants: Patient , Provider    I discussed the limitations of evaluation and management by telemedicine and the availability of in person appointments. The patient expressed understanding and agreed to proceed.   I discussed the assessment and treatment plan with the patient. The patient was provided an opportunity to ask questions and all were answered. The patient agreed with the plan and demonstrated an understanding of the instructions.   The patient was advised to call back or seek an in-person evaluation if the symptoms worsen or if the condition fails to improve as anticipated.                                                                          [facility] MD OP Progress Note  06/01/2022 4:23 PM Stacy Chambers  MRN:  [id number]  Chief Complaint:  Chief Complaint  Patient presents with   Follow-up: 56 year old Caucasian female with history of bipolar disorder, GAD, insomnia, grief, presented for medication management.   HPI: Stacy Chambers is a 56 year old Caucasian female, lives in [place], has a history of bipolar disorder, GAD, insomnia, bereavement, RLS, MCI, memory loss, vitamin B12 deficiency was evaluated by telemedicine today.  Patient today reports she is currently grieving the loss of her husband who passed away a year ago in 06/14/2021.  Patient reports recently she was reminded of how her husband passed away and how much of pain he was not when her brother's girlfriend who lives in the same house was struggling with pain.  Patient became tearful when she discussed this.  Patient however reports she has been talking to her therapist.  She also has been talking to a friend and they are spending  time together.  Patient reports she currently does not have any significant mood swings or anxiety symptoms.  Reports sleep is overall good.  Denies any suicidality, homicidality or perceptual disturbances.  Currently compliant on her medications including the Depakote.  Denies side effects.  Reports she was unable to get Depakote labs done however agrees to get it done soon.  Denies any other concerns today.  Visit Diagnosis:    ICD-10-CM   1. Bipolar disorder, in full remission, most recent episode mixed (HCC)  F31.78 Valproic acid level    ziprasidone (GEODON) 20 MG capsule    2. GAD (generalized anxiety disorder)  F41.1 ziprasidone (GEODON) 20 MG capsule    divalproex (DEPAKOTE ER) 500 MG 24 hr tablet    3. Insomnia due to mental disorder  F51.05    mood    4. Bereavement  Z63.4     5. Restless legs syndrome (RLS)  G25.81     6. MCI (mild cognitive impairment)  G31.84     7. High risk medication use  Z79.899 Valproic acid level      Past Psychiatric History: Reviewed past psychiatric history from progress note on 10/12/2018.  Past trials of medications like BuSpar, Zoloft, carbamazepine, Vraylar(noncompliant), Zyprexa,  Lamictal, Lexapro, gabapentin, risperidone.  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   Chronic obstructive pulmonary disease ([place]) 10/03/2015   COPD (chronic obstructive pulmonary disease) (HCC)    no inhalers   Elevated liver function tests 11/26/2004   H/O NEGATIVE LIVER BIOPSY, workup negative   Gallstones 02/21/2015   GERD (gastroesophageal reflux disease)    Hyperlipidemia    Insomnia    Lower back pain     Osteoarthritis    Ovarian cancer ([place]) 1995   RLS (restless legs syndrome)    Sedative, hypnotic or anxiolytic dependence with withdrawal, 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[place])    Wears dentures    full upper and lower    Past Surgical History:  Procedure Laterality Date   ABDOMINAL HYSTERECTOMY     APPENDECTOMY     BREAST BIOPSY Left 03/12/2015   fat necrosis   CESAREAN SECTION     COLONOSCOPY WITH PROPOFOL N/A 05/30/2018   Procedure: COLONOSCOPY WITH PROPOFOL;  Surgeon: Manya Silvas, MD;  Location: [facility] ENDOSCOPY;  Service: Endoscopy;  Laterality: N/A;   EXCISION MORTON'S NEUROMA Left 10/07/2016   Procedure: EXCISION MORTON'S NEUROMA;  Surgeon: [name], DPM;  Location: [facility];  Service: Podiatry;  Laterality: Left;  IV WITH LOCAL   LIVER BIOPSY  2005   PerPt: Done to eval elevated LFTs and biopsy provided no definite dx/cause of elevated LFTs   MULTIPLE TOOTH EXTRACTIONS     OOPHORECTOMY     OSTECTOMY Right 04/10/2020   Procedure: DOUBLE OSTEOTOMY GENERAL W/ LOCAL;  Surgeon: [name], DPM;  Location: [facility];  Service: Podiatry;  Laterality: Right;    Family Psychiatric History: Reviewed family psychiatric history from progress note on 10/12/2018.  Family History:  Family History  Adopted: Yes  Problem Relation Age of Onset   COPD Mother    Depression Mother    Colon cancer Mother        unsure of age   29 Father        Lung CA, Skin Cancer--?type   COPD Father    Stroke Father    Cancer Sister 88       cervical cancer   COPD Brother    Alcohol abuse Brother    Stroke Brother    COPD Maternal Aunt    Depression Daughter    Schizophrenia Son    Breast cancer Neg Hx     Social History: Reviewed social history from progress note on 10/12/2018. Social History   Socioeconomic History   Marital status: Widowed    Spouse name: Not on file   Number of children: Not on file   Years of education: Not on file   Highest education level: Not on  file  Occupational History   Not on file  Tobacco Use   Smoking status: Former    Packs/day: 1.00    Years: 30.00    Total pack years: 30.00    Types: Cigarettes    Quit date: 04/08/2009    Years since quitting: 13.1   Smokeless tobacco: Never  Vaping Use   Vaping Use: Never used  Substance and Sexual Activity   Alcohol use: Not Currently    Alcohol/week: 3.0 - 7.0 standard drinks of alcohol    Types: 3 - 7 Shots of liquor per week    Comment: occasionally   Drug use: Not Currently    Types: Marijuana    Comment: told to refrain until after surgery   Sexual activity: Yes    Birth control/protection: Surgical  Other Topics Concern   Not on file  Social History Narrative   Not on file   Social Determinants of Health   Financial Resource Strain: Low Risk  (03/13/2021)   Overall Financial Resource Strain (CARDIA)    Difficulty of Paying Living Expenses: Not very hard  Food Insecurity: Not on file  Transportation Needs: Not on file  Physical Activity: Not on file  Stress: Not on file  Social Connections: Not on file    Allergies:  Allergies  Allergen Reactions   Benadryl [Diphenhydramine Hcl]     Hives/ rapid heartrate   Codeine     dizziness   Vicodin [Hydrocodone-Acetaminophen] Other (See Comments)    dizziness    Metabolic Disorder Labs: Lab Results  Component Value Date   HGBA1C 5.5 03/30/2022   MPG 114 09/14/2018   No results found for: "PROLACTIN" Lab Results  Component Value Date   CHOL 121 03/30/2022   TRIG 131 03/30/2022   HDL 42 03/30/2022   CHOLHDL 2.9 03/30/2022   VLDL 18 03/04/2015   LDLCALC 56 03/30/2022   LDLCALC 87 06/18/2020   Lab Results  Component Value Date   TSH 2.600 03/30/2022   TSH 1.51 12/19/2019    Therapeutic Level Labs: Lab Results  Component Value Date   LITHIUM 0.3 (L) 10/24/2018   LITHIUM 0.49 (L) 01/13/2009   Lab Results  Component Value Date   VALPROATE 73 10/08/2021   VALPROATE 27 (L) 09/22/2021   No  results found for: "CBMZ"  Current Medications: Current Outpatient Medications  Medication Sig Dispense Refill   ammonium lactate (AMLACTIN) 12 % cream APPLY TOPICALLY AS NEEDED FOR DRY SKIN 385 g 0   budesonide-formoterol (SYMBICORT) 160-4.5 MCG/ACT inhaler Inhale 2 puffs into the lungs 2 (two) times daily. 3 each 3   buPROPion (WELLBUTRIN XL) 150 MG 24 hr tablet Take 1 tablet (150 mg total) by mouth daily. 90 tablet 2   clonazePAM (KLONOPIN) 0.5 MG tablet TAKE 1 TABLET 2 TO 3 TIMES A WEEK AS DIRECTED FOR SEVERE PANIC ATTACKS ONLY 13 tablet 1   divalproex (DEPAKOTE ER) 250 MG 24 hr tablet Take 1 tablet (250 mg total) by mouth daily. Take along with 1000 mg daily- total of 1250 mg daily 90 tablet 0   esomeprazole (NEXIUM) 20 MG capsule Take 1 capsule (20 mg total) by mouth daily as needed (take on empty stomach). 90 capsule 1   fluticasone (FLONASE) 50 MCG/ACT nasal spray Place 2 sprays into both nostrils daily. 16 mL 2   hydrochlorothiazide (HYDRODIURIL) 12.5 MG tablet      hydrOXYzine (ATARAX) 50 MG tablet TAKE 1 TABLET TWICE DAILY AS NEEDED FOR SEVERE ANXIETY ATTACKS 180 tablet 0   meloxicam (MOBIC) 15 MG tablet Take 1 tablet (15 mg total) by mouth daily. 30 tablet 0   methocarbamol (ROBAXIN) 500 MG tablet TAKE 1 TABLET EVERY DAY 60 tablet 1   rosuvastatin (CRESTOR) 10 MG tablet TAKE 1 TABLET EVERY DAY 90 tablet 3   traZODone (DESYREL) 100 MG tablet TAKE 2 TABLETS AT BEDTIME AS NEEDED FOR SLEEP (Patient taking  differently: Take 100 mg by mouth at bedtime as needed.) 180 tablet 0   divalproex (DEPAKOTE ER) 500 MG 24 hr tablet Take 2 tablets (1,000 mg total) by mouth daily. Take along with 250 mg daily - total of 1250 mg 180 tablet 0   ziprasidone (GEODON) 20 MG capsule Take 1 capsule (20 mg total) by mouth daily with breakfast. 90 capsule 0   No current facility-administered medications for this visit.     Musculoskeletal: Strength & Muscle Tone:  UTA Gait & Station:  Seated Patient  leans: N/A  Psychiatric Specialty Exam: Review of Systems  Psychiatric/Behavioral:         Grieving  All other systems reviewed and are negative.   There were no vitals taken for this visit.There is no height or weight on file to calculate BMI.  General Appearance: Casual  Eye Contact:  Fair  Speech:  Clear and Coherent  Volume:  Normal  Mood:   grieving  Affect:  Tearful  Thought Process:  Goal Directed and Descriptions of Associations: Intact  Orientation:  Full (Time, Place, and Person)  Thought Content: Logical   Suicidal Thoughts:  No  Homicidal Thoughts:  No  Memory:  Immediate;   Fair Recent;   Fair Remote;   Limited  Judgement:  Fair  Insight:  Fair  Psychomotor Activity:  Normal  Concentration:  Concentration: Fair and Attention Span: Fair  Recall:  [employer] of Knowledge: Fair  Language: Fair  Akathisia:  No  Handed:  Left  AIMS (if indicated): done  Assets:  Communication Skills Desire for Improvement Housing Social Support  ADL's:  Intact  Cognition: WNL  Sleep:  Fair   Screenings: AIMS    Flowsheet Row Video Visit from 06/01/2022 in [facility] Video Visit from 03/24/2022 in [facility] Video Visit from 06/04/2021 in [facility] Visit from 01/28/2021 in [facility] Office Visit from 10/24/2018 in [facility] Total Score 0 0 0 0 5      GAD-7    Flowsheet Row Counselor from 03/03/2022 in [facility] Video Visit from 01/13/2022 in [facility] Video Visit from 06/25/2021 in [facility] Counselor from 03/10/2021 in [facility]  Total GAD-7 Score '15 7 17 12      '$ PHQ2-9    Flowsheet Row Video Visit from 06/01/2022 in [facility] Most  recent reading at 06/01/2022  4:04 PM Video Visit from 04/23/2022 in [facility] Most recent reading at 04/23/2022  8:46 AM Office Visit from 04/20/2022 in [facility] Most recent reading at 04/20/2022  1:38 PM Clinical Support from 04/20/2022 in [facility] Most recent reading at 04/20/2022  9:42 AM Office Visit from 03/26/2022 in [facility] Most recent reading at 03/26/2022  9:23 AM  PHQ-2 Total Score 1 6 0 6 6  PHQ-9 Total Score -- 10 -- 7 21      Flowsheet Row Video Visit from 06/01/2022 in [facility] Video Visit from 04/23/2022 in [facility] Video Visit from 03/24/2022 in [facility] Low Risk Low Risk No Risk        Assessment and Plan: Stacy Chambers is a 56 year old Caucasian female, widowed, lives in [place], has a history of bipolar disorder, anxiety disorder, COPD, RLS, vitamin B12 deficiency was evaluated by  telemedicine today.  Patient is currently grieving although improving, will benefit from following plan.  Plan Bipolar disorder in remission Depakote ER 1250 mg p.o. daily Wellbutrin XL 150 mg p.o. daily Trazodone 100-200 mg p.o. nightly for sleep   GAD-improving Patient to continue CBT with Mr. Maye Hides Klonopin 0.5 mg up to 2-3 times a week only for severe anxiety attacks.  Patient has been limiting use.  Reviewed [place] PMP aware   Bereavement-improving Continue CBT  RLS-stable Will monitor closely  High risk medication use-patient has been noncompliant with Depakote level-encourage compliance.  Will order Depakote level again.  Patient agrees to go to [facility] lab.  History of MCI-was referred for neurological consultation previously.  Follow-up in clinic in 2 months or sooner if needed.    Collaboration of Care: Collaboration of Care: Referral or follow-up with counselor/therapist AEB  encouraged to continue CBT.  Patient/Guardian was advised Release of Information must be obtained prior to any record release in order to collaborate their care with an outside provider. Patient/Guardian was advised if they have not already done so to contact the registration department to sign all necessary forms in order for [name] to release information regarding their care.   Consent: Patient/Guardian gives verbal consent for treatment and assignment of benefits for services provided during this visit. Patient/Guardian expressed understanding and agreed to proceed.   This note was generated in part or whole with voice recognition software. Voice recognition is usually quite accurate but there are transcription errors that can and very often do occur. I apologize for any typographical errors that were not detected and corrected.      Ursula Alert, MD 06/01/2022, 4:23 PM

## 2022-06-02 ENCOUNTER — Telehealth: Payer: Self-pay | Admitting: Psychiatry

## 2022-06-02 ENCOUNTER — Other Ambulatory Visit
Admission: RE | Admit: 2022-06-02 | Discharge: 2022-06-02 | Disposition: A | Discharge status: Home / Self Care | Payer: Medicare HMO | Source: Ambulatory Visit | Attending: Psychiatry | Admitting: Psychiatry

## 2022-06-02 DIAGNOSIS — F3178 Bipolar disorder, in full remission, most recent episode mixed: Secondary | ICD-10-CM | POA: Diagnosis not present

## 2022-06-02 DIAGNOSIS — Z79899 Other long term (current) drug therapy: Secondary | ICD-10-CM | POA: Insufficient documentation

## 2022-06-02 LAB — VALPROIC ACID LEVEL: Valproic Acid Lvl: 29 ug/mL — ABNORMAL LOW (ref 50.0–100.0)

## 2022-06-02 NOTE — Telephone Encounter (Signed)
Contacted patient to discuss Depakote level-subtherapeutic.  Patient reports she may have missed her Depakote, not sure how many times.  Encourage compliance on the Depakote.  Once she is compliant for a few weeks, could repeat the levels again.  She currently reports she does not need any medication changes since mood symptoms are stable.

## 2022-06-04 ENCOUNTER — Other Ambulatory Visit: Payer: Self-pay | Admitting: Family Medicine

## 2022-06-04 DIAGNOSIS — M818 Other osteoporosis without current pathological fracture: Secondary | ICD-10-CM

## 2022-06-18 ENCOUNTER — Other Ambulatory Visit: Payer: Self-pay

## 2022-06-18 DIAGNOSIS — G8929 Other chronic pain: Secondary | ICD-10-CM

## 2022-06-18 MED ORDER — MELOXICAM 15 MG PO TABS: 15.0000 mg | ORAL_TABLET | Freq: Every day | ORAL | 0 refills | Status: DC

## 2022-06-23 ENCOUNTER — Ambulatory Visit (HOSPITAL_COMMUNITY): Payer: Medicare HMO | Admitting: Clinical

## 2022-06-23 DIAGNOSIS — F3131 Bipolar disorder, current episode depressed, mild: Secondary | ICD-10-CM

## 2022-06-23 DIAGNOSIS — F411 Generalized anxiety disorder: Secondary | ICD-10-CM

## 2022-06-23 DIAGNOSIS — F5105 Insomnia due to other mental disorder: Secondary | ICD-10-CM

## 2022-06-23 DIAGNOSIS — R41840 Attention and concentration deficit: Secondary | ICD-10-CM | POA: Diagnosis not present
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
## 2022-06-25 ENCOUNTER — Other Ambulatory Visit: Payer: Self-pay | Admitting: Psychiatry

## 2022-06-25 DIAGNOSIS — Z634 Disappearance and death of family member: Secondary | ICD-10-CM

## 2022-06-26 ENCOUNTER — Encounter: Payer: Self-pay | Admitting: Family Medicine

## 2022-06-26 ENCOUNTER — Other Ambulatory Visit: Payer: Self-pay | Admitting: Family Medicine

## 2022-06-26 ENCOUNTER — Ambulatory Visit (INDEPENDENT_AMBULATORY_CARE_PROVIDER_SITE_OTHER): Payer: Medicare HMO | Admitting: Family Medicine

## 2022-06-26 VITALS — BP 115/73 | HR 67 | Ht 64.0 in | Wt 164.0 lb

## 2022-06-26 DIAGNOSIS — F411 Generalized anxiety disorder: Secondary | ICD-10-CM | POA: Diagnosis not present

## 2022-06-26 DIAGNOSIS — G8929 Other chronic pain: Secondary | ICD-10-CM | POA: Diagnosis not present

## 2022-06-26 DIAGNOSIS — E785 Hyperlipidemia, unspecified: Secondary | ICD-10-CM | POA: Diagnosis not present

## 2022-06-26 DIAGNOSIS — I10 Essential (primary) hypertension: Secondary | ICD-10-CM

## 2022-06-26 DIAGNOSIS — Z23 Encounter for immunization: Secondary | ICD-10-CM | POA: Diagnosis not present

## 2022-06-26 DIAGNOSIS — E7849 Other hyperlipidemia: Secondary | ICD-10-CM

## 2022-06-26 MED ORDER — MELOXICAM 15 MG PO TABS: 15.0000 mg | ORAL_TABLET | Freq: Every day | ORAL | 0 refills | Status: DC

## 2022-06-26 MED ORDER — ROSUVASTATIN CALCIUM 10 MG PO TABS: 10.0000 mg | ORAL_TABLET | Freq: Every day | ORAL | 1 refills | Status: DC

## 2022-06-26 MED ORDER — HYDROCHLOROTHIAZIDE 12.5 MG PO TABS: 12.5000 mg | ORAL_TABLET | Freq: Every day | ORAL | 1 refills | Status: DC

## 2022-06-26 MED ORDER — MELOXICAM 15 MG PO TABS: 15.0000 mg | ORAL_TABLET | Freq: Every day | ORAL | 1 refills | Status: DC

## 2022-06-26 NOTE — Progress Notes (Unsigned)
Established Patient Office Visit  Subjective:  Patient ID: Stacy Chambers, female    DOB: 1966-02-24  Age: 56 y.o. MRN: [id number]  CC:  Chief Complaint  Patient presents with   Annual Exam    Pt is requesting an annual physical for her insurance for todays visit.  Would like to discuss about an rx for meloxicam for 90 day supply instead of 30 days.     HPI Stacy Chambers is a 56 y.o. female with past medical history of Bipolar, osteopenia, hyperlipidemia presents for f/u of  chronic medical conditions. Bipolar 1 disorder: in remission and compliant with medication regimen. She reports following up with her psychiatrist on 06/23/22.  Anxiety and depression: She follows up with her psychiatrist. Her last visit was on 06/23/22. No complaints or concerns today. She reports medication adherence.   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   Chronic obstructive pulmonary disease ([place]) 10/03/2015   COPD (chronic obstructive pulmonary disease) (HCC)    no inhalers   Elevated liver function tests 11/26/2004   H/O NEGATIVE LIVER BIOPSY, workup negative   Gallstones 02/21/2015   GERD (gastroesophageal reflux disease)    Hyperlipidemia    Insomnia    Lower back pain    Osteoarthritis    Ovarian cancer ([place]) 1995   RLS (restless legs syndrome)    Sedative, hypnotic or anxiolytic dependence with withdrawal, 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[place])    Wears dentures    full upper and lower    Past Surgical History:  Procedure Laterality Date   ABDOMINAL HYSTERECTOMY     APPENDECTOMY     BREAST BIOPSY Left 03/12/2015   fat necrosis   CESAREAN SECTION     COLONOSCOPY WITH PROPOFOL N/A 05/30/2018   Procedure:  COLONOSCOPY WITH PROPOFOL;  Surgeon: Manya Silvas, MD;  Location: [facility] ENDOSCOPY;  Service: Endoscopy;  Laterality: N/A;   EXCISION MORTON'S NEUROMA Left 10/07/2016   Procedure: EXCISION MORTON'S NEUROMA;  Surgeon: [name], DPM;  Location: [facility];  Service: Podiatry;  Laterality: Left;  IV WITH LOCAL   LIVER BIOPSY  2005   PerPt: Done to eval elevated LFTs and biopsy provided no definite dx/cause of elevated LFTs   MULTIPLE TOOTH EXTRACTIONS     OOPHORECTOMY     OSTECTOMY Right 04/10/2020   Procedure: DOUBLE OSTEOTOMY GENERAL W/ LOCAL;  Surgeon: [name], DPM;  Location: [facility];  Service: Podiatry;  Laterality: Right;    Family History  Adopted: Yes  Problem Relation Age of Onset   COPD Mother    Depression Mother    Colon cancer Mother        unsure of age   87 Father        Lung CA, Skin Cancer--?type   COPD Father    Stroke Father    Cancer Sister 34       cervical cancer   COPD Brother    Alcohol abuse Brother    Stroke Brother    COPD Maternal Aunt    Depression Daughter    Schizophrenia Son    Breast cancer Neg Hx  Social History   Socioeconomic History   Marital status: Widowed    Spouse name: Not on file   Number of children: Not on file   Years of education: Not on file   Highest education level: Not on file  Occupational History   Not on file  Tobacco Use   Smoking status: Former    Packs/day: 1.00    Years: 30.00    Total pack years: 30.00    Types: Cigarettes    Quit date: 04/08/2009    Years since quitting: 13.2   Smokeless tobacco: Never  Vaping Use   Vaping Use: Never used  Substance and Sexual Activity   Alcohol use: Not Currently    Alcohol/week: 3.0 - 7.0 standard drinks of alcohol    Types: 3 - 7 Shots of liquor per week    Comment: occasionally   Drug use: Not Currently    Types: Marijuana    Comment: told to refrain until after surgery   Sexual activity: Yes    Birth control/protection:  Surgical  Other Topics Concern   Not on file  Social History Narrative   Not on file   Social Determinants of Health   Financial Resource Strain: Low Risk  (03/13/2021)   Overall Financial Resource Strain (CARDIA)    Difficulty of Paying Living Expenses: Not very hard  Food Insecurity: Not on file  Transportation Needs: Not on file  Physical Activity: Not on file  Stress: Not on file  Social Connections: Not on file  Intimate Partner Violence: Not on file    Outpatient Medications Prior to Visit  Medication Sig Dispense Refill   ammonium lactate (AMLACTIN) 12 % cream APPLY TOPICALLY AS NEEDED FOR DRY SKIN 385 g 0   buPROPion (WELLBUTRIN XL) 150 MG 24 hr tablet Take 1 tablet (150 mg total) by mouth daily. 90 tablet 2   clonazePAM (KLONOPIN) 0.5 MG tablet TAKE 1 TABLET 2 TO 3 TIMES A WEEK AS DIRECTED FOR SEVERE PANIC ATTACKS ONLY 13 tablet 1   divalproex (DEPAKOTE ER) 500 MG 24 hr tablet Take 2 tablets (1,000 mg total) by mouth daily. Take along with 250 mg daily - total of 1250 mg 180 tablet 0   esomeprazole (NEXIUM) 20 MG capsule Take 1 capsule (20 mg total) by mouth daily as needed (take on empty stomach). 90 capsule 1   fluticasone (FLONASE) 50 MCG/ACT nasal spray Place 2 sprays into both nostrils daily. 16 mL 2   hydrOXYzine (ATARAX) 50 MG tablet TAKE 1 TABLET TWICE DAILY FOR SEVERE ANXIETY ATTACKS AS NEEDED 180 tablet 0   methocarbamol (ROBAXIN) 500 MG tablet TAKE 1 TABLET EVERY DAY 60 tablet 1   traZODone (DESYREL) 100 MG tablet TAKE 2 TABLETS AT BEDTIME AS NEEDED FOR SLEEP (Patient taking differently: Take 100 mg by mouth at bedtime as needed.) 180 tablet 0   ziprasidone (GEODON) 20 MG capsule Take 1 capsule (20 mg total) by mouth daily with breakfast. 90 capsule 0   alendronate (FOSAMAX) 70 MG tablet TAKE 1 TABLET EVERY WEEK WITH A FULL GLASS OF WATER ON AN EMPTY STOMACH. 12 tablet 2   budesonide-formoterol (SYMBICORT) 160-4.5 MCG/ACT inhaler Inhale 2 puffs into the lungs 2  (two) times daily. 3 each 3   hydrochlorothiazide (HYDRODIURIL) 12.5 MG tablet      meloxicam (MOBIC) 15 MG tablet Take 1 tablet (15 mg total) by mouth daily. 30 tablet 0   rosuvastatin (CRESTOR) 10 MG tablet TAKE 1 TABLET EVERY DAY 90 tablet 3  divalproex (DEPAKOTE ER) 250 MG 24 hr tablet Take 1 tablet (250 mg total) by mouth daily. Take along with 1000 mg daily- total of 1250 mg daily 90 tablet 0   No facility-administered medications prior to visit.    Allergies  Allergen Reactions   Benadryl [Diphenhydramine Hcl]     Hives/ rapid heartrate   Codeine     dizziness   Vicodin [Hydrocodone-Acetaminophen] Other (See Comments)    dizziness    ROS Review of Systems  Constitutional:  Negative for fatigue and fever.  Respiratory:  Negative for chest tightness and shortness of breath.   Gastrointestinal:  Negative for nausea and vomiting.  Skin:  Negative for rash and wound.  Psychiatric/Behavioral:  Negative for self-injury and suicidal ideas.       Objective:    Physical Exam HENT:     Head: Normocephalic.  Cardiovascular:     Rate and Rhythm: Normal rate and regular rhythm.     Pulses: Normal pulses.     Heart sounds: Normal heart sounds.  Pulmonary:     Effort: Pulmonary effort is normal.     Breath sounds: Normal breath sounds.  Neurological:     Mental Status: She is alert.     BP 115/73   Pulse 67   Ht $R'5\' 4"'tn$  (1.626 m)   Wt 164 lb (74.4 kg)   SpO2 98%   BMI 28.15 kg/m  Wt Readings from Last 3 Encounters:  06/26/22 164 lb (74.4 kg)  03/26/22 179 lb 12.8 oz (81.6 kg)  11/12/21 177 lb 12.8 oz (80.6 kg)    Lab Results  Component Value Date   TSH 2.600 03/30/2022   Lab Results  Component Value Date   WBC 5.7 03/30/2022   HGB 12.3 03/30/2022   HCT 36.9 03/30/2022   MCV 91 03/30/2022   PLT 158 03/30/2022   Lab Results  Component Value Date   NA 143 03/30/2022   K 4.5 03/30/2022   CO2 25 03/30/2022   GLUCOSE 90 03/30/2022   BUN 13 03/30/2022    CREATININE 0.68 03/30/2022   BILITOT 0.3 03/30/2022   ALKPHOS 73 03/30/2022   AST 13 03/30/2022   ALT 13 03/30/2022   PROT 6.7 03/30/2022   ALBUMIN 4.5 03/30/2022   CALCIUM 9.6 03/30/2022   EGFR 102 03/30/2022   Lab Results  Component Value Date   CHOL 121 03/30/2022   Lab Results  Component Value Date   HDL 42 03/30/2022   Lab Results  Component Value Date   LDLCALC 56 03/30/2022   Lab Results  Component Value Date   TRIG 131 03/30/2022   Lab Results  Component Value Date   CHOLHDL 2.9 03/30/2022   Lab Results  Component Value Date   HGBA1C 5.5 03/30/2022      Assessment & Plan:   Problem List Items Addressed This Visit       Other   Hyperlipidemia - Primary    Refilled Crestor 10 mg Pending labs       Relevant Medications   rosuvastatin (CRESTOR) 10 MG tablet   hydrochlorothiazide (HYDRODIURIL) 12.5 MG tablet   GAD (generalized anxiety disorder)    Following up with her psychiatrist  No complaints or concerns today       Chronic pain    Refilled meloxicam      Relevant Medications   meloxicam (MOBIC) 15 MG tablet   Other Visit Diagnoses     Immunization due       Relevant Orders  Flu Vaccine QUAD 6+ mos PF IM (Fluarix Quad PF) (Completed)   Dyslipidemia       Relevant Medications   rosuvastatin (CRESTOR) 10 MG tablet   Essential hypertension       Relevant Medications   rosuvastatin (CRESTOR) 10 MG tablet   hydrochlorothiazide (HYDRODIURIL) 12.5 MG tablet       Meds ordered this encounter  Medications   meloxicam (MOBIC) 15 MG tablet    Sig: Take 1 tablet (15 mg total) by mouth daily.    Dispense:  90 tablet    Refill:  1   rosuvastatin (CRESTOR) 10 MG tablet    Sig: Take 1 tablet (10 mg total) by mouth daily.    Dispense:  90 tablet    Refill:  1   hydrochlorothiazide (HYDRODIURIL) 12.5 MG tablet    Sig: Take 1 tablet (12.5 mg total) by mouth daily.    Dispense:  90 tablet    Refill:  1    Follow-up: Return in about 1  month (around 07/26/2022) for CPE.    Alvira Monday, FNP

## 2022-06-26 NOTE — Patient Instructions (Signed)
I appreciate the opportunity to provide care to you today!    Follow up:  1 months CPE  Labs:  in 1 month  Please pick up your refills at the pharmacy    Please continue to a heart-healthy diet and increase your physical activities. Try to exercise for 30mns at least three times a week.      It was a pleasure to see you and I look forward to continuing to work together on your health and well-being. Please do not hesitate to call the office if you need care or have questions about your care.   Have a wonderful day and week. With Gratitude, GAlvira MondayMSN, FNP-BC

## 2022-06-29 NOTE — Assessment & Plan Note (Addendum)
Following up with her psychiatrist  No complaints or concerns today

## 2022-06-29 NOTE — Assessment & Plan Note (Signed)
Refilled meloxicam

## 2022-06-29 NOTE — Assessment & Plan Note (Signed)
Refilled Crestor 10 mg Pending labs

## 2022-07-02 ENCOUNTER — Other Ambulatory Visit: Payer: Self-pay | Admitting: Psychiatry

## 2022-07-02 DIAGNOSIS — F3162 Bipolar disorder, current episode mixed, moderate: Secondary | ICD-10-CM

## 2022-07-09 ENCOUNTER — Other Ambulatory Visit: Payer: Self-pay | Admitting: Family Medicine

## 2022-07-09 DIAGNOSIS — G8929 Other chronic pain: Secondary | ICD-10-CM

## 2022-07-21 ENCOUNTER — Ambulatory Visit (INDEPENDENT_AMBULATORY_CARE_PROVIDER_SITE_OTHER): Payer: Medicare HMO | Admitting: Clinical

## 2022-07-21 DIAGNOSIS — F5105 Insomnia due to other mental disorder: Secondary | ICD-10-CM | POA: Diagnosis not present

## 2022-07-21 DIAGNOSIS — F3131 Bipolar disorder, current episode depressed, mild: Secondary | ICD-10-CM | POA: Diagnosis not present

## 2022-07-21 DIAGNOSIS — R41840 Attention and concentration deficit: Secondary | ICD-10-CM | POA: Diagnosis not present

## 2022-07-21 DIAGNOSIS — F411 Generalized anxiety disorder: Secondary | ICD-10-CM

## 2022-07-21 NOTE — Progress Notes (Signed)
Virtual Visit via Telephone Note   I connected with Stacy Chambers on 07/21/22 at  1:00 PM EDT by telephone and verified that I am speaking with the correct person using two identifiers.   Location: Patient: Home Provider: Office   I discussed the limitations, risks, security and privacy concerns of performing an evaluation and management service by telephone and the availability of in person appointments. I also discussed with the patient that there may be a patient responsible charge related to this service. The patient expressed understanding and agreed to proceed.   THERAPIST PROGRESS NOTE   Session Time: 1:00 PM-1:30 PM   Participation Level: Active   Behavioral Response: CasualAlertDepressed   Type of Therapy: Individual Therapy   Treatment Goals addressed: Coping   Interventions: CBT   Summary: Stacy Chambers. Burgard is a 56 y.o. female who presents with Bipolar Disorder./ GAD/Insomnia. The OPT therapist worked with the patient for her ongoing OPT treatment session. The OPT therapist utilized Motivational Interviewing to assist in creating therapeutic repore. The patient in the session was engaged and work in collaboration giving feedback about her triggers and symptoms over the past few weeks.The patient spoke about transitional adjustment to having new tenants living with her to help her in subsidizing the cost of living at her home.The patient spoke about being more active with the change of the season and cooler weather.The OPT therapist utilized Cognitive Behavioral Therapy through cognitive restructuring as well as worked with the patient on coping strategies to assist in management of mood. The OPT therapist worked with the patient on continuing to maintain her basic needs of self care with eating, hygiene, and sleep.  The patient spoke about feeling better over the past few days the patient spoke about now using [place] [employer] dating app.  Suicidal/Homicidal: Nowithout  intent/plan   Therapist Response: The OPT therapist worked with the patient for the patients scheduled session. The patient was engaged in her session and gave feedback in relation to triggers, symptoms, and behavior responses over the past few weeks. The patient spoke about a large change of having new tenants in the home post recently having to kick out her brother and his girlfriend after jewelry in the home came up missing.The OPT therapist worked with the patient utilizing an in session Cognitive Behavioral Therapy exercise. The patient was responsive in the session and verbalized," I have a younger couple living with me know and it helps with what they pay". The OPT therapist worked with the patient on staying active , managing her basic health needs, and being consistent with her medication therapy plan. The OPT therapist worked with the patient gauging activity level, coping skill implementation, and mood.The patient spoke about upcoming annual physical and med therapy appointment with Dr. Shea Evans.The OPT therapist will continue treatment work with the patient in her next scheduled session    Plan: Return again in 3 weeks.   Diagnosis:      Axis I: Bipolar 1 disorder,depressed mild/GAD/Insomnia/ Attention deficit.                             Axis II: No diagnosis     Collaboration of Care: collaboration of care reviewed with medication therapy involvement with psychiatrist Dr. Shea Evans.   Patient/Guardian was advised Release of Information must be obtained prior to any record release in order to collaborate their care with an outside provider. Patient/Guardian was advised if they have not already  done so to contact the registration department to sign all necessary forms in order for Stacy Chambers to release information regarding their care.    Consent: Patient/Guardian gives verbal consent for treatment and assignment of benefits for services provided during this visit. Patient/Guardian expressed  understanding and agreed to proceed.        I discussed the assessment and treatment plan with the patient. The patient was provided an opportunity to ask questions and all were answered. The patient agreed with the plan and demonstrated an understanding of the instructions.   The patient was advised to call back or seek an in-person evaluation if the symptoms worsen or if the condition fails to improve as anticipated.   I provided 30 minutes of non-face-to-face time during this encounter.   [name], LCSW   07/21/22

## 2022-07-29 ENCOUNTER — Ambulatory Visit (INDEPENDENT_AMBULATORY_CARE_PROVIDER_SITE_OTHER): Payer: Medicare HMO | Admitting: Family Medicine

## 2022-07-29 ENCOUNTER — Encounter: Payer: Self-pay | Admitting: Family Medicine

## 2022-07-29 VITALS — BP 108/66 | HR 65 | Ht 64.0 in | Wt 164.1 lb

## 2022-07-29 DIAGNOSIS — E782 Mixed hyperlipidemia: Secondary | ICD-10-CM

## 2022-07-29 DIAGNOSIS — E785 Hyperlipidemia, unspecified: Secondary | ICD-10-CM | POA: Diagnosis not present

## 2022-07-29 DIAGNOSIS — R21 Rash and other nonspecific skin eruption: Secondary | ICD-10-CM | POA: Insufficient documentation

## 2022-07-29 DIAGNOSIS — Z0001 Encounter for general adult medical examination with abnormal findings: Secondary | ICD-10-CM | POA: Diagnosis not present

## 2022-07-29 MED ORDER — TRIAMCINOLONE ACETONIDE 0.1 % EX CREA: 1.0000 | TOPICAL_CREAM | Freq: Two times a day (BID) | CUTANEOUS | 0 refills | Status: DC

## 2022-07-29 MED ORDER — ROSUVASTATIN CALCIUM 10 MG PO TABS: 10.0000 mg | ORAL_TABLET | Freq: Every day | ORAL | 1 refills | Status: DC

## 2022-07-29 NOTE — Assessment & Plan Note (Signed)
Refilled with rosuvastatin 10 mg Encouraged to continue treatment regiment

## 2022-07-29 NOTE — Patient Instructions (Signed)
I appreciate the opportunity to provide care to you today!    Follow up:  3 months  Labs: next visit   Your medications have been sent to your pharmacy   Please continue to a heart-healthy diet and increase your physical activities. Try to exercise for 67mns at least three times a week.      It was a pleasure to see you and I look forward to continuing to work together on your health and well-being. Please do not hesitate to call the office if you need care or have questions about your care.   Have a wonderful day and week. With Gratitude, GAlvira MondayMSN, FNP-BC '

## 2022-07-29 NOTE — Assessment & Plan Note (Signed)
Physical exam as documented Counseling is done on healthy lifestyle involving commitment to 150 minutes of exercise per week,  Discussed heart-healthy diet and attaining a healthy weight Changes in health habits are decided on by the patient with goals and time frames set for achieving them. Immunization and cancer screening needs are specifically addressed at this visit     

## 2022-07-29 NOTE — Assessment & Plan Note (Signed)
Will treat patient with Kenalog cream 0.1% to help decrease pruritus

## 2022-07-29 NOTE — Progress Notes (Signed)
Complete physical exam  Patient: Stacy Chambers   DOB: [birth year]   56 y.o. Female  MRN: [id number]  Subjective:    Chief Complaint  Patient presents with   Annual Exam    Here for CPE, no health concerns.    Rash    Pt c/o breakout on her back since 07/08/22.     Stacy Chambers is a 56 y.o. female who presents today for a complete physical exam. She reports consuming a general diet. The patient does not participate in regular exercise at present. She reports that she has  been doing a lot of yeard work to stay active. She generally feels well. She reports sleeping well. She does  have additional problems to discuss today.   Rash on back: She complains of increased pruritus of her back.  She noted small papules with intense pruritus.  She reports that she has not yet used any treatment.  Onset of symptoms was a week ago.   GAD 7- 8 Most recent fall risk assessment:    07/29/2022    8:17 AM  Fall Risk   Falls in the past year? 0  Number falls in past yr: 0  Injury with Fall? 0  Risk for fall due to : No Fall Risks  Follow up Falls evaluation completed     Most recent depression screenings:    07/29/2022    8:26 AM 07/29/2022    8:17 AM  PHQ 2/9 Scores  PHQ - 2 Score 2 0  PHQ- 9 Score 9     Dental: No current dental problems and Receives regular dental care  Patient Active Problem List   Diagnosis Date Noted   Rash of back 07/29/2022   Encounter for general adult medical examination with abnormal findings 07/29/2022   Osteopenia 03/26/2022   Chronic pain 03/26/2022   Bipolar disorder, in full remission, most recent episode depressed ([place]) 03/24/2022   MCI (mild cognitive impairment) 08/19/2021   Weight gain 08/19/2021   Bereavement 06/04/2021   B12 deficiency 04/17/2021   Bipolar 1 disorder, depressed, mild ([place]) 02/25/2021   High risk medication use 12/27/2020   Bipolar disorder, in partial remission, most recent episode mixed ([place]) 12/16/2020   Noncompliance  with treatment plan 12/16/2020   Attention and concentration deficit 11/25/2020   Bipolar 1 disorder, mixed, mild ([place]) 07/12/2020   Bipolar 1 disorder, mixed, full remission ([place]) 03/04/2020   Bipolar 1 disorder, mixed, moderate ([place]) 06/07/2019   Memory loss 06/07/2019   At risk for prolonged QT interval syndrome 10/20/2018   HLD (hyperlipidemia) 10/03/2015   GAD (generalized anxiety disorder) 02/21/2015   Insomnia due to mental disorder 02/21/2015   Bipolar disorder ([place]) 03/08/2013   Hyperlipidemia    Restless legs syndrome (RLS)    Past Medical History:  Diagnosis Date   Abnormal blood chemistry 10/03/2015   Anginal pain (HCC)    Anxiety    Anxiety, generalized 02/21/2015   Bipolar affective disorder ([place]) 03/08/2013   Overview:  Last Assessment & Plan:  Continue meds, f/u with psych Restart benzo at 1 po BID prn,     Bipolar disorder ([place])    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   Chronic obstructive pulmonary disease ([place]) 10/03/2015   COPD (chronic obstructive pulmonary disease) (HCC)    no inhalers   Elevated liver function tests 11/26/2004   H/O NEGATIVE LIVER BIOPSY, workup negative   Gallstones 02/21/2015   GERD (gastroesophageal reflux disease)    Hyperlipidemia  Insomnia    Lower back pain    Osteoarthritis    Ovarian cancer ([place]) 1995   RLS (restless legs syndrome)    Sedative, hypnotic or anxiolytic dependence with withdrawal, 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[place])    Wears dentures    full upper and lower   Past Surgical History:  Procedure Laterality Date   ABDOMINAL HYSTERECTOMY     APPENDECTOMY     BREAST BIOPSY Left 03/12/2015   fat necrosis   CESAREAN SECTION     COLONOSCOPY WITH PROPOFOL N/A 05/30/2018   Procedure: COLONOSCOPY WITH PROPOFOL;  Surgeon: Manya Silvas, MD;  [facility] ENDOSCOPY;  Service: Endoscopy;  Laterality: N/A;   EXCISION MORTON'S NEUROMA Left 10/07/2016   Procedure: EXCISION MORTON'S NEUROMA;  Surgeon: [name], DPM;  Location: [facility];  Service: Podiatry;  Laterality: Left;  IV WITH LOCAL   LIVER BIOPSY  2005   PerPt: Done to eval elevated LFTs and biopsy provided no definite dx/cause of elevated LFTs   MULTIPLE TOOTH EXTRACTIONS     OOPHORECTOMY     OSTECTOMY Right 04/10/2020   Procedure: DOUBLE OSTEOTOMY GENERAL W/ LOCAL;  Surgeon: [name], DPM;  Location: [facility];  Service: Podiatry;  Laterality: Right;   Social History   Tobacco Use   Smoking status: Former    Packs/day: 1.00    Years: 30.00    Total pack years: 30.00    Types: Cigarettes    Quit date: 04/08/2009    Years since quitting: 13.3   Smokeless tobacco: Never  Vaping Use   Vaping Use: Never used  Substance Use Topics   Alcohol use: Not Currently    Alcohol/week: 3.0 - 7.0 standard drinks of alcohol    Types: 3 - 7 Shots of liquor per week    Comment: occasionally   Drug use: Not Currently    Types: Marijuana    Comment: told to refrain until after surgery   Social History   Socioeconomic History   Marital status: Widowed    Spouse name: Not on file   Number of children: Not on file   Years of education: Not on file   Highest education level: Not on file  Occupational History   Not on file  Tobacco Use   Smoking status: Former    Packs/day: 1.00    Years: 30.00    Total pack years: 30.00    Types: Cigarettes    Quit date: 04/08/2009    Years since quitting: 13.3   Smokeless tobacco: Never  Vaping Use   Vaping Use: Never used  Substance and Sexual Activity   Alcohol use: Not Currently    Alcohol/week: 3.0 - 7.0 standard drinks of alcohol    Types: 3 - 7 Shots of liquor per week    Comment: occasionally   Drug use: Not Currently    Types: Marijuana    Comment: told to refrain until after surgery   Sexual activity: Yes    Birth control/protection: Surgical  Other Topics Concern   Not on file  Social History Narrative   Not on file   Social Determinants of Health   Financial Resource Strain: Low Risk   (03/13/2021)   Overall Financial Resource Strain (CARDIA)    Difficulty of Paying Living Expenses: Not very hard  Food Insecurity: Not on file  Transportation Needs: Not on file  Physical Activity: Not on file  Stress: Not on file  Social Connections: Not on file  Intimate Partner Violence: Not on  file   Family Status  Relation Name Status   Mother  Deceased   Father  Deceased   Sister  Deceased   Brother  Alive   Brother  Alive   Brother  Alive   Sister  Alive   Sister  Alive   Sister  Alive   [place]  (Not Specified)   Daughter  (Not Specified)   Son  (Not Specified)   Neg Hx  (Not Specified)   Family History  Adopted: Yes  Problem Relation Age of Onset   COPD Mother    Depression Mother    Colon cancer Mother        unsure of age   84 Father        Lung CA, Skin Cancer--?type   COPD Father    Stroke Father    Cancer Sister 77       cervical cancer   COPD Brother    Alcohol abuse Brother    Stroke Brother    COPD Maternal Aunt    Depression Daughter    Schizophrenia Son    Breast cancer Neg Hx    Allergies  Allergen Reactions   Benadryl [Diphenhydramine Hcl]     Hives/ rapid heartrate   Codeine     dizziness   Vicodin [Hydrocodone-Acetaminophen] Other (See Comments)    dizziness      Patient Care Team: Alvira Monday, FNP as PCP - General (Family Medicine) Edythe Clarity, [facility] as Pharmacist (Pharmacist)   Outpatient Medications Prior to Visit  Medication Sig   ammonium lactate (AMLACTIN) 12 % cream APPLY TOPICALLY AS NEEDED FOR DRY SKIN   buPROPion (WELLBUTRIN XL) 150 MG 24 hr tablet Take 1 tablet (150 mg total) by mouth daily.   clonazePAM (KLONOPIN) 0.5 MG tablet TAKE 1 TABLET 2 TO 3 TIMES A WEEK AS DIRECTED FOR SEVERE PANIC ATTACKS ONLY   divalproex (DEPAKOTE ER) 250 MG 24 hr tablet TAKE 1 TABLET EVERY DAY . TAKE ALONG WITH 1000 MG DAILY FOR TOTAL OF 1250 MG DAILY   divalproex (DEPAKOTE ER) 500 MG 24 hr tablet Take 2 tablets (1,000 mg  total) by mouth daily. Take along with 250 mg daily - total of 1250 mg   esomeprazole (NEXIUM) 20 MG capsule Take 1 capsule (20 mg total) by mouth daily as needed (take on empty stomach).   fluticasone (FLONASE) 50 MCG/ACT nasal spray Place 2 sprays into both nostrils daily.   hydrochlorothiazide (HYDRODIURIL) 12.5 MG tablet Take 1 tablet (12.5 mg total) by mouth daily.   hydrOXYzine (ATARAX) 50 MG tablet TAKE 1 TABLET TWICE DAILY FOR SEVERE ANXIETY ATTACKS AS NEEDED   meloxicam (MOBIC) 15 MG tablet Take 1 tablet (15 mg total) by mouth daily.   methocarbamol (ROBAXIN) 500 MG tablet TAKE 1 TABLET EVERY DAY   traZODone (DESYREL) 100 MG tablet TAKE 2 TABLETS AT BEDTIME AS NEEDED FOR SLEEP (Patient taking differently: Take 100 mg by mouth at bedtime as needed.)   ziprasidone (GEODON) 20 MG capsule Take 1 capsule (20 mg total) by mouth daily with breakfast.   [DISCONTINUED] rosuvastatin (CRESTOR) 10 MG tablet Take 1 tablet (10 mg total) by mouth daily.   No facility-administered medications prior to visit.    Review of Systems  Constitutional:  Negative for chills and fever.  HENT:  Negative for sinus pain and sore throat.   Eyes:  Negative for pain and discharge.  Respiratory:  Negative for cough, shortness of breath and wheezing.   Cardiovascular:  Negative for chest pain and claudication.  Gastrointestinal:  Negative for abdominal pain, nausea and vomiting.  Genitourinary:  Negative for frequency and urgency.  Musculoskeletal:  Negative for neck pain.  Skin:  Positive for rash.  Neurological:  Negative for dizziness and headaches.  Endo/Heme/Allergies:  Negative for polydipsia.  Psychiatric/Behavioral:  Negative for hallucinations and memory loss.           Objective:     BP 108/66   Pulse 65   Ht '5\' 4"'  (1.626 m)   Wt 164 lb 1.9 oz (74.4 kg)   SpO2 97%   BMI 28.17 kg/m  BP Readings from Last 3 Encounters:  07/29/22 108/66  06/26/22 115/73  03/26/22 118/82   Wt Readings  from Last 3 Encounters:  07/29/22 164 lb 1.9 oz (74.4 kg)  06/26/22 164 lb (74.4 kg)  03/26/22 179 lb 12.8 oz (81.6 kg)      Physical Exam HENT:     Head: Normocephalic.     Right Ear: External ear normal.     Left Ear: External ear normal.     Nose: No congestion.     Mouth/Throat:     Mouth: Mucous membranes are moist.  Eyes:     Extraocular Movements: Extraocular movements intact.     Pupils: Pupils are equal, round, and reactive to light.  Cardiovascular:     Rate and Rhythm: Normal rate and regular rhythm.     Pulses: Normal pulses.     Heart sounds: Normal heart sounds.  Pulmonary:     Effort: Pulmonary effort is normal.     Breath sounds: Normal breath sounds.  Abdominal:     Palpations: Abdomen is soft.     Tenderness: There is no right CVA tenderness or left CVA tenderness.  Musculoskeletal:     Cervical back: No rigidity or tenderness.     Right lower leg: No edema.     Left lower leg: No edema.  Skin:    Findings: Rash (Papules noted on the back) present.  Neurological:     Mental Status: She is alert and oriented to person, place, and time.      No results found for any visits on 07/29/22. Last CBC Lab Results  Component Value Date   WBC 5.7 03/30/2022   HGB 12.3 03/30/2022   HCT 36.9 03/30/2022   MCV 91 03/30/2022   MCH 30.2 03/30/2022   RDW 12.7 03/30/2022   PLT 158 50/53/9767   Last metabolic panel Lab Results  Component Value Date   GLUCOSE 90 03/30/2022   NA 143 03/30/2022   K 4.5 03/30/2022   CL 104 03/30/2022   CO2 25 03/30/2022   BUN 13 03/30/2022   CREATININE 0.68 03/30/2022   EGFR 102 03/30/2022   CALCIUM 9.6 03/30/2022   PROT 6.7 03/30/2022   ALBUMIN 4.5 03/30/2022   LABGLOB 2.2 03/30/2022   AGRATIO 2.0 03/30/2022   BILITOT 0.3 03/30/2022   ALKPHOS 73 03/30/2022   AST 13 03/30/2022   ALT 13 03/30/2022   Last lipids Lab Results  Component Value Date   CHOL 121 03/30/2022   HDL 42 03/30/2022   LDLCALC 56 03/30/2022    TRIG 131 03/30/2022   CHOLHDL 2.9 03/30/2022   Last hemoglobin A1c Lab Results  Component Value Date   HGBA1C 5.5 03/30/2022   Last thyroid functions Lab Results  Component Value Date   TSH 2.600 03/30/2022   Last vitamin D Lab Results  Component Value Date   VD25OH  37.4 03/30/2022   Last vitamin B12 and Folate Lab Results  Component Value Date   VITAMINB12 812 03/30/2022   FOLATE 13.0 03/30/2022        Assessment & Plan:    Routine Health Maintenance and Physical Exam  Immunization History  Administered Date(s) Administered   Influenza,inj,Quad PF,6+ Mos 06/26/2014, 06/14/2019, 09/16/2020, 06/26/2022   Influenza-Unspecified 09/14/2018   Tdap 11/22/2013    Health Maintenance  Topic Date Due   COVID-19 Vaccine (1) Never done   Zoster Vaccines- Shingrix (1 of 2) Never done   MAMMOGRAM  04/18/2023   COLONOSCOPY (Pts 45-33yr Insurance coverage will need to be confirmed)  05/31/2023   TETANUS/TDAP  11/23/2023   INFLUENZA VACCINE  Completed   Hepatitis C Screening  Completed   HIV Screening  Completed   HPV VACCINES  Aged Out    Discussed health benefits of physical activity, and encouraged her to engage in regular exercise appropriate for her age and condition.  Problem List Items Addressed This Visit       Musculoskeletal and Integument   Rash of back - Primary    Will treat patient with Kenalog cream 0.1% to help decrease pruritus      Relevant Medications   triamcinolone cream (KENALOG) 0.1 %     Other   Hyperlipidemia    Refilled with rosuvastatin 10 mg Encouraged to continue treatment regiment      Relevant Medications   rosuvastatin (CRESTOR) 10 MG tablet   Encounter for general adult medical examination with abnormal findings    Physical exam as documented Counseling is done on healthy lifestyle involving commitment to 150 minutes of exercise per week,  Discussed heart-healthy diet and attaining a healthy weight Changes in health habits  are decided on by the patient with goals and time frames set for achieving them. Immunization and cancer screening needs are specifically addressed at this visit           Other Visit Diagnoses     Dyslipidemia       Relevant Medications   rosuvastatin (CRESTOR) 10 MG tablet      Return in about 3 months (around 10/29/2022).     GAlvira Monday FNP

## 2022-08-04 ENCOUNTER — Ambulatory Visit (INDEPENDENT_AMBULATORY_CARE_PROVIDER_SITE_OTHER): Payer: Medicare HMO | Admitting: Clinical

## 2022-08-04 DIAGNOSIS — F5105 Insomnia due to other mental disorder: Secondary | ICD-10-CM

## 2022-08-04 DIAGNOSIS — R41840 Attention and concentration deficit: Secondary | ICD-10-CM | POA: Diagnosis not present

## 2022-08-04 DIAGNOSIS — F411 Generalized anxiety disorder: Secondary | ICD-10-CM

## 2022-08-04 DIAGNOSIS — F3131 Bipolar disorder, current episode depressed, mild: Secondary | ICD-10-CM | POA: Diagnosis not present

## 2022-08-04 NOTE — Progress Notes (Signed)
Virtual Visit via Telephone Note   I connected with Stacy Chambers on 08/04/22 at  1:00 PM EDT by telephone and verified that I am speaking with the correct person using two identifiers.   Location: Patient: Home Provider: Office   I discussed the limitations, risks, security and privacy concerns of performing an evaluation and management service by telephone and the availability of in person appointments. I also discussed with the patient that there may be a patient responsible charge related to this service. The patient expressed understanding and agreed to proceed.   THERAPIST PROGRESS NOTE   Session Time: 1:00 PM-1:30 PM   Participation Level: Active   Behavioral Response: CasualAlertDepressed   Type of Therapy: Individual Therapy   Treatment Goals addressed: Coping   Interventions: CBT   Summary: Stacy Chambers is a 56 y.o. female who presents with Bipolar Disorder./ GAD/Insomnia. The OPT therapist worked with the patient for her ongoing OPT treatment session. The OPT therapist utilized Motivational Interviewing to assist in creating therapeutic repore. The patient in the session was engaged and work in collaboration giving feedback about her triggers and symptoms over the past few weeks.The patient spoke about ongoing transitional adjustment to having new tenants living with her to help her in subsidizing the cost of living at her home and verbalized this is continuing to go well.The patient spoke about being more active with the change of the season and cooler weather and enjoying getting her home decorated for the Fall/Halloween.The OPT therapist utilized Cognitive Behavioral Therapy through cognitive restructuring as well as worked with the patient on coping strategies to assist in management of mood. The OPT therapist worked with the patient on continuing to maintain her basic needs of self care with eating, hygiene, and sleep.  The patient spoke about continuing her use of  dating sites.  Suicidal/Homicidal: Nowithout intent/plan   Therapist Response: The OPT therapist worked with the patient for the patients scheduled session. The patient was engaged in her session and gave feedback in relation to triggers, symptoms, and behavior responses over the past few weeks. The patient spoke about ongoing adjustment of having new tenants in the home .The OPT therapist worked with the patient utilizing an in session Cognitive Behavioral Therapy exercise. The patient was responsive in the session and verbalized," I am ready for the upcoming holidays and the day after Halloween I am putting up all my Christmas Decorations".. The OPT therapist worked with the patient on staying active , managing her basic health needs, and being consistent with her medication therapy plan. The OPT therapist worked with the patient gauging activity level, coping skill implementation, and mood.The OPT therapist will continue treatment work with the patient in her next scheduled session    Plan: Return again in 3 weeks.   Diagnosis:      Axis I: Bipolar 1 disorder,depressed mild/GAD/Insomnia/ Attention deficit.                             Axis II: No diagnosis     Collaboration of Care: collaboration of care reviewed with medication therapy involvement with psychiatrist Dr. Shea Evans.   Patient/Guardian was advised Release of Information must be obtained prior to any record release in order to collaborate their care with an outside provider. Patient/Guardian was advised if they have not already done so to contact the registration department to sign all necessary forms in order for [name] to release information regarding their care.  Consent: Patient/Guardian gives verbal consent for treatment and assignment of benefits for services provided during this visit. Patient/Guardian expressed understanding and agreed to proceed.        I discussed the assessment and treatment plan with the patient. The  patient was provided an opportunity to ask questions and all were answered. The patient agreed with the plan and demonstrated an understanding of the instructions.   The patient was advised to call back or seek an in-person evaluation if the symptoms worsen or if the condition fails to improve as anticipated.   I provided 30 minutes of non-face-to-face time during this encounter.   [name], LCSW  08/04/2022

## 2022-08-12 ENCOUNTER — Other Ambulatory Visit: Payer: Self-pay | Admitting: Psychiatry

## 2022-08-12 DIAGNOSIS — F411 Generalized anxiety disorder: Secondary | ICD-10-CM

## 2022-08-14 ENCOUNTER — Other Ambulatory Visit: Payer: Self-pay | Admitting: Psychiatry

## 2022-08-14 DIAGNOSIS — Z634 Disappearance and death of family member: Secondary | ICD-10-CM

## 2022-08-17 ENCOUNTER — Other Ambulatory Visit: Payer: Self-pay | Admitting: Psychiatry

## 2022-08-21 NOTE — Addendum Note (Signed)
Addended byAlvira Monday on: 08/21/2022 05:39 PM   Modules accepted: Level of Service

## 2022-08-24 ENCOUNTER — Telehealth (INDEPENDENT_AMBULATORY_CARE_PROVIDER_SITE_OTHER): Payer: Medicare HMO | Admitting: Psychiatry

## 2022-08-24 ENCOUNTER — Encounter: Payer: Self-pay | Admitting: Psychiatry

## 2022-08-24 DIAGNOSIS — Z634 Disappearance and death of family member: Secondary | ICD-10-CM | POA: Diagnosis not present

## 2022-08-24 DIAGNOSIS — F3178 Bipolar disorder, in full remission, most recent episode mixed: Secondary | ICD-10-CM

## 2022-08-24 DIAGNOSIS — Z79899 Other long term (current) drug therapy: Secondary | ICD-10-CM | POA: Diagnosis not present

## 2022-08-24 DIAGNOSIS — G2581 Restless legs syndrome: Secondary | ICD-10-CM

## 2022-08-24 DIAGNOSIS — F5105 Insomnia due to other mental disorder: Secondary | ICD-10-CM

## 2022-08-24 DIAGNOSIS — F3176 Bipolar disorder, in full remission, most recent episode depressed: Secondary | ICD-10-CM

## 2022-08-24 DIAGNOSIS — G3184 Mild cognitive impairment, so stated: Secondary | ICD-10-CM | POA: Diagnosis not present

## 2022-08-24 DIAGNOSIS — F411 Generalized anxiety disorder: Secondary | ICD-10-CM | POA: Diagnosis not present

## 2022-08-24 NOTE — Progress Notes (Unsigned)
Virtual Visit via Video Note  I connected with Stacy Chambers on 08/24/22 at  2:30 PM EDT by a video enabled telemedicine application and verified that I am speaking with the correct person using two identifiers.  Location Provider Location : [facility] Patient Location : Home  Participants: Patient , Provider    I discussed the limitations of evaluation and management by telemedicine and the availability of in person appointments. The patient expressed understanding and agreed to proceed.    I discussed the assessment and treatment plan with the patient. The patient was provided an opportunity to ask questions and all were answered. The patient agreed with the plan and demonstrated an understanding of the instructions.   The patient was advised to call back or seek an in-person evaluation if the symptoms worsen or if the condition fails to improve as anticipated.   [facility] MD OP Progress Note  08/25/2022 12:48 PM HOLLYE PRITT  MRN:  [id number]  Chief Complaint:  Chief Complaint  Patient presents with   Follow-up   Anxiety   Depression   Medication Refill   HPI: Stacy Chambers is a 56 year old Caucasian female, lives in [place], has a history of bipolar disorder, GAD, insomnia, bereavement, RLS, MCI, memory loss, vitamin B12 deficiency was evaluated by telemedicine today.  Patient today reports she is currently dating someone.  She met him on a dating app.  She reports she currently has a purpose to wake up to.  She reports she kicked out her brother's girlfriend from the home and hence her brother also moved out.  That also has helped.  She reports she is currently doing better with regards to her mood.  Denies any depression or anxiety symptoms.  The grief is better.  It has been 1 year and 2 months since her husband passed.  Patient reports sleep continues to be a problem.  She reports when she takes the trazodone 200 mg she wakes up feeling groggy the next day.  She tried to  100 mg and that only helps her to sleep 3 to 4 hours.  Patient otherwise compliant on medications.  Patient reports she is willing to get her Depakote level repeated since the previous levels were subtherapeutic and at that time she had reported she was noncompliant on the Depakote.  Patient has been noncompliant on the Geodon.  Reports she does not want to stay on that medication.  She has stopped taking it.  Denies any suicidality, homicidality or perceptual disturbances.  Patient denies any other concerns today.  Visit Diagnosis:    ICD-10-CM   1. Bipolar disorder, in full remission, most recent episode mixed (HCC)  F31.78 Valproic acid level    2. GAD (generalized anxiety disorder)  F41.1 Valproic acid level    3. Insomnia due to mental disorder  F51.05    mood    4. Bereavement  Z63.4     5. Restless legs syndrome (RLS)  G25.81     6. MCI (mild cognitive impairment)  G31.84     7. High risk medication use  Z79.899 Valproic acid level      Past Psychiatric History: Reviewed past psychiatric history from progress note on 10/12/2018.  Past trials of medications like BuSpar, Zoloft, carbamazepine, Vraylar, Zyprexa, Lamictal, Lexapro, gabapentin, risperidone, Geodon.  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   Chronic obstructive pulmonary disease ([place]) 10/03/2015   COPD (chronic obstructive pulmonary disease) (HCC)    no inhalers   Elevated liver function tests 11/26/2004   H/O NEGATIVE LIVER BIOPSY, workup negative   Gallstones 02/21/2015   GERD (gastroesophageal reflux disease)    Hyperlipidemia    Insomnia    Lower back pain    Osteoarthritis     Ovarian cancer ([place]) 1995   RLS (restless legs syndrome)    Sedative, hypnotic or anxiolytic dependence with withdrawal, 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[place])    Wears dentures    full upper and lower    Past Surgical History:  Procedure Laterality Date   ABDOMINAL HYSTERECTOMY     APPENDECTOMY     BREAST BIOPSY Left 03/12/2015   fat necrosis   CESAREAN SECTION     COLONOSCOPY WITH PROPOFOL N/A 05/30/2018   Procedure: COLONOSCOPY WITH PROPOFOL;  Surgeon: Manya Silvas, MD;  Location: [facility] ENDOSCOPY;  Service: Endoscopy;  Laterality: N/A;   EXCISION MORTON'S NEUROMA Left 10/07/2016   Procedure: EXCISION MORTON'S NEUROMA;  Surgeon: [name], DPM;  Location: [facility];  Service: Podiatry;  Laterality: Left;  IV WITH LOCAL   LIVER BIOPSY  2005   PerPt: Done to eval elevated LFTs and biopsy provided no definite dx/cause of elevated LFTs   MULTIPLE TOOTH EXTRACTIONS     OOPHORECTOMY     OSTECTOMY Right 04/10/2020   Procedure: DOUBLE OSTEOTOMY GENERAL W/ LOCAL;  Surgeon: [name], DPM;  Location: [facility];  Service: Podiatry;  Laterality: Right;    Family Psychiatric History: Reviewed family psychiatric history from progress note on 10/12/2018.  Family History:  Family History  Adopted: Yes  Problem Relation Age of Onset   COPD Mother    Depression Mother    Colon cancer Mother        unsure of age   43 Father        Lung CA, Skin Cancer--?type   COPD Father    Stroke Father    Cancer Sister 67       cervical cancer   COPD Brother    Alcohol abuse Brother    Stroke Brother    COPD Maternal Aunt    Depression Daughter    Schizophrenia Son    Breast cancer Neg Hx     Social History: Reviewed social history from progress note on 10/12/2018. Social History   Socioeconomic History   Marital status: Widowed    Spouse name: Not on file   Number of children: Not on file   Years of education: Not on file   Highest education level: Not on file   Occupational History   Not on file  Tobacco Use   Smoking status: Former    Packs/day: 1.00    Years: 30.00    Total pack years: 30.00    Types: Cigarettes    Quit date: 04/08/2009    Years since quitting: 13.3   Smokeless tobacco: Never  Vaping Use   Vaping Use: Never used  Substance and Sexual Activity   Alcohol use: Not Currently    Alcohol/week: 3.0 - 7.0 standard drinks of alcohol    Types: 3 - 7 Shots of liquor per week    Comment: occasionally  Drug use: Not Currently    Types: Marijuana    Comment: told to refrain until after surgery   Sexual activity: Yes    Birth control/protection: Surgical  Other Topics Concern   Not on file  Social History Narrative   Not on file   Social Determinants of Health   Financial Resource Strain: Low Risk  (03/13/2021)   Overall Financial Resource Strain (CARDIA)    Difficulty of Paying Living Expenses: Not very hard  Food Insecurity: Not on file  Transportation Needs: Not on file  Physical Activity: Not on file  Stress: Not on file  Social Connections: Not on file    Allergies:  Allergies  Allergen Reactions   Benadryl [Diphenhydramine Hcl]     Hives/ rapid heartrate   Codeine     dizziness   Vicodin [Hydrocodone-Acetaminophen] Other (See Comments)    dizziness    Metabolic Disorder Labs: Lab Results  Component Value Date   HGBA1C 5.5 03/30/2022   MPG 114 09/14/2018   No results found for: "PROLACTIN" Lab Results  Component Value Date   CHOL 121 03/30/2022   TRIG 131 03/30/2022   HDL 42 03/30/2022   CHOLHDL 2.9 03/30/2022   VLDL 18 03/04/2015   LDLCALC 56 03/30/2022   LDLCALC 87 06/18/2020   Lab Results  Component Value Date   TSH 2.600 03/30/2022   TSH 1.51 12/19/2019    Therapeutic Level Labs: Lab Results  Component Value Date   LITHIUM 0.3 (L) 10/24/2018   LITHIUM 0.49 (L) 01/13/2009   Lab Results  Component Value Date   VALPROATE 29 (L) 06/02/2022   VALPROATE 73 10/08/2021   No results  found for: "CBMZ"  Current Medications: Current Outpatient Medications  Medication Sig Dispense Refill   alendronate (FOSAMAX) 70 MG tablet Take 70 mg by mouth once a week.     ammonium lactate (AMLACTIN) 12 % cream APPLY TOPICALLY AS NEEDED FOR DRY SKIN 385 g 0   buPROPion (WELLBUTRIN XL) 150 MG 24 hr tablet TAKE 1 TABLET EVERY DAY 90 tablet 3   clonazePAM (KLONOPIN) 0.5 MG tablet TAKE 1 TABLET 2 TO 3 TIMES A WEEK AS DIRECTED FOR SEVERE PANIC ATTACKS ONLY 13 tablet 1   divalproex (DEPAKOTE ER) 250 MG 24 hr tablet TAKE 1 TABLET EVERY DAY . TAKE ALONG WITH 1000 MG DAILY FOR TOTAL OF 1250 MG DAILY 90 tablet 0   divalproex (DEPAKOTE ER) 500 MG 24 hr tablet TAKE 2 TABLETS (1,000 MG TOTAL) BY MOUTH DAILY. TAKE ALONG WITH 250 MG DAILY - TOTAL OF 1250 MG 180 tablet 0   esomeprazole (NEXIUM) 20 MG capsule Take 1 capsule (20 mg total) by mouth daily as needed (take on empty stomach). 90 capsule 1   fluticasone (FLONASE) 50 MCG/ACT nasal spray Place 2 sprays into both nostrils daily. 16 mL 2   hydrochlorothiazide (HYDRODIURIL) 12.5 MG tablet Take 1 tablet (12.5 mg total) by mouth daily. 90 tablet 1   hydrOXYzine (ATARAX) 50 MG tablet TAKE 1 TABLET TWICE DAILY FOR SEVERE ANXIETY ATTACKS AS NEEDED 180 tablet 0   meloxicam (MOBIC) 15 MG tablet Take 1 tablet (15 mg total) by mouth daily. 90 tablet 1   methocarbamol (ROBAXIN) 500 MG tablet TAKE 1 TABLET EVERY DAY 60 tablet 1   rosuvastatin (CRESTOR) 10 MG tablet Take 1 tablet (10 mg total) by mouth daily. 90 tablet 1   traZODone (DESYREL) 100 MG tablet Take 1-2 tablets (100-200 mg total) by mouth at bedtime as needed for sleep.  60 tablet 0   triamcinolone cream (KENALOG) 0.1 % Apply 1 Application topically 2 (two) times daily. 30 g 0   No current facility-administered medications for this visit.     Musculoskeletal: Strength & Muscle Tone:  UTA Gait & Station:  Seated Patient leans: N/A  Psychiatric Specialty Exam: Review of Systems   Psychiatric/Behavioral: Negative.    All other systems reviewed and are negative.   There were no vitals taken for this visit.There is no height or weight on file to calculate BMI.  General Appearance: Casual  Eye Contact:  Fair  Speech:  Clear and Coherent  Volume:  Normal  Mood:  Euthymic  Affect:  Congruent  Thought Process:  Goal Directed and Descriptions of Associations: Intact  Orientation:  Full (Time, Place, and Person)  Thought Content: Logical   Suicidal Thoughts:  No  Homicidal Thoughts:  No  Memory:  Immediate;   Fair Recent;   Fair Remote;   Limited  Judgement:  Fair  Insight:  Fair  Psychomotor Activity:  Normal  Concentration:  Concentration: Fair and Attention Span: Fair  Recall:  [employer] of Knowledge: Fair  Language: Fair  Akathisia:  No  Handed:  Left  AIMS (if indicated): not done  Assets:  Communication Skills Desire for Improvement Housing Social Support  ADL's:  Intact  Cognition: WNL  Sleep:  Fair   Screenings: AIMS    Flowsheet Row Video Visit from 06/01/2022 in [facility] Video Visit from 03/24/2022 in [facility] Video Visit from 06/04/2021 in [facility] Visit from 01/28/2021 in [facility] Office Visit from 10/24/2018 in [facility] Total Score 0 0 0 0 [place] Office Visit from 07/29/2022 in [facility] from 03/03/2022 in [facility] Video Visit from 01/13/2022 in [facility] Video Visit from 06/25/2021 in [facility] from 03/10/2021 in [facility]  Total GAD-7 Score _0 PHQ2-9    Flowsheet Row Video Visit from 08/24/2022 in [facility] Office Visit  from 07/29/2022 in [facility] Visit from 06/26/2022 in [facility] Video Visit from 06/01/2022 in [facility] Video Visit from 04/23/2022 in [facility]  PHQ-2 Total Score 0 2 0 1 6  PHQ-9 Total Score 2 9 -- -- 10      Flowsheet Row Video Visit from 08/24/2022 in [facility] Video Visit from 06/01/2022 in [facility] Video Visit from 04/23/2022 in [facility] Low Risk Low Risk Low Risk        Assessment and Plan: EVALEEN SANT is a 56 year old Caucasian female, widowed, lives in [place], has a history of bipolar disorder, anxiety disorder, COPD, RLS, vitamin B12 deficiency was evaluated by telemedicine today.  Patient is currently improving, will benefit from the following plan.  Plan Bipolar disorder in remission Depakote ER 1250 mg p.o. daily Wellbutrin XL 150 mg p.o. daily Trazodone 100-200 mg p.o. nightly for sleep.  Patient advised to try of 150 mg of trazodone at bedtime as needed to see if that will help her better than the 100 mg.  Most recently she has been only using 100 mg as noted above.  GAD-stable Continue CBT with  Mr. Maye Hides as needed Klonopin 0.5 mg up to 2-3 times a week only for severe anxiety attacks.  Patient to limit use.  Bereavement-stable Continue CBT as needed  RLS-stable Will monitor closely  History of MCI-was referred for neurological consultation in the past.  High risk medication use-will order Depakote level.  Patient to go to [facility] lab.  Depakote level-06/02/2022-subtherapeutic at 29.  At that time patient had reported not being compliant on Depakote.  Currently she is compliant.  Follow-up in clinic in 3 months or sooner in person.        Consent: Patient/Guardian gives verbal consent for treatment and assignment of benefits for services provided  during this visit. Patient/Guardian expressed understanding and agreed to proceed.   This note was generated in part or whole with voice recognition software. Voice recognition is usually quite accurate but there are transcription errors that can and very often do occur. I apologize for any typographical errors that were not detected and corrected.      Ursula Alert, MD 08/25/2022, 12:48 PM

## 2022-08-31 ENCOUNTER — Ambulatory Visit (HOSPITAL_COMMUNITY): Payer: Medicare HMO | Admitting: Clinical

## 2022-09-26 ENCOUNTER — Other Ambulatory Visit: Payer: Self-pay | Admitting: Psychiatry

## 2022-09-26 ENCOUNTER — Other Ambulatory Visit: Payer: Self-pay | Admitting: Family Medicine

## 2022-09-26 DIAGNOSIS — F411 Generalized anxiety disorder: Secondary | ICD-10-CM

## 2022-09-26 DIAGNOSIS — G8929 Other chronic pain: Secondary | ICD-10-CM

## 2022-09-30 ENCOUNTER — Other Ambulatory Visit (HOSPITAL_COMMUNITY): Payer: Self-pay | Admitting: Psychiatry

## 2022-10-29 DIAGNOSIS — K006 Disturbances in tooth eruption: Secondary | ICD-10-CM | POA: Diagnosis not present

## 2022-10-30 ENCOUNTER — Ambulatory Visit (INDEPENDENT_AMBULATORY_CARE_PROVIDER_SITE_OTHER): Payer: Medicare HMO | Admitting: Family Medicine

## 2022-10-30 ENCOUNTER — Encounter: Payer: Self-pay | Admitting: Family Medicine

## 2022-10-30 VITALS — BP 112/80 | HR 74 | Ht 64.0 in | Wt 171.1 lb

## 2022-10-30 DIAGNOSIS — F3162 Bipolar disorder, current episode mixed, moderate: Secondary | ICD-10-CM | POA: Diagnosis not present

## 2022-10-30 DIAGNOSIS — R7301 Impaired fasting glucose: Secondary | ICD-10-CM | POA: Diagnosis not present

## 2022-10-30 DIAGNOSIS — E559 Vitamin D deficiency, unspecified: Secondary | ICD-10-CM | POA: Diagnosis not present

## 2022-10-30 DIAGNOSIS — E7849 Other hyperlipidemia: Secondary | ICD-10-CM | POA: Diagnosis not present

## 2022-10-30 DIAGNOSIS — F411 Generalized anxiety disorder: Secondary | ICD-10-CM

## 2022-10-30 DIAGNOSIS — I10 Essential (primary) hypertension: Secondary | ICD-10-CM | POA: Diagnosis not present

## 2022-10-30 DIAGNOSIS — G8929 Other chronic pain: Secondary | ICD-10-CM

## 2022-10-30 DIAGNOSIS — E038 Other specified hypothyroidism: Secondary | ICD-10-CM

## 2022-10-30 DIAGNOSIS — Z87891 Personal history of nicotine dependence: Secondary | ICD-10-CM

## 2022-10-30 DIAGNOSIS — M546 Pain in thoracic spine: Secondary | ICD-10-CM | POA: Insufficient documentation

## 2022-10-30 MED ORDER — HYDROCHLOROTHIAZIDE 12.5 MG PO TABS: 12.5000 mg | ORAL_TABLET | Freq: Every day | ORAL | 1 refills | Status: DC

## 2022-10-30 NOTE — Assessment & Plan Note (Signed)
Chronic Symptoms wax and wanes with certain movements She reports relief of symptoms when she takes meloxicam 15 mg Pain is non radiating Encouraged to continue on treatment regimen

## 2022-10-30 NOTE — Assessment & Plan Note (Signed)
She takes hydralazine 50 mg daily, clonazepam 0.5 mg as needed for anxiety She reports noncompliance with treatment regimen, stating to have decreased anxiety She denies suicidal thoughts and ideation She is following up with her psychiatrist on 11/23/2022

## 2022-10-30 NOTE — Assessment & Plan Note (Signed)
She is taking Depakote 1200 mg daily She follows up with her psychiatrist on 11/23/2022 No complaints or concerns voiced today

## 2022-10-30 NOTE — Progress Notes (Signed)
Established Patient Office Visit  Subjective:  Patient ID: Stacy Chambers, female    DOB: August 18, 1966  Age: 57 y.o. MRN: [id number]  CC:  Chief Complaint  Patient presents with   Follow-up    3 month f/u, would like to discuss weight loss options, reports right shoulder blade pain onset 05/26/2022 on and off has a flare up. Would also like to be put back on fluid pills due to a lump on her right ankle.     HPI Stacy Chambers is a 57 y.o. female with past medical history of hyperlipidemia, bipolar disorder , weight gain, and  generalized anxiety disorder presents for f/u of  chronic medical conditions. For the details of today's visit, please refer to the assessment and plan.     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   Chronic obstructive pulmonary disease ([place]) 10/03/2015   COPD (chronic obstructive pulmonary disease) (HCC)    no inhalers   Elevated liver function tests 11/26/2004   H/O NEGATIVE LIVER BIOPSY, workup negative   Gallstones 02/21/2015   GERD (gastroesophageal reflux disease)    Hyperlipidemia    Insomnia    Lower back pain    Osteoarthritis    Ovarian cancer ([place]) 1995   RLS (restless legs syndrome)    Sedative, hypnotic or anxiolytic dependence with withdrawal, 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[place])    Wears dentures    full upper and lower    Past Surgical History:  Procedure Laterality Date   ABDOMINAL HYSTERECTOMY     APPENDECTOMY     BREAST BIOPSY Left 03/12/2015   fat necrosis   CESAREAN SECTION     COLONOSCOPY WITH PROPOFOL N/A 05/30/2018   Procedure: COLONOSCOPY WITH PROPOFOL;  Surgeon: Manya Silvas, MD;  Location: [facility] ENDOSCOPY;  Service: Endoscopy;   Laterality: N/A;   EXCISION MORTON'S NEUROMA Left 10/07/2016   Procedure: EXCISION MORTON'S NEUROMA;  Surgeon: [name], DPM;  Location: [facility];  Service: Podiatry;  Laterality: Left;  IV WITH LOCAL   LIVER BIOPSY  2005   PerPt: Done to eval elevated LFTs and biopsy provided no definite dx/cause of elevated LFTs   MULTIPLE TOOTH EXTRACTIONS     OOPHORECTOMY     OSTECTOMY Right 04/10/2020   Procedure: DOUBLE OSTEOTOMY GENERAL W/ LOCAL;  Surgeon: [name], DPM;  Location: [facility];  Service: Podiatry;  Laterality: Right;    Family History  Adopted: Yes  Problem Relation Age of Onset   COPD Mother    Depression Mother    Colon cancer Mother        unsure of age   21 Father        Lung CA, Skin Cancer--?type   COPD Father    Stroke Father    Cancer Sister 74       cervical cancer   COPD Brother    Alcohol abuse Brother    Stroke Brother    COPD Maternal Aunt    Depression Daughter    Schizophrenia Son    Breast cancer Neg Hx     Social History   Socioeconomic History   Marital  status: Widowed    Spouse name: Not on file   Number of children: Not on file   Years of education: Not on file   Highest education level: Not on file  Occupational History   Not on file  Tobacco Use   Smoking status: Former    Packs/day: 1.00    Years: 30.00    Total pack years: 30.00    Types: Cigarettes    Quit date: 04/08/2009    Years since quitting: 13.5   Smokeless tobacco: Never  Vaping Use   Vaping Use: Never used  Substance and Sexual Activity   Alcohol use: Not Currently    Alcohol/week: 3.0 - 7.0 standard drinks of alcohol    Types: 3 - 7 Shots of liquor per week    Comment: occasionally   Drug use: Not Currently    Types: Marijuana    Comment: told to refrain until after surgery   Sexual activity: Yes    Birth control/protection: Surgical  Other Topics Concern   Not on file  Social History Narrative   Not on file   Social  Determinants of Health   Financial Resource Strain: Low Risk  (03/13/2021)   Overall Financial Resource Strain (CARDIA)    Difficulty of Paying Living Expenses: Not very hard  Food Insecurity: Not on file  Transportation Needs: Not on file  Physical Activity: Not on file  Stress: Not on file  Social Connections: Not on file  Intimate Partner Violence: Not on file    Outpatient Medications Prior to Visit  Medication Sig Dispense Refill   ammonium lactate (AMLACTIN) 12 % cream APPLY TOPICALLY AS NEEDED FOR DRY SKIN 385 g 0   buPROPion (WELLBUTRIN XL) 150 MG 24 hr tablet TAKE 1 TABLET EVERY DAY 90 tablet 3   clonazePAM (KLONOPIN) 0.5 MG tablet TAKE 1 TABLET 2 TO 3 TIMES A WEEK AS DIRECTED FOR SEVERE PANIC ATTACKS ONLY 6 tablet 1   divalproex (DEPAKOTE ER) 250 MG 24 hr tablet TAKE 1 TABLET EVERY DAY . TAKE ALONG WITH 1000 MG DAILY FOR TOTAL OF 1250 MG DAILY 90 tablet 0   divalproex (DEPAKOTE ER) 500 MG 24 hr tablet TAKE 2 TABLETS (1,000 MG TOTAL) BY MOUTH DAILY. TAKE ALONG WITH 250 MG DAILY - TOTAL OF 1250 MG 180 tablet 0   esomeprazole (NEXIUM) 20 MG capsule Take 1 capsule (20 mg total) by mouth daily as needed (take on empty stomach). 90 capsule 1   fluticasone (FLONASE) 50 MCG/ACT nasal spray Place 2 sprays into both nostrils daily. 16 mL 2   hydrOXYzine (ATARAX) 50 MG tablet TAKE 1 TABLET TWICE DAILY FOR SEVERE ANXIETY ATTACKS AS NEEDED 180 tablet 0   meloxicam (MOBIC) 15 MG tablet Take 1 tablet (15 mg total) by mouth daily. 90 tablet 1   methocarbamol (ROBAXIN) 500 MG tablet TAKE 1 TABLET EVERY DAY 60 tablet 3   rosuvastatin (CRESTOR) 10 MG tablet Take 1 tablet (10 mg total) by mouth daily. 90 tablet 1   traZODone (DESYREL) 100 MG tablet TAKE 1 TO 2 TABLETS (100 TO 200 MG TOTAL) AT BEDTIME AS NEEDED FOR SLEEP 60 tablet 3   triamcinolone cream (KENALOG) 0.1 % Apply 1 Application topically 2 (two) times daily. 30 g 0   alendronate (FOSAMAX) 70 MG tablet Take 70 mg by mouth once a week.      hydrochlorothiazide (HYDRODIURIL) 12.5 MG tablet Take 1 tablet (12.5 mg total) by mouth daily. 90 tablet 1   No facility-administered medications  prior to visit.    Allergies  Allergen Reactions   Benadryl [Diphenhydramine Hcl]     Hives/ rapid heartrate   Codeine     dizziness   Vicodin [Hydrocodone-Acetaminophen] Other (See Comments)    dizziness    ROS Review of Systems  Constitutional:  Negative for chills and fever.  Eyes:  Negative for visual disturbance.  Respiratory:  Negative for chest tightness and shortness of breath.   Musculoskeletal:        Right thoracic back pain  Neurological:  Negative for dizziness and headaches.      Objective:    Physical Exam HENT:     Head: Normocephalic.     Mouth/Throat:     Mouth: Mucous membranes are moist.  Cardiovascular:     Rate and Rhythm: Normal rate.     Heart sounds: Normal heart sounds.  Pulmonary:     Effort: Pulmonary effort is normal.     Breath sounds: Normal breath sounds.  Musculoskeletal:     Comments: No pain with palpation of the right thoracic spine Range of motion is intact No overlying skin changes Negative CVA tenderness   Neurological:     Mental Status: She is alert.     BP 112/80   Pulse 74   Ht '5\' 4"'$  (1.626 m)   Wt 171 lb 1.3 oz (77.6 kg)   SpO2 93%   BMI 29.37 kg/m  Wt Readings from Last 3 Encounters:  10/30/22 171 lb 1.3 oz (77.6 kg)  07/29/22 164 lb 1.9 oz (74.4 kg)  06/26/22 164 lb (74.4 kg)    Lab Results  Component Value Date   TSH 2.600 03/30/2022   Lab Results  Component Value Date   WBC 5.7 03/30/2022   HGB 12.3 03/30/2022   HCT 36.9 03/30/2022   MCV 91 03/30/2022   PLT 158 03/30/2022   Lab Results  Component Value Date   NA 143 03/30/2022   K 4.5 03/30/2022   CO2 25 03/30/2022   GLUCOSE 90 03/30/2022   BUN 13 03/30/2022   CREATININE 0.68 03/30/2022   BILITOT 0.3 03/30/2022   ALKPHOS 73 03/30/2022   AST 13 03/30/2022   ALT 13 03/30/2022   PROT 6.7  03/30/2022   ALBUMIN 4.5 03/30/2022   CALCIUM 9.6 03/30/2022   EGFR 102 03/30/2022   Lab Results  Component Value Date   CHOL 121 03/30/2022   Lab Results  Component Value Date   HDL 42 03/30/2022   Lab Results  Component Value Date   LDLCALC 56 03/30/2022   Lab Results  Component Value Date   TRIG 131 03/30/2022   Lab Results  Component Value Date   CHOLHDL 2.9 03/30/2022   Lab Results  Component Value Date   HGBA1C 5.5 03/30/2022      Assessment & Plan:  GAD (generalized anxiety disorder) Assessment & Plan: She takes hydralazine 50 mg daily, clonazepam 0.5 mg as needed for anxiety She reports noncompliance with treatment regimen, stating to have decreased anxiety She denies suicidal thoughts and ideation She is following up with her psychiatrist on 11/23/2022   Smoking history -     CT CHEST LUNG CANCER SCREENING LOW DOSE WO CONTRAST  Bipolar 1 disorder, mixed, moderate ([place]) Assessment & Plan: She is taking Depakote 1200 mg daily She follows up with her psychiatrist on 11/23/2022 No complaints or concerns voiced today   Chronic right-sided thoracic back pain Assessment & Plan: Chronic Symptoms wax and wanes with certain movements She reports  relief of symptoms when she takes meloxicam 15 mg Pain is non radiating Encouraged to continue on treatment regimen   IFG (impaired fasting glucose) -     Hemoglobin A1c  Vitamin D deficiency -     VITAMIN D 25 Hydroxy (Vit-D Deficiency, Fractures)  Other specified hypothyroidism -     TSH + free T4  Other hyperlipidemia Assessment & Plan: She takes rosuvastatin 10 mg daily She denies aches and pain Will assess lipid panel today  Orders: -     Lipid panel -     CMP14+EGFR -     CBC with Differential/Platelet  Essential hypertension -     hydroCHLOROthiazide; Take 1 tablet (12.5 mg total) by mouth daily.  Dispense: 90 tablet; Refill: 1    Follow-up: No follow-ups on file.   Alvira Monday,  FNP

## 2022-10-30 NOTE — Assessment & Plan Note (Signed)
She takes rosuvastatin 10 mg daily She denies aches and pain Will assess lipid panel today

## 2022-10-30 NOTE — Patient Instructions (Signed)
I appreciate the opportunity to provide care to you today!    Follow up:  3 months  Labs: please stop by the lab today to get your blood drawn (CBC, CMP, TSH, Lipid profile, HgA1c, Vit D) Please stop by [facility] anytime to get an x-ray of the left hip and both hands     Please continue to a heart-healthy diet and increase your physical activities. Try to exercise for 22mns at least three times a week.      It was a pleasure to see you and I look forward to continuing to work together on your health and well-being. Please do not hesitate to call the office if you need care or have questions about your care.   Have a wonderful day and week. With Gratitude, GAlvira MondayMSN, FNP-BC

## 2022-10-31 LAB — CMP14+EGFR
ALT: 16 IU/L (ref 0–32)
AST: 18 IU/L (ref 0–40)
Albumin/Globulin Ratio: 2 (ref 1.2–2.2)
Albumin: 4.9 g/dL (ref 3.8–4.9)
Alkaline Phosphatase: 77 IU/L (ref 44–121)
BUN/Creatinine Ratio: 24 — ABNORMAL HIGH (ref 9–23)
BUN: 16 mg/dL (ref 6–24)
Bilirubin Total: 0.3 mg/dL (ref 0.0–1.2)
CO2: 25 mmol/L (ref 20–29)
Calcium: 10.2 mg/dL (ref 8.7–10.2)
Chloride: 96 mmol/L (ref 96–106)
Creatinine, Ser: 0.67 mg/dL (ref 0.57–1.00)
Globulin, Total: 2.5 g/dL (ref 1.5–4.5)
Glucose: 93 mg/dL (ref 70–99)
Potassium: 4 mmol/L (ref 3.5–5.2)
Sodium: 138 mmol/L (ref 134–144)
Total Protein: 7.4 g/dL (ref 6.0–8.5)
eGFR: 102 mL/min/{1.73_m2} (ref >59)

## 2022-10-31 LAB — CBC WITH DIFFERENTIAL/PLATELET
Basophils Absolute: 0 10*3/uL (ref 0.0–0.2)
Basos: 0 %
EOS (ABSOLUTE): 0.1 10*3/uL (ref 0.0–0.4)
Eos: 1 %
Hematocrit: 35.5 % (ref 34.0–46.6)
Hemoglobin: 12.2 g/dL (ref 11.1–15.9)
Immature Grans (Abs): 0.1 10*3/uL (ref 0.0–0.1)
Immature Granulocytes: 1 %
Lymphocytes Absolute: 2 10*3/uL (ref 0.7–3.1)
Lymphs: 29 %
MCH: 30.9 pg (ref 26.6–33.0)
MCHC: 34.4 g/dL (ref 31.5–35.7)
MCV: 90 fL (ref 79–97)
Monocytes Absolute: 0.6 10*3/uL (ref 0.1–0.9)
Monocytes: 9 %
Neutrophils Absolute: 4.2 10*3/uL (ref 1.4–7.0)
Neutrophils: 60 %
Platelets: 181 10*3/uL (ref 150–450)
RBC: 3.95 x10E6/uL (ref 3.77–5.28)
RDW: 12.8 % (ref 11.7–15.4)
WBC: 7 10*3/uL (ref 3.4–10.8)

## 2022-10-31 LAB — LIPID PANEL
Chol/HDL Ratio: 3.1 ratio (ref 0.0–4.4)
Cholesterol, Total: 172 mg/dL (ref 100–199)
HDL: 55 mg/dL (ref >39)
LDL Chol Calc (NIH): 83 mg/dL (ref 0–99)
Triglycerides: 205 mg/dL — ABNORMAL HIGH (ref 0–149)
VLDL Cholesterol Cal: 34 mg/dL (ref 5–40)

## 2022-10-31 LAB — VITAMIN D 25 HYDROXY (VIT D DEFICIENCY, FRACTURES): Vit D, 25-Hydroxy: 25.7 ng/mL — ABNORMAL LOW (ref 30.0–100.0)

## 2022-10-31 LAB — HEMOGLOBIN A1C
Est. average glucose Bld gHb Est-mCnc: 108 mg/dL
Hgb A1c MFr Bld: 5.4 % (ref 4.8–5.6)

## 2022-10-31 LAB — TSH+FREE T4
Free T4: 0.99 ng/dL (ref 0.82–1.77)
TSH: 1.7 u[IU]/mL (ref 0.450–4.500)

## 2022-11-02 ENCOUNTER — Other Ambulatory Visit: Payer: Self-pay | Admitting: Family Medicine

## 2022-11-02 DIAGNOSIS — E559 Vitamin D deficiency, unspecified: Secondary | ICD-10-CM

## 2022-11-02 MED ORDER — VITAMIN D (ERGOCALCIFEROL) 1.25 MG (50000 UNIT) PO CAPS: 50000.0000 [IU] | ORAL_CAPSULE | ORAL | 3 refills | Status: DC

## 2022-11-11 ENCOUNTER — Other Ambulatory Visit
Admission: RE | Admit: 2022-11-11 | Discharge: 2022-11-11 | Disposition: A | Discharge status: Home / Self Care | Payer: Medicare HMO | Source: Ambulatory Visit | Attending: Psychiatry | Admitting: Psychiatry

## 2022-11-11 ENCOUNTER — Telehealth: Payer: Self-pay

## 2022-11-11 DIAGNOSIS — F411 Generalized anxiety disorder: Secondary | ICD-10-CM | POA: Diagnosis not present

## 2022-11-11 DIAGNOSIS — Z79899 Other long term (current) drug therapy: Secondary | ICD-10-CM | POA: Diagnosis not present

## 2022-11-11 DIAGNOSIS — F3178 Bipolar disorder, in full remission, most recent episode mixed: Secondary | ICD-10-CM | POA: Diagnosis not present

## 2022-11-11 LAB — VALPROIC ACID LEVEL: Valproic Acid Lvl: 70 ug/mL (ref 50.0–100.0)

## 2022-11-11 NOTE — Telephone Encounter (Signed)
Depakote level was completed today, therapeutic.  Please let patient know.  Will discuss further when she comes in for her visit next week.

## 2022-11-11 NOTE — Telephone Encounter (Signed)
pt asked if you was going to order bloodwork for depakote levels.

## 2022-11-13 ENCOUNTER — Other Ambulatory Visit: Payer: Self-pay | Admitting: Psychiatry

## 2022-11-13 DIAGNOSIS — Z634 Disappearance and death of family member: Secondary | ICD-10-CM

## 2022-11-13 NOTE — Telephone Encounter (Signed)
Pt notified

## 2022-11-18 IMAGING — MG DIGITAL DIAGNOSTIC BILAT W/ TOMO W/ CAD
8 series · 8 of 24 positions shown · non-contrast
Comparison: Previous exam(s).

CLINICAL DATA: BI-RADS 3 follow-up of a RIGHT breast mass,
initiated 6767

EXAM:
DIGITAL DIAGNOSTIC BILATERAL MAMMOGRAM WITH TOMOSYNTHESIS AND CAD;
ULTRASOUND RIGHT BREAST LIMITED
TECHNIQUE: Bilateral digital diagnostic mammography and breast tomosynthesis
was performed. The images were evaluated with computer-aided
detection.; Targeted ultrasound examination of the right breast was
performed

[R CC synth-2D]
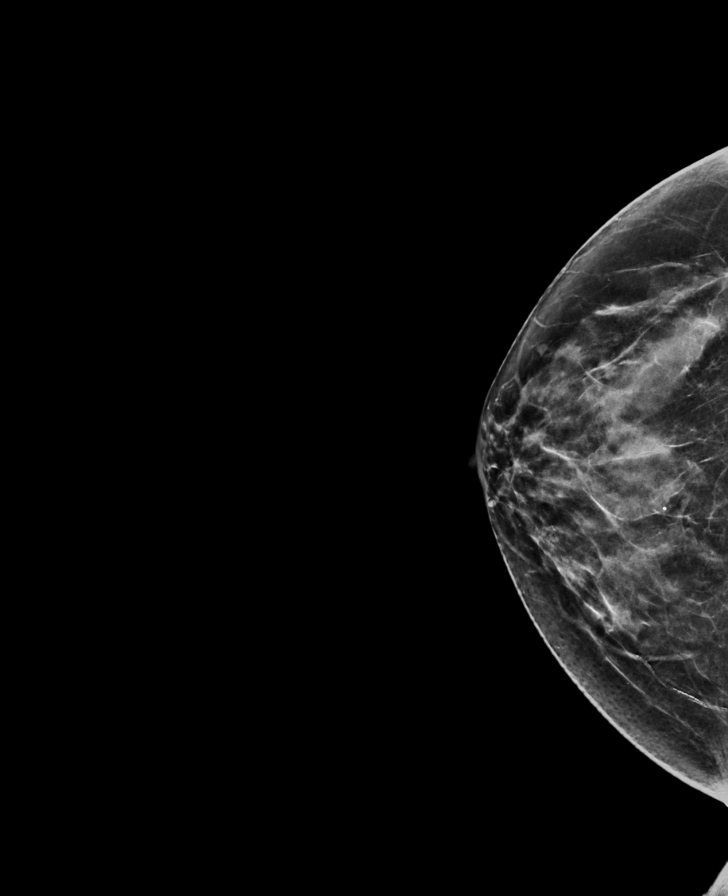

[L MLO synth-2D]
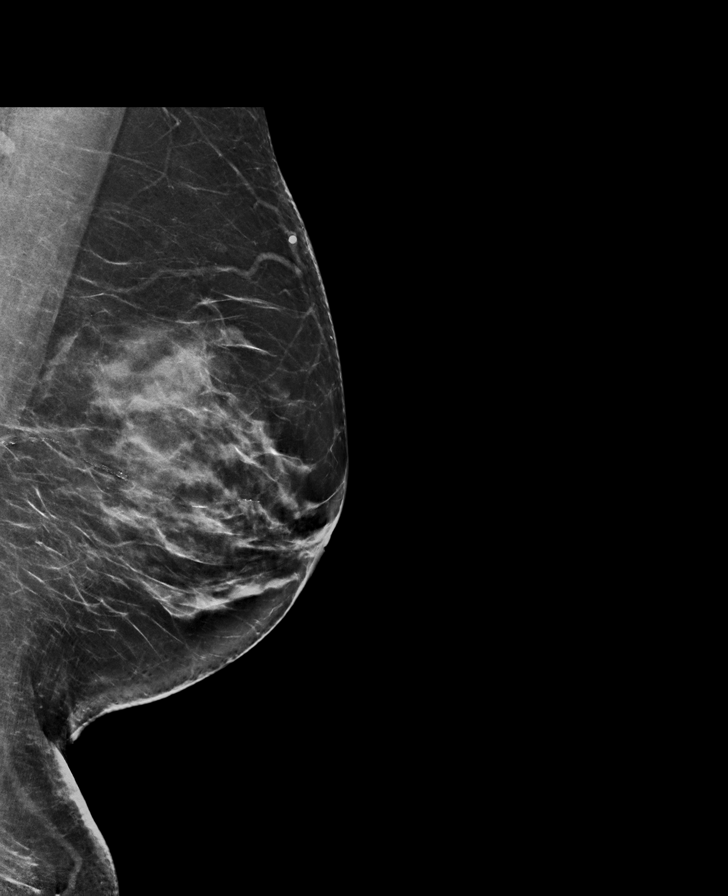

[R MLO synth-2D]
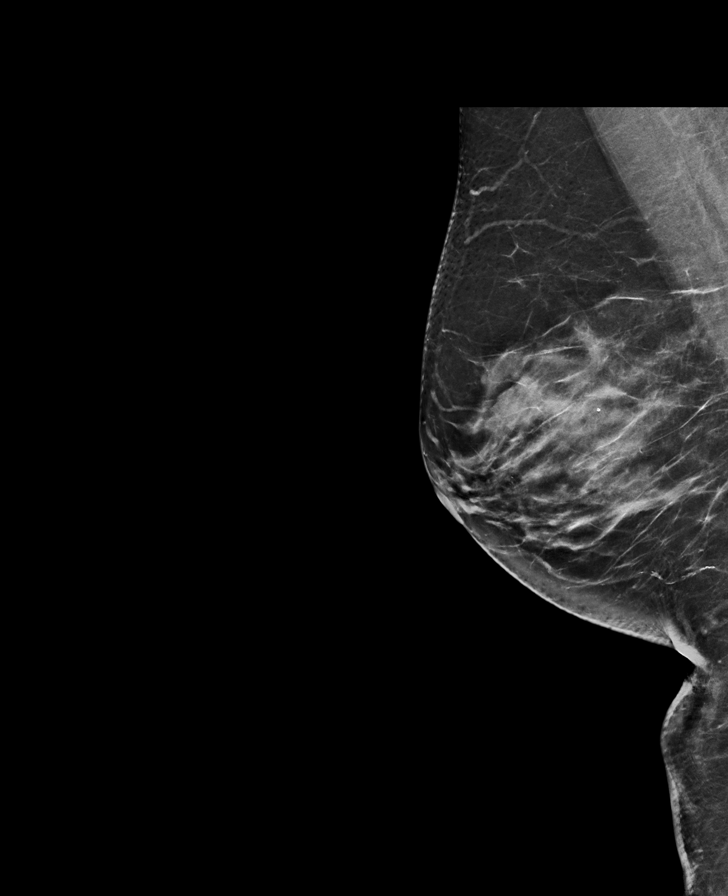

[L CC synth-2D]
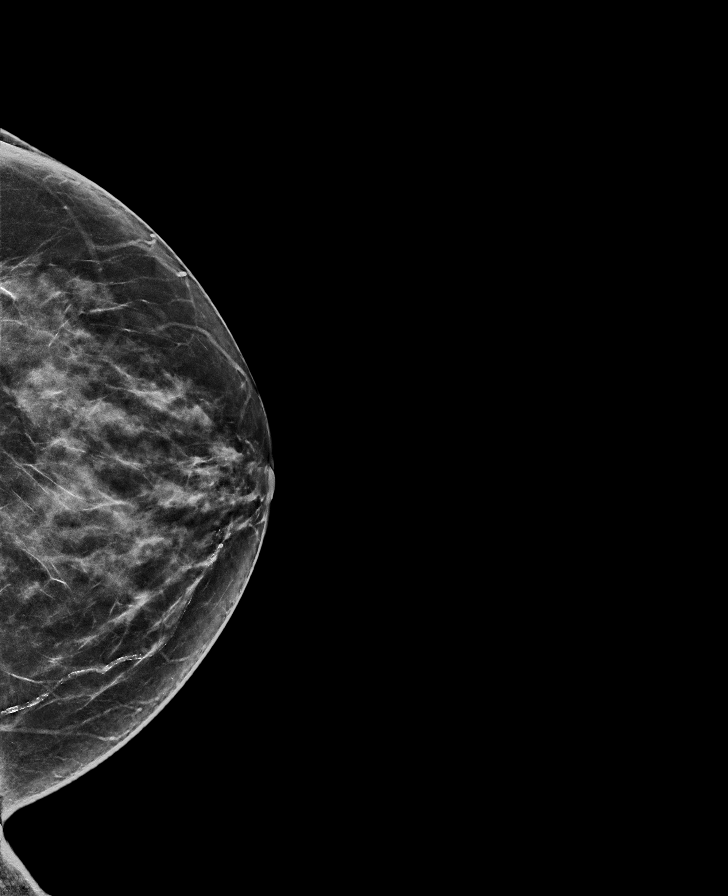

[L CC tomo · tomo slice 34/67.0]
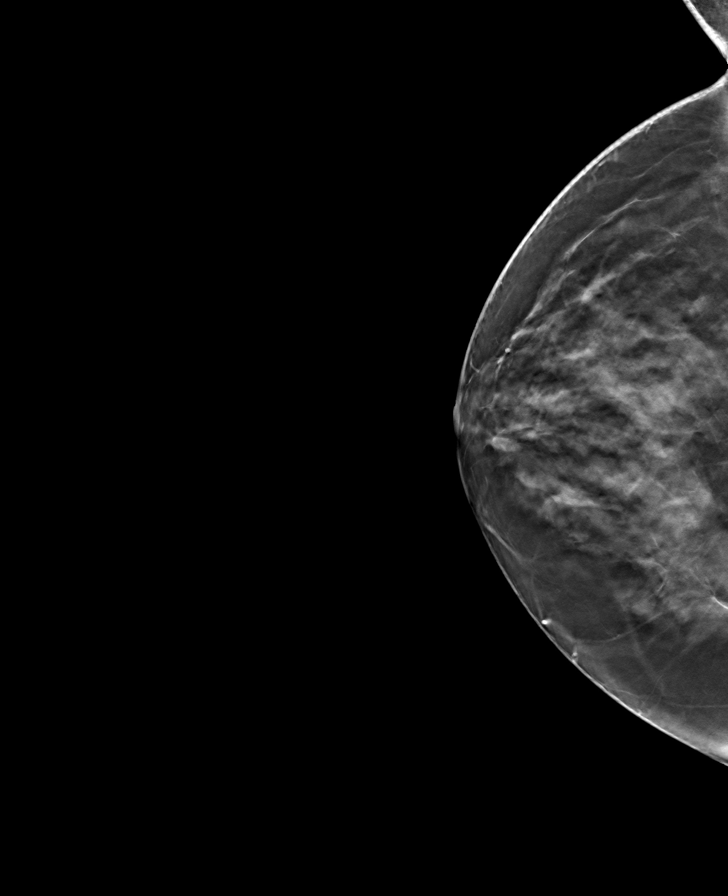

[R MLO tomo · tomo slice 36/71.0]
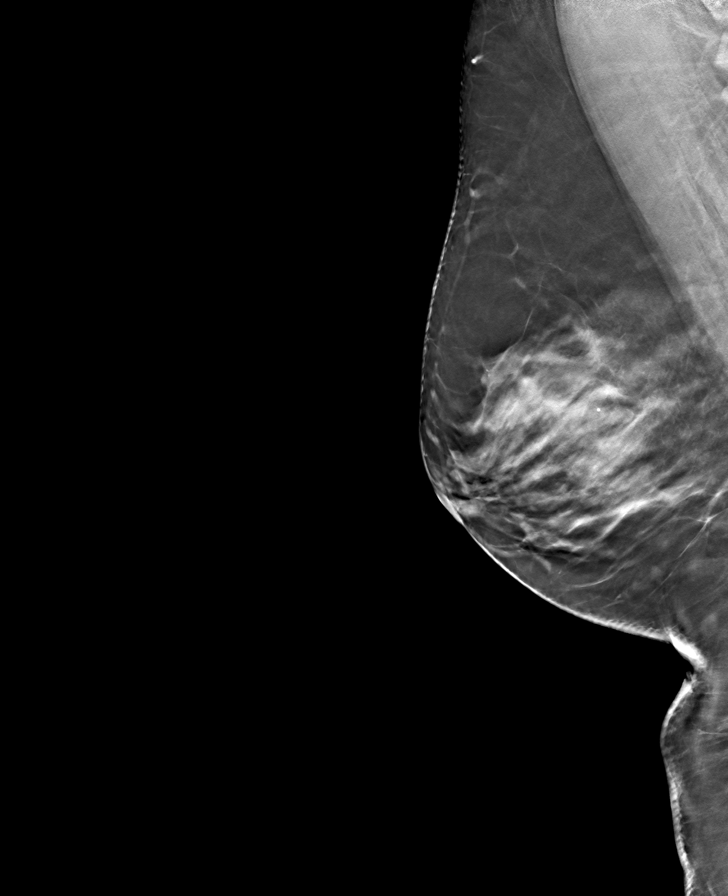

[R CC tomo · tomo slice 31/62.0]
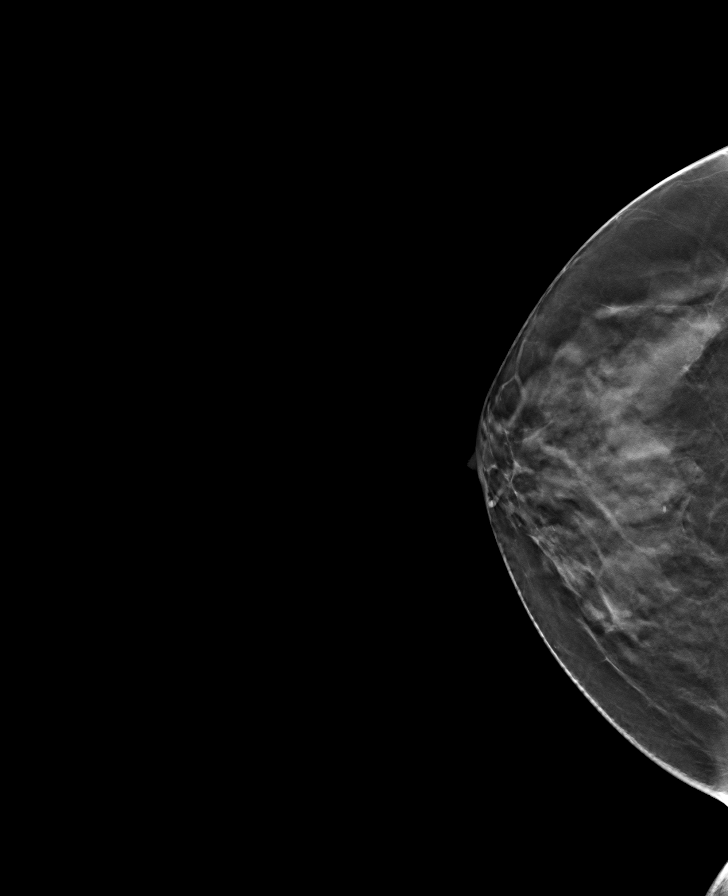

[L MLO tomo · tomo slice 41/81.0]
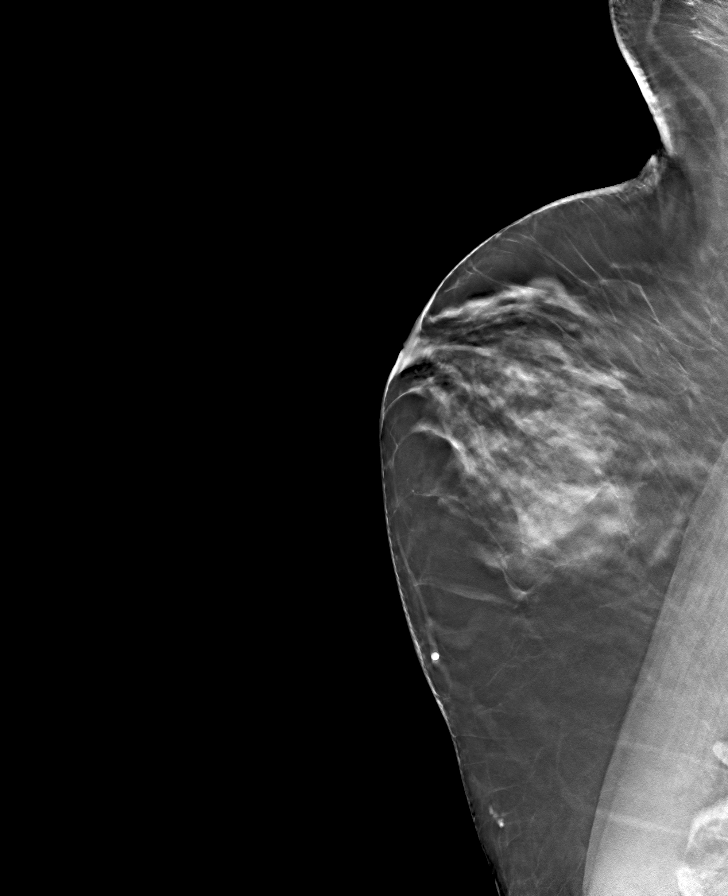

[8 of 24 positions shown; findings below may reference images not displayed]

ACR Breast Density Category c: The breast tissue is heterogeneously
dense, which may obscure small masses.
FINDINGS: Diagnostic images demonstrate decreased conspicuity of originally
questioned mass screening mammogram dated November 03, 2018. No
suspicious mass, distortion, or microcalcifications are identified
to suggest presence of malignancy bilaterally.

Targeted ultrasound was performed of the RIGHT upper inner breast.
At 2 o'clock 4 cm from the nipple, there is an oval circumscribed
anechoic mass with thin internal septation and posterior acoustic
enhancement. This is consistent with a benign cluster of cysts. It
measures 6 x 3 x 7 mm, previously 6 x 5 by 4 mm.
IMPRESSION: 1. There is a benign cluster of cysts in the RIGHT breast which has
been relatively stable over the course of greater than 2 years,
consistent with a benign etiology.
2. No mammographic evidence of malignancy bilaterally.

RECOMMENDATION:
Screening mammogram in one year.(Code:XY-H-4RC)

I have discussed the findings and recommendations with the patient.
If applicable, a reminder letter will be sent to the patient
regarding the next appointment.

BI-RADS CATEGORY  2: Benign.

## 2022-11-23 ENCOUNTER — Ambulatory Visit: Payer: Medicare HMO | Admitting: Psychiatry

## 2022-11-23 ENCOUNTER — Encounter: Payer: Self-pay | Admitting: Psychiatry

## 2022-11-23 ENCOUNTER — Telehealth: Payer: Self-pay

## 2022-11-23 VITALS — BP 135/86 | HR 62 | Temp 97.9°F | Ht 64.0 in | Wt 174.6 lb

## 2022-11-23 DIAGNOSIS — F3178 Bipolar disorder, in full remission, most recent episode mixed: Secondary | ICD-10-CM

## 2022-11-23 DIAGNOSIS — Z79899 Other long term (current) drug therapy: Secondary | ICD-10-CM | POA: Diagnosis not present

## 2022-11-23 DIAGNOSIS — Z634 Disappearance and death of family member: Secondary | ICD-10-CM

## 2022-11-23 DIAGNOSIS — G2581 Restless legs syndrome: Secondary | ICD-10-CM | POA: Diagnosis not present

## 2022-11-23 DIAGNOSIS — F5105 Insomnia due to other mental disorder: Secondary | ICD-10-CM | POA: Diagnosis not present

## 2022-11-23 DIAGNOSIS — G3184 Mild cognitive impairment, so stated: Secondary | ICD-10-CM

## 2022-11-23 DIAGNOSIS — F411 Generalized anxiety disorder: Secondary | ICD-10-CM | POA: Diagnosis not present

## 2022-11-23 NOTE — Telephone Encounter (Signed)
Noted  

## 2022-11-23 NOTE — Telephone Encounter (Signed)
pt called states that you needed her medication she was taking. she states she takes benztropine '1mg'$ , depakote '1200mg'$ , trazodone '200mg'$ , valtaren '10mg'$  hydrochlorothiazide 12.'5mg'$ , crestor '10mg'$  and hydroxyzine '50mg'$ 

## 2022-11-23 NOTE — Progress Notes (Unsigned)
Stacy Chambers OP Progress Note  11/23/2022 9:53 AM Stacy Chambers  MRN:  [id number]  Chief Complaint:  Chief Complaint  Patient presents with   Follow-up   Depression   Anxiety   Medication Refill   HPI: Stacy Chambers is a 57 year old Caucasian female, lives in [place], has a history of bipolar disorder, GAD, insomnia, bulimia, RLS, MCI, memory loss, vitamin B12 deficiency was evaluated in office today.  Patient today reports she is currently engaged, her fiance is going to moving soon.  That does make her happy although she is anxious.  She reports since the past 2 weeks she has been unable to sleep.  She reports although she is able to fall asleep sleep is restless and interrupted throughout the night.  Yesterday she got to a point that she got so tired that she decided to take a 3-hour nap and that helped.  Patient reports she currently takes trazodone as well as escitalopram 20 mg at bedtime.  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  Patient reports it was also the birthday of her husband who passed away, her late son's birthday is also coming up next month.  It was also the birthday of her late sister a few weeks ago.  She has also has been grieving.  She has not been in psychotherapy since the past several months.  Felt she did not need it.  However agreeable to reach out to Mr. Maye Hides again.  Patient reports she is compliant on the Depakote.  Denies side effects.  Was able to get her Depakote levels done recently, reviewed and discussed with patient-therapeutic at 62.  Patient denies any suicidality, homicidality or perceptual disturbances.  Patient appeared to be alert, oriented to person place time and situation.  3 word memory immediate 3 out of 3, after 5 minutes 3 out of 3.  Patient's attention and focus seem to be good in the session, was able to do serial threes well.  Patient denies any other concerns today.  Visit  Diagnosis:    ICD-10-CM   1. Bipolar disorder, in full remission, most recent episode mixed ([place])  F31.78     2. GAD (generalized anxiety disorder)  F41.1     3. Insomnia due to mental disorder  F51.05    anxiety    4. Bereavement  Z63.4     5. Restless legs syndrome (RLS)  G25.81     6. MCI (mild cognitive impairment)  G31.84     7. High risk medication use  Z79.899       Past Psychiatric History: Reviewed past psychiatric history from progress note on 10/12/2018.  Past trials of medications like BuSpar, Zoloft, carbamazepine, Vraylar, Zyprexa, Lamictal, Lexapro, gabapentin, risperidone, Geodon.  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   Chronic obstructive pulmonary disease ([place]) 10/03/2015   COPD (chronic obstructive pulmonary disease) ([place])    no inhalers   Elevated liver function tests 11/26/2004   H/O NEGATIVE LIVER BIOPSY, workup negative   Gallstones 02/21/2015   GERD (gastroesophageal reflux disease)  Hyperlipidemia    Insomnia    Lower back pain    Osteoarthritis    Ovarian cancer ([place]) 1995   RLS (restless legs syndrome)    Sedative, hypnotic or anxiolytic dependence with withdrawal, 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[place])    Wears dentures    full upper and lower    Past Surgical History:  Procedure Laterality Date   ABDOMINAL HYSTERECTOMY     APPENDECTOMY     BREAST BIOPSY Left 03/12/2015   fat necrosis   CESAREAN SECTION     COLONOSCOPY WITH PROPOFOL N/A 05/30/2018   Procedure: COLONOSCOPY WITH PROPOFOL;  Surgeon: Manya Silvas, Chambers;  Location: [facility] ENDOSCOPY;  Service: Endoscopy;  Laterality: N/A;   EXCISION MORTON'S NEUROMA Left 10/07/2016    Procedure: EXCISION MORTON'S NEUROMA;  Surgeon: [name], DPM;  Location: [facility];  Service: Podiatry;  Laterality: Left;  IV WITH LOCAL   LIVER BIOPSY  2005   PerPt: Done to eval elevated LFTs and biopsy provided no definite dx/cause of elevated LFTs   MULTIPLE TOOTH EXTRACTIONS     OOPHORECTOMY     OSTECTOMY Right 04/10/2020   Procedure: DOUBLE OSTEOTOMY GENERAL W/ LOCAL;  Surgeon: [name], DPM;  Location: [facility];  Service: Podiatry;  Laterality: Right;    Family Psychiatric History: Reviewed family psychiatric history from progress note on 10/12/2018.  Family History:  Family History  Adopted: Yes  Problem Relation Age of Onset   COPD Mother    Depression Mother    Colon cancer Mother        unsure of age   43 Father        Lung CA, Skin Cancer--?type   COPD Father    Stroke Father    Cancer Sister 56       cervical cancer   COPD Brother    Alcohol abuse Brother    Stroke Brother    COPD Maternal Aunt    Depression Daughter    Schizophrenia Son    Breast cancer Neg Hx     Social History: Reviewed social history from progress note on 10/12/2018. Social History   Socioeconomic History   Marital status: Widowed    Spouse name: Not on file   Number of children: Not on file   Years of education: Not on file   Highest education level: Not on file  Occupational History   Not on file  Tobacco Use   Smoking status: Former    Packs/day: 1.00    Years: 30.00    Total pack years: 30.00    Types: Cigarettes    Quit date: 04/08/2009    Years since quitting: 13.6   Smokeless tobacco: Never  Vaping Use   Vaping Use: Never used  Substance and Sexual Activity   Alcohol use: Not Currently    Alcohol/week: 3.0 - 7.0 standard drinks of alcohol    Types: 3 - 7 Shots of liquor per week    Comment: occasionally   Drug use: Not Currently    Types: Marijuana    Comment: told to refrain until after surgery   Sexual activity: Yes    Birth  control/protection: Surgical  Other Topics Concern   Not on file  Social History Narrative   Not on file   Social Determinants of Health   Financial Resource Strain: Low Risk  (03/13/2021)   Overall Financial Resource Strain (CARDIA)    Difficulty of Paying Living Expenses: Not very hard  Food Insecurity: Not on file  Transportation  Needs: Not on file  Physical Activity: Not on file  Stress: Not on file  Social Connections: Not on file    Allergies:  Allergies  Allergen Reactions   Benadryl [Diphenhydramine Hcl]     Hives/ rapid heartrate   Codeine     dizziness   Vicodin [Hydrocodone-Acetaminophen] Other (See Comments)    dizziness    Metabolic Disorder Labs: Lab Results  Component Value Date   HGBA1C 5.4 10/30/2022   MPG 114 09/14/2018   No results found for: "PROLACTIN" Lab Results  Component Value Date   CHOL 172 10/30/2022   TRIG 205 (H) 10/30/2022   HDL 55 10/30/2022   CHOLHDL 3.1 10/30/2022   VLDL 18 03/04/2015   LDLCALC 83 10/30/2022   LDLCALC 56 03/30/2022   Lab Results  Component Value Date   TSH 1.700 10/30/2022   TSH 2.600 03/30/2022    Therapeutic Level Labs: Lab Results  Component Value Date   LITHIUM 0.3 (L) 10/24/2018   LITHIUM 0.49 (L) 01/13/2009   Lab Results  Component Value Date   VALPROATE 70 11/11/2022   VALPROATE 29 (L) 06/02/2022   No results found for: "CBMZ"  Current Medications: Current Outpatient Medications  Medication Sig Dispense Refill   clonazePAM (KLONOPIN) 0.5 MG tablet TAKE 1 TABLET 2 TO 3 TIMES A WEEK AS DIRECTED FOR SEVERE PANIC ATTACKS ONLY 6 tablet 1   divalproex (DEPAKOTE ER) 250 MG 24 hr tablet TAKE 1 TABLET EVERY DAY . TAKE ALONG WITH 1000 MG DAILY FOR TOTAL OF 1250 MG DAILY 90 tablet 0   divalproex (DEPAKOTE ER) 500 MG 24 hr tablet TAKE 2 TABLETS (1,000 MG TOTAL) BY MOUTH DAILY. TAKE ALONG WITH 250 MG DAILY - TOTAL OF 1250 MG 180 tablet 0   escitalopram (LEXAPRO) 20 MG tablet Take 20 mg by mouth daily.      esomeprazole (NEXIUM) 20 MG capsule Take 1 capsule (20 mg total) by mouth daily as needed (take on empty stomach). 90 capsule 1   fluticasone (FLONASE) 50 MCG/ACT nasal spray Place 2 sprays into both nostrils daily. 16 mL 2   hydrochlorothiazide (HYDRODIURIL) 12.5 MG tablet Take 1 tablet (12.5 mg total) by mouth daily. 90 tablet 1   hydrOXYzine (ATARAX) 50 MG tablet TAKE 1 TABLET TWICE DAILY FOR SEVERE ANXIETY ATTACKS AS NEEDED 180 tablet 1   meloxicam (MOBIC) 15 MG tablet Take 1 tablet (15 mg total) by mouth daily. 90 tablet 1   methocarbamol (ROBAXIN) 500 MG tablet TAKE 1 TABLET EVERY DAY 60 tablet 3   rosuvastatin (CRESTOR) 10 MG tablet Take 1 tablet (10 mg total) by mouth daily. 90 tablet 1   traZODone (DESYREL) 100 MG tablet TAKE 1 TO 2 TABLETS (100 TO 200 MG TOTAL) AT BEDTIME AS NEEDED FOR SLEEP 60 tablet 3   Vitamin D, Ergocalciferol, (DRISDOL) 1.25 MG (50000 UNIT) CAPS capsule Take 1 capsule (50,000 Units total) by mouth every 7 (seven) days. 10 capsule 3   No current facility-administered medications for this visit.     Musculoskeletal: Strength & Muscle Tone: within normal limits Gait & Station: normal Patient leans: N/A  Psychiatric Specialty Exam: Review of Systems  Psychiatric/Behavioral:  Positive for sleep disturbance. The patient is nervous/anxious.        Grieving  All other systems reviewed and are negative.   Blood pressure 135/86, pulse 62, temperature 97.9 F (36.6 C), temperature source Temporal, height '5\' 4"'$  (1.626 m), weight 174 lb 9.6 oz (79.2 kg), SpO2 97 %.Body mass index  is 29.97 kg/m.  General Appearance: Casual  Eye Contact:  Good  Speech:  Clear and Coherent  Volume:  Normal  Mood:  Anxious, grieving  Affect:  Congruent  Thought Process:  Goal Directed and Descriptions of Associations: Intact  Orientation:  Full (Time, Place, and Person)  Thought Content: Logical   Suicidal Thoughts:  No  Homicidal Thoughts:  No  Memory:  Immediate;    Fair Recent;   Fair Remote;   limited  Judgement:  Fair  Insight:  Shallow  Psychomotor Activity:  Normal  Concentration:  Concentration: Fair and Attention Span: Fair  Recall:  [employer] of Knowledge: Fair  Language: Fair  Akathisia:  No  Handed:  Right  AIMS (if indicated): done  Assets:  Communication Skills Desire for Improvement Housing Intimacy Social Support  ADL's:  Intact  Cognition: WNL  Sleep:  Poor   Screenings: [place] Office Visit from 11/23/2022 in [facility] Video Visit from 06/01/2022 in [facility] Video Visit from 03/24/2022 in [place] Video Visit from 06/04/2021 in [place] Office Visit from 01/28/2021 in [place] Total Score 0 0 0 0 0      [place] Office Visit from 11/23/2022 in [facility] Office Visit from 10/30/2022 in [facility] Office Visit from 07/29/2022 in [facility] from 03/03/2022 in [place] at [place] from 01/13/2022 in [facility] [place]  Total GAD-7 Score 2 0 '8 15 7      '$ PHQ2-9    [place] Office Visit from 11/23/2022 in [place] Office Visit from 10/30/2022 in [facility] Video Visit from 08/24/2022 in [facility] Office Visit from 07/29/2022 in [facility] Office Visit from 06/26/2022 in [facility]  PHQ-2 Total Score 0 0 0 2 0  PHQ-9 Total Score -- 0 2 9 --      [place] Office Visit from 11/23/2022 in [facility] Video Visit  from 08/24/2022 in [place] Video Visit from 06/01/2022 in [place] No Risk Low Risk Low Risk        Assessment and Plan: DONN WILMOT is a 57 year old Caucasian female, widowed, lives in [place], has a history of bipolar disorder, anxiety disorder, COPD, RLS, vitamin B12 deficiency was evaluated in office today.  Patient is currently grieving, anxious as well as has insomnia, will benefit from the following plan.  Plan Bipolar disorder in remission Depakote ER 1250 mg p.o. daily Continue Wellbutrin XL 150 mg p.o. daily for now however patient not sure if she is taking it or not.  Patient to check once she gets home and Biochemist, clinical know. Trazodone 100-200 mg p.o. nightly for sleep. Patient advised to use hydroxyzine 50 mg at bedtime as needed for sleep as well.  GAD-unstable Patient advised to reach out to Mr. Maye Hides to restart CBT. Patient reports she is back on escitalopram 20 mg p.o. daily.  Advised to take this medication in the morning since it is likely affecting sleep.  She could try taking it for 2  weeks in the morning and if it continues to affect her sleep or make her more agitated she could reduce it to 15 mg p.o. daily Once patient is able to confirm her medications once she reaches home after this visit if she is still taking the Wellbutrin advised her to stop the Wellbutrin if she is planning to stay on the Lexapro.  Provided education about drug to drug interaction, adverse side effects. Klonopin 0.5 mg up to 2-3 times a week only for severe anxiety attacks.[place] Patient to restart CBT.   RLS-stable Will monitor closely.  History of MCI-patient was referred to neurology in the past .  Will monitor and reevaluate patient as needed.  High risk medication use-reviewed and discussed labs-11/11/2022-Depakote level-70-therapeutic,  10/30/2022-platelet count-within normal limits, 10/30/2022-CMP-within normal limits, TSH-within normal limits.  Follow-up in clinic in 4 weeks or sooner if needed.  This note was generated in part or whole with voice recognition software. Voice recognition is usually quite accurate but there are transcription errors that can and very often do occur. I apologize for any typographical errors that were not detected and corrected.  Addendum dated 11/23/2022-5 PM Patient left a message stating she is no longer taking the Wellbutrin.  Has been noncompliant.  Will discontinue Wellbutrin.  Will have patient continue Lexapro as advised, patient to take the Lexapro during the day.  Since Lexapro likely also affecting her sleep.     Ursula Alert, Chambers 11/24/2022, 9:21 AM

## 2022-12-05 ENCOUNTER — Other Ambulatory Visit: Payer: Self-pay | Admitting: Family Medicine

## 2022-12-05 DIAGNOSIS — G8929 Other chronic pain: Secondary | ICD-10-CM

## 2022-12-07 ENCOUNTER — Ambulatory Visit (HOSPITAL_COMMUNITY): Payer: Medicare HMO

## 2022-12-07 NOTE — Telephone Encounter (Signed)
She has 3 refills

## 2022-12-08 ENCOUNTER — Ambulatory Visit (INDEPENDENT_AMBULATORY_CARE_PROVIDER_SITE_OTHER): Payer: Medicare HMO | Admitting: Clinical

## 2022-12-08 DIAGNOSIS — F3131 Bipolar disorder, current episode depressed, mild: Secondary | ICD-10-CM | POA: Diagnosis not present

## 2022-12-08 DIAGNOSIS — R41840 Attention and concentration deficit: Secondary | ICD-10-CM

## 2022-12-08 DIAGNOSIS — F5105 Insomnia due to other mental disorder: Secondary | ICD-10-CM | POA: Diagnosis not present

## 2022-12-08 DIAGNOSIS — F411 Generalized anxiety disorder: Secondary | ICD-10-CM

## 2022-12-08 NOTE — Progress Notes (Signed)
Virtual Visit via Video Note  I connected with Stacy Chambers on 12/08/22 at  8:00 AM EST by a video enabled telemedicine application and verified that I am speaking with the correct person using two identifiers.  Location: Patient: Home Provider: Office   I discussed the limitations of evaluation and management by telemedicine and the availability of in person appointments. The patient expressed understanding and agreed to proceed.    THERAPIST PROGRESS NOTE   Session Time: 8:00 AM-8:30 AM   Participation Level: Active   Behavioral Response: CasualAlertDepressed   Type of Therapy: Individual Therapy   Treatment Goals addressed: Coping   Interventions: CBT   Summary: Stacy Rushing. Chambers is a 57 y.o. female who presents with Bipolar Disorder./ GAD/Insomnia. The OPT therapist worked with the patient for her ongoing OPT treatment session. The OPT therapist utilized Motivational Interviewing to assist in creating therapeutic repore. The patient in the session was engaged and work in collaboration giving feedback about her triggers and symptoms over the past few weeks.The patient spoke about her current boyfriend is in the process of moving into her. The OPT therapist utilized Cognitive Behavioral Therapy through cognitive restructuring as well as worked with the patient on coping strategies to assist in management of mood. The OPT therapist worked with the patient on continuing to maintain her basic needs of self care with eating, hygiene, and sleep. The patient spoke about her current relationship with a guy 10 years her junior. The patient spoke about enjoying having company again and having someone in the home and someone to help around the house. The OPT therapist reviewed with the patient the importance of getting back to consistency in taking her med management as the patient admitted she started to feel like she didn't need the medicines and stopped taking them.  Suicidal/Homicidal:  Nowithout intent/plan   Therapist Response: The OPT therapist worked with the patient for the patients scheduled session. The patient was engaged in her session and gave feedback in relation to triggers, symptoms, and behavior responses over the past few weeks. The patient spoke about the change of having a new boyfriend 10 years her junior who is currently move into her home .The OPT therapist worked with the patient utilizing an in session Cognitive Behavioral Therapy exercise. The patient was responsive in the session and verbalized," I recently had my late husband and my sons birthday and that was hard".  The OPT therapist worked with the patient providing psycho-therapy connected to the grieving the patient has been experiencing due to being triggered by the birthdays of recently of passed loved ones.The OPT therapist worked with the patient on staying active , managing her basic health needs, and being consistent with her medication therapy plan. The OPT therapist worked with the patient gauging activity level, coping skill implementation, and mood.The OPT therapist will continue treatment work with the patient in her next scheduled session    Plan: Return again in 3 weeks.   Diagnosis:      Axis I: Bipolar 1 disorder,depressed mild/GAD/Insomnia/ Attention deficit.                             Axis II: No diagnosis     Collaboration of Care: Collaboration of care reviewed with medication therapy involvement with psychiatrist Dr. Shea Evans.   Patient/Guardian was advised Release of Information must be obtained prior to any record release in order to collaborate their care with an outside provider.  Patient/Guardian was advised if they have not already done so to contact the registration department to sign all necessary forms in order for [name] to release information regarding their care.    Consent: Patient/Guardian gives verbal consent for treatment and assignment of benefits for services provided  during this visit. Patient/Guardian expressed understanding and agreed to proceed.        I discussed the assessment and treatment plan with the patient. The patient was provided an opportunity to ask questions and all were answered. The patient agreed with the plan and demonstrated an understanding of the instructions.   The patient was advised to call back or seek an in-person evaluation if the symptoms worsen or if the condition fails to improve as anticipated.   I provided 30 minutes of non-face-to-face time during this encounter.   [name], LCSW   12/08/2022

## 2023-01-04 ENCOUNTER — Ambulatory Visit (HOSPITAL_COMMUNITY): Payer: Medicare HMO

## 2023-01-06 ENCOUNTER — Ambulatory Visit (INDEPENDENT_AMBULATORY_CARE_PROVIDER_SITE_OTHER): Payer: HMO | Admitting: Clinical

## 2023-01-06 ENCOUNTER — Other Ambulatory Visit (HOSPITAL_COMMUNITY): Payer: Self-pay

## 2023-01-06 DIAGNOSIS — F5105 Insomnia due to other mental disorder: Secondary | ICD-10-CM | POA: Diagnosis not present

## 2023-01-06 DIAGNOSIS — F411 Generalized anxiety disorder: Secondary | ICD-10-CM

## 2023-01-06 DIAGNOSIS — F3131 Bipolar disorder, current episode depressed, mild: Secondary | ICD-10-CM

## 2023-01-06 NOTE — Progress Notes (Signed)
Virtual Visit via Video Note   I connected with Stacy Chambers on 01/06/23 at  8:00 AM EST by a video enabled telemedicine application and verified that I am speaking with the correct person using two identifiers.   Location: Patient: Home Provider: Office   I discussed the limitations of evaluation and management by telemedicine and the availability of in person appointments. The patient expressed understanding and agreed to proceed.     THERAPIST PROGRESS NOTE   Session Time: 8:00 AM-8:30 AM   Participation Level: Active   Behavioral Response: CasualAlertDepressed   Type of Therapy: Individual Therapy   Treatment Goals addressed: Coping   Interventions: CBT   Summary: Stacy Chambers. Vik is a 57 y.o. female who presents with Bipolar Disorder./ GAD/Insomnia. The OPT therapist worked with the patient for her ongoing OPT treatment session. The OPT therapist utilized Motivational Interviewing to assist in creating therapeutic repore. The patient in the session was engaged and work in collaboration giving feedback about her triggers and symptoms over the past few weeks.The patient spoke about being under the weather for the last and is currently taking OTC medicine. The patient noted she tried taking a few shots of whiskey but that didn't help, but notes she does not have a temperature. The patient noted if it doesn't get better she will seek PCP help. The OPT therapist utilized Cognitive Behavioral Therapy through cognitive restructuring as well as worked with the patient on coping strategies to assist in management of mood. The OPT therapist worked with the patient on continuing to maintain her basic needs of self care with eating, hygiene, and sleep. The patient notes she is doing well with her interactions with others in the home.The patient spoke about her current relationship and noted things are going well still. The patient spoke about enjoying having company again and having someone in  the home and someone to help around the house. The patient spoke about sharing the home and subleasing, but noted overall things are going well. The patient noted she suspected she got the cold from the people she is subleasing with and who are living with her in the home.    Suicidal/Homicidal: Nowithout intent/plan   Therapist Response: The OPT therapist worked with the patient for the patients scheduled session. The patient was engaged in her session and gave feedback in relation to triggers, symptoms, and behavior responses over the past few weeks. The patient spoke about relationship new boyfriend 10 years her junior who is currently living in her home .The patient spoke about being under the weather. The patient spoke about changing insurance for a better insurance plan. The OPT therapist worked with the patient utilizing an in session Cognitive Behavioral Therapy exercise. The patient was responsive in the session and verbalized," I have just been feeling bad cause I am under the weather for the past 5 days  I got it from the people I live with".  The OPT therapist worked with the patient providing psycho-therapy connected to the grieving the patient has been experiencing due to being triggered by being under the weather. The OPT therapist worked with the patient on staying active , managing her basic health needs, and being consistent with her medication therapy plan. The OPT therapist worked with the patient gauging activity level, coping skill implementation, and mood. The patient spoke about making plans for vacationing with the boyfriend at the end of May.The OPT therapist will continue treatment work with the patient in her next scheduled session  Plan: Return again in 3 weeks.   Diagnosis:      Axis I: Bipolar 1 disorder,depressed mild/GAD/Insomnia/ Attention deficit.                             Axis II: No diagnosis     Collaboration of Care: Collaboration of care reviewed with  medication therapy involvement with psychiatrist Dr. Shea Evans.   Patient/Guardian was advised Release of Information must be obtained prior to any record release in order to collaborate their care with an outside provider. Patient/Guardian was advised if they have not already done so to contact the registration department to sign all necessary forms in order for Stacy Chambers to release information regarding their care.    Consent: Patient/Guardian gives verbal consent for treatment and assignment of benefits for services provided during this visit. Patient/Guardian expressed understanding and agreed to proceed.        I discussed the assessment and treatment plan with the patient. The patient was provided an opportunity to ask questions and all were answered. The patient agreed with the plan and demonstrated an understanding of the instructions.   The patient was advised to call back or seek an in-person evaluation if the symptoms worsen or if the condition fails to improve as anticipated.   I provided 30 minutes of non-face-to-face time during this encounter.   [name], LCSW   01/06/2023

## 2023-01-18 ENCOUNTER — Other Ambulatory Visit: Payer: Self-pay

## 2023-01-18 ENCOUNTER — Telehealth (INDEPENDENT_AMBULATORY_CARE_PROVIDER_SITE_OTHER): Payer: HMO | Admitting: Psychiatry

## 2023-01-18 ENCOUNTER — Other Ambulatory Visit (HOSPITAL_COMMUNITY): Payer: Self-pay

## 2023-01-18 ENCOUNTER — Telehealth: Payer: Self-pay

## 2023-01-18 ENCOUNTER — Encounter: Payer: Self-pay | Admitting: Psychiatry

## 2023-01-18 DIAGNOSIS — Z634 Disappearance and death of family member: Secondary | ICD-10-CM | POA: Diagnosis not present

## 2023-01-18 DIAGNOSIS — F5105 Insomnia due to other mental disorder: Secondary | ICD-10-CM | POA: Diagnosis not present

## 2023-01-18 DIAGNOSIS — F411 Generalized anxiety disorder: Secondary | ICD-10-CM | POA: Diagnosis not present

## 2023-01-18 DIAGNOSIS — F3176 Bipolar disorder, in full remission, most recent episode depressed: Secondary | ICD-10-CM

## 2023-01-18 DIAGNOSIS — G3184 Mild cognitive impairment, so stated: Secondary | ICD-10-CM

## 2023-01-18 DIAGNOSIS — Z91199 Patient's noncompliance with other medical treatment and regimen due to unspecified reason: Secondary | ICD-10-CM

## 2023-01-18 DIAGNOSIS — G2581 Restless legs syndrome: Secondary | ICD-10-CM | POA: Diagnosis not present

## 2023-01-18 MED ORDER — CLONAZEPAM 0.5 MG PO TABS
0.5000 mg | ORAL_TABLET | ORAL | 1 refills | Status: DC
  Filled 2023-01-18 – 2023-01-19 (×3): qty 6, 14d supply, fill #0

## 2023-01-18 MED ORDER — DIVALPROEX SODIUM ER 500 MG PO TB24
1000.0000 mg | ORAL_TABLET | Freq: Every day | ORAL | 0 refills | Status: DC
  Filled 2023-01-18 (×2): qty 180, 90d supply, fill #0

## 2023-01-18 MED ORDER — DIVALPROEX SODIUM ER 250 MG PO TB24
250.0000 mg | ORAL_TABLET | Freq: Every day | ORAL | 0 refills | Status: DC
  Filled 2023-01-18 (×2): qty 90, 90d supply, fill #0

## 2023-01-18 MED ORDER — ESCITALOPRAM OXALATE 10 MG PO TABS
15.0000 mg | ORAL_TABLET | Freq: Every day | ORAL | 0 refills | Status: DC
  Filled 2023-01-18 (×2): qty 135, 90d supply, fill #0

## 2023-01-18 MED ORDER — TRAZODONE HCL 100 MG PO TABS
100.0000 mg | ORAL_TABLET | Freq: Every evening | ORAL | 1 refills | Status: DC | PRN
  Filled 2023-01-18 (×2): qty 180, 90d supply, fill #0
  Filled 2023-06-27: qty 180, 90d supply, fill #1

## 2023-01-18 NOTE — Telephone Encounter (Signed)
Please contact [facility] to cancel or transfer scripts to [facility], Please let me know.

## 2023-01-18 NOTE — Telephone Encounter (Signed)
Thank you :)

## 2023-01-18 NOTE — Telephone Encounter (Signed)
called pharmacy and they state that they got the info and that they are getting rx ready to mail out tomorrow.

## 2023-01-18 NOTE — Progress Notes (Signed)
Virtual Visit via Video Note  I connected with Stacy Chambers on 01/18/23 at  9:30 AM EDT by a video enabled telemedicine application and verified that I am speaking with the correct person using two identifiers.  Location Provider Location : Remote office Patient Location : Home  Participants: Patient , Provider   I discussed the limitations of evaluation and management by telemedicine and the availability of in person appointments. The patient expressed understanding and agreed to proceed.   I discussed the assessment and treatment plan with the patient. The patient was provided an opportunity to ask questions and all were answered. The patient agreed with the plan and demonstrated an understanding of the instructions.   The patient was advised to call back or seek an in-person evaluation if the symptoms worsen or if the condition fails to improve as anticipated.                                                               [facility] MD OP Progress Note  01/18/2023 10:47 AM Stacy Chambers  MRN:  [id number]  Chief Complaint:  Chief Complaint  Patient presents with   Medication Refill   Follow-up   Anxiety   Manic Behavior   HPI: Stacy Chambers is a 57 year old Caucasian female, lives in [place], has a history of bipolar disorder, GAD, insomnia, bulimia, RLS, MCI, memory loss, vitamin B12 deficiency was evaluated by telemedicine today.  Patient today reports she is currently struggling with upper respiratory tract infection symptoms, sore throat, congestion, sinus pressure, fatigue, headache, going on since the past several days.  Patient reports she does have upcoming appointment with her primary care provider and is motivated to keep it.  Patient reports she does feel ' her nerves are giving her trouble'.  When advised to elaborate patient reports she is often anxious, fidgety and restless.  Patient however throughout the session appeared to be laughing, pleasant.   When patient  was advised to clarify all the medications/psychotropic she was on with the dosages patient reported that she is not currently taking the Lexapro, ran out, also not compliant on the Wellbutrin.  She does know she is taking the Depakote.  Uses the trazodone as needed.  Reports she had to change her pharmacy to [facility].  Would like all her medications sent out there.  Patient agreeable to restart Lexapro, will restart at a lower dose to 15 mg given her bipolar diagnosis.  Patient is currently compliant with psychotherapy, had recent appointment with Mr. Maye Hides.  I have reviewed notes.  Patient denies any suicidality, homicidality or perceptual disturbances.  Patient denies any other concerns today.    Visit Diagnosis:    ICD-10-CM   1. Bipolar disorder, in full remission, most recent episode depressed (HCC)  F31.76 divalproex (DEPAKOTE ER) 250 MG 24 hr tablet   Mild    2. GAD (generalized anxiety disorder)  F41.1 divalproex (DEPAKOTE ER) 500 MG 24 hr tablet    traZODone (DESYREL) 100 MG tablet    escitalopram (LEXAPRO) 10 MG tablet    3. Insomnia due to mental disorder  F51.05    anxiety    4. Bereavement  Z63.4     5. RLS (restless legs syndrome)  G25.81 clonazePAM (KLONOPIN) 0.5  MG tablet    6. MCI (mild cognitive impairment)  G31.84     7. Noncompliance with treatment plan  Z91.199       Past Psychiatric History: I have reviewed past psychiatric history from progress note on 10/12/2018.  Past trials of medications like BuSpar, Zoloft, carbamazepine,vraylar, Zyprexa,wellbutrin (noncompliant) Lamictal, Lexapro, gabapentin, risperidone, Geodon.  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   Chronic obstructive pulmonary disease ([place]) 10/03/2015   COPD (chronic obstructive pulmonary disease) (HCC)    no inhalers   Elevated liver function tests 11/26/2004   H/O NEGATIVE LIVER BIOPSY, workup negative   Gallstones 02/21/2015   GERD (gastroesophageal reflux disease)    Hyperlipidemia    Insomnia    Lower back pain    Osteoarthritis    Ovarian cancer ([place]) 1995   RLS (restless legs syndrome)    Sedative, hypnotic or anxiolytic dependence with withdrawal, 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[place])    Wears dentures    full upper and lower    Past Surgical History:  Procedure Laterality Date   ABDOMINAL HYSTERECTOMY     APPENDECTOMY     BREAST BIOPSY Left 03/12/2015   fat necrosis   CESAREAN SECTION     COLONOSCOPY WITH PROPOFOL N/A 05/30/2018   Procedure: COLONOSCOPY WITH PROPOFOL;  Surgeon: Manya Silvas, MD;  [facility] END[facility]COPY;  Service: Endoscopy;  Laterality: N/A;   EXCISION MORTON'S NEUROMA Left 10/07/2016   Procedure: EXCISION MORTON'S NEUROMA;  Surgeon: [name], DPM;  Location: [facility];  Service: Podiatry;  Laterality: Left;  IV WITH LOCAL   LIVER BIOPSY  2005   PerPt: Done to eval elevated LFTs and biopsy provided no definite dx/cause of elevated LFTs   MULTIPLE TOOTH EXTRACTIONS     OOPHORECTOMY     OSTECTOMY Right 04/10/2020   Procedure: DOUBLE OSTEOTOMY GENERAL W/ LOCAL;  Surgeon: [name], DPM;  Location: [facility];  Service: Podiatry;  Laterality: Right;    Family Psychiatric History: I have reviewed family psychiatric history from progress note on 10/12/2018.  Family History:  Family History  Adopted: Yes  Problem Relation Age of Onset   COPD Mother    Depression Mother    Colon cancer Mother        unsure of age   48 Father        Lung CA, Skin Cancer--?type   COPD Father    Stroke Father    Cancer Sister 14       cervical cancer   COPD Brother    Alcohol abuse  Brother    Stroke Brother    COPD Maternal Aunt    Depression Daughter    Schizophrenia Son    Breast cancer Neg Hx     Social History: Reviewed social history from progress note on 10/12/2018. Social History   Socioeconomic History   Marital status: Widowed    Spouse name: Not on file   Number of children: Not on file   Years of education: Not on file   Highest education level: Not on file  Occupational History   Not on file  Tobacco Use   Smoking status: Former    Packs/day: 1.00    Years: 30.00    Additional pack years: 0.00    Total pack years: 30.00    Types: Cigarettes    Quit date: 04/08/2009    Years since quitting: 13.7   Smokeless tobacco: Never  Vaping Use   Vaping Use: Never used  Substance and Sexual Activity   Alcohol use: Not Currently    Alcohol/week: 3.0 - 7.0 standard drinks of alcohol    Types: 3 - 7 Shots of liquor per week    Comment: occasionally   Drug use: Not Currently    Types: Marijuana    Comment: told to refrain until after surgery   Sexual activity: Yes    Birth control/protection: Surgical  Other Topics Concern   Not on file  Social History Narrative   Not on file   Social Determinants of Health   Financial Resource Strain: Low Risk  (03/13/2021)   Overall Financial Resource Strain (CARDIA)    Difficulty of Paying Living Expenses: Not very hard  Food Insecurity: Not on file  Transportation Needs: Not on file  Physical Activity: Not on file  Stress: Not on file  Social Connections: Not on file    Allergies:  Allergies  Allergen Reactions   Benadryl [Diphenhydramine Hcl]     Hives/ rapid heartrate   Codeine     dizziness   Vicodin [Hydrocodone-Acetaminophen] Other (See Comments)    dizziness    Metabolic Disorder Labs: Lab Results  Component Value Date   HGBA1C 5.4 10/30/2022   MPG 114 09/14/2018   No results found for: "PROLACTIN" Lab Results  Component Value Date   CHOL 172 10/30/2022   TRIG 205 (H) 10/30/2022    HDL 55 10/30/2022   CHOLHDL 3.1 10/30/2022   VLDL 18 03/04/2015   LDLCALC 83 10/30/2022   LDLCALC 56 03/30/2022   Lab Results  Component Value Date   TSH 1.700 10/30/2022   TSH 2.600 03/30/2022    Therapeutic Level Labs: Lab Results  Component Value Date   LITHIUM 0.3 (L) 10/24/2018   LITHIUM 0.49 (L) 01/13/2009   Lab Results  Component Value Date   VALPROATE 70 11/11/2022   VALPROATE 29 (L) 06/02/2022   No results found for: "CBMZ"  Current Medications: Current Outpatient Medications  Medication Sig Dispense Refill   escitalopram (LEXAPRO) 10 MG tablet Take 1.5 tablets (15 mg total) by mouth daily with supper. 135 tablet 0   clonazePAM (KLONOPIN) 0.5 MG tablet Take 1 tablet (0.5 mg total) by mouth 2 to 3  times a week as directed for severe anxiety attacks. 6 tablet 1   divalproex (DEPAKOTE ER) 250 MG 24 hr tablet Take 1 tablet (250 mg total) by mouth daily along with 1000 mg for 1250 mgd daily. 90 tablet 0   divalproex (DEPAKOTE ER) 500 MG 24 hr tablet Take 2 tablets (1,000 mg total) by mouth daily. Take along with 250 mg daily - total of 1250 mg 180 tablet 0   esomeprazole (NEXIUM) 20 MG capsule Take 1 capsule (20 mg total) by mouth daily as needed (take on empty stomach). 90 capsule 1   fluticasone (FLONASE) 50 MCG/ACT nasal spray Place 2 sprays into both nostrils daily. 16 mL 2   hydrochlorothiazide (HYDRODIURIL) 12.5 MG tablet Take 1 tablet (12.5 mg total) by mouth daily. 90 tablet 1   hydrOXYzine (ATARAX) 50 MG tablet TAKE 1 TABLET TWICE DAILY FOR SEVERE ANXIETY ATTACKS AS NEEDED  180 tablet 1   meloxicam (MOBIC) 15 MG tablet Take 1 tablet (15 mg total) by mouth daily. 90 tablet 1   methocarbamol (ROBAXIN) 500 MG tablet TAKE 1 TABLET EVERY DAY 60 tablet 3   rosuvastatin (CRESTOR) 10 MG tablet Take 1 tablet (10 mg total) by mouth daily. 90 tablet 1   traZODone (DESYREL) 100 MG tablet Take 1-2 tablets (100-200 mg total) by mouth at bedtime as needed for sleep. 180 tablet  1   Vitamin D, Ergocalciferol, (DRISDOL) 1.25 MG (50000 UNIT) CAPS capsule Take 1 capsule (50,000 Units total) by mouth every 7 (seven) days. 10 capsule 3   No current facility-administered medications for this visit.     Musculoskeletal: Strength & Muscle Tone:  UTA Gait & Station:  Seated Patient leans: N/A  Psychiatric Specialty Exam: Review of Systems  Constitutional:  Positive for fatigue.  HENT:  Positive for congestion, sinus pressure and sore throat.   Psychiatric/Behavioral:  The patient is nervous/anxious.   All other systems reviewed and are negative.   There were no vitals taken for this visit.There is no height or weight on file to calculate BMI.  General Appearance: Casual  Eye Contact:  Good  Speech:  Clear and Coherent  Volume:  Normal  Mood:  Anxious  Affect:  Full Range  Thought Process:  Goal Directed and Descriptions of Associations: Intact  Orientation:  Full (Time, Place, and Person)  Thought Content: Logical   Suicidal Thoughts:  No  Homicidal Thoughts:  No  Memory:  Immediate;   Fair Recent;   Fair Remote;   Fair  Judgement:  Fair  Insight:  Fair  Psychomotor Activity:  Normal  Concentration:  Concentration: Fair and Attention Span: Fair  Recall:  [employer] of Knowledge: Fair  Language: Fair  Akathisia:  No  Handed:  Left  AIMS (if indicated): not done  Assets:  Communication Skills Desire for Improvement Housing Intimacy Social Support Transportation  ADL's:  Intact  Cognition: WNL  Sleep:  Fair   Screenings: Administrator, Civil [employer] Office Visit from 11/23/2022 in [facility] Video Visit from 06/01/2022 in [facility] Video Visit from 03/24/2022 in [facility] Video Visit from 06/04/2021 in [place] Office Visit from 01/28/2021 in [place] Total Score 0 0 0 0 0      [place] Office Visit from 11/23/2022 in [facility] Office Visit from 10/30/2022 in [facility] Office Visit from 07/29/2022 in [facility] from 03/03/2022 in [facility] [place] from 01/13/2022 in [facility]  Total GAD-7 Score 2 0 8 15 7       PHQ2-9    [place] Office Visit from 11/23/2022 in [facility] Office Visit from 10/30/2022 in [facility] Video Visit from 08/24/2022 in [facility] Office Visit from 07/29/2022 in [facility] Office Visit from 06/26/2022 in [facility]  PHQ-2 Total Score 0 0 0 2 0  PHQ-9 Total Score -- 0 2 9 --      [place] Office Visit from 11/23/2022 in [place] Video Visit from 08/24/2022 in [facility] Video Visit from 06/01/2022 in [facility]  C-SSRS RISK CATEGORY No Risk Low Risk Low Risk        Assessment and Plan: Stacy Chambers is a 57 year old Caucasian female, widowed, lives in [place], has a history of bipolar disorder, anxiety disorder, COPD, RLS, vitamin B12 deficiency was evaluated by telemedicine today.  Patient is currently struggling with anxiety, noncompliance with multiple medications/psychotropics.  Patient will benefit from the following plan.  Plan Bipolar disorder in remission Depakote ER 1250 mg p.o. daily Trazodone 100-200 mg p.o. nightly as needed Hydroxyzine 50 mg at bedtime as needed for sleep as well.  GAD-unstable Likely due to noncompliance. Restart Lexapro at 15 mg p.o. daily with supper Patient to  continue CBT with Mr. Maye Hides. Klonopin 0.5 mg up to 2-3 times a week only for severe anxiety attacks, patient advised to limit use. Reviewed [place] PMP AWARxE.  Bereavement-improving Continue CBT  RLS-stable Will monitor closely  Noncompliance with medications-unstable Provided education.  Patient has been noncompliant with multiple psychotropics including Lexapro, Wellbutrin.  Patient was restarted on Lexapro due to her anxiety being worse.  History of MCI-patient was referred to neurology in the past.   Follow-up in clinic in 1 month or sooner in person.   Collaboration of Care: Collaboration of Care: Other I have reviewed notes per Mr. Maye Hides dated 01/06/2023-currently continuing working on coping skills, coping with her mood symptoms.  Patient/Guardian was advised Release of Information must be obtained prior to any record release in order to collaborate their care with an outside provider. Patient/Guardian was advised if they have not already done so to contact the registration department to sign all necessary forms in order for [name] to release information regarding their care.   Consent: Patient/Guardian gives verbal consent for treatment and assignment of benefits for services provided during this visit. Patient/Guardian expressed understanding and agreed to proceed.  This note was generated in part or whole with voice recognition software. Voice recognition is usually quite accurate but there are transcription errors that can and very often do occur. I apologize for any typographical errors that were not detected and corrected.     Ursula Alert, MD 01/18/2023, 10:47 AM

## 2023-01-18 NOTE — Telephone Encounter (Signed)
pt called left a message that she gave you the wrong pharmacy. it was supposed to be the [facility] long outpatient pharmacy

## 2023-01-18 NOTE — Telephone Encounter (Signed)
pt notified that pharmacy received and getting rx ready to mail out tomorrow.

## 2023-01-19 ENCOUNTER — Other Ambulatory Visit (HOSPITAL_COMMUNITY): Payer: Self-pay

## 2023-01-19 ENCOUNTER — Other Ambulatory Visit: Payer: Self-pay

## 2023-01-29 ENCOUNTER — Ambulatory Visit: Payer: Medicare HMO | Admitting: Family Medicine

## 2023-02-02 ENCOUNTER — Ambulatory Visit (INDEPENDENT_AMBULATORY_CARE_PROVIDER_SITE_OTHER): Payer: HMO | Admitting: Clinical

## 2023-02-02 DIAGNOSIS — F5105 Insomnia due to other mental disorder: Secondary | ICD-10-CM

## 2023-02-02 DIAGNOSIS — R41840 Attention and concentration deficit: Secondary | ICD-10-CM | POA: Diagnosis not present

## 2023-02-02 DIAGNOSIS — F411 Generalized anxiety disorder: Secondary | ICD-10-CM | POA: Diagnosis not present

## 2023-02-02 DIAGNOSIS — F3131 Bipolar disorder, current episode depressed, mild: Secondary | ICD-10-CM | POA: Diagnosis not present

## 2023-02-02 NOTE — Progress Notes (Signed)
Virtual Visit via Video Note   I connected with Stacy Chambers on 02/02/23 at  8:00 AM EST by a video enabled telemedicine application and verified that I am speaking with the correct person using two identifiers.   Location: Patient: Home Provider: Office   I discussed the limitations of evaluation and management by telemedicine and the availability of in person appointments. The patient expressed understanding and agreed to proceed.     THERAPIST PROGRESS NOTE   Session Time: 8:00 AM-8:30 AM   Participation Level: Active   Behavioral Response: CasualAlertDepressed   Type of Therapy: Individual Therapy   Treatment Goals addressed: Coping   Interventions: CBT   Summary: Stacy Chambers is a 57 y.o. female who presents with Bipolar Disorder./ GAD/Insomnia. The OPT therapist worked with the patient for her ongoing OPT treatment session. The OPT therapist utilized Motivational Interviewing to assist in creating therapeutic repore. The patient in the session was engaged and work in collaboration giving feedback about her triggers and symptoms over the past few weeks.The patient spoke about difficulty with allergies and has been recovering from being under the weather and has a appointment with her PCP on Friday. The patient noted her live in tenants had to leave they were not cleaning up after themselves are doing there part financially. The OPT therapist utilized Cognitive Behavioral Therapy through cognitive restructuring as well as worked with the patient on coping strategies to assist in management of mood. The OPT therapist worked with the patient on continuing to maintain her basic needs of self care with eating, hygiene, and sleep. The patient notes she is doing well with her interactions with others in the home.The patient spoke about her current relationship and noted things are going well still. The patient spoke about not immediately going back to subleasing and just using the extra  space for storage and her granddaughter.    Suicidal/Homicidal: Nowithout intent/plan   Therapist Response: The OPT therapist worked with the patient for the patients scheduled session. The patient was engaged in her session and gave feedback in relation to triggers, symptoms, and behavior responses over the past few weeks. The patient spoke about her tenants leaving by 02/18/2024 and her getting her space back The patient spoke about relationship continuing to go well and she is currently living with her new boyfriend. The OPT therapist worked with the patient utilizing an in session Cognitive Behavioral Therapy exercise. The patient was responsive in the session and verbalized," Things are going good with my boyfriend I feel more alive in this new relationship... The tenants I had will be out of the home by 02/18/2023". The OPT therapist worked with the patient on staying active , managing her basic health needs, and being consistent with her medication therapy plan. The OPT therapist worked with the patient gauging activity level, coping skill implementation, and mood. The patient spoke about making plans for vacationing with the boyfriend at the end of May.The OPT therapist will continue treatment work with the patient in her next scheduled session    Plan: Return again in 3 weeks.   Diagnosis:      Axis I: Bipolar 1 disorder,depressed mild/GAD/Insomnia/ Attention deficit.                             Axis II: No diagnosis    Collaboration of Care: Collaboration of care reviewed with medication therapy involvement with psychiatrist Dr. Elna Breslow.   Patient/Guardian was  advised Release of Information must be obtained prior to any record release in order to collaborate their care with an outside provider. Patient/Guardian was advised if they have not already done so to contact the registration department to sign all necessary forms in order for [name] to release information regarding their care.    Consent:  Patient/Guardian gives verbal consent for treatment and assignment of benefits for services provided during this visit. Patient/Guardian expressed understanding and agreed to proceed.        I discussed the assessment and treatment plan with the patient. The patient was provided an opportunity to ask questions and all were answered. The patient agreed with the plan and demonstrated an understanding of the instructions.   The patient was advised to call back or seek an in-person evaluation if the symptoms worsen or if the condition fails to improve as anticipated.   I provided 30 minutes of non-face-to-face time during this encounter.   [name], LCSW   02/02/2023

## 2023-02-05 ENCOUNTER — Ambulatory Visit (INDEPENDENT_AMBULATORY_CARE_PROVIDER_SITE_OTHER): Payer: HMO | Admitting: Family Medicine

## 2023-02-05 ENCOUNTER — Encounter: Payer: Self-pay | Admitting: Family Medicine

## 2023-02-05 VITALS — BP 136/83 | HR 87 | Ht 64.0 in | Wt 179.0 lb

## 2023-02-05 DIAGNOSIS — H65192 Other acute nonsuppurative otitis media, left ear: Secondary | ICD-10-CM | POA: Diagnosis not present

## 2023-02-05 DIAGNOSIS — M25552 Pain in left hip: Secondary | ICD-10-CM

## 2023-02-05 DIAGNOSIS — R1084 Generalized abdominal pain: Secondary | ICD-10-CM | POA: Diagnosis not present

## 2023-02-05 MED ORDER — AMOXICILLIN-POT CLAVULANATE 875-125 MG PO TABS
1.0000 | ORAL_TABLET | Freq: Two times a day (BID) | ORAL | 0 refills | Status: DC
  Filled 2023-02-05: qty 10, 5d supply, fill #0

## 2023-02-05 MED ORDER — PANTOPRAZOLE SODIUM 20 MG PO TBEC
20.0000 mg | DELAYED_RELEASE_TABLET | Freq: Every day | ORAL | 1 refills | Status: DC
  Filled 2023-02-05: qty 30, 30d supply, fill #0

## 2023-02-05 NOTE — Progress Notes (Unsigned)
Established Patient Office Visit  Subjective:  Patient ID: Stacy Chambers, female    DOB: August 03, 1966  Age: 57 y.o. MRN: [id number]  CC:  Chief Complaint  Patient presents with   Follow-up    Pt reports abdominal pain due to alcohol consumption this week, pt reports left buttock pain since 01/05/2023, concerned about osteoporosis flare up.  Also reports left ear pain on and off.     HPI Stacy Chambers is a 57 y.o. female with past medical history of *** presents for f/u of *** chronic medical conditions.  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   Chronic obstructive pulmonary disease 10/03/2015   COPD (chronic obstructive pulmonary disease)    no inhalers   Elevated liver function tests 11/26/2004   H/O NEGATIVE LIVER BIOPSY, workup negative   Gallstones 02/21/2015   GERD (gastroesophageal reflux disease)    Hyperlipidemia    Insomnia    Lower back pain    Osteoarthritis    Ovarian cancer 1995   RLS (restless legs syndrome)    Sedative, hypnotic or anxiolytic dependence with withdrawal, unspecified    Wears dentures    full upper and lower    Past Surgical History:  Procedure Laterality Date   ABDOMINAL HYSTERECTOMY     APPENDECTOMY     BREAST BIOPSY Left 03/12/2015   fat necrosis   CESAREAN SECTION     COLONOSCOPY WITH PROPOFOL N/A 05/30/2018   Procedure: COLONOSCOPY WITH PROPOFOL;  Surgeon: Scot Jun, MD;  Location: [facility] ENDOSCOPY;  Service: Endoscopy;  Laterality: N/A;   EXCISION MORTON'S NEUROMA Left 10/07/2016   Procedure: EXCISION MORTON'S NEUROMA;  Surgeon: Gwyneth Revels, DPM;  Location: [facility];  Service: Podiatry;  Laterality: Left;  IV WITH LOCAL   LIVER BIOPSY   2005   PerPt: Done to eval elevated LFTs and biopsy provided no definite dx/cause of elevated LFTs   MULTIPLE TOOTH EXTRACTIONS     OOPHORECTOMY     OSTECTOMY Right 04/10/2020   Procedure: DOUBLE OSTEOTOMY GENERAL W/ LOCAL;  Surgeon: Gwyneth Revels, DPM;  Location: [facility];  Service: Podiatry;  Laterality: Right;    Family History  Adopted: Yes  Problem Relation Age of Onset   COPD Mother    Depression Mother    Colon cancer Mother        unsure of age   Cancer Father        Lung CA, Skin Cancer--?type   COPD Father    Stroke Father    Cancer Sister 63       cervical cancer   COPD Brother    Alcohol abuse Brother    Stroke Brother    COPD Maternal Aunt    Depression Daughter    Schizophrenia Son    Breast cancer Neg Hx     Social History   Socioeconomic History   Marital status: Widowed    Spouse name: Not on file   Number of children: Not on file   Years of education: Not on file   Highest education level: 10th grade  Occupational History   Not on file  Tobacco Use  Smoking status: Former    Packs/day: 1.00    Years: 30.00    Additional pack years: 0.00    Total pack years: 30.00    Types: Cigarettes    Quit date: 04/08/2009    Years since quitting: 13.8   Smokeless tobacco: Never  Vaping Use   Vaping Use: Never used  Substance and Sexual Activity   Alcohol use: Not Currently    Alcohol/week: 3.0 - 7.0 standard drinks of alcohol    Types: 3 - 7 Shots of liquor per week    Comment: occasionally   Drug use: Not Currently    Types: Marijuana    Comment: told to refrain until after surgery   Sexual activity: Yes    Birth control/protection: Surgical  Other Topics Concern   Not on file  Social History Narrative   Not on file   Social Determinants of Health   Financial Resource Strain: Medium Risk (02/02/2023)   Overall Financial Resource Strain (CARDIA)    Difficulty of Paying Living Expenses: Somewhat hard  Food Insecurity: Food Insecurity  Present (02/02/2023)   Hunger Vital Sign    Worried About Running Out of Food in the Last Year: Sometimes true    Ran Out of Food in the Last Year: Sometimes true  Transportation Needs: No Transportation Needs (02/02/2023)   PRAPARE - Administrator, Civil Service (Medical): No    Lack of Transportation (Non-Medical): No  Physical Activity: Sufficiently Active (02/02/2023)   Exercise Vital Sign    Days of Exercise per Week: 3 days    Minutes of Exercise per Session: 100 min  Stress: Stress Concern Present (02/02/2023)   [employer] of Occupational Health - Occupational Stress Questionnaire    Feeling of Stress : To some extent  Social Connections: Unknown (02/02/2023)   Social Connection and Isolation Panel [NHANES]    Frequency of Communication with Friends and Family: Patient declined    Frequency of Social Gatherings with Friends and Family: Patient declined    Attends Religious Services: Never    Database administrator or Organizations: No    Attends Engineer, structural: Not on file    Marital Status: Widowed  Intimate Partner Violence: Not on file    Outpatient Medications Prior to Visit  Medication Sig Dispense Refill   clonazePAM (KLONOPIN) 0.5 MG tablet Take 1 tablet (0.5 mg total) by mouth 2 to 3  times a week as directed for severe anxiety attacks. 6 tablet 1   divalproex (DEPAKOTE ER) 250 MG 24 hr tablet Take 1 tablet (250 mg total) by mouth daily along with 1000 mg for 1250 mg daily. 90 tablet 0   divalproex (DEPAKOTE ER) 500 MG 24 hr tablet Take 2 tablets (1,000 mg total) by mouth daily. Take along with 250 mg daily - total of 1250 mg 180 tablet 0   escitalopram (LEXAPRO) 10 MG tablet Take 1.5 tablets (15 mg total) by mouth daily with supper. 135 tablet 0   esomeprazole (NEXIUM) 20 MG capsule Take 1 capsule (20 mg total) by mouth daily as needed (take on empty stomach). 90 capsule 1   fluticasone (FLONASE) 50 MCG/ACT nasal spray Place 2 sprays into both  nostrils daily. 16 mL 2   hydrOXYzine (ATARAX) 50 MG tablet TAKE 1 TABLET TWICE DAILY FOR SEVERE ANXIETY ATTACKS AS NEEDED 180 tablet 1   meloxicam (MOBIC) 15 MG tablet Take 1 tablet (15 mg total) by mouth daily. 90 tablet 1   methocarbamol (  ROBAXIN) 500 MG tablet TAKE 1 TABLET EVERY DAY 60 tablet 3   traZODone (DESYREL) 100 MG tablet Take 1-2 tablets (100-200 mg total) by mouth at bedtime as needed for sleep. 180 tablet 1   Vitamin D, Ergocalciferol, (DRISDOL) 1.25 MG (50000 UNIT) CAPS capsule Take 1 capsule (50,000 Units total) by mouth every 7 (seven) days. 10 capsule 3   hydrochlorothiazide (HYDRODIURIL) 12.5 MG tablet Take 1 tablet (12.5 mg total) by mouth daily. 90 tablet 1   rosuvastatin (CRESTOR) 10 MG tablet Take 1 tablet (10 mg total) by mouth daily. 90 tablet 1   No facility-administered medications prior to visit.    Allergies  Allergen Reactions   Benadryl [Diphenhydramine Hcl]     Hives/ rapid heartrate   Codeine     dizziness   Vicodin [Hydrocodone-Acetaminophen] Other (See Comments)    dizziness    ROS Review of Systems    Objective:    Physical Exam  BP 136/83   Pulse 87   Ht  (1.626 m)   Wt 179 lb (81.2 kg)   SpO2 95%   BMI 30.73 kg/m  Wt Readings from Last 3 Encounters:  02/05/23 179 lb (81.2 kg)  10/30/22 171 lb 1.3 oz (77.6 kg)  07/29/22 164 lb 1.9 oz (74.4 kg)    Lab Results  Component Value Date   TSH 1.700 10/30/2022   Lab Results  Component Value Date   WBC 7.0 10/30/2022   HGB 12.2 10/30/2022   HCT 35.5 10/30/2022   MCV 90 10/30/2022   PLT 181 10/30/2022   Lab Results  Component Value Date   NA 138 10/30/2022   K 4.0 10/30/2022   CO2 25 10/30/2022   GLUCOSE 93 10/30/2022   BUN 16 10/30/2022   CREATININE 0.67 10/30/2022   BILITOT 0.3 10/30/2022   ALKPHOS 77 10/30/2022   AST 18 10/30/2022   ALT 16 10/30/2022   PROT 7.4 10/30/2022   ALBUMIN 4.9 10/30/2022   CALCIUM 10.2 10/30/2022   EGFR 102 10/30/2022   Lab Results   Component Value Date   CHOL 172 10/30/2022   Lab Results  Component Value Date   HDL 55 10/30/2022   Lab Results  Component Value Date   LDLCALC 83 10/30/2022   Lab Results  Component Value Date   TRIG 205 (H) 10/30/2022   Lab Results  Component Value Date   CHOLHDL 3.1 10/30/2022   Lab Results  Component Value Date   HGBA1C 5.4 10/30/2022      Assessment & Plan:  There are no diagnoses linked to this encounter.  Follow-up: No follow-ups on file.   Gilmore Laroche, FNP

## 2023-02-05 NOTE — Patient Instructions (Addendum)
I appreciate the opportunity to provide care to you today!    Follow up:  1 month  Otitis media A prescription for Augmentin has been sent to your pharmacy to start taking for an ear infection  Left hip pain Likely due to arthritis in those joints I recommend taking over-the-counter extra strength Tylenol for pain relief Please stop by in the hospital get a x-ray of your left hip   Abdominal pain due to excessive alcohol intake Symptom likely due to gastritis A prescription for Protonix is sent to your pharmacy Please avoid all NSAID products currently    Please stop by [facility] hospital anytime to get an x-ray of  the  left hip     Please continue to a heart-healthy diet and increase your physical activities. Try to exercise for at least five days a week.      It was a pleasure to see you and I look forward to continuing to work together on your health and well-being. Please do not hesitate to call the office if you need care or have questions about your care.   Have a wonderful day and week. With Gratitude, Gilmore Laroche MSN, FNP-BC

## 2023-02-06 ENCOUNTER — Other Ambulatory Visit (HOSPITAL_COMMUNITY): Payer: Self-pay

## 2023-02-06 DIAGNOSIS — R1084 Generalized abdominal pain: Secondary | ICD-10-CM | POA: Insufficient documentation

## 2023-02-06 DIAGNOSIS — M25552 Pain in left hip: Secondary | ICD-10-CM | POA: Insufficient documentation

## 2023-02-06 DIAGNOSIS — H6692 Otitis media, unspecified, left ear: Secondary | ICD-10-CM | POA: Insufficient documentation

## 2023-02-06 MED ORDER — METHYLPREDNISOLONE 4 MG PO TBPK
ORAL_TABLET | ORAL | 0 refills | Status: DC
  Filled 2023-02-06: qty 21, fill #0

## 2023-02-06 NOTE — Assessment & Plan Note (Signed)
Will treat today with Augmentin twice daily for 5 days

## 2023-02-06 NOTE — Assessment & Plan Note (Signed)
Symptoms likely gastritis We will start her on a trial of Protonix today Encouraged the patient to refrain from NSAID products including meloxicam

## 2023-02-06 NOTE — Assessment & Plan Note (Signed)
Symptoms likely of osteoarthritis of the hip radiating to the glute joint Symptoms could possibly be gluteal tendinopathy Encouraged to take extra strength Tylenol  Medrol Dosepak ordered Will get imaging studies of the hip Will consider a referral to physical therapy after reviewing the results of the imaging studies

## 2023-02-07 ENCOUNTER — Other Ambulatory Visit: Payer: Self-pay | Admitting: Family Medicine

## 2023-02-07 DIAGNOSIS — E559 Vitamin D deficiency, unspecified: Secondary | ICD-10-CM

## 2023-02-07 DIAGNOSIS — M25552 Pain in left hip: Secondary | ICD-10-CM

## 2023-02-07 DIAGNOSIS — H65192 Other acute nonsuppurative otitis media, left ear: Secondary | ICD-10-CM

## 2023-02-07 DIAGNOSIS — R1084 Generalized abdominal pain: Secondary | ICD-10-CM

## 2023-02-07 MED ORDER — PANTOPRAZOLE SODIUM 20 MG PO TBEC
20.0000 mg | DELAYED_RELEASE_TABLET | Freq: Every day | ORAL | 1 refills | Status: DC
  Filled 2023-02-07: qty 30, 30d supply, fill #0
  Filled 2023-05-11: qty 30, 30d supply, fill #1

## 2023-02-07 MED ORDER — AMOXICILLIN-POT CLAVULANATE 875-125 MG PO TABS
1.0000 | ORAL_TABLET | Freq: Two times a day (BID) | ORAL | 0 refills | Status: AC
  Filled 2023-02-07: qty 10, 5d supply, fill #0

## 2023-02-07 MED ORDER — VITAMIN D (ERGOCALCIFEROL) 1.25 MG (50000 UNIT) PO CAPS
50000.0000 [IU] | ORAL_CAPSULE | ORAL | 3 refills | Status: DC
  Filled 2023-02-07: qty 20, 140d supply, fill #0

## 2023-02-07 MED ORDER — VITAMIN D (ERGOCALCIFEROL) 1.25 MG (50000 UNIT) PO CAPS
50000.0000 [IU] | ORAL_CAPSULE | ORAL | 3 refills | Status: DC
  Filled 2023-02-07: qty 12, 84d supply, fill #0

## 2023-02-07 MED ORDER — METHYLPREDNISOLONE 4 MG PO TBPK
ORAL_TABLET | ORAL | 0 refills | Status: DC
  Filled 2023-02-07: qty 21, 6d supply, fill #0

## 2023-02-07 NOTE — Progress Notes (Unsigned)
erf

## 2023-02-08 ENCOUNTER — Ambulatory Visit (HOSPITAL_COMMUNITY)
Admission: RE | Admit: 2023-02-08 | Discharge: 2023-02-08 | Disposition: A | Discharge status: Home / Self Care | Payer: HMO | Source: Ambulatory Visit | Attending: Family Medicine | Admitting: Family Medicine

## 2023-02-08 ENCOUNTER — Other Ambulatory Visit: Payer: Self-pay

## 2023-02-08 ENCOUNTER — Other Ambulatory Visit (HOSPITAL_COMMUNITY): Payer: Self-pay

## 2023-02-08 DIAGNOSIS — M25552 Pain in left hip: Secondary | ICD-10-CM | POA: Diagnosis not present

## 2023-02-09 ENCOUNTER — Other Ambulatory Visit: Payer: Self-pay

## 2023-02-10 NOTE — Progress Notes (Signed)
Please inform the patient x-ray of the left hip shows no fracture or dislocation

## 2023-02-15 ENCOUNTER — Telehealth (INDEPENDENT_AMBULATORY_CARE_PROVIDER_SITE_OTHER): Payer: HMO | Admitting: Family Medicine

## 2023-02-15 ENCOUNTER — Encounter: Payer: Self-pay | Admitting: Family Medicine

## 2023-02-15 VITALS — Ht 64.0 in | Wt 171.0 lb

## 2023-02-15 DIAGNOSIS — H6692 Otitis media, unspecified, left ear: Secondary | ICD-10-CM

## 2023-02-15 NOTE — Progress Notes (Signed)
Virtual Visit via Video Note  I connected with Stacy Chambers on 02/15/23 at 11:00 AM EDT by a video enabled telemedicine application and verified that I am speaking with the correct person using two identifiers.  Patient Location: Home Provider Location: Office/Clinic  I discussed the limitations, risks, security, and privacy concerns of performing an evaluation and management service by video and the availability of in person appointments. I also discussed with the patient that there may be a patient responsible charge related to this service. The patient expressed understanding and agreed to proceed.  Subjective: PCP: Gilmore Laroche, FNP  Chief Complaint  Patient presents with   Ear Fullness    Pt reports completed course given at [facility], still complaining feeling of something being stuck in there.    HPI The patient is seen today with complaints of pain and decreased hearing her left ear.  She reports completing Augmentin with minimal relief of her symptoms.  ROS: Per HPI  Current Outpatient Medications:    clonazePAM (KLONOPIN) 0.5 MG tablet, Take 1 tablet (0.5 mg total) by mouth 2 to 3  times a week as directed for severe anxiety attacks., Disp: 6 tablet, Rfl: 1   divalproex (DEPAKOTE ER) 250 MG 24 hr tablet, Take 1 tablet (250 mg total) by mouth daily along with 1000 mg for 1250 mg daily., Disp: 90 tablet, Rfl: 0   divalproex (DEPAKOTE ER) 500 MG 24 hr tablet, Take 2 tablets (1,000 mg total) by mouth daily. Take along with 250 mg daily - total of 1250 mg, Disp: 180 tablet, Rfl: 0   escitalopram (LEXAPRO) 10 MG tablet, Take 1.5 tablets (15 mg total) by mouth daily with supper., Disp: 135 tablet, Rfl: 0   esomeprazole (NEXIUM) 20 MG capsule, Take 1 capsule (20 mg total) by mouth daily as needed (take on empty stomach)., Disp: 90 capsule, Rfl: 1   fluticasone (FLONASE) 50 MCG/ACT nasal spray, Place 2 sprays into both nostrils daily., Disp: 16 mL, Rfl: 2   hydrOXYzine (ATARAX) 50  MG tablet, TAKE 1 TABLET TWICE DAILY FOR SEVERE ANXIETY ATTACKS AS NEEDED, Disp: 180 tablet, Rfl: 1   meloxicam (MOBIC) 15 MG tablet, Take 1 tablet (15 mg total) by mouth daily., Disp: 90 tablet, Rfl: 1   methocarbamol (ROBAXIN) 500 MG tablet, TAKE 1 TABLET EVERY DAY, Disp: 60 tablet, Rfl: 3   methylPREDNISolone (MEDROL DOSEPAK) 4 MG TBPK tablet, Take as directed per package instructions., Disp: 21 each, Rfl: 0   pantoprazole (PROTONIX) 20 MG tablet, Take 1 tablet (20 mg total) by mouth daily., Disp: 30 tablet, Rfl: 1   traZODone (DESYREL) 100 MG tablet, Take 1-2 tablets (100-200 mg total) by mouth at bedtime as needed for sleep., Disp: 180 tablet, Rfl: 1   Vitamin D, Ergocalciferol, (DRISDOL) 1.25 MG (50000 UNIT) CAPS capsule, Take 1 capsule (50,000 Units total) by mouth every 7 (seven) days., Disp: 20 capsule, Rfl: 3   hydrochlorothiazide (HYDRODIURIL) 12.5 MG tablet, Take 1 tablet (12.5 mg total) by mouth daily., Disp: 90 tablet, Rfl: 1   rosuvastatin (CRESTOR) 10 MG tablet, Take 1 tablet (10 mg total) by mouth daily., Disp: 90 tablet, Rfl: 1  Observations/Objective: Today's Vitals   02/15/23 1055  Weight: 171 lb (77.6 kg)  Height:  (1.626 m)   Physical Exam  Assessment and Plan: Left otitis media, unspecified otitis media type   Will need to examine the patient's ears before making any changes recommendation Encouraged the patient to schedule an office visit today  to examine her ears in the clinic Follow Up Instructions: No follow-ups on file.  Patient is well-developed, well-nourished in no acute distress.  Resting comfortably at home.  Head is normocephalic, atraumatic.  No labored breathing.  Speech is clear and coherent with logical content.  Patient is alert and oriented at baseline.   I discussed the assessment and treatment plan with the patient. The patient was provided an opportunity to ask questions, and all were answered. The patient agreed with the plan and  demonstrated an understanding of the instructions.   The patient was advised to call back or seek an in-person evaluation if the symptoms worsen or if the condition fails to improve as anticipated.  The above assessment and management plan was discussed with the patient. The patient verbalized understanding of and has agreed to the management plan.   Gilmore Laroche, FNP

## 2023-02-15 NOTE — Progress Notes (Deleted)
Complete physical exam  Patient: Stacy Chambers   DOB: 21-Mar-1966   57 y.o. Female  MRN: [id number]  Subjective:    Chief Complaint  Patient presents with   Ear Fullness    Pt reports completed course given at [facility], still complaining feeling of something being stuck in there.     Stacy Chambers is a 57 y.o. female who presents today for a complete physical exam. She reports consuming a {diet types:17450} diet. {types:19826} She generally feels {DESC; WELL/FAIRLY WELL/POORLY:18703}. She reports sleeping {DESC; WELL/FAIRLY WELL/POORLY:18703}. She {does/does not:200015} have additional problems to discuss today.    Most recent fall risk assessment:    02/15/2023   10:56 AM  Fall Risk   Falls in the past year? 0  Number falls in past yr: 0  Injury with Fall? 0  Risk for fall due to : No Fall Risks  Follow up Falls evaluation completed     Most recent depression screenings:    02/15/2023   10:56 AM 02/05/2023    3:30 PM  PHQ 2/9 Scores  PHQ - 2 Score 0 0  PHQ- 9 Score 0 6    {VISON DENTAL STD PSA (Optional):27386}  {History (Optional):23778}  Patient Care Team: Gilmore Laroche, FNP as PCP - General (Family Medicine) Erroll Luna, [facility] as Pharmacist (Pharmacist)   Outpatient Medications Prior to Visit  Medication Sig   clonazePAM (KLONOPIN) 0.5 MG tablet Take 1 tablet (0.5 mg total) by mouth 2 to 3  times a week as directed for severe anxiety attacks.   divalproex (DEPAKOTE ER) 250 MG 24 hr tablet Take 1 tablet (250 mg total) by mouth daily along with 1000 mg for 1250 mg daily.   divalproex (DEPAKOTE ER) 500 MG 24 hr tablet Take 2 tablets (1,000 mg total) by mouth daily. Take along with 250 mg daily - total of 1250 mg   escitalopram (LEXAPRO) 10 MG tablet Take 1.5 tablets (15 mg total) by mouth daily with supper.   esomeprazole (NEXIUM) 20 MG capsule Take 1 capsule (20 mg total) by mouth daily as needed (take on empty stomach).   fluticasone (FLONASE) 50 MCG/ACT  nasal spray Place 2 sprays into both nostrils daily.   hydrOXYzine (ATARAX) 50 MG tablet TAKE 1 TABLET TWICE DAILY FOR SEVERE ANXIETY ATTACKS AS NEEDED   meloxicam (MOBIC) 15 MG tablet Take 1 tablet (15 mg total) by mouth daily.   methocarbamol (ROBAXIN) 500 MG tablet TAKE 1 TABLET EVERY DAY   methylPREDNISolone (MEDROL DOSEPAK) 4 MG TBPK tablet Take as directed per package instructions.   pantoprazole (PROTONIX) 20 MG tablet Take 1 tablet (20 mg total) by mouth daily.   traZODone (DESYREL) 100 MG tablet Take 1-2 tablets (100-200 mg total) by mouth at bedtime as needed for sleep.   Vitamin D, Ergocalciferol, (DRISDOL) 1.25 MG (50000 UNIT) CAPS capsule Take 1 capsule (50,000 Units total) by mouth every 7 (seven) days.   hydrochlorothiazide (HYDRODIURIL) 12.5 MG tablet Take 1 tablet (12.5 mg total) by mouth daily.   rosuvastatin (CRESTOR) 10 MG tablet Take 1 tablet (10 mg total) by mouth daily.   No facility-administered medications prior to visit.    ROS     Objective:    Ht  (1.626 m)   Wt 171 lb (77.6 kg)   BMI 29.35 kg/m  {Vitals History (Optional):23777}  Physical Exam  No results found for any visits on 02/15/23. {Show previous labs (optional):23779}    Assessment & Plan:  Routine Health Maintenance and Physical Exam  Immunization History  Administered Date(s) Administered   Influenza,inj,Quad PF,6+ Mos 06/26/2014, 06/14/2019, 09/16/2020, 06/26/2022   Influenza-Unspecified 09/14/2018   Tdap 11/22/2013    Health Maintenance  Topic Date Due   COVID-19 Vaccine (1) Never done   Zoster Vaccines- Shingrix (1 of 2) Never done   Lung Cancer Screening  07/11/2015   MAMMOGRAM  04/18/2023   Medicare Annual Wellness (AWV)  04/21/2023   INFLUENZA VACCINE  05/27/2023   COLONOSCOPY (Pts 45-84yrs Insurance coverage will need to be confirmed)  05/31/2023   DTaP/Tdap/Td (2 - Td or Tdap) 11/23/2023   Hepatitis C Screening  Completed   HIV Screening  Completed   HPV  VACCINES  Aged Out    Discussed health benefits of physical activity, and encouraged her to engage in regular exercise appropriate for her age and condition.  There are no diagnoses linked to this encounter.  No follow-ups on file.     Gilmore Laroche, FNP

## 2023-02-17 ENCOUNTER — Other Ambulatory Visit (HOSPITAL_COMMUNITY): Payer: Self-pay

## 2023-02-17 ENCOUNTER — Encounter: Payer: Self-pay | Admitting: Psychiatry

## 2023-02-17 ENCOUNTER — Ambulatory Visit (INDEPENDENT_AMBULATORY_CARE_PROVIDER_SITE_OTHER): Payer: HMO | Admitting: Psychiatry

## 2023-02-17 VITALS — BP 129/78 | HR 62 | Temp 97.8°F | Ht 64.0 in | Wt 173.6 lb

## 2023-02-17 DIAGNOSIS — F5105 Insomnia due to other mental disorder: Secondary | ICD-10-CM | POA: Diagnosis not present

## 2023-02-17 DIAGNOSIS — F411 Generalized anxiety disorder: Secondary | ICD-10-CM | POA: Diagnosis not present

## 2023-02-17 DIAGNOSIS — G3184 Mild cognitive impairment, so stated: Secondary | ICD-10-CM | POA: Diagnosis not present

## 2023-02-17 DIAGNOSIS — F3176 Bipolar disorder, in full remission, most recent episode depressed: Secondary | ICD-10-CM

## 2023-02-17 DIAGNOSIS — G2581 Restless legs syndrome: Secondary | ICD-10-CM

## 2023-02-17 MED ORDER — SERTRALINE HCL 25 MG PO TABS
25.0000 mg | ORAL_TABLET | Freq: Every day | ORAL | 1 refills | Status: DC
  Filled 2023-02-17: qty 30, 30d supply, fill #0

## 2023-02-17 NOTE — Patient Instructions (Signed)
Sertraline Tablets What is this medication? SERTRALINE (SER tra leen) treats depression, anxiety, obsessive-compulsive disorder (OCD), post-traumatic stress disorder (PTSD), and premenstrual dysphoric disorder (PMDD). It increases the amount of serotonin in the brain, a hormone that helps regulate mood. It belongs to a group of medications called SSRIs. This medicine may be used for other purposes; ask your health care provider or pharmacist if you have questions. COMMON BRAND NAME(S): Zoloft What should I tell my care team before I take this medication? They need to know if you have any of these conditions: Bleeding disorders Bipolar disorder or a family history of bipolar disorder Frequently drink alcohol Glaucoma Heart disease High blood pressure History of irregular heartbeat History of low levels of calcium, magnesium, or potassium in the blood Liver disease Receiving electroconvulsive therapy Seizures Suicidal thoughts, plans, or attempt by you or a family member Take medications that prevent or treat blood clots Thyroid disease An unusual or allergic reaction to sertraline, other medications, foods, dyes, or preservatives Pregnant or trying to get pregnant Breastfeeding How should I use this medication? Take this medication by mouth with a glass of water. Take it as directed on the prescription label at the same time every day. You can take it with or without food. If it upsets your stomach, take it with food. Do not take your medication more often than directed. Keep taking this medication unless your care team tells you to stop. Stopping it too quickly can cause serious side effects. It can also make your condition worse. A special MedGuide will be given to you by the pharmacist with each prescription and refill. Be sure to read this information carefully each time. Talk to your care team about the use of this medication in children. While it may be prescribed for children as  young as 7 years for selected conditions, precautions do apply. Overdosage: If you think you have taken too much of this medicine contact a poison control center or emergency room at once. NOTE: This medicine is only for you. Do not share this medicine with others. What if I miss a dose? If you miss a dose, take it as soon as you can. If it is almost time for your next dose, take only that dose. Do not take double or extra doses. What may interact with this medication? Do not take this medication with any of the following: Cisapride Dronedarone Linezolid MAOIs, such as Carbex, Eldepryl, Marplan, Nardil, and Parnate Methylene blue (injected into a vein) Pimozide Thioridazine This medication may also interact with the following: Alcohol Amphetamines Aspirin and aspirin-like medications Certain medications for fungal infections, such as ketoconazole, fluconazole, posaconazole, itraconazole Certain medications for irregular heart beat, such as flecainide, quinidine, propafenone Certain medications for mental health conditions Certain medications for migraine headaches, such as almotriptan, eletriptan, frovatriptan, naratriptan, rizatriptan, sumatriptan, zolmitriptan Certain medications for seizures, such as carbamazepine, valproic acid, phenytoin Certain medications for sleep Certain medications that prevent or treat blood clots, such as warfarin, enoxaparin, dalteparin Cimetidine Digoxin Diuretics Fentanyl Isoniazid Lithium NSAIDs, medications for pain and inflammation, such as ibuprofen or naproxen Other medications that cause heart rhythm changes, such as dofetilide Rasagiline Safinamide Supplements, such as St. John's wort, kava kava, valerian Tolbutamide Tramadol Tryptophan This list may not describe all possible interactions. Give your health care provider a list of all the medicines, herbs, non-prescription drugs, or dietary supplements you use. Also tell them if you smoke,  drink alcohol, or use illegal drugs. Some items may interact with your medicine.   What should I watch for while using this medication? Tell your care team if your symptoms do not get better or if they get worse. Visit your care team for regular checks on your progress. Because it may take several weeks to see the full effects of this medication, it is important to continue your treatment as prescribed by your care team. Patients and their families should watch out for new or worsening thoughts of suicide or depression. Also watch out for sudden changes in feelings such as feeling anxious, agitated, panicky, irritable, hostile, aggressive, impulsive, severely restless, overly excited and hyperactive, or not being able to sleep. If this happens, especially at the beginning of treatment or after a change in dose, call your care team. This medication may affect your coordination, reaction time, or judgment. Do not drive or operate machinery until you know how this medication affects you. Sit or stand up slowly to reduce the risk of dizzy or fainting spells. Drinking alcohol with this medication can increase the risk of these side effects. Your mouth may get dry. Chewing sugarless gum or sucking hard candy, and drinking plenty of water may help. Contact your care team if the problem does not go away or is severe. What side effects may I notice from receiving this medication? Side effects that you should report to your care team as soon as possible: Allergic reactions--skin rash, itching, hives, swelling of the face, lips, tongue, or throat Bleeding--bloody or black, tar-like stools, red or dark brown urine, vomiting blood or brown material that looks like coffee grounds, small red or purple spots on skin, unusual bleeding or bruising Heart rhythm changes--fast or irregular heartbeat, dizziness, feeling faint or lightheaded, chest pain, trouble breathing Low sodium level--muscle weakness, fatigue, dizziness,  headache, confusion Serotonin syndrome--irritability, confusion, fast or irregular heartbeat, muscle stiffness, twitching muscles, sweating, high fever, seizure, chills, vomiting, diarrhea Sudden eye pain or change in vision such as blurred vision, seeing halos around lights, vision loss Thoughts of suicide or self-harm, worsening mood Side effects that usually do not require medical attention (report these to your care team if they continue or are bothersome): Change in sex drive or performance Diarrhea Excessive sweating Nausea Tremors or shaking Upset stomach This list may not describe all possible side effects. Call your doctor for medical advice about side effects. You may report side effects to FDA at 1-800-FDA-1088. Where should I keep my medication? Keep out of the reach of children and pets. Store at room temperature between 20 and 25 degrees C (68 and 77 degrees F). Get rid of any unused medication after the expiration date. To get rid of medications that are no longer needed or expired: Take the medication to a medication take-back program. Check with your pharmacy or law enforcement to find a location. If you cannot return the medication, check the label or package insert to see if the medication should be thrown out in the garbage or flushed down the toilet. If you are not sure, ask your care team. If it is safe to put in the trash, empty the medication out of the container. Mix the medication with cat litter, dirt, coffee grounds, or other unwanted substance. Seal the mixture in a bag or container. Put it in the trash. NOTE: This sheet is a summary. It may not cover all possible information. If you have questions about this medicine, talk to your doctor, pharmacist, or health care provider.  2023 Elsevier/Gold Standard (2020-11-08 00:00:00)  

## 2023-02-17 NOTE — Progress Notes (Unsigned)
BH MD OP Progress Note  02/17/2023 5:51 PM Stacy Chambers  MRN:  [id number]  Chief Complaint:  Chief Complaint  Patient presents with   Follow-up   Depression   Anxiety   Medication Refill   HPI: Stacy Chambers is a 57 year old Caucasian female, lives in [place], has a history of bipolar disorder, GAD, insomnia, bulimia, RLS, MCI, memory loss, vitamin B12 deficiency was evaluated in office today.  Patient today reports she is currently struggling with a lot of anxiety.  She reports she is able medical problems, continues to have problems with her hearing that also contributes to her worsening anxiety.  She is currently trying to work with her providers on this.  Patient is currently taking Lexapro 20 mg per report.  She reports she was able to find a bottle of Lexapro 20 mg at home and stayed on it.  She did not make any changes as discussed last visit.  She does use clonazepam as needed and it helps when she uses it.   Patient denies any sadness.  She reports appetite is fair.  Patient denies any suicidality, homicidality or perceptual disturbances.  Patient reports sleep is overall good when she takes her medications.  Patient does not have a good sleep hygiene.  Patient reports good support system from her boyfriend who currently lives with her.  She also reports she was able to spend time with her 39-year-old granddaughter that helped her to feel better.  Denies any side effects to medications.  Patient alert, oriented to person place time situation.  3 word memory immediate 3 out of 3, after 5 minutes 3 out of 3.  Patient was able to do serial threes without any problem.  Patient also was able to do digits forward and backward.  Denies any other concerns today.  Visit Diagnosis:    ICD-10-CM   1. Bipolar disorder, in full remission, most recent episode depressed  F31.76     2. GAD (generalized anxiety disorder)  F41.1 sertraline (ZOLOFT) 25 MG tablet    3. Insomnia due  to mental disorder  F51.05    Anxiety    4. RLS (restless legs syndrome)  G25.81     5. MCI (mild cognitive impairment)  G31.84       Past Psychiatric History: I have reviewed past psychiatric history from progress note on 10/12/2018.  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   Chronic obstructive pulmonary disease 10/03/2015   COPD (chronic obstructive pulmonary disease)    no inhalers   Elevated liver function tests 11/26/2004   H/O NEGATIVE LIVER BIOPSY, workup negative   Gallstones 02/21/2015   GERD (gastroesophageal reflux disease)    Hyperlipidemia    Insomnia    Lower back pain    Osteoarthritis    Ovarian cancer 1995   RLS (restless legs syndrome)    Sedative, hypnotic or anxiolytic dependence with withdrawal, unspecified    Wears dentures    full upper and lower    Past Surgical History:  Procedure  Laterality Date   ABDOMINAL HYSTERECTOMY     APPENDECTOMY     BREAST BIOPSY Left 03/12/2015   fat necrosis   CESAREAN SECTION     COLONOSCOPY WITH PROPOFOL N/A 05/30/2018   Procedure: COLONOSCOPY WITH PROPOFOL;  Surgeon: Scot Jun, MD;  Location: [facility] ENDOSCOPY;  Service: Endoscopy;  Laterality: N/A;   EXCISION MORTON'S NEUROMA Left 10/07/2016   Procedure: EXCISION MORTON'S NEUROMA;  Surgeon: Gwyneth Revels, DPM;  Location: [facility];  Service: Podiatry;  Laterality: Left;  IV WITH LOCAL   LIVER BIOPSY  2005   PerPt: Done to eval elevated LFTs and biopsy provided no definite dx/cause of elevated LFTs   MULTIPLE  TOOTH EXTRACTIONS     OOPHORECTOMY     OSTECTOMY Right 04/10/2020   Procedure: DOUBLE OSTEOTOMY GENERAL W/ LOCAL;  Surgeon: Gwyneth Revels, DPM;  Location: [facility];  Service: Podiatry;  Laterality: Right;    Family Psychiatric History: Reviewed family psychiatric history from progress note on 10/13/2018.  Family History:  Family History  Adopted: Yes  Problem Relation Age of Onset   COPD Mother    Depression Mother    Colon cancer Mother        unsure of age   Cancer Father        Lung CA, Skin Cancer--?type   COPD Father    Stroke Father    Cancer Sister 87       cervical cancer   COPD Brother    Alcohol abuse Brother    Stroke Brother    COPD Maternal Aunt    Depression Daughter    Schizophrenia Son    Breast cancer Neg Hx     Social History: Reviewed social history from progress note on 10/12/2018. Social History   Socioeconomic History   Marital status: Widowed    Spouse name: Not on file   Number of children: Not on file   Years of education: Not on file   Highest education level: 10th grade  Occupational History   Not on file  Tobacco Use   Smoking status: Former    Packs/day: 1.00    Years: 30.00    Additional pack years: 0.00    Total pack years: 30.00    Types: Cigarettes    Quit date: 04/08/2009    Years since quitting: 13.8   Smokeless tobacco: Never  Vaping Use   Vaping Use: Never used  Substance and Sexual Activity   Alcohol use: Not Currently    Alcohol/week: 3.0 - 7.0 standard drinks of alcohol    Types: 3 - 7 Shots of liquor per week    Comment: occasionally   Drug use: Not Currently    Types: Marijuana    Comment: told to refrain until after surgery   Sexual activity: Yes    Birth control/protection: Surgical  Other Topics Concern   Not on file  Social History Narrative   Not on file   Social Determinants of Health   Financial Resource Strain: Medium Risk (02/02/2023)   Overall Financial Resource Strain (CARDIA)     Difficulty of Paying Living Expenses: Somewhat hard  Food Insecurity: Food Insecurity Present (02/02/2023)   Hunger Vital Sign    Worried About Running Out of Food in the Last Year: Sometimes true    Ran Out of Food in the Last Year: Sometimes true  Transportation Needs: No Transportation Needs (02/02/2023)   PRAPARE - Transportation    Lack of Transportation (Medical): No    Lack  of Transportation (Non-Medical): No  Physical Activity: Sufficiently Active (02/02/2023)   Exercise Vital Sign    Days of Exercise per Week: 3 days    Minutes of Exercise per Session: 100 min  Stress: Stress Concern Present (02/02/2023)   [employer] of Occupational Health - Occupational Stress Questionnaire    Feeling of Stress : To some extent  Social Connections: Unknown (02/02/2023)   Social Connection and Isolation Panel [NHANES]    Frequency of Communication with Friends and Family: Patient declined    Frequency of Social Gatherings with Friends and Family: Patient declined    Attends Religious Services: Never    Database administrator or Organizations: No    Attends Engineer, structural: Not on file    Marital Status: Widowed    Allergies:  Allergies  Allergen Reactions   Benadryl [Diphenhydramine Hcl]     Hives/ rapid heartrate   Codeine     dizziness   Vicodin [Hydrocodone-Acetaminophen] Other (See Comments)    dizziness    Metabolic Disorder Labs: Lab Results  Component Value Date   HGBA1C 5.4 10/30/2022   MPG 114 09/14/2018   No results found for: "PROLACTIN" Lab Results  Component Value Date   CHOL 172 10/30/2022   TRIG 205 (H) 10/30/2022   HDL 55 10/30/2022   CHOLHDL 3.1 10/30/2022   VLDL 18 03/04/2015   LDLCALC 83 10/30/2022   LDLCALC 56 03/30/2022   Lab Results  Component Value Date   TSH 1.700 10/30/2022   TSH 2.600 03/30/2022    Therapeutic Level Labs: Lab Results  Component Value Date   LITHIUM 0.3 (L) 10/24/2018   LITHIUM 0.49 (L) 01/13/2009   Lab  Results  Component Value Date   VALPROATE 70 11/11/2022   VALPROATE 29 (L) 06/02/2022   No results found for: "CBMZ"  Current Medications: Current Outpatient Medications  Medication Sig Dispense Refill   clonazePAM (KLONOPIN) 0.5 MG tablet Take 1 tablet (0.5 mg total) by mouth 2 to 3  times a week as directed for severe anxiety attacks. 6 tablet 1   divalproex (DEPAKOTE ER) 250 MG 24 hr tablet Take 1 tablet (250 mg total) by mouth daily along with 1000 mg for 1250 mg daily. 90 tablet 0   divalproex (DEPAKOTE ER) 500 MG 24 hr tablet Take 2 tablets (1,000 mg total) by mouth daily. Take along with 250 mg daily - total of 1250 mg 180 tablet 0   esomeprazole (NEXIUM) 20 MG capsule Take 1 capsule (20 mg total) by mouth daily as needed (take on empty stomach). 90 capsule 1   fluticasone (FLONASE) 50 MCG/ACT nasal spray Place 2 sprays into both nostrils daily. 16 mL 2   hydrOXYzine (ATARAX) 50 MG tablet TAKE 1 TABLET TWICE DAILY FOR SEVERE ANXIETY ATTACKS AS NEEDED 180 tablet 1   meloxicam (MOBIC) 15 MG tablet Take 1 tablet (15 mg total) by mouth daily. 90 tablet 1   methocarbamol (ROBAXIN) 500 MG tablet TAKE 1 TABLET EVERY DAY 60 tablet 3   methylPREDNISolone (MEDROL DOSEPAK) 4 MG TBPK tablet Take as directed per package instructions. 21 each 0   pantoprazole (PROTONIX) 20 MG tablet Take 1 tablet (20 mg total) by mouth daily. 30 tablet 1   sertraline (ZOLOFT) 25 MG tablet Take 1 tablet (25 mg total) by mouth daily with breakfast. (stop escitalopram) 30 tablet 1   traZODone (DESYREL) 100 MG tablet Take 1-2 tablets (100-200 mg total) by mouth at bedtime as needed for sleep. 180 tablet  1   Vitamin D, Ergocalciferol, (DRISDOL) 1.25 MG (50000 UNIT) CAPS capsule Take 1 capsule (50,000 Units total) by mouth every 7 (seven) days. 20 capsule 3   rosuvastatin (CRESTOR) 10 MG tablet Take 1 tablet (10 mg total) by mouth daily. 90 tablet 1   No current facility-administered medications for this visit.      Musculoskeletal: Strength & Muscle Tone: within normal limits Gait & Station: normal Patient leans: N/A  Psychiatric Specialty Exam: Review of Systems  HENT:  Positive for hearing loss.   Psychiatric/Behavioral:  The patient is nervous/anxious.   All other systems reviewed and are negative.   Blood pressure 129/78, pulse 62, temperature 97.8 F (36.6 C), temperature source Skin, height 5\' 4"  (1.626 m), weight 173 lb 9.6 oz (78.7 kg).Body mass index is 29.8 kg/m.  General Appearance: Casual  Eye Contact:  Fair  Speech:  Clear and Coherent  Volume:  Normal  Mood:  Anxious  Affect:  Congruent  Thought Process:  Goal Directed and Descriptions of Associations: Intact  Orientation:  Full (Time, Place, and Person)  Thought Content: Logical   Suicidal Thoughts:  No  Homicidal Thoughts:  No  Memory:  Immediate;   Fair Recent;   Fair Remote;   Fair  Judgement:  Fair  Insight:  Fair  Psychomotor Activity:  Normal  Concentration:  Concentration: Fair and Attention Span: Fair  Recall:  [employer] of Knowledge: Fair  Language: Fair  Akathisia:  No  Handed:  Right  AIMS (if indicated): not done  Assets:  Communication Skills Desire for Improvement Housing Intimacy Social Support Talents/Skills Transportation  ADL's:  Intact  Cognition: WNL  Sleep:  Fair   Screenings: AIMS    Flowsheet Row Office Visit from 11/23/2022 in [facility] Video Visit from 06/01/2022 in [facility] Video Visit from 03/24/2022 in [facility] Video Visit from 06/04/2021 in [facility] Office Visit from 01/28/2021 in [facility]  AIMS Total Score 0 0 0 0 0      GAD-7    Flowsheet Row Video Visit from 02/15/2023 in [facility] Office Visit from 02/05/2023 in [facility] Office Visit from 11/23/2022 in [facility] Office Visit from 10/30/2022 in [facility] Office Visit from 07/29/2022 in [facility]  Total GAD-7 Score 0 15 2 0 8      PHQ2-9    Flowsheet Row Video Visit from 02/15/2023 in [facility] Office Visit from 02/05/2023 in [facility] Office Visit from 11/23/2022 in [facility] Office Visit from 10/30/2022 in [facility] Video Visit from 08/24/2022 in [facility]  PHQ-2 Total Score 0 0 0 0 0  PHQ-9 Total Score 0 6 -- 0 2      Flowsheet Row Office Visit from 11/23/2022 in [facility] Video Visit from 08/24/2022 in [facility] Video Visit from 06/01/2022 in [facility]  C-SSRS RISK CATEGORY No Risk Low Risk Low Risk        Assessment and Plan: Stacy Chambers is a 58 year old Caucasian female, widowed, lives in [place], has a history of bipolar disorder, anxiety disorder, COPD, RLS, vitamin B12 deficiency was  evaluated in office today.  Patient is currently struggling with anxiety as well as physical complaints of hearing loss, currently working with her providers on the same, will benefit from the following plan.  Plan Bipolar disorder intermission Depakote ER 1250 mg p.o. daily Trazodone 100-200 mg p.o. nightly as needed Hydroxyzine 50 mg at bedtime as needed for sleep   GAD-unstable Discontinue Lexapro for lack of benefit Start sertraline 25 mg p.o. daily with breakfast Provided medication education discussed side effects. Klonopin 0.5 mg up to 2-3 times a week only for severe anxiety attacks-provided limited supply only.  Not for long-term use. Reviewed [place]  AWARxE CBT with Mr. Suzan Garibaldi  [facility] Continue CBT  RLS-stable Will monitor closely  Follow-up in clinic in 6 weeks or sooner if needed.    Collaboration of Care: Collaboration of Care: Other patient to follow up with primary provider for hearing loss.  Patient/Guardian was advised Release of Information must be obtained prior to any record release in order to collaborate their care with an outside provider. Patient/Guardian was advised if they have not already done so to contact the registration department to sign all necessary forms in order for [name] to release information regarding their care.   Consent: Patient/Guardian gives verbal consent for treatment and assignment of benefits for services provided during this visit. Patient/Guardian expressed understanding and agreed to proceed.   This note was generated in part or whole with voice recognition software. Voice recognition is usually quite accurate but there are transcription errors that can and very often do occur. I apologize for any typographical errors that were not detected and corrected.    Jomarie Longs, MD 02/17/2023, 5:51 PM

## 2023-02-18 ENCOUNTER — Ambulatory Visit (INDEPENDENT_AMBULATORY_CARE_PROVIDER_SITE_OTHER): Payer: HMO | Admitting: Family Medicine

## 2023-02-18 ENCOUNTER — Encounter: Payer: Self-pay | Admitting: Family Medicine

## 2023-02-18 ENCOUNTER — Other Ambulatory Visit: Payer: Self-pay

## 2023-02-18 ENCOUNTER — Other Ambulatory Visit (HOSPITAL_COMMUNITY): Payer: Self-pay

## 2023-02-18 VITALS — BP 107/68 | HR 66 | Resp 16 | Ht 64.0 in | Wt 173.0 lb

## 2023-02-18 DIAGNOSIS — H6992 Unspecified Eustachian tube disorder, left ear: Secondary | ICD-10-CM | POA: Diagnosis not present

## 2023-02-18 MED ORDER — PREDNISONE 20 MG PO TABS
40.0000 mg | ORAL_TABLET | Freq: Every day | ORAL | 0 refills | Status: AC
  Filled 2023-02-18: qty 8, 4d supply, fill #0

## 2023-02-18 NOTE — Patient Instructions (Signed)
I appreciate the opportunity to provide care to you today!    Follow up:  2 weeks  Eustachian tube dysfunction (LEFT) Please start taking prednisone 40 mg daily for 4 days I recommend not taking meloxicam or NSAID products while taking prednisone Please take hydroxyzine 40 mg daily for 4 days    Please continue to a heart-healthy diet and increase your physical activities. Try to exercise for at least five days a week.      It was a pleasure to see you and I look forward to continuing to work together on your health and well-being. Please do not hesitate to call the office if you need care or have questions about your care.   Have a wonderful day and week. With Gratitude, Gilmore Laroche MSN, FNP-BC

## 2023-02-18 NOTE — Assessment & Plan Note (Signed)
Will treat today with prednisone 40 mg daily for 4 days Encouraged to take hydroxyzine 50 mg daily We will follow-up in 2 weeks

## 2023-02-18 NOTE — Progress Notes (Signed)
Acute Office Visit  Subjective:    Patient ID: Stacy Chambers, female    DOB: November 02, 1965, 57 y.o.   MRN: [id number]  Chief Complaint  Patient presents with   Anxiety    1 mo f/u;    Ear Pain    Still getting sharp pains in left ear, comes and goes     HPI Patient is in today for with complaints of left ear pain.  Denies fever and chills.  No drainage reported.  No hearing loss reported.  Complains of a sharp jabbing pain that comes and goes.  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   Chronic obstructive pulmonary disease 10/03/2015   COPD (chronic obstructive pulmonary disease)    no inhalers   Elevated liver function tests 11/26/2004   H/O NEGATIVE LIVER BIOPSY, workup negative   Gallstones 02/21/2015   GERD (gastroesophageal reflux disease)    Hyperlipidemia    Insomnia    Lower back pain    Osteoarthritis    Ovarian cancer 1995   RLS (restless legs syndrome)    Sedative, hypnotic or anxiolytic dependence with withdrawal, unspecified    Wears dentures    full upper and lower    Past Surgical History:  Procedure Laterality Date   ABDOMINAL HYSTERECTOMY     APPENDECTOMY     BREAST BIOPSY Left 03/12/2015   fat necrosis   CESAREAN SECTION     COLONOSCOPY WITH PROPOFOL N/A 05/30/2018   Procedure: COLONOSCOPY WITH PROPOFOL;  Surgeon: Scot Jun, MD;  Location: [facility] END[facility];  Service: Endoscopy;  Laterality: N/A;   EXCISION MORTON'S NEUROMA Left 10/07/2016   Procedure: EXCISION MORTON'S NEUROMA;  Surgeon: Gwyneth Revels, DPM;  Location: [facility];  Service: Podiatry;  Laterality: Left;  IV WITH LOCAL   LIVER BIOPSY  2005   PerPt: Done to eval elevated LFTs and biopsy provided  no definite dx/cause of elevated LFTs   MULTIPLE TOOTH EXTRACTIONS     OOPHORECTOMY     OSTECTOMY Right 04/10/2020   Procedure: DOUBLE OSTEOTOMY GENERAL W/ LOCAL;  Surgeon: Gwyneth Revels, DPM;  Location: [facility];  Service: Podiatry;  Laterality: Right;    Family History  Adopted: Yes  Problem Relation Age of Onset   COPD Mother    Depression Mother    Colon cancer Mother        unsure of age   Cancer Father        Lung CA, Skin Cancer--?type   COPD Father    Stroke Father    Cancer Sister 80       cervical cancer   COPD Brother    Alcohol abuse Brother    Stroke Brother    COPD Maternal Aunt    Depression Daughter    Schizophrenia Son    Breast cancer Neg Hx     Social History   Socioeconomic History   Marital status: Widowed    Spouse name: Not on file   Number of children: Not on file   Years of education: Not on file   Highest education level: 10th grade  Occupational History   Not on file  Tobacco Use   Smoking status: Former    Packs/day: 1.00    Years: 30.00    Additional pack years: 0.00    Total pack years: 30.00    Types: Cigarettes    Quit date: 04/08/2009    Years since quitting: 13.8   Smokeless tobacco: Never  Vaping Use   Vaping Use: Never used  Substance and Sexual Activity   Alcohol use: Not Currently    Alcohol/week: 3.0 - 7.0 standard drinks of alcohol    Types: 3 - 7 Shots of liquor per week    Comment: occasionally   Drug use: Not Currently    Types: Marijuana    Comment: told to refrain until after surgery   Sexual activity: Yes    Birth control/protection: Surgical  Other Topics Concern   Not on file  Social History Narrative   Not on file   Social Determinants of Health   Financial Resource Strain: Medium Risk (02/02/2023)   Overall Financial Resource Strain (CARDIA)    Difficulty of Paying Living Expenses: Somewhat hard  Food Insecurity: Food Insecurity Present (02/02/2023)   Hunger Vital Sign    Worried About  Running Out of Food in the Last Year: Sometimes true    Ran Out of Food in the Last Year: Sometimes true  Transportation Needs: No Transportation Needs (02/02/2023)   PRAPARE - Administrator, Civil Service (Medical): No    Lack of Transportation (Non-Medical): No  Physical Activity: Sufficiently Active (02/02/2023)   Exercise Vital Sign    Days of Exercise per Week: 3 days    Minutes of Exercise per Session: 100 min  Stress: Stress Concern Present (02/02/2023)   [employer] of Occupational Health - Occupational Stress Questionnaire    Feeling of Stress : To some extent  Social Connections: Unknown (02/02/2023)   Social Connection and Isolation Panel [NHANES]    Frequency of Communication with Friends and Family: Patient declined    Frequency of Social Gatherings with Friends and Family: Patient declined    Attends Religious Services: Never    Database administrator or Organizations: No    Attends Engineer, structural: Not on file    Marital Status: Widowed  Intimate Partner Violence: Not on file    Outpatient Medications Prior to Visit  Medication Sig Dispense Refill   clonazePAM (KLONOPIN) 0.5 MG tablet Take 1 tablet (0.5 mg total) by mouth 2 to 3  times a week as directed for severe anxiety attacks. 6 tablet 1   divalproex (DEPAKOTE ER) 250 MG 24 hr tablet Take 1 tablet (250 mg total) by mouth daily along with 1000 mg for 1250 mg daily. 90 tablet 0   divalproex (DEPAKOTE ER) 500 MG 24 hr tablet Take 2 tablets (1,000 mg total) by mouth daily. Take along with 250 mg daily - total of 1250 mg 180 tablet 0   esomeprazole (NEXIUM) 20 MG capsule Take 1 capsule (20 mg total) by mouth daily as needed (take on empty stomach). 90 capsule 1   fluticasone (FLONASE) 50 MCG/ACT nasal spray Place 2 sprays into both nostrils daily. 16 mL 2   hydrOXYzine (ATARAX) 50 MG tablet TAKE 1 TABLET TWICE DAILY FOR SEVERE ANXIETY ATTACKS AS NEEDED 180 tablet 1   meloxicam (MOBIC) 15 MG  tablet Take 1 tablet (15 mg total) by mouth daily. 90 tablet 1   methocarbamol (ROBAXIN) 500 MG tablet TAKE 1 TABLET EVERY DAY 60 tablet 3   pantoprazole (PROTONIX) 20  MG tablet Take 1 tablet (20 mg total) by mouth daily. 30 tablet 1   sertraline (ZOLOFT) 25 MG tablet Take 1 tablet (25 mg total) by mouth daily with breakfast. (stop escitalopram) 30 tablet 1   traZODone (DESYREL) 100 MG tablet Take 1-2 tablets (100-200 mg total) by mouth at bedtime as needed for sleep. 180 tablet 1   Vitamin D, Ergocalciferol, (DRISDOL) 1.25 MG (50000 UNIT) CAPS capsule Take 1 capsule (50,000 Units total) by mouth every 7 (seven) days. 20 capsule 3   methylPREDNISolone (MEDROL DOSEPAK) 4 MG TBPK tablet Take as directed per package instructions. 21 each 0   rosuvastatin (CRESTOR) 10 MG tablet Take 1 tablet (10 mg total) by mouth daily. 90 tablet 1   No facility-administered medications prior to visit.    Allergies  Allergen Reactions   Benadryl [Diphenhydramine Hcl]     Hives/ rapid heartrate   Codeine     dizziness   Vicodin [Hydrocodone-Acetaminophen] Other (See Comments)    dizziness    Review of Systems  Constitutional:  Negative for chills and fever.  HENT:  Positive for ear pain. Negative for ear discharge.   Eyes:  Negative for visual disturbance.  Respiratory:  Negative for chest tightness and shortness of breath.   Neurological:  Negative for dizziness and headaches.       Objective:    Physical Exam HENT:     Head: Normocephalic.     Right Ear: No drainage or tenderness. No middle ear effusion.     Left Ear: No drainage or tenderness. A middle ear effusion is present.     Mouth/Throat:     Mouth: Mucous membranes are moist.  Cardiovascular:     Rate and Rhythm: Normal rate.     Heart sounds: Normal heart sounds.  Pulmonary:     Effort: Pulmonary effort is normal.     Breath sounds: Normal breath sounds.  Neurological:     Mental Status: She is alert.     BP 107/68   Pulse  66   Resp 16   Ht  (1.626 m)   Wt 173 lb (78.5 kg)   SpO2 97%   BMI 29.70 kg/m  Wt Readings from Last 3 Encounters:  02/18/23 173 lb (78.5 kg)  02/15/23 171 lb (77.6 kg)  02/05/23 179 lb (81.2 kg)       Assessment & Plan:  Eustachian tube dysfunction, left Assessment & Plan: Will treat today with prednisone 40 mg daily for 4 days Encouraged to take hydroxyzine 50 mg daily We will follow-up in 2 weeks  Orders: -     predniSONE; Take 2 tablets (40 mg total) by mouth daily with breakfast for 4 days.  Dispense: 8 tablet; Refill: 0    Gilmore Laroche, FNP

## 2023-02-25 ENCOUNTER — Ambulatory Visit (HOSPITAL_COMMUNITY)
Admission: RE | Admit: 2023-02-25 | Discharge: 2023-02-25 | Disposition: A | Discharge status: Home / Self Care | Payer: HMO | Source: Ambulatory Visit | Attending: Family Medicine | Admitting: Family Medicine

## 2023-02-25 DIAGNOSIS — Z87891 Personal history of nicotine dependence: Secondary | ICD-10-CM | POA: Diagnosis not present

## 2023-03-01 NOTE — Progress Notes (Signed)
Please inform the patient that her lung cancer screening showed a benign nodule with a very low likelihood of becoming a clinically active cancer. Recommend yearly screening

## 2023-03-04 ENCOUNTER — Ambulatory Visit (INDEPENDENT_AMBULATORY_CARE_PROVIDER_SITE_OTHER): Payer: HMO | Admitting: Clinical

## 2023-03-04 DIAGNOSIS — F411 Generalized anxiety disorder: Secondary | ICD-10-CM

## 2023-03-04 DIAGNOSIS — F3131 Bipolar disorder, current episode depressed, mild: Secondary | ICD-10-CM | POA: Diagnosis not present

## 2023-03-04 DIAGNOSIS — F5105 Insomnia due to other mental disorder: Secondary | ICD-10-CM | POA: Diagnosis not present

## 2023-03-04 NOTE — Progress Notes (Signed)
Virtual Visit via Video Note   I connected with Stacy Chambers on 03/04/23 at  8:00 AM EST by a video enabled telemedicine application and verified that I am speaking with the correct person using two identifiers.   Location: Patient: Home Provider: Office   I discussed the limitations of evaluation and management by telemedicine and the availability of in person appointments. The patient expressed understanding and agreed to proceed.     THERAPIST PROGRESS NOTE   Session Time: 8:00 AM-8:30 AM   Participation Level: Active   Behavioral Response: CasualAlertDepressed   Type of Therapy: Individual Therapy   Treatment Goals addressed: Coping   Interventions: CBT   Summary: Stacy Chambers is a 57 y.o. female who presents with Bipolar Disorder./ GAD/Insomnia. The OPT therapist worked with the patient for her ongoing OPT treatment session. The OPT therapist utilized Motivational Interviewing to assist in creating therapeutic repore. The patient in the session was engaged and work in collaboration giving feedback about her triggers and symptoms over the past few weeks.The patient spoke about concern about her recent X-Ray and CT Scan which she will be following up with her PCP tomorrow to review the results. The patient spoke about discontinuing her medication recently prescribed due to having negative side effects.The OPT therapist utilized Cognitive Behavioral Therapy through cognitive restructuring as well as worked with the patient on coping strategies to assist in management of mood. The OPT therapist worked with the patient on continuing to maintain her basic needs of self care with eating, hygiene, and sleep. The patient notes she is doing well with her interactions with others in the home.The patient spoke about her current relationship and noted things are going well still. 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    Suicidal/Homicidal:  Nowithout intent/plan   Therapist Response: The OPT therapist worked with the patient for the patients scheduled session. The patient was engaged in her session and gave feedback in relation to triggers, symptoms, and behavior responses over the past few weeks. The patient spoke about her concern around recent X-ray / CT Scan which she will be following up on with her PCP tomorrow. The patient spoke about relationship continuing to go well and she is currently living with her new boyfriend. The OPT therapist worked with the patient utilizing an in session Cognitive Behavioral Therapy exercise. The patient was responsive in the session and verbalized," I had to stop taking one of my medicines I was having to many negative side effects". The OPT therapist worked with the patient on staying active , managing her basic health needs, and being consistent with her medication therapy plan. The OPT therapist worked with the patient gauging activity level, coping skill implementation, and mood. The patient spoke about making plans for vacationing with the boyfriend at the end of May to [place]. The patient spoke about looking forward to hanging out with her granddaughter next weekend. The patient spoke about looking forward to get her dentures in this month.The OPT therapist will continue treatment work with the patient in her next scheduled session    Plan: Return again in 3 weeks.   Diagnosis:      Axis I: Bipolar 1 disorder,depressed mild/GAD/Insomnia/ Attention deficit.                             Axis II: No diagnosis    Collaboration of Care: Collaboration of care  reviewed with medication therapy involvement with psychiatrist Dr. Elna Breslow.   Patient/Guardian was advised Release of Information must be obtained prior to any record release in order to collaborate their care with an outside provider. Patient/Guardian was advised if they have not already done so to contact the registration department to  sign all necessary forms in order for [name] to release information regarding their care.    Consent: Patient/Guardian gives verbal consent for treatment and assignment of benefits for services provided during this visit. Patient/Guardian expressed understanding and agreed to proceed.        I discussed the assessment and treatment plan with the patient. The patient was provided an opportunity to ask questions and all were answered. The patient agreed with the plan and demonstrated an understanding of the instructions.   The patient was advised to call back or seek an in-person evaluation if the symptoms worsen or if the condition fails to improve as anticipated.   I provided 30 minutes of non-face-to-face time during this encounter.   [name], LCSW   03/04/2023

## 2023-03-05 ENCOUNTER — Ambulatory Visit (INDEPENDENT_AMBULATORY_CARE_PROVIDER_SITE_OTHER): Payer: HMO | Admitting: Family Medicine

## 2023-03-05 ENCOUNTER — Encounter: Payer: Self-pay | Admitting: Family Medicine

## 2023-03-05 VITALS — BP 126/86 | HR 86 | Ht 64.0 in | Wt 176.1 lb

## 2023-03-05 DIAGNOSIS — H6992 Unspecified Eustachian tube disorder, left ear: Secondary | ICD-10-CM | POA: Diagnosis not present

## 2023-03-05 DIAGNOSIS — R635 Abnormal weight gain: Secondary | ICD-10-CM | POA: Diagnosis not present

## 2023-03-05 NOTE — Patient Instructions (Signed)
I appreciate the opportunity to provide care to you today!    Follow up:  1 month obesity   Healthy tips for weight loss Eat three meals per day at times discussed. Cut out all diet bevergages and drink only water Eat whole food plant based meals Cut out junk food, fast food and processed foods Exercise 150 minutes a week Lose 1-2 lbs per week. Keep a food journal Choose foods that grow in a garden or in a fruit orchard and protein of animals with fins or feathers.  Lifestyle Medicine - Whole Food, Plant Predominant Nutrition is highly recommended: Eat Plenty of vegetables, Mushrooms, fruits, Legumes, Whole Grains, Nuts, seeds in lieu of processed meats, processed snacks/pastries red meat, poultry, eggs.  -It is better to avoid simple carbohydrates including: Cakes, Sweet Desserts, Ice Cream, Soda (diet and regular), Sweet Tea, Candies, Chips, Cookies, Store Bought Juices, Alcohol in Excess of  1-2 drinks a day, Lemonade,  Artificial Sweeteners, Doughnuts, Coffee Creamers, "Sugar-free" Products, etc, etc.  This is not a complete list.....  Exercise: If you are able: 30 -60 minutes a day ,4 days a week, or 150 minutes a week.  The longer the better.  Combine stretch, strength, and aerobic activities.  If you were told in the past that you have high risk for cardiovascular diseases, you may seek evaluation by your heart doctor prior to initiating moderate to intense exercise programs.    Please continue to a heart-healthy diet and increase your physical activities. Try to exercise for at least five days a week.      It was a pleasure to see you and I look forward to continuing to work together on your health and well-being. Please do not hesitate to call the office if you need care or have questions about your care.   Have a wonderful day and week. With Gratitude, Gilmore Laroche MSN, FNP-BC

## 2023-03-05 NOTE — Progress Notes (Signed)
Established Patient Office Visit  Subjective:  Patient ID: Stacy Chambers, female    DOB: 08-Dec-1965  Age: 57 y.o. MRN: [id number]  CC:  Chief Complaint  Patient presents with   Chronic Care Management    2 week f/u, reports still some crackling in left ear.    Obesity    Pt reports weight concerns.     HPI Stacy Chambers is a 57 y.o. female with past medical history of obesity, presents for f/u. For the details of today's visit, please refer to the assessment and plan.    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   Chronic obstructive pulmonary disease (HCC) 10/03/2015   COPD (chronic obstructive pulmonary disease) (HCC)    no inhalers   Elevated liver function tests 11/26/2004   H/O NEGATIVE LIVER BIOPSY, workup negative   Gallstones 02/21/2015   GERD (gastroesophageal reflux disease)    Hyperlipidemia    Insomnia    Lower back pain    Osteoarthritis    Ovarian cancer (HCC) 1995   RLS (restless legs syndrome)    Sedative, hypnotic or anxiolytic dependence with withdrawal, 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HCC)    Wears dentures    full upper and lower    Past Surgical History:  Procedure Laterality Date   ABDOMINAL HYSTERECTOMY     APPENDECTOMY     BREAST BIOPSY Left 03/12/2015   fat necrosis   CESAREAN SECTION     COLONOSCOPY WITH PROPOFOL N/A 05/30/2018   Procedure: COLONOSCOPY WITH PROPOFOL;  Surgeon: Scot Jun, MD;  Location: [facility] ENDOSCOPY;  Service: Endoscopy;  Laterality: N/A;   EXCISION MORTON'S NEUROMA Left 10/07/2016   Procedure: EXCISION MORTON'S NEUROMA;  Surgeon: Gwyneth Revels, DPM;  Location: [facility];  Service: Podiatry;  Laterality: Left;  IV WITH  LOCAL   LIVER BIOPSY  2005   PerPt: Done to eval elevated LFTs and biopsy provided no definite dx/cause of elevated LFTs   MULTIPLE TOOTH EXTRACTIONS     OOPHORECTOMY     OSTECTOMY Right 04/10/2020   Procedure: DOUBLE OSTEOTOMY GENERAL W/ LOCAL;  Surgeon: Gwyneth Revels, DPM;  Location: [facility];  Service: Podiatry;  Laterality: Right;    Family History  Adopted: Yes  Problem Relation Age of Onset   COPD Mother    Depression Mother    Colon cancer Mother        unsure of age   Cancer Father        Lung CA, Skin Cancer--?type   COPD Father    Stroke Father    Cancer Sister 63       cervical cancer   COPD Brother    Alcohol abuse Brother    Stroke Brother    COPD Maternal Aunt    Depression Daughter    Schizophrenia Son    Breast cancer Neg Hx     Social History   Socioeconomic History   Marital status: Widowed    Spouse name: Not on file   Number of children: Not on file   Years of education: Not on file   Highest education level: 10th grade  Occupational  History   Not on file  Tobacco Use   Smoking status: Former    Packs/day: 1.00    Years: 30.00    Additional pack years: 0.00    Total pack years: 30.00    Types: Cigarettes    Quit date: 04/08/2009    Years since quitting: 13.9   Smokeless tobacco: Never  Vaping Use   Vaping Use: Never used  Substance and Sexual Activity   Alcohol use: Not Currently    Alcohol/week: 3.0 - 7.0 standard drinks of alcohol    Types: 3 - 7 Shots of liquor per week    Comment: occasionally   Drug use: Not Currently    Types: Marijuana    Comment: told to refrain until after surgery   Sexual activity: Yes    Birth control/protection: Surgical  Other Topics Concern   Not on file  Social History Narrative   Not on file   Social Determinants of Health   Financial Resource Strain: Medium Risk (02/02/2023)   Overall Financial Resource Strain (CARDIA)    Difficulty of Paying Living Expenses: Somewhat hard  Food  Insecurity: Food Insecurity Present (02/02/2023)   Hunger Vital Sign    Worried About Running Out of Food in the Last Year: Sometimes true    Ran Out of Food in the Last Year: Sometimes true  Transportation Needs: No Transportation Needs (02/02/2023)   PRAPARE - Administrator, Civil Service (Medical): No    Lack of Transportation (Non-Medical): No  Physical Activity: Sufficiently Active (02/02/2023)   Exercise Vital Sign    Days of Exercise per Week: 3 days    Minutes of Exercise per Session: 100 min  Stress: Stress Concern Present (02/02/2023)   [employer] of Occupational Health - Occupational Stress Questionnaire    Feeling of Stress : To some extent  Social Connections: Unknown (02/02/2023)   Social Connection and Isolation Panel [NHANES]    Frequency of Communication with Friends and Family: Patient declined    Frequency of Social Gatherings with Friends and Family: Patient declined    Attends Religious Services: Never    Database administrator or Organizations: No    Attends Engineer, structural: Not on file    Marital Status: Widowed  Intimate Partner Violence: Not on file    Outpatient Medications Prior to Visit  Medication Sig Dispense Refill   clonazePAM (KLONOPIN) 0.5 MG tablet Take 1 tablet (0.5 mg total) by mouth 2 to 3  times a week as directed for severe anxiety attacks. 6 tablet 1   divalproex (DEPAKOTE ER) 250 MG 24 hr tablet Take 1 tablet (250 mg total) by mouth daily along with 1000 mg for 1250 mg daily. 90 tablet 0   divalproex (DEPAKOTE ER) 500 MG 24 hr tablet Take 2 tablets (1,000 mg total) by mouth daily. Take along with 250 mg daily - total of 1250 mg 180 tablet 0   esomeprazole (NEXIUM) 20 MG capsule Take 1 capsule (20 mg total) by mouth daily as needed (take on empty stomach). 90 capsule 1   fluticasone (FLONASE) 50 MCG/ACT nasal spray Place 2 sprays into both nostrils daily. 16 mL 2   hydrOXYzine (ATARAX) 50 MG tablet TAKE 1 TABLET TWICE  DAILY FOR SEVERE ANXIETY ATTACKS AS NEEDED 180 tablet 1   meloxicam (MOBIC) 15 MG tablet Take 1 tablet (15 mg total) by mouth daily. 90 tablet 1   methocarbamol (ROBAXIN) 500 MG tablet TAKE 1 TABLET EVERY DAY 60  tablet 3   pantoprazole (PROTONIX) 20 MG tablet Take 1 tablet (20 mg total) by mouth daily. 30 tablet 1   traZODone (DESYREL) 100 MG tablet Take 1-2 tablets (100-200 mg total) by mouth at bedtime as needed for sleep. 180 tablet 1   Vitamin D, Ergocalciferol, (DRISDOL) 1.25 MG (50000 UNIT) CAPS capsule Take 1 capsule (50,000 Units total) by mouth every 7 (seven) days. 20 capsule 3   rosuvastatin (CRESTOR) 10 MG tablet Take 1 tablet (10 mg total) by mouth daily. 90 tablet 1   sertraline (ZOLOFT) 25 MG tablet Take 1 tablet (25 mg total) by mouth daily with breakfast. (stop escitalopram) 30 tablet 1   No facility-administered medications prior to visit.    Allergies  Allergen Reactions   Benadryl [Diphenhydramine Hcl]     Hives/ rapid heartrate   Codeine     dizziness   Vicodin [Hydrocodone-Acetaminophen] Other (See Comments)    dizziness    ROS Review of Systems  Constitutional:  Negative for chills and fever.  Eyes:  Negative for visual disturbance.  Respiratory:  Negative for chest tightness and shortness of breath.   Neurological:  Negative for dizziness and headaches.      Objective:    Physical Exam HENT:     Head: Normocephalic.     Right Ear: Tympanic membrane and ear canal normal. No decreased hearing noted. No drainage, swelling or tenderness.     Left Ear: Tympanic membrane and ear canal normal. No decreased hearing noted. No drainage, swelling or tenderness.     Mouth/Throat:     Mouth: Mucous membranes are moist.  Cardiovascular:     Rate and Rhythm: Normal rate.     Heart sounds: Normal heart sounds.  Pulmonary:     Effort: Pulmonary effort is normal.     Breath sounds: Normal breath sounds.  Neurological:     Mental Status: She is alert.     BP  126/86   Pulse 86   Ht 5\' 4"  (1.626 m)   Wt 176 lb 1.3 oz (79.9 kg)   SpO2 96%   BMI 30.22 kg/m  Wt Readings from Last 3 Encounters:  03/05/23 176 lb 1.3 oz (79.9 kg)  02/18/23 173 lb (78.5 kg)  02/15/23 171 lb (77.6 kg)    Lab Results  Component Value Date   TSH 1.700 10/30/2022   Lab Results  Component Value Date   WBC 7.0 10/30/2022   HGB 12.2 10/30/2022   HCT 35.5 10/30/2022   MCV 90 10/30/2022   PLT 181 10/30/2022   Lab Results  Component Value Date   NA 138 10/30/2022   K 4.0 10/30/2022   CO2 25 10/30/2022   GLUCOSE 93 10/30/2022   BUN 16 10/30/2022   CREATININE 0.67 10/30/2022   BILITOT 0.3 10/30/2022   ALKPHOS 77 10/30/2022   AST 18 10/30/2022   ALT 16 10/30/2022   PROT 7.4 10/30/2022   ALBUMIN 4.9 10/30/2022   CALCIUM 10.2 10/30/2022   EGFR 102 10/30/2022   Lab Results  Component Value Date   CHOL 172 10/30/2022   Lab Results  Component Value Date   HDL 55 10/30/2022   Lab Results  Component Value Date   LDLCALC 83 10/30/2022   Lab Results  Component Value Date   TRIG 205 (H) 10/30/2022   Lab Results  Component Value Date   CHOLHDL 3.1 10/30/2022   Lab Results  Component Value Date   HGBA1C 5.4 10/30/2022      Assessment &  Plan:  Weight gain Assessment & Plan: Reports increased weight gain Will like to discuss weight loss options My weight management plan reviewed with the patient Encourage increased physical activity and a heart healthy diet Declines a referral to a nutritionist Will follow up in 1 month Wt Readings from Last 3 Encounters:  03/05/23 176 lb 1.3 oz (79.9 kg)  02/18/23 173 lb (78.5 kg)  02/15/23 171 lb (77.6 kg)      Eustachian tube dysfunction, left Assessment & Plan: Tympanic membrane and ear canal normal No drainage noted Reports mild crackling but declines referral to ENT at this time We will continue to monitor     Follow-up: Return in about 1 month (around 04/05/2023).   Gilmore Laroche, FNP

## 2023-03-05 NOTE — Assessment & Plan Note (Signed)
Tympanic membrane and ear canal normal No drainage noted Reports mild crackling but declines referral to ENT at this time We will continue to monitor

## 2023-03-05 NOTE — Assessment & Plan Note (Signed)
Reports increased weight gain Will like to discuss weight loss options My weight management plan reviewed with the patient Encourage increased physical activity and a heart healthy diet Declines a referral to a nutritionist Will follow up in 1 month Wt Readings from Last 3 Encounters:  03/05/23 176 lb 1.3 oz (79.9 kg)  02/18/23 173 lb (78.5 kg)  02/15/23 171 lb (77.6 kg)

## 2023-03-08 ENCOUNTER — Ambulatory Visit: Payer: HMO | Admitting: Family Medicine

## 2023-04-05 ENCOUNTER — Ambulatory Visit (INDEPENDENT_AMBULATORY_CARE_PROVIDER_SITE_OTHER): Payer: HMO | Admitting: Family Medicine

## 2023-04-05 ENCOUNTER — Encounter: Payer: Self-pay | Admitting: Family Medicine

## 2023-04-05 VITALS — BP 110/67 | HR 82 | Ht 64.0 in | Wt 176.1 lb

## 2023-04-05 DIAGNOSIS — Z6830 Body mass index (BMI) 30.0-30.9, adult: Secondary | ICD-10-CM

## 2023-04-05 DIAGNOSIS — E669 Obesity, unspecified: Secondary | ICD-10-CM | POA: Diagnosis not present

## 2023-04-05 DIAGNOSIS — Z87891 Personal history of nicotine dependence: Secondary | ICD-10-CM

## 2023-04-05 NOTE — Assessment & Plan Note (Signed)
She reports that her exercise regimen include working the yard Daily dietary team intake includes sausages ham and cheese muffin for breakfast, steak a [place] leg for dinner. She reports drinking water and Gatorade minimally Declines referral to a nutritionist The patient has maintained her weight at 176 pounds Encouraged heart healthy diet with increased physical activity, moderate intensity We will follow-up in 4 weeks

## 2023-04-05 NOTE — Assessment & Plan Note (Signed)
She reports started smoking at age of 57 1 pack a day She quit smoking in 2011 She notes family history of cancer Recent CT scan of the chest showed  posterior right lower lobe has a mean derived diameter of 4.1 mm, with recommendation to follow-up in a year Referral placed to pulmonary for lung cancer screening

## 2023-04-05 NOTE — Progress Notes (Signed)
Established Patient Office Visit  Subjective:  Patient ID: Stacy Chambers, female    DOB: [birth year]  Age: 57 y.o. MRN: [id number]  CC:  Chief Complaint  Patient presents with   Obesity    Pt reports making healthy choices doing well.     HPI Stacy Chambers is a 57 y.o. female with past medical history of bipolar disorder, attention and concentration deficit, hyperlipidemia, and obesity presents for f/u of  chronic medical conditions. For the details of today's visit, please refer to the assessment and plan.     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   Chronic obstructive pulmonary disease (HCC) 10/03/2015   COPD (chronic obstructive pulmonary disease) (HCC)    no inhalers   Elevated liver function tests 11/26/2004   H/O NEGATIVE LIVER BIOPSY, workup negative   Gallstones 02/21/2015   GERD (gastroesophageal reflux disease)    Hyperlipidemia    Insomnia    Lower back pain    Osteoarthritis    Ovarian cancer (HCC) 1995   RLS (restless legs syndrome)    Sedative, hypnotic or anxiolytic dependence with withdrawal, 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HCC)    Wears dentures    full upper and lower    Past Surgical History:  Procedure Laterality Date   ABDOMINAL HYSTERECTOMY     APPENDECTOMY     BREAST BIOPSY Left 03/12/2015   fat necrosis   CESAREAN SECTION     COLONOSCOPY WITH PROPOFOL N/A 05/30/2018   Procedure: COLONOSCOPY WITH PROPOFOL;  Surgeon: Scot Jun, MD;  Location: [facility] ENDOSCOPY;  Service: Endoscopy;  Laterality: N/A;   EXCISION MORTON'S NEUROMA Left 10/07/2016   Procedure: EXCISION MORTON'S NEUROMA;  Surgeon: Gwyneth Revels, DPM;  Location: [facility];  Service:  Podiatry;  Laterality: Left;  IV WITH LOCAL   LIVER BIOPSY  2005   PerPt: Done to eval elevated LFTs and biopsy provided no definite dx/cause of elevated LFTs   MULTIPLE TOOTH EXTRACTIONS     OOPHORECTOMY     OSTECTOMY Right 04/10/2020   Procedure: DOUBLE OSTEOTOMY GENERAL W/ LOCAL;  Surgeon: Gwyneth Revels, DPM;  Location: [facility];  Service: Podiatry;  Laterality: Right;    Family History  Adopted: Yes  Problem Relation Age of Onset   COPD Mother    Depression Mother    Colon cancer Mother        unsure of age   Cancer Father        Lung CA, Skin Cancer--?type   COPD Father    Stroke Father    Cancer Sister 5       cervical cancer   COPD Brother    Alcohol abuse Brother    Stroke Brother    COPD Maternal Aunt    Depression Daughter    Schizophrenia Son    Breast cancer Neg Hx     Social History   Socioeconomic History   Marital status: Widowed    Spouse name: Not on file   Number of children: Not on file   Years of education: Not on file   Highest education level: 10th grade  Occupational History  Not on file  Tobacco Use   Smoking status: Former    Packs/day: 1.00    Years: 30.00    Additional pack years: 0.00    Total pack years: 30.00    Types: Cigarettes    Quit date: 04/08/2009    Years since quitting: 14.0   Smokeless tobacco: Never  Vaping Use   Vaping Use: Never used  Substance and Sexual Activity   Alcohol use: Not Currently    Alcohol/week: 3.0 - 7.0 standard drinks of alcohol    Types: 3 - 7 Shots of liquor per week    Comment: occasionally   Drug use: Not Currently    Types: Marijuana    Comment: told to refrain until after surgery   Sexual activity: Yes    Birth control/protection: Surgical  Other Topics Concern   Not on file  Social History Narrative   Not on file   Social Determinants of Health   Financial Resource Strain: Medium Risk (02/02/2023)   Overall Financial Resource Strain (CARDIA)    Difficulty of Paying  Living Expenses: Somewhat hard  Food Insecurity: Food Insecurity Present (02/02/2023)   Hunger Vital Sign    Worried About Running Out of Food in the Last Year: Sometimes true    Ran Out of Food in the Last Year: Sometimes true  Transportation Needs: No Transportation Needs (02/02/2023)   PRAPARE - Administrator, Civil Service (Medical): No    Lack of Transportation (Non-Medical): No  Physical Activity: Sufficiently Active (02/02/2023)   Exercise Vital Sign    Days of Exercise per Week: 3 days    Minutes of Exercise per Session: 100 min  Stress: Stress Concern Present (02/02/2023)   [employer] of Occupational Health - Occupational Stress Questionnaire    Feeling of Stress : To some extent  Social Connections: Unknown (02/02/2023)   Social Connection and Isolation Panel [NHANES]    Frequency of Communication with Friends and Family: Patient declined    Frequency of Social Gatherings with Friends and Family: Patient declined    Attends Religious Services: Never    Database administrator or Organizations: No    Attends Engineer, structural: Not on file    Marital Status: Widowed  Intimate Partner Violence: Not on file    Outpatient Medications Prior to Visit  Medication Sig Dispense Refill   clonazePAM (KLONOPIN) 0.5 MG tablet Take 1 tablet (0.5 mg total) by mouth 2 to 3  times a week as directed for severe anxiety attacks. 6 tablet 1   divalproex (DEPAKOTE ER) 250 MG 24 hr tablet Take 1 tablet (250 mg total) by mouth daily along with 1000 mg for 1250 mg daily. 90 tablet 0   divalproex (DEPAKOTE ER) 500 MG 24 hr tablet Take 2 tablets (1,000 mg total) by mouth daily. Take along with 250 mg daily - total of 1250 mg 180 tablet 0   esomeprazole (NEXIUM) 20 MG capsule Take 1 capsule (20 mg total) by mouth daily as needed (take on empty stomach). 90 capsule 1   fluticasone (FLONASE) 50 MCG/ACT nasal spray Place 2 sprays into both nostrils daily. 16 mL 2   hydrOXYzine  (ATARAX) 50 MG tablet TAKE 1 TABLET TWICE DAILY FOR SEVERE ANXIETY ATTACKS AS NEEDED 180 tablet 1   meloxicam (MOBIC) 15 MG tablet Take 1 tablet (15 mg total) by mouth daily. 90 tablet 1   methocarbamol (ROBAXIN) 500 MG tablet TAKE 1 TABLET EVERY DAY 60 tablet 3  pantoprazole (PROTONIX) 20 MG tablet Take 1 tablet (20 mg total) by mouth daily. 30 tablet 1   traZODone (DESYREL) 100 MG tablet Take 1-2 tablets (100-200 mg total) by mouth at bedtime as needed for sleep. 180 tablet 1   Vitamin D, Ergocalciferol, (DRISDOL) 1.25 MG (50000 UNIT) CAPS capsule Take 1 capsule (50,000 Units total) by mouth every 7 (seven) days. 20 capsule 3   rosuvastatin (CRESTOR) 10 MG tablet Take 1 tablet (10 mg total) by mouth daily. 90 tablet 1   sertraline (ZOLOFT) 25 MG tablet Take 1 tablet (25 mg total) by mouth daily with breakfast. (stop escitalopram) 30 tablet 1   No facility-administered medications prior to visit.    Allergies  Allergen Reactions   Benadryl [Diphenhydramine Hcl]     Hives/ rapid heartrate   Codeine     dizziness   Vicodin [Hydrocodone-Acetaminophen] Other (See Comments)    dizziness    ROS Review of Systems  Constitutional:  Negative for chills and fever.  Eyes:  Negative for visual disturbance.  Respiratory:  Negative for chest tightness and shortness of breath.   Neurological:  Negative for dizziness and headaches.      Objective:    Physical Exam HENT:     Head: Normocephalic.     Mouth/Throat:     Mouth: Mucous membranes are moist.  Cardiovascular:     Rate and Rhythm: Normal rate.     Heart sounds: Normal heart sounds.  Pulmonary:     Effort: Pulmonary effort is normal.     Breath sounds: Normal breath sounds.  Neurological:     Mental Status: She is alert.     BP 110/67   Pulse 82   Ht 5\' 4"  (1.626 m)   Wt 176 lb 1.9 oz (79.9 kg)   SpO2 98%   BMI 30.23 kg/m  Wt Readings from Last 3 Encounters:  04/05/23 176 lb 1.9 oz (79.9 kg)  03/05/23 176 lb 1.3 oz  (79.9 kg)  02/18/23 173 lb (78.5 kg)    Lab Results  Component Value Date   TSH 1.700 10/30/2022   Lab Results  Component Value Date   WBC 7.0 10/30/2022   HGB 12.2 10/30/2022   HCT 35.5 10/30/2022   MCV 90 10/30/2022   PLT 181 10/30/2022   Lab Results  Component Value Date   NA 138 10/30/2022   K 4.0 10/30/2022   CO2 25 10/30/2022   GLUCOSE 93 10/30/2022   BUN 16 10/30/2022   CREATININE 0.67 10/30/2022   BILITOT 0.3 10/30/2022   ALKPHOS 77 10/30/2022   AST 18 10/30/2022   ALT 16 10/30/2022   PROT 7.4 10/30/2022   ALBUMIN 4.9 10/30/2022   CALCIUM 10.2 10/30/2022   EGFR 102 10/30/2022   Lab Results  Component Value Date   CHOL 172 10/30/2022   Lab Results  Component Value Date   HDL 55 10/30/2022   Lab Results  Component Value Date   LDLCALC 83 10/30/2022   Lab Results  Component Value Date   TRIG 205 (H) 10/30/2022   Lab Results  Component Value Date   CHOLHDL 3.1 10/30/2022   Lab Results  Component Value Date   HGBA1C 5.4 10/30/2022      Assessment & Plan:  Obesity (BMI 30-39.9) Assessment & Plan: She reports that her exercise regimen include working the yard Daily dietary team intake includes sausages ham and cheese muffin for breakfast, steak a [place] leg for dinner. She reports drinking water and Gatorade minimally  Declines referral to a nutritionist The patient has maintained her weight at 176 pounds Encouraged heart healthy diet with increased physical activity, moderate intensity We will follow-up in 4 weeks     Smoking history Assessment & Plan: She reports started smoking at age of [age] 1 pack a day She quit smoking in 2011 She notes family history of cancer Recent CT scan of the chest showed  posterior right lower lobe has a mean derived diameter of 4.1 mm, with recommendation to follow-up in a year Referral placed to pulmonary for lung cancer screening  Orders: -     Ambulatory Referral for Lung Cancer  Scre    Follow-up: Return in about 1 month (around 05/05/2023).   Gilmore Laroche, FNP

## 2023-04-05 NOTE — Patient Instructions (Signed)
I appreciate the opportunity to provide care to you today!    Follow up:  1 months  Eat three meals per day at times discussed. Cut out all diet bevergages and drink only water Eat whole food plant based meals Cut out junk food, fast food and processed foods Exercise 150 minutes a week Lose 1-2 lbs per week. Keep a food journal Choose foods that grow in a garden or in a fruit orchard and protein of animals with fins or feathers.  Lifestyle Medicine  - Whole Food, Plant Predominant Nutrition is highly recommended: Eat Plenty of vegetables, Mushrooms, fruits, Legumes, Whole Grains, Nuts, seeds in lieu of processed meats, processed snacks/pastries red meat, poultry, eggs.    -It is better to avoid simple carbohydrates including: Cakes, Sweet Desserts, Ice Cream, Soda (diet and regular), Sweet Tea, Candies, Chips, Cookies, Store Bought Juices, Alcohol in Excess of  1-2 drinks a day, Lemonade,  Artificial Sweeteners, Doughnuts, Coffee Creamers, "Sugar-free" Products, etc, etc.  This is not a complete list.....   Exercise: If you are able: 30 -60 minutes a day ,4 days a week, or 150 minutes a week.  The longer the better.  Combine stretch, strength, and aerobic activities.  If you were told in the past that you have high risk for cardiovascular diseases, you may seek evaluation by your heart doctor prior to initiating moderate to intense exercise programs.    Referrals today- Pulmonary for lung cancer screening   Please continue to a heart-healthy diet and increase your physical activities. Try to exercise for at least five days a week.      It was a pleasure to see you and I look forward to continuing to work together on your health and well-being. Please do not hesitate to call the office if you need care or have questions about your care.   Have a wonderful day and week. With Gratitude, Gilmore Laroche MSN, FNP-BC

## 2023-04-06 ENCOUNTER — Encounter: Payer: Self-pay | Admitting: Psychiatry

## 2023-04-06 ENCOUNTER — Other Ambulatory Visit (HOSPITAL_COMMUNITY): Payer: Self-pay

## 2023-04-06 ENCOUNTER — Telehealth (INDEPENDENT_AMBULATORY_CARE_PROVIDER_SITE_OTHER): Payer: HMO | Admitting: Psychiatry

## 2023-04-06 ENCOUNTER — Other Ambulatory Visit: Payer: Self-pay

## 2023-04-06 ENCOUNTER — Encounter: Payer: Self-pay | Admitting: Pharmacist

## 2023-04-06 DIAGNOSIS — G2581 Restless legs syndrome: Secondary | ICD-10-CM | POA: Diagnosis not present

## 2023-04-06 DIAGNOSIS — F3176 Bipolar disorder, in full remission, most recent episode depressed: Secondary | ICD-10-CM | POA: Diagnosis not present

## 2023-04-06 DIAGNOSIS — F5105 Insomnia due to other mental disorder: Secondary | ICD-10-CM

## 2023-04-06 DIAGNOSIS — F411 Generalized anxiety disorder: Secondary | ICD-10-CM

## 2023-04-06 MED ORDER — DIVALPROEX SODIUM ER 500 MG PO TB24
1000.0000 mg | ORAL_TABLET | Freq: Every day | ORAL | 0 refills | Status: DC
  Filled 2023-04-06 – 2023-04-28 (×2): qty 180, 90d supply, fill #0

## 2023-04-06 MED ORDER — DIVALPROEX SODIUM ER 250 MG PO TB24
250.0000 mg | ORAL_TABLET | Freq: Every day | ORAL | 0 refills | Status: DC
  Filled 2023-04-06 – 2023-04-28 (×2): qty 90, 90d supply, fill #0

## 2023-04-06 NOTE — Progress Notes (Signed)
Virtual Visit via Video Note  I connected with Stacy Chambers on 04/06/23 at  8:30 AM EDT by a video enabled telemedicine application and verified that I am speaking with the correct person using two identifiers.  Location Provider Location : [facility] Patient Location : Home  Participants: Patient , Provider    I discussed the limitations of evaluation and management by telemedicine and the availability of in person appointments. The patient expressed understanding and agreed to proceed.   I discussed the assessment and treatment plan with the patient. The patient was provided an opportunity to ask questions and all were answered. The patient agreed with the plan and demonstrated an understanding of the instructions.   The patient was advised to call back or seek an in-person evaluation if the symptoms worsen or if the condition fails to improve as anticipated.   BH MD OP Progress Note  04/06/2023 8:56 AM Stacy Chambers  MRN:  [id number]  Chief Complaint:  Chief Complaint  Patient presents with   Follow-up   Anxiety   Depression   Medication Refill   HPI: Stacy Chambers is a 57 year old Caucasian female, lives in [place], has a history of bipolar disorder, GAD, insomnia, bereavement, RLS, MCI, vitamin B12 deficiency was evaluated by telemedicine today.  Patient today reports she is currently doing better than last visit.  Her anxiety is under control.  She feels happier.  Reports she did not tolerate the sertraline.  Developed side effects including weight gain and sexual side effects.  She hence stopped it after a week of taking it.  She went back on the Lexapro 20 mg.  Currently doing well on that.  Denies side effects.  Reports sleep and appetite as good.  Reports she has good support system from her boyfriend.  She spends a lot of time with her granddaughter who is 52 years old.  She also has a garden and she spends a lot of time in bed.  Patient denies any suicidality,  homicidality or perceptual disturbances.  Patient appeared to be alert, oriented to person place time situation.  Patient reports her hearing has improved.  She reports her hearing loss likely may have been due to seasonal allergies.  She did follow-up with her provider.  Patient denies any other concerns today.  Visit Diagnosis:    ICD-10-CM   1. Bipolar disorder, in full remission, most recent episode depressed (HCC)  F31.76 divalproex (DEPAKOTE ER) 250 MG 24 hr tablet    2. GAD (generalized anxiety disorder)  F41.1 divalproex (DEPAKOTE ER) 500 MG 24 hr tablet    3. Insomnia due to mental disorder  F51.05    Anxiety    4. RLS (restless legs syndrome)  G25.81       Past Psychiatric History: I have reviewed past psychiatric history from progress note on 10/12/2018.  Past trials of medications like BuSpar, Zoloft, carbamazepine, Vraylar, Zyprexa, Wellbutrin, Lamictal, Lexapro, gabapentin, risperidone, Geodon.  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   Chronic obstructive pulmonary disease (HCC) 10/03/2015   COPD (chronic obstructive pulmonary disease) (HCC)  no inhalers   Elevated liver function tests 11/26/2004   H/O NEGATIVE LIVER BIOPSY, workup negative   Gallstones 02/21/2015   GERD (gastroesophageal reflux disease)    Hyperlipidemia    Insomnia    Lower back pain    Osteoarthritis    Ovarian cancer (HCC) 1995   RLS (restless legs syndrome)    Sedative, hypnotic or anxiolytic dependence with withdrawal, 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HCC)    Wears dentures    full upper and lower    Past Surgical History:  Procedure Laterality Date   ABDOMINAL HYSTERECTOMY      APPENDECTOMY     BREAST BIOPSY Left 03/12/2015   fat necrosis   CESAREAN SECTION     COLONOSCOPY WITH PROPOFOL N/A 05/30/2018   Procedure: COLONOSCOPY WITH PROPOFOL;  Surgeon: Scot Jun, MD;  Location: [facility];  Service: Endoscopy;  Laterality: N/A;   EXCISION MORTON'S NEUROMA Left 10/07/2016   Procedure: EXCISION MORTON'S NEUROMA;  Surgeon: Gwyneth Revels, DPM;  Location: [facility];  Service: Podiatry;  Laterality: Left;  IV WITH LOCAL   LIVER BIOPSY  2005   PerPt: Done to eval elevated LFTs and biopsy provided no definite dx/cause of elevated LFTs   MULTIPLE TOOTH EXTRACTIONS     OOPHORECTOMY     OSTECTOMY Right 04/10/2020   Procedure: DOUBLE OSTEOTOMY GENERAL W/ LOCAL;  Surgeon: Gwyneth Revels, DPM;  Location: [facility];  Service: Podiatry;  Laterality: Right;    Family Psychiatric History: Reviewed family psych history from progress note on 10/12/2018. Family History:  Family History  Adopted: Yes  Problem Relation Age of Onset   COPD Mother    Depression Mother    Colon cancer Mother        unsure of age   Cancer Father        Lung CA, Skin Cancer--?type   COPD Father    Stroke Father    Cancer Sister 82       cervical cancer   COPD Brother    Alcohol abuse Brother    Stroke Brother    COPD Maternal Aunt    Depression Daughter    Schizophrenia Son    Breast cancer Neg Hx     Social History: Reviewed social history from progress note on 10/12/2018. Social History   Socioeconomic History   Marital status: Widowed    Spouse name: Not on file   Number of children: Not on file   Years of education: Not on file   Highest education level: 10th grade  Occupational History   Not on file  Tobacco Use   Smoking status: Former    Packs/day: 1.00    Years: 30.00    Additional pack years: 0.00    Total pack years: 30.00    Types: Cigarettes    Quit date: 04/08/2009    Years since quitting: 14.0   Smokeless tobacco: Never  Vaping Use    Vaping Use: Never used  Substance and Sexual Activity   Alcohol use: Not Currently    Alcohol/week: 3.0 - 7.0 standard drinks of alcohol    Types: 3 - 7 Shots of liquor per week    Comment: occasionally   Drug use: Not Currently    Types: Marijuana    Comment: told to refrain until after surgery   Sexual activity: Yes    Birth control/protection: Surgical  Other Topics Concern   Not on file  Social History Narrative   Not on file   Social Determinants of Health  Financial Resource Strain: Medium Risk (02/02/2023)   Overall Financial Resource Strain (CARDIA)    Difficulty of Paying Living Expenses: Somewhat hard  Food Insecurity: Food Insecurity Present (02/02/2023)   Hunger Vital Sign    Worried About Running Out of Food in the Last Year: Sometimes true    Ran Out of Food in the Last Year: Sometimes true  Transportation Needs: No Transportation Needs (02/02/2023)   PRAPARE - Administrator, Civil Service (Medical): No    Lack of Transportation (Non-Medical): No  Physical Activity: Sufficiently Active (02/02/2023)   Exercise Vital Sign    Days of Exercise per Week: 3 days    Minutes of Exercise per Session: 100 min  Stress: Stress Concern Present (02/02/2023)   [employer] of Occupational Health - Occupational Stress Questionnaire    Feeling of Stress : To some extent  Social Connections: Unknown (02/02/2023)   Social Connection and Isolation Panel [NHANES]    Frequency of Communication with Friends and Family: Patient declined    Frequency of Social Gatherings with Friends and Family: Patient declined    Attends Religious Services: Never    Database administrator or Organizations: No    Attends Engineer, structural: Not on file    Marital Status: Widowed    Allergies:  Allergies  Allergen Reactions   Benadryl [Diphenhydramine Hcl]     Hives/ rapid heartrate   Codeine     dizziness   Vicodin [Hydrocodone-Acetaminophen] Other (See Comments)     dizziness    Metabolic Disorder Labs: Lab Results  Component Value Date   HGBA1C 5.4 10/30/2022   MPG 114 09/14/2018   No results found for: "PROLACTIN" Lab Results  Component Value Date   CHOL 172 10/30/2022   TRIG 205 (H) 10/30/2022   HDL 55 10/30/2022   CHOLHDL 3.1 10/30/2022   VLDL 18 03/04/2015   LDLCALC 83 10/30/2022   LDLCALC 56 03/30/2022   Lab Results  Component Value Date   TSH 1.700 10/30/2022   TSH 2.600 03/30/2022    Therapeutic Level Labs: Lab Results  Component Value Date   LITHIUM 0.3 (L) 10/24/2018   LITHIUM 0.49 (L) 01/13/2009   Lab Results  Component Value Date   VALPROATE 70 11/11/2022   VALPROATE 29 (L) 06/02/2022   No results found for: "CBMZ"  Current Medications: Current Outpatient Medications  Medication Sig Dispense Refill   escitalopram (LEXAPRO) 20 MG tablet Take 20 mg by mouth daily.     clonazePAM (KLONOPIN) 0.5 MG tablet Take 1 tablet (0.5 mg total) by mouth 2 to 3  times a week as directed for severe anxiety attacks. 6 tablet 1   divalproex (DEPAKOTE ER) 250 MG 24 hr tablet Take 1 tablet (250 mg total) by mouth daily along with 1000 mg for 1250 mg daily. 90 tablet 0   divalproex (DEPAKOTE ER) 500 MG 24 hr tablet Take 2 tablets (1,000 mg total) by mouth daily. Take along with 250 mg daily - total of 1250 mg 180 tablet 0   esomeprazole (NEXIUM) 20 MG capsule Take 1 capsule (20 mg total) by mouth daily as needed (take on empty stomach). 90 capsule 1   fluticasone (FLONASE) 50 MCG/ACT nasal spray Place 2 sprays into both nostrils daily. 16 mL 2   hydrOXYzine (ATARAX) 50 MG tablet TAKE 1 TABLET TWICE DAILY FOR SEVERE ANXIETY ATTACKS AS NEEDED 180 tablet 1   meloxicam (MOBIC) 15 MG tablet Take 1 tablet (15 mg total) by mouth  daily. 90 tablet 1   methocarbamol (ROBAXIN) 500 MG tablet TAKE 1 TABLET EVERY DAY 60 tablet 3   pantoprazole (PROTONIX) 20 MG tablet Take 1 tablet (20 mg total) by mouth daily. 30 tablet 1   rosuvastatin (CRESTOR) 10  MG tablet Take 1 tablet (10 mg total) by mouth daily. 90 tablet 1   traZODone (DESYREL) 100 MG tablet Take 1-2 tablets (100-200 mg total) by mouth at bedtime as needed for sleep. 180 tablet 1   Vitamin D, Ergocalciferol, (DRISDOL) 1.25 MG (50000 UNIT) CAPS capsule Take 1 capsule (50,000 Units total) by mouth every 7 (seven) days. 20 capsule 3   No current facility-administered medications for this visit.     Musculoskeletal: Strength & Muscle Tone:  UTA Gait & Station:  Seated Patient leans: N/A  Psychiatric Specialty Exam: Review of Systems  Psychiatric/Behavioral: Negative.      There were no vitals taken for this visit.There is no height or weight on file to calculate BMI.  General Appearance: Fairly Groomed  Eye Contact:  Fair  Speech:  Clear and Coherent  Volume:  Normal  Mood:  Euthymic  Affect:  Congruent  Thought Process:  Goal Directed and Descriptions of Associations: Intact  Orientation:  Full (Time, Place, and Person)  Thought Content: Logical   Suicidal Thoughts:  No  Homicidal Thoughts:  No  Memory:  Immediate;   Fair Recent;   Fair Remote;   Fair  Judgement:  Fair  Insight:  Fair  Psychomotor Activity:  Normal  Concentration:  Concentration: Fair and Attention Span: Fair  Recall:  [employer] of Knowledge: Fair  Language: Fair  Akathisia:  No  Handed:  Left  AIMS (if indicated):  done  Assets:  Communication Skills Desire for Improvement Housing Social Support Talents/Skills Transportation  ADL's:  Intact  Cognition: WNL  Sleep:  Fair   Screenings: AIMS    Flowsheet Row Video Visit from 04/06/2023 in [facility] Office Visit from 11/23/2022 in [facility] Video Visit from 06/01/2022 in [facility] Video Visit from 03/24/2022 in [facility] Video Visit from 06/04/2021 in [facility]  AIMS Total Score 0 0 0 0 0      GAD-7    Flowsheet Row Office Visit from 04/05/2023 in [facility] Office Visit from 03/05/2023 in [facility] Video Visit from 02/15/2023 in [facility] Office Visit from 02/05/2023 in [facility] Office Visit from 11/23/2022 in [facility]  Total GAD-7 Score 7 0 0 15 2      PHQ2-9    Flowsheet Row Office Visit from 04/05/2023 in [facility] Office Visit from 03/05/2023 in [facility] Video Visit from 02/15/2023 in [facility] Office Visit from 02/05/2023 in [facility] Office Visit from 11/23/2022 in [facility]  PHQ-2 Total Score 0 0 0 0 0  PHQ-9 Total Score 7 0 0 6 --      Flowsheet Row Video Visit from 04/06/2023 in [facility] Office Visit from 02/17/2023 in [facility] Office Visit from 11/23/2022 in [facility]  C-SSRS RISK CATEGORY No Risk No Risk No Risk  Assessment and Plan: Stacy Chambers is a 57 year old Caucasian female, widowed, lives in [place], has a history of bipolar disorder, anxiety disorder, COPD, RLS, vitamin B12 deficiency was evaluated by telemedicine today.  Patient did not tolerate sertraline and is currently back on Lexapro, reports mood symptoms are stable.  Plan as noted below.  Plan Bipolar disorder in remission Depakote ER 1250 mg p.o. daily Depakote level-11/11/2022-70-therapeutic. Trazodone 100-200 mg p.o. nightly as needed Hydroxyzine 50 mg at bedtime as needed for sleep  GAD-stable Lexapro 20 mg p.o. daily Klonopin 0.5 mg up to 2-3 times a week only for severe anxiety. Discontinue sertraline,  had side effects. Continue CBT with Mr. Suzan Garibaldi  RLS-stable Will monitor closely.  Follow-up in clinic in 2 months or sooner if needed.   Consent: Patient/Guardian gives verbal consent for treatment and assignment of benefits for services provided during this visit. Patient/Guardian expressed understanding and agreed to proceed.   This note was generated in part or whole with voice recognition software. Voice recognition is usually quite accurate but there are transcription errors that can and very often do occur. I apologize for any typographical errors that were not detected and corrected.    Jomarie Longs, MD 04/06/2023, 8:56 AM

## 2023-04-09 ENCOUNTER — Other Ambulatory Visit: Payer: Self-pay

## 2023-04-09 ENCOUNTER — Other Ambulatory Visit (HOSPITAL_COMMUNITY): Payer: Self-pay

## 2023-04-09 ENCOUNTER — Ambulatory Visit (INDEPENDENT_AMBULATORY_CARE_PROVIDER_SITE_OTHER): Payer: HMO | Admitting: Clinical

## 2023-04-09 DIAGNOSIS — F3131 Bipolar disorder, current episode depressed, mild: Secondary | ICD-10-CM

## 2023-04-09 DIAGNOSIS — F411 Generalized anxiety disorder: Secondary | ICD-10-CM | POA: Diagnosis not present

## 2023-04-09 DIAGNOSIS — F5105 Insomnia due to other mental disorder: Secondary | ICD-10-CM | POA: Diagnosis not present

## 2023-04-09 NOTE — Progress Notes (Signed)
Virtual Visit via Video Note   I connected with Stacy Chambers on 04/09/23 at  9:00 AM EST by a video enabled telemedicine application and verified that I am speaking with the correct person using two identifiers.   Location: Patient: Home Provider: Office   I discussed the limitations of evaluation and management by telemedicine and the availability of in person appointments. The patient expressed understanding and agreed to proceed.     THERAPIST PROGRESS NOTE   Session Time: 9:00 AM-9:30 AM   Participation Level: Active   Behavioral Response: CasualAlertDepressed   Type of Therapy: Individual Therapy   Treatment Goals addressed: Coping   Interventions: CBT   Summary: Stacy Chambers is a 57 y.o. female who presents with Bipolar Disorder./ GAD/Insomnia. The OPT therapist worked with the patient for her ongoing OPT treatment session. The OPT therapist utilized Motivational Interviewing to assist in creating therapeutic repore. The patient in the session was engaged and work in collaboration giving feedback about her triggers and symptoms over the past few weeks.The patient spoke about getting a new pet , spending time with her grandaughter celebrating her birthday, and using the weather to plant flowers. The patient spoke about her last appointment with Dr. Elna Breslow and noted her Zoloft is working well. The OPT therapist utilized Cognitive Behavioral Therapy through cognitive restructuring as well as worked with the patient on coping strategies to assist in management of mood. The OPT therapist worked with the patient on continuing to maintain her basic needs of self care with eating, hygiene, and sleep. The patient notes she is doing well with her interactions with others in the home.The patient spoke about her current relationship and noted things are going well. 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 The patient spoke about recently going to  [facility] for a vacation.   Suicidal/Homicidal: Nowithout intent/plan   Therapist Response: The OPT therapist worked with the patient for the patients scheduled session. The patient was engaged in her session and gave feedback in relation to triggers, symptoms, and behavior responses over the past few weeks. The OPT therapist worked with the patient utilizing an in session Cognitive Behavioral Therapy exercise. The patient was responsive in the session and verbalized," I really enjoyed the beach trip and It was very relaxing and we had good weather and it was cheaper because it was still in the off season". The OPT therapist worked with the patient on staying active , managing her basic health needs, and being consistent with her medication therapy plan. The OPT therapist worked with the patient gauging activity level, coping skill implementation, and mood. The patient spoke about concern around her boyfriends workplace and him overworking due to other staff not coming in to work and her concern her boyfriends job/ company may not stay open.The OPT therapist will continue treatment work with the patient in her next scheduled session    Plan: Return again in 3 weeks.   Diagnosis:      Axis I: Bipolar 1 disorder,depressed mild/GAD/Insomnia/ Attention deficit.                             Axis II: No diagnosis    Collaboration of Care: Collaboration of care reviewed with medication therapy involvement with psychiatrist Dr. Elna Breslow.   Patient/Guardian was advised Release of Information must be obtained prior to any record release in order to collaborate their care with an  outside provider. Patient/Guardian was advised if they have not already done so to contact the registration department to sign all necessary forms in order for [name] to release information regarding their care.    Consent: Patient/Guardian gives verbal consent for treatment and assignment of benefits for services provided during this visit.  Patient/Guardian expressed understanding and agreed to proceed.        I discussed the assessment and treatment plan with the patient. The patient was provided an opportunity to ask questions and all were answered. The patient agreed with the plan and demonstrated an understanding of the instructions.   The patient was advised to call back or seek an in-person evaluation if the symptoms worsen or if the condition fails to improve as anticipated.   I provided 30 minutes of non-face-to-face time during this encounter.   [name], LCSW   04/09/2023

## 2023-04-28 ENCOUNTER — Other Ambulatory Visit: Payer: Self-pay

## 2023-05-05 ENCOUNTER — Ambulatory Visit: Payer: HMO | Admitting: Family Medicine

## 2023-05-11 ENCOUNTER — Encounter: Payer: Self-pay | Admitting: *Deleted

## 2023-05-11 ENCOUNTER — Other Ambulatory Visit: Payer: Self-pay

## 2023-05-11 ENCOUNTER — Ambulatory Visit (INDEPENDENT_AMBULATORY_CARE_PROVIDER_SITE_OTHER): Payer: HMO | Admitting: Family Medicine

## 2023-05-11 ENCOUNTER — Encounter: Payer: Self-pay | Admitting: Family Medicine

## 2023-05-11 VITALS — BP 97/68 | HR 71 | Ht 64.0 in | Wt 167.1 lb

## 2023-05-11 DIAGNOSIS — E559 Vitamin D deficiency, unspecified: Secondary | ICD-10-CM | POA: Diagnosis not present

## 2023-05-11 DIAGNOSIS — M545 Low back pain, unspecified: Secondary | ICD-10-CM | POA: Insufficient documentation

## 2023-05-11 DIAGNOSIS — E785 Hyperlipidemia, unspecified: Secondary | ICD-10-CM | POA: Diagnosis not present

## 2023-05-11 DIAGNOSIS — Z1231 Encounter for screening mammogram for malignant neoplasm of breast: Secondary | ICD-10-CM

## 2023-05-11 DIAGNOSIS — Z1211 Encounter for screening for malignant neoplasm of colon: Secondary | ICD-10-CM

## 2023-05-11 DIAGNOSIS — G8929 Other chronic pain: Secondary | ICD-10-CM | POA: Diagnosis not present

## 2023-05-11 DIAGNOSIS — E669 Obesity, unspecified: Secondary | ICD-10-CM | POA: Diagnosis not present

## 2023-05-11 DIAGNOSIS — J301 Allergic rhinitis due to pollen: Secondary | ICD-10-CM

## 2023-05-11 MED ORDER — FLUTICASONE PROPIONATE 50 MCG/ACT NA SUSP
2.0000 | Freq: Every day | NASAL | 2 refills | Status: DC
  Filled 2023-05-11: qty 16, 30d supply, fill #0
  Filled 2023-09-07: qty 16, 30d supply, fill #1
  Filled 2023-10-14: qty 16, 30d supply, fill #2

## 2023-05-11 MED ORDER — MELOXICAM 15 MG PO TABS
15.0000 mg | ORAL_TABLET | Freq: Every day | ORAL | 1 refills | Status: DC
  Filled 2023-05-11: qty 90, 90d supply, fill #0
  Filled 2023-06-03 – 2023-09-12 (×5): qty 90, 90d supply, fill #1

## 2023-05-11 MED ORDER — ROSUVASTATIN CALCIUM 10 MG PO TABS
10.0000 mg | ORAL_TABLET | Freq: Every day | ORAL | 1 refills | Status: DC
  Filled 2023-05-11: qty 90, 90d supply, fill #0
  Filled 2023-06-27 – 2023-07-28 (×2): qty 90, 90d supply, fill #1

## 2023-05-11 MED ORDER — VITAMIN D (ERGOCALCIFEROL) 1.25 MG (50000 UNIT) PO CAPS
50000.0000 [IU] | ORAL_CAPSULE | ORAL | 3 refills | Status: DC
  Filled 2023-05-11: qty 12, 84d supply, fill #0
  Filled 2023-06-27 – 2023-07-16 (×2): qty 12, 84d supply, fill #1

## 2023-05-11 NOTE — Assessment & Plan Note (Addendum)
The patient reports working working in the garden where she does a lot of bending and lifting She c/o left side lumbar pain Pain is nonradiating No muscle weakness or numbness reported No bowel and bladder incontinence Negative CVA tenderness Mild tenderness to palpation of the left lumbar spine Encouraged to continue taking meloxicam Will get an x-ray of the lower back Declines a referral to PT at this time

## 2023-05-11 NOTE — Assessment & Plan Note (Signed)
The patient has lost 9 lbs  Reports adhering to a heart healthy diet Will f/u in a month Wt Readings from Last 3 Encounters:  05/11/23 167 lb 1.9 oz (75.8 kg)  04/05/23 176 lb 1.9 oz (79.9 kg)  03/05/23 176 lb 1.3 oz (79.9 kg)

## 2023-05-11 NOTE — Patient Instructions (Addendum)
I appreciate the opportunity to provide care to you today!    Follow up:  1 months   Please schedule medicare annual wellness  Low back pain:  -I recommend rest, heat application to the lower back and continue talking meloxicam for pain relief. Avoid aggravating activities.  -Please stop by [facility] to get an x-ray of your lower back    Please stop by your local pharmacy and get your Shingles vaccine  Referrals today- GI for colonoscopy   Attached with your AVS, you will find valuable resources for self-education. I highly recommend dedicating some time to thoroughly examine them.   Please continue to a heart-healthy diet and increase your physical activities. Try to exercise for at least five days a week.    It was a pleasure to see you and I look forward to continuing to work together on your health and well-being. Please do not hesitate to call the office if you need care or have questions about your care.  In case of emergency, please visit the Emergency Department for urgent care, or contact our clinic at [phone] to schedule an appointment. We're here to help you!   Have a wonderful day and week. With Gratitude, Gilmore Laroche MSN, FNP-BC

## 2023-05-11 NOTE — Progress Notes (Signed)
Established Patient Office Visit  Subjective:  Patient ID: Stacy Chambers, female    DOB: April 13, 1966  Age: 57 y.o. MRN: [id number]  CC:  Chief Complaint  Patient presents with   Obesity    Follow up lost 9 lbs since last visit.   Back Pain    Pt reports tailbone on her back hurting ongoing since previous visit.     HPI Stacy Chambers is a 57 y.o. female with past medical history of obesity presents for f/u of  chronic medical conditions. For the details of today's visit, please refer to the assessment and plan. 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   Chronic obstructive pulmonary disease (HCC) 10/03/2015   COPD (chronic obstructive pulmonary disease) (HCC)    no inhalers   Elevated liver function tests 11/26/2004   H/O NEGATIVE LIVER BIOPSY, workup negative   Gallstones 02/21/2015   GERD (gastroesophageal reflux disease)    Hyperlipidemia    Insomnia    Lower back pain    Osteoarthritis    Ovarian cancer (HCC) 1995   RLS (restless legs syndrome)    Sedative, hypnotic or anxiolytic dependence with withdrawal, 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HCC)    Wears dentures    full upper and lower    Past Surgical History:  Procedure Laterality Date   ABDOMINAL HYSTERECTOMY     APPENDECTOMY     BREAST BIOPSY Left 03/12/2015   fat necrosis   CESAREAN SECTION     COLONOSCOPY WITH PROPOFOL N/A 05/30/2018   Procedure: COLONOSCOPY WITH PROPOFOL;  Surgeon: Scot Jun, MD;  Location: [facility] ENDOSCOPY;  Service: Endoscopy;  Laterality: N/A;   EXCISION MORTON'S NEUROMA Left 10/07/2016   Procedure: EXCISION MORTON'S NEUROMA;  Surgeon: Gwyneth Revels, DPM;  Location: [facility];   Service: Podiatry;  Laterality: Left;  IV WITH LOCAL   LIVER BIOPSY  2005   PerPt: Done to eval elevated LFTs and biopsy provided no definite dx/cause of elevated LFTs   MULTIPLE TOOTH EXTRACTIONS     OOPHORECTOMY     OSTECTOMY Right 04/10/2020   Procedure: DOUBLE OSTEOTOMY GENERAL W/ LOCAL;  Surgeon: Gwyneth Revels, DPM;  Location: [facility];  Service: Podiatry;  Laterality: Right;    Family History  Adopted: Yes  Problem Relation Age of Onset   COPD Mother    Depression Mother    Colon cancer Mother        unsure of age   Cancer Father        Lung CA, Skin Cancer--?type   COPD Father    Stroke Father    Cancer Sister 39       cervical cancer   COPD Brother    Alcohol abuse Brother    Stroke Brother    COPD Maternal Aunt    Depression Daughter    Schizophrenia Son    Breast cancer Neg Hx     Social History   Socioeconomic History   Marital status: Widowed    Spouse name: Not on file   Number of children: Not on file   Years of education: Not on file  Highest education level: 10th grade  Occupational History   Not on file  Tobacco Use   Smoking status: Former    Current packs/day: 0.00    Average packs/day: 1 pack/day for 30.0 years (30.0 ttl pk-yrs)    Types: Cigarettes    Start date: 04/09/1979    Quit date: 04/08/2009    Years since quitting: 14.0   Smokeless tobacco: Never  Vaping Use   Vaping status: Never Used  Substance and Sexual Activity   Alcohol use: Not Currently    Alcohol/week: 3.0 - 7.0 standard drinks of alcohol    Types: 3 - 7 Shots of liquor per week    Comment: occasionally   Drug use: Not Currently    Types: Marijuana    Comment: told to refrain until after surgery   Sexual activity: Yes    Birth control/protection: Surgical  Other Topics Concern   Not on file  Social History Narrative   Not on file   Social Determinants of Health   Financial Resource Strain: Medium Risk (02/02/2023)   Overall Financial Resource Strain  (CARDIA)    Difficulty of Paying Living Expenses: Somewhat hard  Food Insecurity: Food Insecurity Present (02/02/2023)   Hunger Vital Sign    Worried About Running Out of Food in the Last Year: Sometimes true    Ran Out of Food in the Last Year: Sometimes true  Transportation Needs: No Transportation Needs (02/02/2023)   PRAPARE - Administrator, Civil Service (Medical): No    Lack of Transportation (Non-Medical): No  Physical Activity: Sufficiently Active (02/02/2023)   Exercise Vital Sign    Days of Exercise per Week: 3 days    Minutes of Exercise per Session: 100 min  Stress: Stress Concern Present (02/02/2023)   [employer] of Occupational Health - Occupational Stress Questionnaire    Feeling of Stress : To some extent  Social Connections: Unknown (02/02/2023)   Social Connection and Isolation Panel [NHANES]    Frequency of Communication with Friends and Family: Patient declined    Frequency of Social Gatherings with Friends and Family: Patient declined    Attends Religious Services: Never    Database administrator or Organizations: No    Attends Engineer, structural: Not on file    Marital Status: Widowed  Intimate Partner Violence: Not on file    Outpatient Medications Prior to Visit  Medication Sig Dispense Refill   clonazePAM (KLONOPIN) 0.5 MG tablet Take 1 tablet (0.5 mg total) by mouth 2 to 3  times a week as directed for severe anxiety attacks. 6 tablet 1   divalproex (DEPAKOTE ER) 500 MG 24 hr tablet Take 2 tablets (1,000 mg total) by mouth daily. Take along with 250 mg daily - total of 1250 mg 180 tablet 0   escitalopram (LEXAPRO) 20 MG tablet Take 20 mg by mouth daily.     esomeprazole (NEXIUM) 20 MG capsule Take 1 capsule (20 mg total) by mouth daily as needed (take on empty stomach). 90 capsule 1   hydrOXYzine (ATARAX) 50 MG tablet TAKE 1 TABLET TWICE DAILY FOR SEVERE ANXIETY ATTACKS AS NEEDED 180 tablet 1   methocarbamol (ROBAXIN) 500 MG tablet TAKE  1 TABLET EVERY DAY 60 tablet 3   pantoprazole (PROTONIX) 20 MG tablet Take 1 tablet (20 mg total) by mouth daily. 30 tablet 1   traZODone (DESYREL) 100 MG tablet Take 1-2 tablets (100-200 mg total) by mouth at bedtime as needed for sleep. 180 tablet  1   fluticasone (FLONASE) 50 MCG/ACT nasal spray Place 2 sprays into both nostrils daily. 16 mL 2   meloxicam (MOBIC) 15 MG tablet Take 1 tablet (15 mg total) by mouth daily. 90 tablet 1   Vitamin D, Ergocalciferol, (DRISDOL) 1.25 MG (50000 UNIT) CAPS capsule Take 1 capsule (50,000 Units total) by mouth every 7 (seven) days. 20 capsule 3   divalproex (DEPAKOTE ER) 250 MG 24 hr tablet Take 1 tablet (250 mg total) by mouth daily along with 1000 mg for 1250 mg daily. 90 tablet 0   rosuvastatin (CRESTOR) 10 MG tablet Take 1 tablet (10 mg total) by mouth daily. 90 tablet 1   No facility-administered medications prior to visit.    Allergies  Allergen Reactions   Benadryl [Diphenhydramine Hcl]     Hives/ rapid heartrate   Codeine     dizziness   Vicodin [Hydrocodone-Acetaminophen] Other (See Comments)    dizziness    ROS Review of Systems  Constitutional:  Negative for chills and fever.  Eyes:  Negative for visual disturbance.  Respiratory:  Negative for chest tightness and shortness of breath.   Neurological:  Negative for dizziness and headaches.      Objective:    Physical Exam HENT:     Head: Normocephalic.     Mouth/Throat:     Mouth: Mucous membranes are moist.  Cardiovascular:     Rate and Rhythm: Normal rate.     Heart sounds: Normal heart sounds.  Pulmonary:     Effort: Pulmonary effort is normal.     Breath sounds: Normal breath sounds.  Neurological:     Mental Status: She is alert.     BP 97/68   Pulse 71   Ht 5\' 4"  (1.626 m)   Wt 167 lb 1.9 oz (75.8 kg)   SpO2 92%   BMI 28.69 kg/m  Wt Readings from Last 3 Encounters:  05/11/23 167 lb 1.9 oz (75.8 kg)  04/05/23 176 lb 1.9 oz (79.9 kg)  03/05/23 176 lb 1.3 oz  (79.9 kg)    Lab Results  Component Value Date   TSH 1.700 10/30/2022   Lab Results  Component Value Date   WBC 7.0 10/30/2022   HGB 12.2 10/30/2022   HCT 35.5 10/30/2022   MCV 90 10/30/2022   PLT 181 10/30/2022   Lab Results  Component Value Date   NA 138 10/30/2022   K 4.0 10/30/2022   CO2 25 10/30/2022   GLUCOSE 93 10/30/2022   BUN 16 10/30/2022   CREATININE 0.67 10/30/2022   BILITOT 0.3 10/30/2022   ALKPHOS 77 10/30/2022   AST 18 10/30/2022   ALT 16 10/30/2022   PROT 7.4 10/30/2022   ALBUMIN 4.9 10/30/2022   CALCIUM 10.2 10/30/2022   EGFR 102 10/30/2022   Lab Results  Component Value Date   CHOL 172 10/30/2022   Lab Results  Component Value Date   HDL 55 10/30/2022   Lab Results  Component Value Date   LDLCALC 83 10/30/2022   Lab Results  Component Value Date   TRIG 205 (H) 10/30/2022   Lab Results  Component Value Date   CHOLHDL 3.1 10/30/2022   Lab Results  Component Value Date   HGBA1C 5.4 10/30/2022      Assessment & Plan:  Left lumbar pain Assessment & Plan: The patient reports working working in the garden where she does a lot of bending and lifting She c/o left side lumbar pain Pain is nonradiating No muscle weakness  or numbness reported No bowel and bladder incontinence Negative CVA tenderness Mild tenderness to palpation of the left lumbar spine Encouraged to continue taking meloxicam Will get an x-ray of the lower back Declines a referral to PT at this time     Orders: -     DG Lumbar Spine Complete  Other chronic pain -     Meloxicam; Take 1 tablet (15 mg total) by mouth daily.  Dispense: 90 tablet; Refill: 1  Obesity (BMI 30-39.9) Assessment & Plan: The patient has lost 9 lbs  Reports adhering to a heart healthy diet Will f/u in a month Wt Readings from Last 3 Encounters:  05/11/23 167 lb 1.9 oz (75.8 kg)  04/05/23 176 lb 1.9 oz (79.9 kg)  03/05/23 176 lb 1.3 oz (79.9 kg)        Dyslipidemia -      Rosuvastatin Calcium; Take 1 tablet (10 mg total) by mouth daily.  Dispense: 90 tablet; Refill: 1  Vitamin D deficiency -     Vitamin D (Ergocalciferol); Take 1 capsule (50,000 Units total) by mouth every 7 (seven) days.  Dispense: 20 capsule; Refill: 3  Breast cancer screening by mammogram -     3D Screening Mammogram, Left and Right  Colon cancer screening -     Ambulatory referral to Gastroenterology  Seasonal allergic rhinitis due to pollen -     Fluticasone Propionate; Place 2 sprays into both nostrils daily.  Dispense: 16 mL; Refill: 2  Note: This chart has been completed using Engineer, civil (consulting) software, and while attempts have been made to ensure accuracy, certain words and phrases may not be transcribed as intended.    Follow-up: Return in about 1 month (around 06/11/2023).   Gilmore Laroche, FNP

## 2023-05-14 ENCOUNTER — Ambulatory Visit (HOSPITAL_COMMUNITY): Payer: HMO | Admitting: Clinical

## 2023-05-14 ENCOUNTER — Encounter (HOSPITAL_COMMUNITY): Payer: Self-pay

## 2023-05-17 ENCOUNTER — Ambulatory Visit (HOSPITAL_COMMUNITY): Payer: HMO

## 2023-05-25 ENCOUNTER — Ambulatory Visit (INDEPENDENT_AMBULATORY_CARE_PROVIDER_SITE_OTHER): Payer: HMO | Admitting: Clinical

## 2023-05-25 DIAGNOSIS — G4700 Insomnia, unspecified: Secondary | ICD-10-CM | POA: Diagnosis not present

## 2023-05-25 DIAGNOSIS — F411 Generalized anxiety disorder: Secondary | ICD-10-CM

## 2023-05-25 DIAGNOSIS — F5105 Insomnia due to other mental disorder: Secondary | ICD-10-CM

## 2023-05-25 DIAGNOSIS — F3131 Bipolar disorder, current episode depressed, mild: Secondary | ICD-10-CM

## 2023-05-25 DIAGNOSIS — F988 Other specified behavioral and emotional disorders with onset usually occurring in childhood and adolescence: Secondary | ICD-10-CM | POA: Diagnosis not present

## 2023-05-25 NOTE — Progress Notes (Signed)
Virtual Visit via Video Note   I connected with Stacy Chambers on 05/25/23 at  3:00 PM EST by a video enabled telemedicine application and verified that I am speaking with the correct person using two identifiers.   Location: Patient: Home Provider: Office   I discussed the limitations of evaluation and management by telemedicine and the availability of in person appointments. The patient expressed understanding and agreed to proceed.     THERAPIST PROGRESS NOTE   Session Time: 3:00 PM-3:30 PM   Participation Level: Active   Behavioral Response: CasualAlertDepressed   Type of Therapy: Individual Therapy   Treatment Goals addressed: Coping   Interventions: CBT   Summary: Stacy Chambers is a 57 y.o. female who presents with Bipolar Disorder./ GAD/Insomnia. The OPT therapist worked with the patient for her ongoing OPT treatment session. The OPT therapist utilized Motivational Interviewing to assist in creating therapeutic repore. The patient in the session was engaged and work in collaboration giving feedback about her triggers and symptoms over the past few weeks.The patient spoke having difficulty with low mood last week and being active in changing her environment and not isolating and using her support system and this helped her to get back to her baseline. The OPT therapist utilized Cognitive Behavioral Therapy through cognitive restructuring as well as worked with the patient on coping strategies to assist in management of mood. The OPT therapist worked with the patient on continuing to maintain her basic needs of self care with eating, hygiene, and sleep. The patient notes she is doing well with her interactions with others in the home.The patient spoke about her current relationship and noted things are going well and hoping to go back to the beach again in August. Additionally the patient is looking to spend time with her grandchild prior to school starting back in August.    Suicidal/Homicidal: Nowithout intent/plan   Therapist Response: The OPT therapist worked with the patient for the patients scheduled session. The patient was engaged in her session and gave feedback in relation to triggers, symptoms, and behavior responses over the past few weeks. The OPT therapist worked with the patient utilizing an in session Cognitive Behavioral Therapy exercise. The patient was responsive in the session and verbalized," I had a bad week last week but I pushed through my low mood and got dressed and got out of the house and that helped". The OPT therapist worked with the patient on staying active , managing her basic health needs, and being consistent with her medication therapy plan. The OPT therapist worked with the patient gauging activity level, coping skill implementation, and mood. The patient spoke about looking forward to spending time with her granddaughter and hopefully going back to the beach before the end of August  .The OPT therapist will continue treatment work with the patient in her next scheduled session    Plan: Return again in 3 weeks.   Diagnosis:      Axis I: Bipolar 1 disorder,depressed mild/GAD/Insomnia/ Attention deficit.                             Axis II: No diagnosis    Collaboration of Care: Collaboration of care reviewed with medication therapy involvement with psychiatrist Dr. Elna Breslow.   Patient/Guardian was advised Release of Information must be obtained prior to any record release in order to collaborate their care with an outside provider. Patient/Guardian was advised if they have not already  done so to contact the registration department to sign all necessary forms in order for [name] to release information regarding their care.    Consent: Patient/Guardian gives verbal consent for treatment and assignment of benefits for services provided during this visit. Patient/Guardian expressed understanding and agreed to proceed.        I discussed the  assessment and treatment plan with the patient. The patient was provided an opportunity to ask questions and all were answered. The patient agreed with the plan and demonstrated an understanding of the instructions.   The patient was advised to call back or seek an in-person evaluation if the symptoms worsen or if the condition fails to improve as anticipated.   I provided 30 minutes of non-face-to-face time during this encounter.   [name], LCSW   05/25/2023

## 2023-05-26 ENCOUNTER — Encounter (HOSPITAL_COMMUNITY): Payer: Self-pay

## 2023-05-26 ENCOUNTER — Ambulatory Visit (HOSPITAL_COMMUNITY)
Admission: RE | Admit: 2023-05-26 | Discharge: 2023-05-26 | Disposition: A | Discharge status: Home / Self Care | Payer: HMO | Source: Ambulatory Visit | Attending: Family Medicine | Admitting: Family Medicine

## 2023-05-26 DIAGNOSIS — G8929 Other chronic pain: Secondary | ICD-10-CM | POA: Diagnosis not present

## 2023-05-26 DIAGNOSIS — M545 Low back pain, unspecified: Secondary | ICD-10-CM | POA: Insufficient documentation

## 2023-05-26 DIAGNOSIS — M47816 Spondylosis without myelopathy or radiculopathy, lumbar region: Secondary | ICD-10-CM | POA: Diagnosis not present

## 2023-05-26 DIAGNOSIS — Z1231 Encounter for screening mammogram for malignant neoplasm of breast: Secondary | ICD-10-CM | POA: Insufficient documentation

## 2023-06-02 ENCOUNTER — Telehealth: Payer: Self-pay | Admitting: Internal Medicine

## 2023-06-02 NOTE — Telephone Encounter (Signed)
Pt's questionnaire is in review.

## 2023-06-03 ENCOUNTER — Other Ambulatory Visit: Payer: Self-pay

## 2023-06-03 ENCOUNTER — Telehealth: Payer: Self-pay | Admitting: *Deleted

## 2023-06-03 ENCOUNTER — Telehealth: Payer: Self-pay | Admitting: Internal Medicine

## 2023-06-03 NOTE — Telephone Encounter (Signed)
Procedure: colonoscopy   Height: 5'4" Weight: 166 lb      BMI: 28.5  Have you had a colonoscopy before?  Yes, 2019, [facility], in epic  Do you have family history of colon cancer?  Yes, mother  Do you have a family history of polyps? yes  Previous colonoscopy with polyps removed? yes  Do you have a history colorectal cancer?   no  Are you diabetic?  no  Do you have a prosthetic or mechanical heart valve? no  Do you have a pacemaker/defibrillator?   no  Have you had endocarditis/atrial fibrillation?  no  Do you use supplemental oxygen/CPAP?  no  Have you had joint replacement within the last 12 months?  no  Do you tend to be constipated or have to use laxatives?  no   Do you have history of alcohol use? If yes, how much and how often.  no  Do you have history or are you using drugs? If yes, what do are you  using?  no  Have you ever had a stroke/heart attack?  no  Have you ever had a heart or other vascular stent placed,? no  Do you take weight loss medication? no  female patients,: have you had a hysterectomy? yes                              are you post menopausal?  no                              do you still have your menstrual cycle? no    Date of last menstrual period? unsure  Do you take any blood-thinning medications such as: (Plavix, aspirin, Coumadin, Aggrenox, Brilinta, Xarelto, Eliquis, Pradaxa, Savaysa or Effient)? no  If yes we need the name, milligram, dosage and who is prescribing doctor:  n/a             Current Outpatient Medications  Medication Sig Dispense Refill   clonazePAM (KLONOPIN) 0.5 MG tablet Take 1 tablet (0.5 mg total) by mouth 2 to 3  times a week as directed for severe anxiety attacks. 6 tablet 1   divalproex (DEPAKOTE ER) 500 MG 24 hr tablet Take 2 tablets (1,000 mg total) by mouth daily. Take along with 250 mg daily - total of 1250 mg 180 tablet 0   escitalopram (LEXAPRO) 20 MG tablet Take 20 mg by mouth daily.     esomeprazole  (NEXIUM) 20 MG capsule Take 1 capsule (20 mg total) by mouth daily as needed (take on empty stomach). 90 capsule 1   fluticasone (FLONASE) 50 MCG/ACT nasal spray Place 2 sprays into both nostrils daily. 16 g 2   hydrOXYzine (ATARAX) 50 MG tablet TAKE 1 TABLET TWICE DAILY FOR SEVERE ANXIETY ATTACKS AS NEEDED 180 tablet 1   meloxicam (MOBIC) 15 MG tablet Take 1 tablet (15 mg total) by mouth daily. 90 tablet 1   methocarbamol (ROBAXIN) 500 MG tablet TAKE 1 TABLET EVERY DAY 60 tablet 3   pantoprazole (PROTONIX) 20 MG tablet Take 1 tablet (20 mg total) by mouth daily. 30 tablet 1   rosuvastatin (CRESTOR) 10 MG tablet Take 1 tablet (10 mg total) by mouth daily. 90 tablet 1   traZODone (DESYREL) 100 MG tablet Take 1-2 tablets (100-200 mg total) by mouth at bedtime as needed for sleep. 180 tablet 1   Vitamin D, Ergocalciferol, (DRISDOL)  1.25 MG (50000 UNIT) CAPS capsule Take 1 capsule (50,000 Units total) by mouth every 7 (seven) days. 20 capsule 3   No current facility-administered medications for this visit.    Allergies  Allergen Reactions   Benadryl [Diphenhydramine Hcl]     Hives/ rapid heartrate   Codeine     dizziness   Vicodin [Hydrocodone-Acetaminophen] Other (See Comments)    dizziness

## 2023-06-03 NOTE — Telephone Encounter (Signed)
Patient left a message that she was sorry she just missed your call.  [phone]

## 2023-06-03 NOTE — Telephone Encounter (Signed)
Pt didn't have any medications listed on questionnaire for colonoscopy. Went over medications with pt. All correct.

## 2023-06-08 ENCOUNTER — Encounter: Payer: Self-pay | Admitting: Psychiatry

## 2023-06-08 ENCOUNTER — Telehealth: Payer: HMO | Admitting: Psychiatry

## 2023-06-08 DIAGNOSIS — Z9189 Other specified personal risk factors, not elsewhere classified: Secondary | ICD-10-CM

## 2023-06-08 DIAGNOSIS — G2581 Restless legs syndrome: Secondary | ICD-10-CM

## 2023-06-08 DIAGNOSIS — F411 Generalized anxiety disorder: Secondary | ICD-10-CM

## 2023-06-08 DIAGNOSIS — F3162 Bipolar disorder, current episode mixed, moderate: Secondary | ICD-10-CM | POA: Diagnosis not present

## 2023-06-08 DIAGNOSIS — F5105 Insomnia due to other mental disorder: Secondary | ICD-10-CM | POA: Diagnosis not present

## 2023-06-08 MED ORDER — DIVALPROEX SODIUM ER 250 MG PO TB24: 250.0000 mg | ORAL_TABLET | Freq: Every day | ORAL | Status: DC

## 2023-06-08 NOTE — Patient Instructions (Addendum)
 Please call for EKG - [phone]   Quetiapine Tablets What is this medication? QUETIAPINE (kwe TYE a peen) treats schizophrenia and bipolar disorder. It works by balancing the levels of dopamine and serotonin in your brain, hormones that help regulate mood, behaviors, and thoughts. It belongs to a group of medications called antipsychotics. Antipsychotic medications can be used to treat several kinds of mental health conditions. This medicine may be used for other purposes; ask your health care provider or pharmacist if you have questions. COMMON BRAND NAME(S): Seroquel What should I tell my care team before I take this medication? They need to know if you have any of these conditions: Blockage in your bowels Cataracts Constipation Dementia Diabetes Difficulty swallowing Glaucoma Heart disease High levels of prolactin History of breast cancer History of irregular heartbeat Liver disease Low blood cell levels (white cells, red cells, and platelets) Low blood pressure Parkinson disease Prostate disease Seizures Suicidal thoughts, plans, or attempt by you or a family member Thyroid disease Trouble passing urine An unusual or allergic reaction to quetiapine, other medications, foods, dyes, or preservatives Pregnant or trying to get pregnant Breastfeeding How should I use this medication? Take this medication by mouth with water. Take it as directed on the prescription label at the same time every day. You can take it with or without food. If it upsets your stomach, take it with food. Keep taking it unless your care team tells you to stop. A special MedGuide will be given to you by the pharmacist with each prescription and refill. Be sure to read this information carefully each time. Talk to your care team about the use of this medication in children. While this medication may be prescribed for children as young as 10 years for selected conditions, precautions do apply. People over  11 years of age may have a stronger reaction to this medication and need smaller doses. Overdosage: If you think you have taken too much of this medicine contact a poison control center or emergency room at once. NOTE: This medicine is only for you. Do not share this medicine with others. What if I miss a dose? If you miss a dose, take it as soon as you can. If it is almost time for your next dose, take only that dose. Do not take double or extra doses. What may interact with this medication? Do not take this medication with any of the following: Cisapride Dronedarone Metoclopramide Pimozide Thioridazine This medication may also interact with the following: Alcohol Antihistamines for allergy, cough, and cold Atropine Avasimibe Certain antivirals for HIV or hepatitis Certain medications for anxiety or sleep Certain medications for bladder problems, such as oxybutynin, tolterodine Certain medications for depression, such as amitriptyline, fluoxetine, nefazodone, sertraline Certain medications for fungal infections, such as fluconazole, ketoconazole, itraconazole, posaconazole Certain medications for stomach problems, such as dicyclomine, hyoscyamine Certain medications for travel sickness, such as scopolamine Cimetidine General anesthetics, such as halothane, isoflurane, methoxyflurane, propofol Ipratropium Levodopa or other medications for Parkinson disease Medications for blood pressure Medications for seizures Medications that relax muscles for surgery Opioid medications for pain Other medications that cause heart rhythm changes Phenothiazines, such as chlorpromazine, prochlorperazine Rifampin St. Pratik's wort This list may not describe all possible interactions. Give your health care provider a list of all the medicines, herbs, non-prescription drugs, or dietary supplements you use. Also tell them if you smoke, drink alcohol, or use illegal drugs. Some items may interact with  your medicine. What should I watch for while  using this medication? Visit your care team for regular checks on your progress. Tell your care team if your symptoms do not start to get better or if they get worse. Do not suddenly stop taking This medication. You may develop a severe reaction. Your care team will tell you how much medication to take. If your care team wants you to stop the medication, the dose may be slowly lowered over time to avoid any side effects. You may need to have an eye exam before and during use of this medication. This medication may increase blood sugar. Ask your care team if changes in diet or medications are needed if you have diabetes. This medication may cause thoughts of suicide or depression. This includes sudden changes in mood, behaviors, or thoughts. These changes can happen at any time but are more common in the beginning of treatment or after a change in dose. Call your care team right away if you experience these thoughts or worsening depression. This medication may affect your coordination, reaction time, or judgment. Do not drive or operate machinery until you know how this medication affects you. Sit up or stand slowly to reduce the risk of dizzy or fainting spells. Drinking alcohol with this medication can increase the risk of these side effects. This medication can cause problems with controlling your body temperature. It can lower the response of your body to cold temperatures. If possible, stay indoors during cold weather. If you must go outdoors, wear warm clothes. It can also lower the response of your body to heat. Do not overheat. Do not over-exercise. Stay out of the sun when possible. If you must be in the sun, wear cool clothing. Drink plenty of water. If you have trouble controlling your body temperature, call your care team right away. What side effects may I notice from receiving this medication? Side effects that you should report to your care team as  soon as possible: Allergic reactions--skin rash, itching, hives, swelling of the face, lips, tongue, or throat Heart rhythm changes--fast or irregular heartbeat, dizziness, feeling faint or lightheaded, chest pain, trouble breathing High blood sugar (hyperglycemia)--increased thirst or amount of urine, unusual weakness or fatigue, blurry vision High fever, stiff muscles, increased sweating, fast or irregular heartbeat, and confusion, which may be signs of neuroleptic malignant syndrome High prolactin level--unexpected breast tissue growth, discharge from the nipple, change in sex drive or performance, irregular menstrual cycle Increase in blood pressure in children Infection--fever, chills, cough, or sore throat Low blood pressure--dizziness, feeling faint or lightheaded, blurry vision Low thyroid levels (hypothyroidism)--unusual weakness or fatigue, increased sensitivity to cold, constipation, hair loss, dry skin, weight gain, feelings of depression Pain or trouble swallowing Seizures Stroke--sudden numbness or weakness of the face, arm, or leg, trouble speaking, confusion, trouble walking, loss of balance or coordination, dizziness, severe headache, change in vision Sudden eye pain or change in vision such as blurry vision, seeing halos around lights, vision loss Thoughts of suicide or self-harm, worsening mood, feelings of depression Trouble passing urine Uncontrolled and repetitive body movements, muscle stiffness or spasms, tremors or shaking, loss of balance or coordination, restlessness, shuffling walk, which may be signs of extrapyramidal symptoms (EPS) Side effects that usually do not require medical attention (report to your care team if they continue or are bothersome): Constipation Dizziness Drowsiness Dry mouth Weight gain This list may not describe all possible side effects. Call your doctor for medical advice about side effects. You may report side effects to FDA at  1-800-FDA-1088. Where should I keep my medication? Keep out of the reach of children. Store at room temperature between 15 and 30 degrees C (59 and 86 degrees F). Throw away any unused medication after the expiration date. NOTE: This sheet is a summary. It may not cover all possible information. If you have questions about this medicine, talk to your doctor, pharmacist, or health care provider.  2024 [employer] (2022-04-27 00:00:00)

## 2023-06-08 NOTE — Progress Notes (Unsigned)
Virtual Visit via Video Note  I connected with Stacy Chambers on 06/08/23 at 10:00 AM EDT by a video enabled telemedicine application and verified that I am speaking with the correct person using two identifiers.  Location Provider Location : [facility] Patient Location : Home  Participants: Patient , Provider    I discussed the limitations of evaluation and management by telemedicine and the availability of in person appointments. The patient expressed understanding and agreed to proceed.   I discussed the assessment and treatment plan with the patient. The patient was provided an opportunity to ask questions and all were answered. The patient agreed with the plan and demonstrated an understanding of the instructions.   The patient was advised to call back or seek an in-person evaluation if the symptoms worsen or if the condition fails to improve as anticipated.    BH MD OP Progress Note  06/08/2023 11:20 AM CHAYENNE STRALEY  MRN:  [id number]  Chief Complaint:  Chief Complaint  Patient presents with   Follow-up   Depression   Anxiety   Medication Refill   HPI: Stacy Chambers is a 57 year old Caucasian female, lives in [place], has a history of bipolar disorder, GAD, insomnia, bereavement, RLS, MCI, vitamin B12 deficiency was evaluated by telemedicine today.  Patient today reports she has noticed significant worsening of her mood, reports mood swings, anxiety, racing thoughts, sleep problems, concentration problems.  Patient reports few times a week she has been sleeping only at 3 or 4 AM.  The trazodone does not help her to fall asleep those nights.  Patient reports she spends her time watching TV or working on her diamond puzzles.  Patient also reports a lot of intrusive memories about her son who passed away as well as her husband who passed away.  Patient reports she also has flashbacks during the day.  That has been making her more anxious.  Patient reports she is compliant  with her medications.  Denies side effects.  She denies any suicidality, homicidality or perceptual disturbances.  Currently denies any use of alcohol however does report using cannabis on and off.  Patient reports she continues to have a good relationship with her boyfriend although they do not get to spend a lot of time together due to his work.  He however gets every other weekend off and that helps.  Patient reports she however has been socially withdrawn from her friends and that likely also is due to her worsening mood symptoms.  Patient denies any side effects to medications.  Reports she is compliant with her therapy visits, had recent therapy session with Mr. Suzan Garibaldi.  Patient denies any other concerns today.  Visit Diagnosis:    ICD-10-CM   1. Bipolar 1 disorder, mixed, moderate (HCC)  F31.62 EKG 12-Lead    divalproex (DEPAKOTE ER) 250 MG 24 hr tablet    2. GAD (generalized anxiety disorder)  F41.1     3. Insomnia due to mental disorder  F51.05    Anxiety    4. RLS (restless legs syndrome)  G25.81     5. At risk for prolonged QT interval syndrome  Z91.89 EKG 12-Lead      Past Psychiatric History: I have reviewed past psychiatric history from progress note on 10/12/2018.  Past trials of medications like BuSpar, Zoloft, carbamazepine, Vraylar, Zyprexa, Wellbutrin, Lamictal, Lexapro, gabapentin, risperidone, Geodon.  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   Chronic obstructive pulmonary disease (HCC) 10/03/2015   COPD (chronic obstructive pulmonary disease) (HCC)    no inhalers   Elevated liver function tests 11/26/2004   H/O NEGATIVE  LIVER BIOPSY, workup negative   Gallstones 02/21/2015   GERD (gastroesophageal reflux disease)    Hyperlipidemia    Insomnia    Lower back pain    Osteoarthritis    Ovarian cancer (HCC) 1995   RLS (restless legs syndrome)    Sedative, hypnotic or anxiolytic dependence with withdrawal, 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HCC)    Wears dentures    full upper and lower    Past Surgical History:  Procedure Laterality Date   ABDOMINAL HYSTERECTOMY     APPENDECTOMY     BREAST BIOPSY Left 03/12/2015   fat necrosis   CESAREAN SECTION     COLONOSCOPY WITH PROPOFOL N/A 05/30/2018   Procedure: COLONOSCOPY WITH PROPOFOL;  Surgeon: Scot Jun, MD;  Location: [facility];  Service: Endoscopy;  Laterality: N/A;   EXCISION MORTON'S NEUROMA Left 10/07/2016   Procedure: EXCISION MORTON'S NEUROMA;  Surgeon: Gwyneth Revels, DPM;  Location: [facility];  Service: Podiatry;  Laterality: Left;  IV WITH LOCAL   LIVER BIOPSY  2005   PerPt: Done to eval elevated LFTs and biopsy provided no definite dx/cause of elevated LFTs   MULTIPLE TOOTH EXTRACTIONS     OOPHORECTOMY     OSTECTOMY Right 04/10/2020   Procedure: DOUBLE OSTEOTOMY GENERAL W/ LOCAL;  Surgeon: Gwyneth Revels, DPM;  Location: [facility];  Service: Podiatry;  Laterality: Right;    Family Psychiatric History: I have reviewed family psychiatric history from progress note on 10/12/2018.  Family History:  Family History  Adopted: Yes  Problem Relation Age of Onset   COPD Mother    Depression Mother    Colon cancer Mother        unsure of age   Cancer Father        Lung CA, Skin Cancer--?type   COPD Father    Stroke Father    Cancer Sister 65       cervical cancer   COPD Brother    Alcohol abuse Brother    Stroke Brother    COPD Maternal Aunt    Depression Daughter    Schizophrenia Son    Breast cancer Neg Hx     Social History: I have reviewed social history from progress note on 10/12/2018. Social History   Socioeconomic  History   Marital status: Widowed    Spouse name: Not on file   Number of children: Not on file   Years of education: Not on file   Highest education level: 10th grade  Occupational History   Not on file  Tobacco Use   Smoking status: Former    Current packs/day: 0.00    Average packs/day: 1 pack/day for 30.0 years (30.0 ttl pk-yrs)    Types: Cigarettes    Start date: 04/09/1979    Quit date: 04/08/2009    Years since quitting: 14.1   Smokeless tobacco: Never  Vaping Use   Vaping status: Never Used  Substance and Sexual Activity   Alcohol use: Not Currently    Alcohol/week:  3.0 - 7.0 standard drinks of alcohol    Types: 3 - 7 Shots of liquor per week    Comment: occasionally   Drug use: Not Currently    Types: Marijuana    Comment: told to refrain until after surgery   Sexual activity: Yes    Birth control/protection: Surgical  Other Topics Concern   Not on file  Social History Narrative   Not on file   Social Determinants of Health   Financial Resource Strain: Medium Risk (02/02/2023)   Overall Financial Resource Strain (CARDIA)    Difficulty of Paying Living Expenses: Somewhat hard  Food Insecurity: Food Insecurity Present (02/02/2023)   Hunger Vital Sign    Worried About Running Out of Food in the Last Year: Sometimes true    Ran Out of Food in the Last Year: Sometimes true  Transportation Needs: No Transportation Needs (02/02/2023)   PRAPARE - Administrator, Civil Service (Medical): No    Lack of Transportation (Non-Medical): No  Physical Activity: Sufficiently Active (02/02/2023)   Exercise Vital Sign    Days of Exercise per Week: 3 days    Minutes of Exercise per Session: 100 min  Stress: Stress Concern Present (02/02/2023)   [employer] of Occupational Health - Occupational Stress Questionnaire    Feeling of Stress : To some extent  Social Connections: Unknown (02/02/2023)   Social Connection and Isolation Panel [NHANES]    Frequency of Communication  with Friends and Family: Patient declined    Frequency of Social Gatherings with Friends and Family: Patient declined    Attends Religious Services: Never    Database administrator or Organizations: No    Attends Engineer, structural: Not on file    Marital Status: Widowed    Allergies:  Allergies  Allergen Reactions   Benadryl [Diphenhydramine Hcl]     Hives/ rapid heartrate   Codeine     dizziness   Vicodin [Hydrocodone-Acetaminophen] Other (See Comments)    dizziness    Metabolic Disorder Labs: Lab Results  Component Value Date   HGBA1C 5.4 10/30/2022   MPG 114 09/14/2018   No results found for: "PROLACTIN" Lab Results  Component Value Date   CHOL 172 10/30/2022   TRIG 205 (H) 10/30/2022   HDL 55 10/30/2022   CHOLHDL 3.1 10/30/2022   VLDL 18 03/04/2015   LDLCALC 83 10/30/2022   LDLCALC 56 03/30/2022   Lab Results  Component Value Date   TSH 1.700 10/30/2022   TSH 2.600 03/30/2022    Therapeutic Level Labs: Lab Results  Component Value Date   LITHIUM 0.3 (L) 10/24/2018   LITHIUM 0.49 (L) 01/13/2009   Lab Results  Component Value Date   VALPROATE 70 11/11/2022   VALPROATE 29 (L) 06/02/2022   No results found for: "CBMZ"  Current Medications: Current Outpatient Medications  Medication Sig Dispense Refill   divalproex (DEPAKOTE ER) 250 MG 24 hr tablet Take 1 tablet (250 mg total) by mouth daily. Take along with 1000 mg daily , total of 1250 mg     divalproex (DEPAKOTE ER) 500 MG 24 hr tablet Take 2 tablets (1,000 mg total) by mouth daily. Take along with 250 mg daily - total of 1250 mg 180 tablet 0   escitalopram (LEXAPRO) 20 MG tablet Take 20 mg by mouth daily.     esomeprazole (NEXIUM) 20 MG capsule Take 1 capsule (20 mg total) by mouth daily as needed (take on empty stomach). 90 capsule 1  fluticasone (FLONASE) 50 MCG/ACT nasal spray Place 2 sprays into both nostrils daily. 16 g 2   loratadine (CLARITIN) 10 MG tablet Take 10 mg by mouth  daily.     meloxicam (MOBIC) 15 MG tablet Take 1 tablet (15 mg total) by mouth daily. 90 tablet 1   methocarbamol (ROBAXIN) 500 MG tablet TAKE 1 TABLET EVERY DAY 60 tablet 3   pantoprazole (PROTONIX) 20 MG tablet Take 1 tablet (20 mg total) by mouth daily. 30 tablet 1   rosuvastatin (CRESTOR) 10 MG tablet Take 1 tablet (10 mg total) by mouth daily. 90 tablet 1   traZODone (DESYREL) 100 MG tablet Take 1-2 tablets (100-200 mg total) by mouth at bedtime as needed for sleep. 180 tablet 1   Vitamin D, Ergocalciferol, (DRISDOL) 1.25 MG (50000 UNIT) CAPS capsule Take 1 capsule (50,000 Units total) by mouth every 7 (seven) days. 20 capsule 3   clonazePAM (KLONOPIN) 0.5 MG tablet Take 1 tablet (0.5 mg total) by mouth 2 to 3  times a week as directed for severe anxiety attacks. 6 tablet 1   No current facility-administered medications for this visit.     Musculoskeletal: Strength & Muscle Tone:  UTA Gait & Station:  Seated Patient leans: N/A  Psychiatric Specialty Exam: Review of Systems  Psychiatric/Behavioral:  Positive for decreased concentration, dysphoric mood and sleep disturbance. The patient is nervous/anxious.     There were no vitals taken for this visit.There is no height or weight on file to calculate BMI.  General Appearance: Fairly Groomed  Eye Contact:  Fair  Speech:  Clear and Coherent  Volume:  Normal  Mood:  Anxious and Dysphoric  Affect:  Congruent  Thought Process:  Goal Directed and Descriptions of Associations: Intact  Orientation:  Full (Time, Place, and Person)  Thought Content: Rumination   Suicidal Thoughts:  No  Homicidal Thoughts:  No  Memory:  Immediate;   Fair Recent;   Fair Remote;   Fair  Judgement:  Fair  Insight:  Fair  Psychomotor Activity:  Normal  Concentration:  Concentration: Fair and Attention Span: Fair  Recall:  [employer] of Knowledge: Fair  Language: Fair  Akathisia:  No  Handed:  Left  AIMS (if indicated): not done  Assets:   Communication Skills Desire for Improvement Housing Social Support  ADL's:  Intact  Cognition: WNL  Sleep:  Poor   Screenings: AIMS    Flowsheet Row Video Visit from 04/06/2023 in [facility] Office Visit from 11/23/2022 in [facility] Video Visit from 06/01/2022 in [facility] Video Visit from 03/24/2022 in [facility] Video Visit from 06/04/2021 in [facility]  AIMS Total Score 0 0 0 0 0      GAD-7    Flowsheet Row Office Visit from 05/11/2023 in [facility] Office Visit from 04/05/2023 in [facility] Office Visit from 03/05/2023 in [facility] Video Visit from 02/15/2023 in [facility] Office Visit from 02/05/2023 in [facility]  Total GAD-7 Score 0 7 0 0 15      PHQ2-9    Flowsheet Row Office Visit from 05/11/2023 in [facility] Office Visit from 04/05/2023 in [facility] Office Visit from 03/05/2023 in [facility] Video Visit from 02/15/2023 in [facility] Office Visit from 02/05/2023 in [facility]  PHQ-2 Total Score 0 0 0 0 0  PHQ-9 Total Score 0 7 0 0 6      Flowsheet Row Video Visit from 06/08/2023 in [facility] Video Visit from 04/06/2023 in [facility] Office Visit from 02/17/2023 in [facility]  C-SSRS RISK CATEGORY No Risk No Risk No Risk        Assessment and Plan: JAYLIN KUZMINSKI is a 56 year old Caucasian female, widowed, lives in [place], has a history of bipolar disorder, anxiety disorder, COPD, RLS, vitamin B12  deficiency was evaluated by telemedicine today.  Patient is currently having worsening mood symptoms, as well as trauma related symptoms including flashbacks nightmares and intrusive memories, she will benefit from medication changes as well as more frequent psychotherapy sessions.  Plan as noted below.  Plan Bipolar disorder type I moderate mixed-unstable Continue Depakote ER 1250 milligram p.o. daily Depakote level-11/11/2022-70-therapeutic Continue trazodone 100-200 mg p.o. nightly as needed for now. However will consider starting Seroquel low dosage of 25 mg p.o. nightly.  Will need an EKG prior to that.  Patient provided medication education including risks, side effects, drug to drug interaction with medications including trazodone.  Once she starts Seroquel she could lower the dosage of trazodone to 50 to 100 mg at bedtime as needed or could skip it altogether. Discontinue hydroxyzine for lack of benefit and noncompliance.   GAD-unstable Will consider starting Seroquel as noted above. Lexapro 20 mg p.o. daily Discontinue Klonopin, patient with comorbid cannabis use, will not prescribe benzodiazepines.  This was discussed with patient.  Patient advised to stop using cannabis. Continue CBT with Mr. Suzan Garibaldi more frequently.  Will coordinate care.  RLS-stable Will monitor closely  At risk for prolonged QT syndrome-we will order EKG.  Patient to call [phone].  We will consider getting a urine drug screen in the future in a patient with mood symptoms, sleep problems as well as comorbid episodic use of cannabis.  Follow-up in clinic in 2 to 3 weeks or sooner if needed.  Collaboration of Care: Collaboration of Care: Referral or follow-up with counselor/therapist AEB patient encouraged to continue CBT.  Patient/Guardian was advised Release of Information must be obtained prior to any record release in order to collaborate their care with an outside provider. Patient/Guardian was  advised if they have not already done so to contact the registration department to sign all necessary forms in order for [name] to release information regarding their care.   Consent: Patient/Guardian gives verbal consent for treatment and assignment of benefits for services provided during this visit. Patient/Guardian expressed understanding and agreed to proceed.   This note was generated in part or whole with voice recognition software. Voice recognition is usually quite accurate but there are transcription errors that can and very often do occur. I apologize for any typographical errors that were not detected and corrected.    Jomarie Longs, MD 06/09/2023, 8:19 AM

## 2023-06-09 ENCOUNTER — Other Ambulatory Visit (HOSPITAL_COMMUNITY): Payer: Self-pay

## 2023-06-10 ENCOUNTER — Encounter: Payer: Self-pay | Admitting: Family Medicine

## 2023-06-10 ENCOUNTER — Ambulatory Visit (INDEPENDENT_AMBULATORY_CARE_PROVIDER_SITE_OTHER): Payer: HMO | Admitting: Family Medicine

## 2023-06-10 ENCOUNTER — Other Ambulatory Visit: Payer: Self-pay | Admitting: Psychiatry

## 2023-06-10 ENCOUNTER — Other Ambulatory Visit: Payer: Self-pay

## 2023-06-10 VITALS — BP 102/69 | HR 73 | Ht 64.0 in | Wt 165.0 lb

## 2023-06-10 DIAGNOSIS — L219 Seborrheic dermatitis, unspecified: Secondary | ICD-10-CM | POA: Diagnosis not present

## 2023-06-10 DIAGNOSIS — E669 Obesity, unspecified: Secondary | ICD-10-CM

## 2023-06-10 DIAGNOSIS — F3162 Bipolar disorder, current episode mixed, moderate: Secondary | ICD-10-CM

## 2023-06-10 MED ORDER — DIVALPROEX SODIUM ER 250 MG PO TB24
250.0000 mg | ORAL_TABLET | Freq: Every day | ORAL | 1 refills | Status: DC
  Filled 2023-06-10 – 2023-07-28 (×2): qty 90, 90d supply, fill #0
  Filled 2023-09-07 – 2023-10-14 (×2): qty 90, 90d supply, fill #1

## 2023-06-10 MED ORDER — KETOCONAZOLE 2 % EX SHAM
1.0000 | MEDICATED_SHAMPOO | CUTANEOUS | 0 refills | Status: DC
  Filled 2023-06-10: qty 120, 30d supply, fill #0

## 2023-06-10 NOTE — Patient Instructions (Addendum)
I appreciate the opportunity to provide care to you today!    Follow up:  3 months  Please schedule medicare annual wellness  For the itchy scalp, please start using Ketoconazole 2% shampoo twice a week to help alleviate the symptoms.  Nonpharmacological interventions for an itchy scalp   Scalp Hygiene Regular Washing: Wash your scalp with a mild, non-irritating shampoo to remove excess oils, dirt, and dead skin cells. Avoid harsh shampoos or those with strong fragrances. Avoid Over-Washing: Do not wash your scalp too frequently, as this can strip natural oils and exacerbate dryness.  Moisturization Scalp Oil: Apply natural oils such as coconut oil, olive oil, or argan oil to the scalp to moisturize and soothe dryness. Massage gently and leave the oil on for a short period before washing it out. Aloe Vera: Use aloe vera gel to soothe the scalp. It has anti-inflammatory properties and can provide relief from itching and irritation. Avoid Irritants Hair Products: Avoid hair products containing harsh chemicals, alcohol, or artificial fragrances that can irritate the scalp. Opt for products labeled as hypoallergenic or for sensitive skin. Allergens: Identify and avoid potential allergens or irritants in hair care products or environmental factors. Maintain Scalp Health Exfoliation: Gently exfoliate the scalp to remove dead skin cells and prevent buildup. You can use a soft brush or a gentle exfoliating treatment designed for the scalp. Balanced Diet: Eat a balanced diet rich in vitamins and minerals that support skin health, such as omega-3 fatty acids, vitamin E, and zinc. Stress Management Reduce Stress: Practice stress-reducing techniques such as yoga, meditation, or deep breathing exercises, as stress can exacerbate scalp itching and irritation. Avoid Heat and Chemicals Limit Heat Styling: Reduce the use of heat styling tools like hair dryers and straighteners, which can dry out the  scalp. Chemical Treatments: Avoid frequent use of chemical treatments such as dyes, relaxers, or perms, which can irritate the scalp. Hydration Stay Hydrated: Drink plenty of water throughout the day to maintain skin hydration, including the scalp. Natural Remedies Tea Tree Oil: Use diluted tea tree oil (mixed with a carrier oil) for its antifungal and antimicrobial properties. Apply a small amount to the scalp and leave it on for a short period before rinsing. Apple Cider Vinegar: Rinse the scalp with a mixture of apple cider vinegar and water to help balance the scalp's pH and reduce itching. Dilute the vinegar to avoid irritation.                                                      Keep up the excellent work with your weight loss efforts.  Here are some tips to assist with weight loss:  -Reduce Portion Sizes: Be mindful of portion sizes to manage calorie intake effectively. I-ncrease Intake of Nutrient-Rich Foods: Incorporate more fruits, vegetables, and whole grains into your diet. -Focus on Lean Proteins: Opt for lean proteins such as chicken, fish, beans, and legumes. -Choose Low-Fat Dairy Products: Select dairy products that are lower in fat. -Limit Unhealthy Fats: Reduce consumption of saturated fats, trans fatty acids, and cholesterol. -Be Physically Active: Aim for at least 30 minutes of moderate activity, such as brisk walking, on at least 5 days a week.    It was a pleasure to see you and I look forward to continuing to work together on your health and  well-being. Please do not hesitate to call the office if you need care or have questions about your care.  In case of emergency, please visit the Emergency Department for urgent care, or contact our clinic at [phone] to schedule an appointment. We're here to help you!   Have a wonderful day and week. With Gratitude, Gilmore Laroche MSN, FNP-BC

## 2023-06-10 NOTE — Telephone Encounter (Signed)
I have send Depakote ER 250 mg p.o. daily to pharmacy as requested.

## 2023-06-10 NOTE — Progress Notes (Signed)
Established Patient Office Visit  Subjective:  Patient ID: Stacy Chambers, female    DOB: 08/01/1966  Age: 57 y.o. MRN: [id number]  CC:  Chief Complaint  Patient presents with   Obesity    1 month f/u   Hair/Scalp Problem    Pt report itchy scalp ongoing for a while.     HPI Stacy Chambers is a 57 y.o. female  presents for 1 month  f/u of weight. For the details of today's visit, please refer to the assessment and plan.The patient has loss 11 pounds since 04/05/23 with lifestyle modification.     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   Chronic obstructive pulmonary disease (HCC) 10/03/2015   COPD (chronic obstructive pulmonary disease) (HCC)    no inhalers   Elevated liver function tests 11/26/2004   H/O NEGATIVE LIVER BIOPSY, workup negative   Gallstones 02/21/2015   GERD (gastroesophageal reflux disease)    Hyperlipidemia    Insomnia    Lower back pain    Osteoarthritis    Ovarian cancer (HCC) 1995   RLS (restless legs syndrome)    Sedative, hypnotic or anxiolytic dependence with withdrawal, 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HCC)    Wears dentures    full upper and lower    Past Surgical History:  Procedure Laterality Date   ABDOMINAL HYSTERECTOMY     APPENDECTOMY     BREAST BIOPSY Left 03/12/2015   fat necrosis   CESAREAN SECTION     COLONOSCOPY WITH PROPOFOL N/A 05/30/2018   Procedure: COLONOSCOPY WITH PROPOFOL;  Surgeon: Scot Jun, MD;  Location: [facility];  Service: Endoscopy;  Laterality: N/A;   EXCISION MORTON'S NEUROMA Left 10/07/2016   Procedure: EXCISION MORTON'S NEUROMA;  Surgeon: Gwyneth Revels, DPM;  Location: [facility];  Service: Podiatry;   Laterality: Left;  IV WITH LOCAL   LIVER BIOPSY  2005   PerPt: Done to eval elevated LFTs and biopsy provided no definite dx/cause of elevated LFTs   MULTIPLE TOOTH EXTRACTIONS     OOPHORECTOMY     OSTECTOMY Right 04/10/2020   Procedure: DOUBLE OSTEOTOMY GENERAL W/ LOCAL;  Surgeon: Gwyneth Revels, DPM;  Location: [facility];  Service: Podiatry;  Laterality: Right;    Family History  Adopted: Yes  Problem Relation Age of Onset   COPD Mother    Depression Mother    Colon cancer Mother        unsure of age   Cancer Father        Lung CA, Skin Cancer--?type   COPD Father    Stroke Father    Cancer Sister 7       cervical cancer   COPD Brother    Alcohol abuse Brother    Stroke Brother    COPD Maternal Aunt    Depression Daughter    Schizophrenia Son    Breast cancer Neg Hx     Social History   Socioeconomic History   Marital status: Widowed    Spouse name: Not on file   Number of children: Not on file   Years of education: Not on file   Highest  education level: 10th grade  Occupational History   Not on file  Tobacco Use   Smoking status: Former    Current packs/day: 0.00    Average packs/day: 1 pack/day for 30.0 years (30.0 ttl pk-yrs)    Types: Cigarettes    Start date: 04/09/1979    Quit date: 04/08/2009    Years since quitting: 14.1   Smokeless tobacco: Never  Vaping Use   Vaping status: Never Used  Substance and Sexual Activity   Alcohol use: Not Currently    Alcohol/week: 3.0 - 7.0 standard drinks of alcohol    Types: 3 - 7 Shots of liquor per week    Comment: occasionally   Drug use: Not Currently    Types: Marijuana    Comment: told to refrain until after surgery   Sexual activity: Yes    Birth control/protection: Surgical  Other Topics Concern   Not on file  Social History Narrative   Not on file   Social Determinants of Health   Financial Resource Strain: Medium Risk (02/02/2023)   Overall Financial Resource Strain (CARDIA)     Difficulty of Paying Living Expenses: Somewhat hard  Food Insecurity: Food Insecurity Present (02/02/2023)   Hunger Vital Sign    Worried About Running Out of Food in the Last Year: Sometimes true    Ran Out of Food in the Last Year: Sometimes true  Transportation Needs: No Transportation Needs (02/02/2023)   PRAPARE - Administrator, Civil Service (Medical): No    Lack of Transportation (Non-Medical): No  Physical Activity: Sufficiently Active (02/02/2023)   Exercise Vital Sign    Days of Exercise per Week: 3 days    Minutes of Exercise per Session: 100 min  Stress: Stress Concern Present (02/02/2023)   [employer] of Occupational Health - Occupational Stress Questionnaire    Feeling of Stress : To some extent  Social Connections: Unknown (02/02/2023)   Social Connection and Isolation Panel [NHANES]    Frequency of Communication with Friends and Family: Patient declined    Frequency of Social Gatherings with Friends and Family: Patient declined    Attends Religious Services: Never    Database administrator or Organizations: No    Attends Engineer, structural: Not on file    Marital Status: Widowed  Intimate Partner Violence: Not on file    Outpatient Medications Prior to Visit  Medication Sig Dispense Refill   clonazePAM (KLONOPIN) 0.5 MG tablet Take 1 tablet (0.5 mg total) by mouth 2 to 3  times a week as directed for severe anxiety attacks. 6 tablet 1   divalproex (DEPAKOTE ER) 500 MG 24 hr tablet Take 2 tablets (1,000 mg total) by mouth daily. Take along with 250 mg daily - total of 1250 mg 180 tablet 0   escitalopram (LEXAPRO) 20 MG tablet Take 20 mg by mouth daily.     esomeprazole (NEXIUM) 20 MG capsule Take 1 capsule (20 mg total) by mouth daily as needed (take on empty stomach). 90 capsule 1   fluticasone (FLONASE) 50 MCG/ACT nasal spray Place 2 sprays into both nostrils daily. 16 g 2   meloxicam (MOBIC) 15 MG tablet Take 1 tablet (15 mg total) by mouth  daily. 90 tablet 1   methocarbamol (ROBAXIN) 500 MG tablet TAKE 1 TABLET EVERY DAY 60 tablet 3   pantoprazole (PROTONIX) 20 MG tablet Take 1 tablet (20 mg total) by mouth daily. 30 tablet 1   rosuvastatin (CRESTOR) 10 MG tablet Take  1 tablet (10 mg total) by mouth daily. 90 tablet 1   traZODone (DESYREL) 100 MG tablet Take 1-2 tablets (100-200 mg total) by mouth at bedtime as needed for sleep. 180 tablet 1   Vitamin D, Ergocalciferol, (DRISDOL) 1.25 MG (50000 UNIT) CAPS capsule Take 1 capsule (50,000 Units total) by mouth every 7 (seven) days. 20 capsule 3   divalproex (DEPAKOTE ER) 250 MG 24 hr tablet Take 1 tablet (250 mg total) by mouth daily. Take along with 1000 mg daily , total of 1250 mg     loratadine (CLARITIN) 10 MG tablet Take 10 mg by mouth daily.     No facility-administered medications prior to visit.    Allergies  Allergen Reactions   Benadryl [Diphenhydramine Hcl]     Hives/ rapid heartrate   Codeine     dizziness   Vicodin [Hydrocodone-Acetaminophen] Other (See Comments)    dizziness    ROS Review of Systems  Constitutional:  Negative for chills and fever.  Eyes:  Negative for visual disturbance.  Respiratory:  Negative for chest tightness and shortness of breath.   Skin:        Itchy scalp  Neurological:  Negative for dizziness and headaches.      Objective:    Physical Exam HENT:     Head: Normocephalic.     Mouth/Throat:     Mouth: Mucous membranes are moist.  Cardiovascular:     Rate and Rhythm: Normal rate.     Heart sounds: Normal heart sounds.  Pulmonary:     Effort: Pulmonary effort is normal.     Breath sounds: Normal breath sounds.  Skin:    Findings: No erythema, lesion or rash.  Neurological:     Mental Status: She is alert.     BP 102/69   Pulse 73   Ht 5\' 4"  (1.626 m)   Wt 165 lb (74.8 kg)   SpO2 94%   BMI 28.32 kg/m  Wt Readings from Last 3 Encounters:  06/10/23 165 lb (74.8 kg)  05/11/23 167 lb 1.9 oz (75.8 kg)  04/05/23  176 lb 1.9 oz (79.9 kg)    Lab Results  Component Value Date   TSH 1.700 10/30/2022   Lab Results  Component Value Date   WBC 7.0 10/30/2022   HGB 12.2 10/30/2022   HCT 35.5 10/30/2022   MCV 90 10/30/2022   PLT 181 10/30/2022   Lab Results  Component Value Date   NA 138 10/30/2022   K 4.0 10/30/2022   CO2 25 10/30/2022   GLUCOSE 93 10/30/2022   BUN 16 10/30/2022   CREATININE 0.67 10/30/2022   BILITOT 0.3 10/30/2022   ALKPHOS 77 10/30/2022   AST 18 10/30/2022   ALT 16 10/30/2022   PROT 7.4 10/30/2022   ALBUMIN 4.9 10/30/2022   CALCIUM 10.2 10/30/2022   EGFR 102 10/30/2022   Lab Results  Component Value Date   CHOL 172 10/30/2022   Lab Results  Component Value Date   HDL 55 10/30/2022   Lab Results  Component Value Date   LDLCALC 83 10/30/2022   Lab Results  Component Value Date   TRIG 205 (H) 10/30/2022   Lab Results  Component Value Date   CHOLHDL 3.1 10/30/2022   Lab Results  Component Value Date   HGBA1C 5.4 10/30/2022      Assessment & Plan:  Seborrheic dermatitis of scalp Assessment & Plan: Reviewed nonpharmacological intervention including scalp hygiene, moisturization, avoiding irritants and maintain scalp health Will treat  today with ketoconazole 2% shampoo twice a week to help alleviate the symptoms  Orders: -     Ketoconazole; Apply 1 Application topically 2 (two) times a week.  Dispense: 120 mL; Refill: 0  Obesity (BMI 30-39.9) Assessment & Plan: Encouraged to continue a heart healthy diet with increased physical activity Wt Readings from Last 3 Encounters:  06/10/23 165 lb (74.8 kg)  05/11/23 167 lb 1.9 oz (75.8 kg)  04/05/23 176 lb 1.9 oz (79.9 kg)      Note: This chart has been completed using Engineer, civil (consulting) software, and while attempts have been made to ensure accuracy, certain words and phrases may not be transcribed as intended.    Follow-up: Return in about 3 months (around 09/10/2023).   Gilmore Laroche,  FNP

## 2023-06-11 ENCOUNTER — Other Ambulatory Visit (HOSPITAL_COMMUNITY): Payer: Self-pay

## 2023-06-11 ENCOUNTER — Ambulatory Visit
Admission: RE | Admit: 2023-06-11 | Discharge: 2023-06-11 | Disposition: A | Discharge status: Home / Self Care | Payer: HMO | Source: Ambulatory Visit | Attending: Psychiatry | Admitting: Psychiatry

## 2023-06-11 DIAGNOSIS — F3162 Bipolar disorder, current episode mixed, moderate: Secondary | ICD-10-CM | POA: Insufficient documentation

## 2023-06-11 DIAGNOSIS — Z9189 Other specified personal risk factors, not elsewhere classified: Secondary | ICD-10-CM | POA: Insufficient documentation

## 2023-06-13 DIAGNOSIS — L219 Seborrheic dermatitis, unspecified: Secondary | ICD-10-CM | POA: Insufficient documentation

## 2023-06-13 NOTE — Assessment & Plan Note (Signed)
Encouraged to continue a heart healthy diet with increased physical activity Wt Readings from Last 3 Encounters:  06/10/23 165 lb (74.8 kg)  05/11/23 167 lb 1.9 oz (75.8 kg)  04/05/23 176 lb 1.9 oz (79.9 kg)

## 2023-06-13 NOTE — Assessment & Plan Note (Signed)
Reviewed nonpharmacological intervention including scalp hygiene, moisturization, avoiding irritants and maintain scalp health Will treat today with ketoconazole 2% shampoo twice a week to help alleviate the symptoms

## 2023-06-13 NOTE — Assessment & Plan Note (Deleted)
Reviewed nonpharmacological intervention including scalp hygiene, moisturization, avoiding irritants and maintain scalp health Will treat today with ketoconazole 2% shampoo twice a week to help alleviate the symptoms

## 2023-06-16 ENCOUNTER — Telehealth: Payer: Self-pay | Admitting: Psychiatry

## 2023-06-16 DIAGNOSIS — F3162 Bipolar disorder, current episode mixed, moderate: Secondary | ICD-10-CM

## 2023-06-16 NOTE — Telephone Encounter (Signed)
Contacted patient to discuss adding Seroquel since EKG came back as normal sinus rhythm.  Patient reports she does not want to start the Seroquel or any other psychotropic medications which can contribute to weight gain.  Patient reports she feels better with regards to her mood and would like to just pursue psychotherapy and will reach out to this provider if she needs more medications.

## 2023-06-27 ENCOUNTER — Other Ambulatory Visit: Payer: Self-pay | Admitting: Family Medicine

## 2023-06-27 ENCOUNTER — Other Ambulatory Visit (HOSPITAL_COMMUNITY): Payer: Self-pay

## 2023-06-27 DIAGNOSIS — L219 Seborrheic dermatitis, unspecified: Secondary | ICD-10-CM

## 2023-06-29 ENCOUNTER — Other Ambulatory Visit: Payer: Self-pay

## 2023-06-29 ENCOUNTER — Ambulatory Visit (INDEPENDENT_AMBULATORY_CARE_PROVIDER_SITE_OTHER): Payer: HMO

## 2023-06-29 ENCOUNTER — Telehealth: Payer: Self-pay | Admitting: *Deleted

## 2023-06-29 ENCOUNTER — Other Ambulatory Visit (HOSPITAL_COMMUNITY): Payer: Self-pay

## 2023-06-29 VITALS — Ht 64.0 in | Wt 169.0 lb

## 2023-06-29 DIAGNOSIS — Z789 Other specified health status: Secondary | ICD-10-CM

## 2023-06-29 DIAGNOSIS — Z0000 Encounter for general adult medical examination without abnormal findings: Secondary | ICD-10-CM

## 2023-06-29 DIAGNOSIS — Z5941 Food insecurity: Secondary | ICD-10-CM

## 2023-06-29 MED ORDER — ESCITALOPRAM OXALATE 20 MG PO TABS
20.0000 mg | ORAL_TABLET | Freq: Every day | ORAL | 0 refills | Status: DC
  Filled 2023-06-29: qty 30, 30d supply, fill #0

## 2023-06-29 NOTE — Progress Notes (Signed)
  Care Coordination   Note   06/29/2023 Name: EMMAMARIE KNEIPP MRN: [id number] DOB: 09/11/1966  DELAINA HOVORKA is a 57 y.o. year old female who sees Gilmore Laroche, FNP for primary care. I reached out to Tamala Ser by phone today to offer care coordination services.  Ms. Charania was given information about Care Coordination services today including:   The Care Coordination services include support from the care team which includes your Nurse Coordinator, Clinical Social Worker, or Pharmacist.  The Care Coordination team is here to help remove barriers to the health concerns and goals most important to you. Care Coordination services are voluntary, and the patient may decline or stop services at any time by request to their care team member.   Care Coordination Consent Status: Patient agreed to services and verbal consent obtained.   Follow up plan:  Telephone appointment with care coordination team member scheduled for:  07/07/23  Encounter Outcome:  Pt. Scheduled  [facility] Coordination Care Guide  Direct Dial: [phone]

## 2023-06-29 NOTE — Patient Instructions (Signed)
Stacy Chambers , Thank you for taking time to come for your Medicare Wellness Visit. I appreciate your ongoing commitment to your health goals. Please review the following plan we discussed and let me know if I can assist you in the future.   Referrals/Orders/Follow-Ups/Clinician Recommendations:  A referral has been placed to [facility] for you to see what additional resources are available to you. They should call you within the next 30 days.  You have been referred to [facility].If you haven't heard from them within the next week, please call them to schedule your appointment.    Address: [address], [place], [place] [address] Phone: [phone]  You have an order for:  []   2D Mammogram  []   3D Mammogram  []   Bone Density   [x]   Lung Cancer Screening  Please call for appointment:   [facility] at [facility] [address] [place], [place] [address] [phone]  Make sure to wear two-piece clothing.  No lotions powders or deodorants the day of the appointment Make sure to bring picture ID and insurance card.  Bring list of medications you are currently taking including any supplements.   Schedule your [place] screening mammogram through MyChart!   Log into your MyChart account.  Go to 'Visit' (or 'Appointments' if on mobile App) --> Schedule an Appointment  Under 'Select a Reason for Visit' choose the Mammogram Screening option.  Complete the pre-visit questions and select the time and place that best fits your schedule.    This is a list of the screening recommended for you and due dates:  Health Maintenance  Topic Date Due   COVID-19 Vaccine (1) Never done   Zoster (Shingles) Vaccine (1 of 2) Never done   Flu Shot  05/27/2023   Colon Cancer Screening  05/31/2023   DTaP/Tdap/Td vaccine (2 - Td or Tdap) 11/23/2023   Screening for Lung Cancer  02/25/2024   Mammogram  05/25/2024   Medicare Annual Wellness Visit  06/28/2024    Hepatitis C Screening  Completed   HIV Screening  Completed   HPV Vaccine  Aged Out    Advanced directives: (Declined) Advance directive discussed with you today. Even though you declined this today, please call our office should you change your mind, and we can give you the proper paperwork for you to fill out.  Next Medicare Annual Wellness Visit scheduled for next year: Yes  Preventive Care 63-39 Years Old, Female Preventive care refers to lifestyle choices and visits with your health care provider that can promote health and wellness. Preventive care visits are also called wellness exams. What can I expect for my preventive care visit? Counseling Your health care provider may ask you questions about your: Medical history, including: Past medical problems. Family medical history. Pregnancy history. Current health, including: Menstrual cycle. Method of birth control. Emotional well-being. Home life and relationship well-being. Sexual activity and sexual health. Lifestyle, including: Alcohol, nicotine or tobacco, and drug use. Access to firearms. Diet, exercise, and sleep habits. Work and work Astronomer. Sunscreen use. Safety issues such as seatbelt and bike helmet use. Physical exam Your health care provider will check your: Height and weight. These may be used to calculate your BMI (body mass index). BMI is a measurement that tells if you are at a healthy weight. Waist circumference. This measures the distance around your waistline. This measurement also tells if you are at a healthy weight and may help predict your risk of certain diseases,  such as type 2 diabetes and high blood pressure. Heart rate and blood pressure. Body temperature. Skin for abnormal spots. What immunizations do I need?  Vaccines are usually given at various ages, according to a schedule. Your health care provider will recommend vaccines for you based on your age, medical history, and lifestyle or  other factors, such as travel or where you work. What tests do I need? Screening Your health care provider may recommend screening tests for certain conditions. This may include: Lipid and cholesterol levels. Diabetes screening. This is done by checking your blood sugar (glucose) after you have not eaten for a while (fasting). Pelvic exam and Pap test. Hepatitis B test. Hepatitis C test. HIV (human immunodeficiency virus) test. STI (sexually transmitted infection) testing, if you are at risk. Lung cancer screening. Colorectal cancer screening. Mammogram. Talk with your health care provider about when you should start having regular mammograms. This may depend on whether you have a family history of breast cancer. BRCA-related cancer screening. This may be done if you have a family history of breast, ovarian, tubal, or peritoneal cancers. Bone density scan. This is done to screen for osteoporosis. Talk with your health care provider about your test results, treatment options, and if necessary, the need for more tests. Follow these instructions at home: Eating and drinking  Eat a diet that includes fresh fruits and vegetables, whole grains, lean protein, and low-fat dairy products. Take vitamin and mineral supplements as recommended by your health care provider. Do not drink alcohol if: Your health care provider tells you not to drink. You are pregnant, may be pregnant, or are planning to become pregnant. If you drink alcohol: Limit how much you have to 0-1 drink a day. Know how much alcohol is in your drink. In the [place] one drink equals one 12 oz bottle of beer (355 mL), one 5 oz glass of wine (148 mL), or one 1 oz glass of hard liquor (44 mL). Lifestyle Brush your teeth every morning and night with fluoride toothpaste. Floss one time each day. Exercise for at least 30 minutes 5 or more days each week. Do not use any products that contain nicotine or tobacco. These products include  cigarettes, chewing tobacco, and vaping devices, such as e-cigarettes. If you need help quitting, ask your health care provider. Do not use drugs. If you are sexually active, practice safe sex. Use a condom or other form of protection to prevent STIs. If you do not wish to become pregnant, use a form of birth control. If you plan to become pregnant, see your health care provider for a prepregnancy visit. Take aspirin only as told by your health care provider. Make sure that you understand how much to take and what form to take. Work with your health care provider to find out whether it is safe and beneficial for you to take aspirin daily. Find healthy ways to manage stress, such as: Meditation, yoga, or listening to music. Journaling. Talking to a trusted person. Spending time with friends and family. Minimize exposure to UV radiation to reduce your risk of skin cancer. Safety Always wear your seat belt while driving or riding in a vehicle. Do not drive: If you have been drinking alcohol. Do not ride with someone who has been drinking. When you are tired or distracted. While texting. If you have been using any mind-altering substances or drugs. Wear a helmet and other protective equipment during sports activities. If you have firearms in your house, make  sure you follow all gun safety procedures. Seek help if you have been physically or sexually abused. What's next? Visit your health care provider once a year for an annual wellness visit. Ask your health care provider how often you should have your eyes and teeth checked. Stay up to date on all vaccines. This information is not intended to replace advice given to you by your health care provider. Make sure you discuss any questions you have with your health care provider. Document Revised: 04/09/2021 Document Reviewed: 04/09/2021 Elsevier Patient Education  2024 [employer].

## 2023-06-29 NOTE — Progress Notes (Signed)
 Because this visit was a virtual/telehealth visit,  certain criteria was not obtained, such a blood pressure, CBG if patient is a diabetic, and timed get up and go. Any medications not marked as "taking" was not mentioned during the medication reconciliation part of the visit. Any vitals not documented were not able to be obtained due to this being a telehealth visit. Vitals that have been documented are verbally provided by the patient.  Patient was unable to self-report a recent blood pressure reading due to a lack of equipment at home via telehealth.  Subjective:   Stacy Chambers is a 57 y.o. female who presents for Medicare Annual (Subsequent) preventive examination.  Visit Complete: Virtual  I connected with  Stacy Chambers on 06/29/23 by a audio enabled telemedicine application and verified that I am speaking with the correct person using two identifiers.  Patient Location: Home  Provider Location: Home Office  I discussed the limitations of evaluation and management by telemedicine. The patient expressed understanding and agreed to proceed.  Patient Medicare AWV questionnaire was completed by the patient on n/a; I have confirmed that all information answered by patient is correct and no changes since this date.  Review of Systems     Cardiac Risk Factors include: advanced age (>67men, >63 women);dyslipidemia;hypertension;sedentary lifestyle;smoking/ tobacco exposure     Objective:    Today's Vitals   06/29/23 0758 06/29/23 0800  Weight: 169 lb (76.7 kg)   Height: 5\' 4"  (1.626 m)   PainSc:  0-No pain   Body mass index is 29.01 kg/m.     06/29/2023    7:57 AM 04/20/2022   10:02 AM 04/10/2020   12:32 PM 12/19/2019    8:13 AM 11/21/2018    8:10 AM 10/24/2018   10:06 AM 10/12/2018    8:17 AM  Advanced Directives  Does Patient Have a Medical Advance Directive? No No No No     Would patient like information on creating a medical advance directive? No - Patient declined Yes  (ED - Information included in AVS) No - Patient declined Yes (MAU/Ambulatory/Procedural Areas - Information given)        Information is confidential and restricted. Go to Review Flowsheets to unlock data.    Current Medications (verified) Outpatient Encounter Medications as of 06/29/2023  Medication Sig   clonazePAM (KLONOPIN) 0.5 MG tablet Take 1 tablet (0.5 mg total) by mouth 2 to 3  times a week as directed for severe anxiety attacks.   divalproex (DEPAKOTE ER) 250 MG 24 hr tablet Take 1 tablet (250 mg total) by mouth daily. Take along with 1000 mg daily , total of 1250 mg   divalproex (DEPAKOTE ER) 500 MG 24 hr tablet Take 2 tablets (1,000 mg total) by mouth daily. Take along with 250 mg daily - total of 1250 mg   escitalopram (LEXAPRO) 20 MG tablet Take 20 mg by mouth daily.   esomeprazole (NEXIUM) 20 MG capsule Take 1 capsule (20 mg total) by mouth daily as needed (take on empty stomach).   fluticasone (FLONASE) 50 MCG/ACT nasal spray Place 2 sprays into both nostrils daily.   ketoconazole (NIZORAL) 2 % shampoo Apply 1 Application topically 2 (two) times a week.   meloxicam (MOBIC) 15 MG tablet Take 1 tablet (15 mg total) by mouth daily.   methocarbamol (ROBAXIN) 500 MG tablet TAKE 1 TABLET EVERY DAY   pantoprazole (PROTONIX) 20 MG tablet Take 1 tablet (20 mg total) by mouth daily.   rosuvastatin (CRESTOR) 10  MG tablet Take 1 tablet (10 mg total) by mouth daily.   traZODone (DESYREL) 100 MG tablet Take 1-2 tablets (100-200 mg total) by mouth at bedtime as needed for sleep.   Vitamin D, Ergocalciferol, (DRISDOL) 1.25 MG (50000 UNIT) CAPS capsule Take 1 capsule (50,000 Units total) by mouth every 7 (seven) days.   loratadine (CLARITIN) 10 MG tablet Take 10 mg by mouth daily.   No facility-administered encounter medications on file as of 06/29/2023.    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   Chronic obstructive pulmonary disease (HCC) 10/03/2015   COPD (chronic obstructive pulmonary disease) (HCC)    no inhalers   Elevated liver function tests 11/26/2004   H/O NEGATIVE LIVER BIOPSY, workup negative   Gallstones 02/21/2015   GERD (gastroesophageal reflux disease)    Hyperlipidemia    Insomnia    Lower back pain    Osteoarthritis    Ovarian cancer (HCC) 1995   RLS (restless legs syndrome)    Sedative, hypnotic or anxiolytic dependence with withdrawal, 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HCC)    Wears dentures    full upper and lower   Past Surgical History:  Procedure Laterality Date   ABDOMINAL HYSTERECTOMY     APPENDECTOMY     BREAST BIOPSY Left 03/12/2015   fat necrosis   CESAREAN SECTION     COLONOSCOPY WITH PROPOFOL N/A 05/30/2018   Procedure: COLONOSCOPY WITH PROPOFOL;  Surgeon: Scot Jun, MD;  Location: [facility];  Service: Endoscopy;  Laterality: N/A;   EXCISION MORTON'S NEUROMA Left 10/07/2016   Procedure: EXCISION MORTON'S NEUROMA;  Surgeon: Gwyneth Revels, DPM;  Location: [facility];  Service: Podiatry;  Laterality: Left;  IV WITH LOCAL   LIVER BIOPSY  2005   PerPt: Done to eval elevated LFTs and biopsy provided no definite dx/cause of elevated LFTs   MULTIPLE TOOTH EXTRACTIONS     OOPHORECTOMY     OSTECTOMY Right 04/10/2020   Procedure: DOUBLE OSTEOTOMY GENERAL W/ LOCAL;  Surgeon: Gwyneth Revels, DPM;  Location: [facility];  Service: Podiatry;  Laterality: Right;   Family History  Adopted: Yes  Problem Relation Age of Onset   COPD Mother    Depression Mother    Colon cancer Mother        unsure of age   Cancer Father         Lung CA, Skin Cancer--?type   COPD Father    Stroke Father    Cancer Sister 67       cervical cancer   COPD Brother    Alcohol abuse Brother    Stroke Brother    COPD Maternal Aunt    Depression Daughter    Schizophrenia Son    Breast cancer Neg Hx    Social History   Socioeconomic History   Marital status: Widowed    Spouse name: Not on file   Number of children: Not on file   Years of education: Not on file   Highest education level: 10th grade  Occupational History   Not on file  Tobacco  Use   Smoking status: Former    Current packs/day: 0.00    Average packs/day: 1 pack/day for 30.0 years (30.0 ttl pk-yrs)    Types: Cigarettes    Start date: 04/09/1979    Quit date: 04/08/2009    Years since quitting: 14.2   Smokeless tobacco: Never  Vaping Use   Vaping status: Never Used  Substance and Sexual Activity   Alcohol use: Not Currently    Alcohol/week: 3.0 - 7.0 standard drinks of alcohol    Types: 3 - 7 Shots of liquor per week    Comment: occasionally   Drug use: Not Currently    Types: Marijuana    Comment: told to refrain until after surgery   Sexual activity: Yes    Birth control/protection: Surgical  Other Topics Concern   Not on file  Social History Narrative   Not on file   Social Determinants of Health   Financial Resource Strain: Low Risk  (06/29/2023)   Overall Financial Resource Strain (CARDIA)    Difficulty of Paying Living Expenses: Not hard at all  Food Insecurity: Food Insecurity Present (06/29/2023)   Hunger Vital Sign    Worried About Running Out of Food in the Last Year: Often true    Ran Out of Food in the Last Year: Often true  Transportation Needs: No Transportation Needs (06/29/2023)   PRAPARE - Administrator, Civil Service (Medical): No    Lack of Transportation (Non-Medical): No  Physical Activity: Inactive (06/29/2023)   Exercise Vital Sign    Days of Exercise per Week: 0 days    Minutes of Exercise per Session: 0 min   Stress: No Stress Concern Present (06/29/2023)   [employer] of Occupational Health - Occupational Stress Questionnaire    Feeling of Stress : Not at all  Social Connections: Patient Declined (06/29/2023)   Social Connection and Isolation Panel [NHANES]    Frequency of Communication with Friends and Family: Patient declined    Frequency of Social Gatherings with Friends and Family: Patient declined    Attends Religious Services: Patient declined    Database administrator or Organizations: Patient declined    Attends Engineer, structural: Patient declined    Marital Status: Patient declined    Tobacco Counseling Counseling given: Yes   Clinical Intake:  Pre-visit preparation completed: Yes  Pain : No/denies pain Pain Score: 0-No pain     BMI - recorded: 29.01 Nutritional Status: BMI 25 -29 Overweight Nutritional Risks: None Diabetes: No  How often do you need to have someone help you when you read instructions, pamphlets, or other written materials from your doctor or pharmacy?: 1 - Never  Interpreter Needed?: No  Information entered by ::  Kaislee Chao, CMA   Activities of Daily Living    06/29/2023    8:13 AM  In your present state of health, do you have any difficulty performing the following activities:  Hearing? 0  Vision? 0  Difficulty concentrating or making decisions? 0  Walking or climbing stairs? 0  Dressing or bathing? 0  Doing errands, shopping? 0  Preparing Food and eating ? N  Using the Toilet? N  In the past six months, have you accidently leaked urine? N  Do you have problems with loss of bowel control? N  Managing your Medications? N  Managing your Finances? N  Housekeeping or managing your Housekeeping? N    Patient Care Team: Gilmore Laroche, FNP as PCP - General (Family Medicine)  Earlene Plater, Rita Ohara, RPH (Inactive) as Pharmacist Scientist, research (physical sciences))  Indicate any recent Medical Services you may have received from other than [facility]  providers in the past year (date may be approximate).     Assessment:   This is a routine wellness examination for Stacy Chambers.  Hearing/Vision screen Hearing Screening - Comments:: Patient denies any hearing difficulties.   Vision Screening - Comments:: Wears rx glasses - is behind on yearly eye exams. Will call to make an appointment when she is able.   Dietary issues and exercise activities discussed:     Goals Addressed             This Visit's Progress    Patient Stated       "Live for another 25 years"       Depression Screen    06/29/2023    8:04 AM 06/10/2023    8:44 AM 06/10/2023    8:28 AM 05/11/2023    8:10 AM 04/05/2023   10:13 AM 03/05/2023    8:53 AM 02/15/2023   10:56 AM  PHQ 2/9 Scores  PHQ - 2 Score 1 3 0 0 0 0 0  PHQ- 9 Score 3 11 0 0 7 0 0    Fall Risk    06/29/2023    8:13 AM 06/10/2023    8:28 AM 05/11/2023    8:10 AM 04/05/2023   10:13 AM 03/05/2023    8:53 AM  Fall Risk   Falls in the past year? 0 0 0 0 0  Number falls in past yr: 0 0 0 0 0  Injury with Fall? 0 0 0 0 0  Risk for fall due to : No Fall Risks No Fall Risks No Fall Risks No Fall Risks No Fall Risks  Follow up Falls prevention discussed Falls evaluation completed Falls evaluation completed Falls evaluation completed Falls evaluation completed    MEDICARE RISK AT HOME: Medicare Risk at Home Any stairs in or around the home?: Yes If so, are there any without handrails?: No Home free of loose throw rugs in walkways, pet beds, electrical cords, etc?: Yes Adequate lighting in your home to reduce risk of falls?: Yes Life alert?: No Use of a cane, walker or w/c?: No Grab bars in the bathroom?: Yes Shower chair or bench in shower?: No Elevated toilet seat or a handicapped toilet?: Yes  TIMED UP AND GO:  Was the test performed?  No    Cognitive Function:        06/29/2023    8:02 AM 04/20/2022    9:50 AM 01/31/2019    9:37 AM  6CIT Screen  What Year? 0 points 0 points 0 points  What  month? 0 points 0 points 0 points  What time? 0 points 0 points 0 points  Count back from 20 0 points 0 points 2 points  Months in reverse 0 points 2 points 2 points  Repeat phrase 2 points 2 points 0 points  Total Score 2 points 4 points 4 points    Immunizations Immunization History  Administered Date(s) Administered   Influenza,inj,Quad PF,6+ Mos 06/26/2014, 06/14/2019, 09/16/2020, 06/26/2022   Influenza-Unspecified 09/14/2018   Tdap 11/22/2013    TDAP status: Up to date  Flu Vaccine status: Due, Education has been provided regarding the importance of this vaccine. Advised may receive this vaccine at local pharmacy or Health Dept. Aware to provide a copy of the vaccination record if obtained from local pharmacy or Health Dept. Verbalized acceptance and understanding.  Pneumococcal  vaccine status: NOT AGE APPROPRIATE FOR THIS PATIENT  Covid-19 vaccine status: Information provided on how to obtain vaccines.   Qualifies for Shingles Vaccine? Yes   Zostavax completed No   Shingrix Completed?: No.    Education has been provided regarding the importance of this vaccine. Patient has been advised to call insurance company to determine out of pocket expense if they have not yet received this vaccine. Advised may also receive vaccine at local pharmacy or Health Dept. Verbalized acceptance and understanding.  Screening Tests Health Maintenance  Topic Date Due   COVID-19 Vaccine (1) Never done   Zoster Vaccines- Shingrix (1 of 2) Never done   Medicare Annual Wellness (AWV)  04/21/2023   INFLUENZA VACCINE  05/27/2023   Colonoscopy  05/31/2023   DTaP/Tdap/Td (2 - Td or Tdap) 11/23/2023   Lung Cancer Screening  02/25/2024   MAMMOGRAM  05/25/2024   Hepatitis C Screening  Completed   HIV Screening  Completed   HPV VACCINES  Aged Out    Health Maintenance  Health Maintenance Due  Topic Date Due   COVID-19 Vaccine (1) Never done   Zoster Vaccines- Shingrix (1 of 2) Never done    Medicare Annual Wellness (AWV)  04/21/2023   INFLUENZA VACCINE  05/27/2023   Colonoscopy  05/31/2023    Colorectal cancer screening: Referral to GI placed 05/11/2023. Pt aware the office will call re: appt.  Mammogram status: Completed 05/26/2023. Repeat every year  Bone Density Screening: NOT AGE APPROPRIATE FOR THIS PATIENT  Lung Cancer Screening: (Low Dose CT Chest recommended if Age 70-80 years, 20 pack-year currently smoking OR have quit w/in 15years.) does qualify.   Lung Cancer Screening Referral: 04/05/2023 by primary care provider  Additional Screening:  Hepatitis C Screening: does not qualify; Completed 03/30/2022  Vision Screening: Recommended annual ophthalmology exams for early detection of glaucoma and other disorders of the eye. Is the patient up to date with their annual eye exam?  No  Who is the provider or what is the name of the office in which the patient attends annual eye exams? [facility] If pt is not established with a provider, would they like to be referred to a provider to establish care? No .   Dental Screening: Recommended annual dental exams for proper oral hygiene  Diabetic Foot Exam: n/a  Community Resource Referral / Chronic Care Management: CRR required this visit?  Yes   CCM required this visit?  No     Plan:     I have personally reviewed and noted the following in the patient's chart:   Medical and social history Use of alcohol, tobacco or illicit drugs  Current medications and supplements including opioid prescriptions. Patient is not currently taking opioid prescriptions. Functional ability and status Nutritional status Physical activity Advanced directives List of other physicians Hospitalizations, surgeries, and ER visits in previous 12 months Vitals Screenings to include cognitive, depression, and falls Referrals and appointments  In addition, I have reviewed and discussed with patient certain preventive  protocols, quality metrics, and best practice recommendations. A written personalized care plan for preventive services as well as general preventive health recommendations were provided to patient.     Jordan Hawks Truett Mcfarlan, CMA   06/29/2023   After Visit Summary: (MyChart) Due to this being a telephonic visit, the after visit summary with patients personalized plan was offered to patient via MyChart   Nurse Notes:

## 2023-06-30 ENCOUNTER — Other Ambulatory Visit (HOSPITAL_COMMUNITY): Payer: Self-pay

## 2023-06-30 MED ORDER — KETOCONAZOLE 2 % EX SHAM
1.0000 | MEDICATED_SHAMPOO | CUTANEOUS | 0 refills | Status: DC
  Filled 2023-06-30 (×2): qty 120, 30d supply, fill #0
  Filled 2023-07-05: qty 120, 84d supply, fill #0

## 2023-07-01 ENCOUNTER — Encounter: Payer: Self-pay | Admitting: Psychiatry

## 2023-07-01 ENCOUNTER — Telehealth (INDEPENDENT_AMBULATORY_CARE_PROVIDER_SITE_OTHER): Payer: HMO | Admitting: Psychiatry

## 2023-07-01 ENCOUNTER — Other Ambulatory Visit (HOSPITAL_COMMUNITY): Payer: Self-pay

## 2023-07-01 DIAGNOSIS — F411 Generalized anxiety disorder: Secondary | ICD-10-CM

## 2023-07-01 DIAGNOSIS — F3162 Bipolar disorder, current episode mixed, moderate: Secondary | ICD-10-CM

## 2023-07-01 DIAGNOSIS — F5105 Insomnia due to other mental disorder: Secondary | ICD-10-CM | POA: Diagnosis not present

## 2023-07-01 DIAGNOSIS — F3178 Bipolar disorder, in full remission, most recent episode mixed: Secondary | ICD-10-CM

## 2023-07-01 DIAGNOSIS — G2581 Restless legs syndrome: Secondary | ICD-10-CM | POA: Diagnosis not present

## 2023-07-01 MED ORDER — DIVALPROEX SODIUM ER 500 MG PO TB24
1000.0000 mg | ORAL_TABLET | Freq: Every day | ORAL | 0 refills | Status: DC
  Filled 2023-07-01 – 2023-07-28 (×2): qty 180, 90d supply, fill #0

## 2023-07-01 MED ORDER — ESCITALOPRAM OXALATE 20 MG PO TABS
20.0000 mg | ORAL_TABLET | Freq: Every day | ORAL | 0 refills | Status: DC
  Filled 2023-07-01 – 2023-07-28 (×2): qty 90, 90d supply, fill #0

## 2023-07-01 NOTE — Progress Notes (Signed)
Virtual Visit via Video Note  I connected with Stacy Chambers on 07/01/23 at  2:30 PM EDT by a video enabled telemedicine application and verified that I am speaking with the correct person using two identifiers.  Location Provider Location : [facility] Patient Location : Home  Participants: Patient , Provider     I discussed the limitations of evaluation and management by telemedicine and the availability of in person appointments. The patient expressed understanding and agreed to proceed.    I discussed the assessment and treatment plan with the patient. The patient was provided an opportunity to ask questions and all were answered. The patient agreed with the plan and demonstrated an understanding of the instructions.   The patient was advised to call back or seek an in-person evaluation if the symptoms worsen or if the condition fails to improve as anticipated.   BH MD OP Progress Note  07/02/2023 8:59 AM Stacy Chambers  MRN:  [id number]  Chief Complaint:  Chief Complaint  Patient presents with   Follow-up   Depression   Anxiety   Medication Refill   HPI: Stacy Chambers is a 57 year old Caucasian female, lives in [place], has a history of bipolar disorder, GAD, insomnia, bereavement, RLS, MCI, vitamin B12 deficiency was evaluated by telemedicine today.  Patient today reports she is currently not depressed.  Reports her mood swings, anxiety and racing thoughts have improved.  She does struggle with hot flashes and it does not happen all the time.  She reports she was able to get an over-the-counter medications for menopausal symptoms which has a black cohosh, ashwaganda.  She reports when she takes it it does help with her symptoms.  She does have restless leg symptoms on and off at night.  Discussed trial of Requip however patient declines.  She does not want to add any medications that can cause side effects.  Patient currently compliant on Lexapro, Depakote, trazodone as  needed.  Denies side effects.  Patient denies any suicidality, homicidality or perceptual disturbances.  Patient reports she continues to follow-up with her therapist.  Motivated to stay in therapy.  Patient denies any other concerns today.  Visit Diagnosis:    ICD-10-CM   1. Bipolar disorder, in full remission, most recent episode mixed (HCC)  F31.78 escitalopram (LEXAPRO) 20 MG tablet   type 1 , moderate    2. GAD (generalized anxiety disorder)  F41.1 escitalopram (LEXAPRO) 20 MG tablet    divalproex (DEPAKOTE ER) 500 MG 24 hr tablet    3. Insomnia due to mental disorder  F51.05    anxiety, RLS    4. RLS (restless legs syndrome)  G25.81       Past Psychiatric History: I have reviewed past psychiatric history from progress note on 10/12/2018.  Past trials of medications like Geodon, Vraylar, Zyprexa, risperidone, BuSpar, Zoloft, Wellbutrin, Lamictal, carbamazepine.  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   Chronic obstructive pulmonary disease (HCC) 10/03/2015   COPD (chronic obstructive pulmonary disease) (HCC)    no inhalers   Elevated liver function tests 11/26/2004   H/O NEGATIVE LIVER  BIOPSY, workup negative   Gallstones 02/21/2015   GERD (gastroesophageal reflux disease)    Hyperlipidemia    Insomnia    Lower back pain    Osteoarthritis    Ovarian cancer (HCC) 1995   RLS (restless legs syndrome)    Sedative, hypnotic or anxiolytic dependence with withdrawal, 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HCC)    Wears dentures    full upper and lower    Past Surgical History:  Procedure Laterality Date   ABDOMINAL HYSTERECTOMY     APPENDECTOMY     BREAST BIOPSY Left  03/12/2015   fat necrosis   CESAREAN SECTION     COLONOSCOPY WITH PROPOFOL N/A 05/30/2018   Procedure: COLONOSCOPY WITH PROPOFOL;  Surgeon: Scot Jun, MD;  [facility];  Service: Endoscopy;  Laterality: N/A;   EXCISION MORTON'S NEUROMA Left 10/07/2016   Procedure: EXCISION MORTON'S NEUROMA;  Surgeon: Gwyneth Revels, DPM;  Location: [facility];  Service: Podiatry;  Laterality: Left;  IV WITH LOCAL   LIVER BIOPSY  2005   PerPt: Done to eval elevated LFTs and biopsy provided no definite dx/cause of elevated LFTs   MULTIPLE TOOTH EXTRACTIONS     OOPHORECTOMY     OSTECTOMY Right 04/10/2020   Procedure: DOUBLE OSTEOTOMY GENERAL W/ LOCAL;  Surgeon: Gwyneth Revels, DPM;  Location: [facility];  Service: Podiatry;  Laterality: Right;    Family Psychiatric History: I have reviewed family psychiatric history from progress note on 10/12/2018.  Family History:  Family History  Adopted: Yes  Problem Relation Age of Onset   COPD Mother    Depression Mother    Colon cancer Mother        unsure of age   Cancer Father        Lung CA, Skin Cancer--?type   COPD Father    Stroke Father    Cancer Sister 25       cervical cancer   COPD Brother    Alcohol abuse Brother    Stroke Brother    COPD Maternal Aunt    Depression Daughter    Schizophrenia Son    Breast cancer Neg Hx     Social History: Reviewed family psychiatric history from progress note on 10/12/2018. Social History   Socioeconomic History   Marital status: Widowed    Spouse name: Not on file   Number of children: Not on file   Years of education: Not on file   Highest education level: 10th grade  Occupational History   Not on file  Tobacco Use   Smoking status: Former    Current packs/day: 0.00    Average packs/day: 1 pack/day for 30.0 years (30.0 ttl pk-yrs)    Types: Cigarettes    Start date: 04/09/1979    Quit date: 04/08/2009    Years since quitting: 14.2   Smokeless tobacco: Never   Vaping Use   Vaping status: Never Used  Substance and Sexual Activity   Alcohol use: Not Currently    Alcohol/week: 3.0 - 7.0 standard drinks of alcohol    Types: 3 - 7 Shots of liquor per week    Comment: occasionally   Drug use: Not Currently    Types: Marijuana    Comment: told to refrain until after surgery   Sexual activity: Yes    Birth control/protection: Surgical  Other Topics Concern   Not on file  Social History Narrative   Not on file   Social Determinants of Health   Financial Resource Strain: Low Risk  (06/29/2023)  Overall Financial Resource Strain (CARDIA)    Difficulty of Paying Living Expenses: Not hard at all  Food Insecurity: Food Insecurity Present (06/29/2023)   Hunger Vital Sign    Worried About Running Out of Food in the Last Year: Often true    Ran Out of Food in the Last Year: Often true  Transportation Needs: No Transportation Needs (06/29/2023)   PRAPARE - Administrator, Civil Service (Medical): No    Lack of Transportation (Non-Medical): No  Physical Activity: Inactive (06/29/2023)   Exercise Vital Sign    Days of Exercise per Week: 0 days    Minutes of Exercise per Session: 0 min  Stress: No Stress Concern Present (06/29/2023)   [employer] of Occupational Health - Occupational Stress Questionnaire    Feeling of Stress : Not at all  Social Connections: Patient Declined (06/29/2023)   Social Connection and Isolation Panel [NHANES]    Frequency of Communication with Friends and Family: Patient declined    Frequency of Social Gatherings with Friends and Family: Patient declined    Attends Religious Services: Patient declined    Database administrator or Organizations: Patient declined    Attends Banker Meetings: Patient declined    Marital Status: Patient declined    Allergies:  Allergies  Allergen Reactions   Benadryl [Diphenhydramine Hcl]     Hives/ rapid heartrate   Codeine     dizziness   Vicodin  [Hydrocodone-Acetaminophen] Other (See Comments)    dizziness    Metabolic Disorder Labs: Lab Results  Component Value Date   HGBA1C 5.4 10/30/2022   MPG 114 09/14/2018   No results found for: "PROLACTIN" Lab Results  Component Value Date   CHOL 172 10/30/2022   TRIG 205 (H) 10/30/2022   HDL 55 10/30/2022   CHOLHDL 3.1 10/30/2022   VLDL 18 03/04/2015   LDLCALC 83 10/30/2022   LDLCALC 56 03/30/2022   Lab Results  Component Value Date   TSH 1.700 10/30/2022   TSH 2.600 03/30/2022    Therapeutic Level Labs: Lab Results  Component Value Date   LITHIUM 0.3 (L) 10/24/2018   LITHIUM 0.49 (L) 01/13/2009   Lab Results  Component Value Date   VALPROATE 70 11/11/2022   VALPROATE 29 (L) 06/02/2022   No results found for: "CBMZ"  Current Medications: Current Outpatient Medications  Medication Sig Dispense Refill   clonazePAM (KLONOPIN) 0.5 MG tablet Take 1 tablet (0.5 mg total) by mouth 2 to 3  times a week as directed for severe anxiety attacks. 6 tablet 1   divalproex (DEPAKOTE ER) 250 MG 24 hr tablet Take 1 tablet (250 mg total) by mouth daily. Take along with 1000 mg daily , total of 1250 mg 90 tablet 1   divalproex (DEPAKOTE ER) 500 MG 24 hr tablet Take 2 tablets (1,000 mg total) by mouth daily. Take along with 250 mg daily - total of 1250 mg 180 tablet 0   escitalopram (LEXAPRO) 20 MG tablet Take 1 tablet (20 mg total) by mouth daily. 90 tablet 0   esomeprazole (NEXIUM) 20 MG capsule Take 1 capsule (20 mg total) by mouth daily as needed (take on empty stomach). 90 capsule 1   fluticasone (FLONASE) 50 MCG/ACT nasal spray Place 2 sprays into both nostrils daily. 16 g 2   ketoconazole (NIZORAL) 2 % shampoo Apply 1 Application topically 2 (two) times a week. 120 mL 0   loratadine (CLARITIN) 10 MG tablet Take 10 mg by mouth daily.  meloxicam (MOBIC) 15 MG tablet Take 1 tablet (15 mg total) by mouth daily. 90 tablet 1   methocarbamol (ROBAXIN) 500 MG tablet TAKE 1 TABLET  EVERY DAY 60 tablet 3   pantoprazole (PROTONIX) 20 MG tablet Take 1 tablet (20 mg total) by mouth daily. 30 tablet 1   rosuvastatin (CRESTOR) 10 MG tablet Take 1 tablet (10 mg total) by mouth daily. 90 tablet 1   traZODone (DESYREL) 100 MG tablet Take 1-2 tablets (100-200 mg total) by mouth at bedtime as needed for sleep. 180 tablet 1   Vitamin D, Ergocalciferol, (DRISDOL) 1.25 MG (50000 UNIT) CAPS capsule Take 1 capsule (50,000 Units total) by mouth every 7 (seven) days. 20 capsule 3   No current facility-administered medications for this visit.     Musculoskeletal: Strength & Muscle Tone:  UTA Gait & Station:  Seated Patient leans: N/A  Psychiatric Specialty Exam: Review of Systems  Psychiatric/Behavioral:  Positive for sleep disturbance. The patient is nervous/anxious.     There were no vitals taken for this visit.There is no height or weight on file to calculate BMI.  General Appearance: Fairly Groomed  Eye Contact:  Fair  Speech:  Clear and Coherent  Volume:  Normal  Mood:  Anxious improving  Affect:  Congruent  Thought Process:  Goal Directed and Descriptions of Associations: Intact  Orientation:  Full (Time, Place, and Person)  Thought Content: Logical   Suicidal Thoughts:  No  Homicidal Thoughts:  No  Memory:  Immediate;   Fair Recent;   Fair Remote;   Fair  Judgement:  Fair  Insight:  Fair  Psychomotor Activity:  Normal  Concentration:  Concentration: Fair and Attention Span: Fair  Recall:  [employer] of Knowledge: Fair  Language: Fair  Akathisia:  No  Handed:  Left  AIMS (if indicated): not done  Assets:  Communication Skills Desire for Improvement Housing Social Support  ADL's:  Intact  Cognition: WNL  Sleep:   improving - RLS causes sleep problems    Screenings: AIMS    Flowsheet Row Video Visit from 04/06/2023 in [facility] Office Visit from 11/23/2022 in [facility]  Video Visit from 06/01/2022 in [facility] Video Visit from 03/24/2022 in [facility] Video Visit from 06/04/2021 in [facility]  AIMS Total Score 0 0 0 0 0      GAD-7    Flowsheet Row Office Visit from 06/10/2023 in [facility] Office Visit from 05/11/2023 in [facility] Office Visit from 04/05/2023 in [facility] Office Visit from 03/05/2023 in [facility] Video Visit from 02/15/2023 in [facility]  Total GAD-7 Score 0 0 7 0 0      PHQ2-9    Flowsheet Row Clinical Support from 06/29/2023 in [facility] Office Visit from 06/10/2023 in [facility] Office Visit from 05/11/2023 in [facility] Office Visit from 04/05/2023 in [facility] Office Visit from 03/05/2023 in [facility]  PHQ-2 Total Score 1 3 0 0 0  PHQ-9 Total Score 3 11 0 7 0      Flowsheet Row Video Visit from 07/01/2023 in [facility] Video Visit from 06/08/2023 in [facility] Video Visit from  04/06/2023 in [facility]  C-SSRS RISK CATEGORY No Risk No Risk No Risk        Assessment and Plan: Stacy Chambers is a 57 year old Caucasian female, widowed, lives in [place], has a history of bipolar disorder, anxiety disorder, COPD, RLS, vitamin B12 deficiency was evaluated by telemedicine today.  Patient is currently improving, although she struggles with restless leg symptoms, declines medication changes, plan as noted below.  Plan Bipolar disorder type I moderate in remission most recent episode mixed Depakote ER 1250 mg p.o. daily Depakote  level-11/11/2022-70-therapeutic Continue trazodone 100-200 mg p.o. nightly as needed   GAD-improving Continue Lexapro 20 mg p.o. daily Continue CBT with Mr. Suzan Garibaldi.  RLS-unstable Patient declines trial of Requip, reports walking, massaging helps and she wants to try that at this time she will let this writer know if she is interested in medication in the future.  Insomnia-improving Patient to continue trazodone 100-200 mg p.o. nightly as needed.  Collaboration of Care: Collaboration of Care: Referral or follow-up with counselor/therapist AEB encouraged to continue CBT.  Patient/Guardian was advised Release of Information must be obtained prior to any record release in order to collaborate their care with an outside provider. Patient/Guardian was advised if they have not already done so to contact the registration department to sign all necessary forms in order for [name] to release information regarding their care.   Consent: Patient/Guardian gives verbal consent for treatment and assignment of benefits for services provided during this visit. Patient/Guardian expressed understanding and agreed to proceed.   Follow-up in clinic in 3 to 4 months or sooner in person.  This note was generated in part or whole with voice recognition software. Voice recognition is usually quite accurate but there are transcription errors that can and very often do occur. I apologize for any typographical errors that were not detected and corrected.    Jomarie Longs, MD 07/02/2023, 8:59 AM

## 2023-07-02 ENCOUNTER — Other Ambulatory Visit (HOSPITAL_COMMUNITY): Payer: Self-pay

## 2023-07-05 ENCOUNTER — Other Ambulatory Visit: Payer: Self-pay

## 2023-07-06 ENCOUNTER — Ambulatory Visit (INDEPENDENT_AMBULATORY_CARE_PROVIDER_SITE_OTHER): Payer: HMO | Admitting: Clinical

## 2023-07-06 DIAGNOSIS — F3131 Bipolar disorder, current episode depressed, mild: Secondary | ICD-10-CM | POA: Diagnosis not present

## 2023-07-06 DIAGNOSIS — R41840 Attention and concentration deficit: Secondary | ICD-10-CM

## 2023-07-06 DIAGNOSIS — F5105 Insomnia due to other mental disorder: Secondary | ICD-10-CM | POA: Diagnosis not present

## 2023-07-06 DIAGNOSIS — F411 Generalized anxiety disorder: Secondary | ICD-10-CM

## 2023-07-06 NOTE — Progress Notes (Signed)
Virtual Visit via Video Note   I connected with Stacy Chambers on 07/06/23 at  10:00 AM EST by a video enabled telemedicine application and verified that I am speaking with the correct person using two identifiers.   Location: Patient: Home Provider: Office   I discussed the limitations of evaluation and management by telemedicine and the availability of in person appointments. The patient expressed understanding and agreed to proceed.     THERAPIST PROGRESS NOTE   Session Time: 10:00 AM-10:25 AM   Participation Level: Active   Behavioral Response: CasualAlertDepressed   Type of Therapy: Individual Therapy   Treatment Goals addressed: Coping   Interventions: CBT   Summary: Stacy Chambers. Traum is a 57 y.o. female who presents with Bipolar Disorder./ GAD/Insomnia. The OPT therapist worked with the patient for her ongoing OPT treatment session. The OPT therapist utilized Motivational Interviewing to assist in creating therapeutic repore. The patient in the session was engaged and work in collaboration giving feedback about her triggers and symptoms over the past few weeks.The patient spoke changes with her med therapy through her work with psychiatrist Dr. Elna Breslow. The OPT therapist utilized Cognitive Behavioral Therapy through cognitive restructuring as well as worked with the patient on coping strategies to assist in management of mood. The OPT therapist worked with the patient on continuing to maintain her basic needs of self care with eating, hygiene, and sleep. The patient notes she is doing well with her interactions with others in the home.The patient spoke about her current relationship and noted things are going well and looking forward to celebrating her birthday with friends and loved ones in the upcoming weekend. The OPT therapist worked with the patient in taking advantage of change of environment with better Fall weather and leveraging this as part of her coping strategies to be more  active within her physical health limits.   Suicidal/Homicidal: Nowithout intent/plan   Therapist Response: The OPT therapist worked with the patient for the patients scheduled session. The patient was engaged in her session and gave feedback in relation to triggers, symptoms, and behavior responses over the past few weeks. The OPT therapist worked with the patient utilizing an in session Cognitive Behavioral Therapy exercise. The patient was responsive in the session and verbalized," I am looking to celebrate my birthday this weekend with my boyfriend and some friends over Friday and Saturday night". The OPT therapist worked with the patient on staying active , managing her basic health needs, and being consistent with her medication therapy plan. The OPT therapist worked with the patient gauging activity level, coping skill implementation, and mood. The patient spoke about enjoying the cooler weather and worked with the OPT therapist to incorporate being more active within her physical limits with the better weather and reduce isolation  .The OPT therapist will continue treatment work with the patient in her next scheduled session    Plan: Return again in 3 weeks.   Diagnosis:      Axis I: Bipolar 1 disorder,depressed mild/GAD/Insomnia/ Attention deficit.                             Axis II: No diagnosis    Collaboration of Care: Collaboration of care reviewed with medication therapy involvement with psychiatrist Dr. Elna Breslow.   Patient/Guardian was advised Release of Information must be obtained prior to any record release in order to collaborate their care with an outside provider. Patient/Guardian was advised if they  have not already done so to contact the registration department to sign all necessary forms in order for [name] to release information regarding their care.    Consent: Patient/Guardian gives verbal consent for treatment and assignment of benefits for services provided during this visit.  Patient/Guardian expressed understanding and agreed to proceed.        I discussed the assessment and treatment plan with the patient. The patient was provided an opportunity to ask questions and all were answered. The patient agreed with the plan and demonstrated an understanding of the instructions.   The patient was advised to call back or seek an in-person evaluation if the symptoms worsen or if the condition fails to improve as anticipated.   I provided 30 minutes of non-face-to-face time during this encounter.   Stacy Burn, LCSW   07/06/2023

## 2023-07-07 ENCOUNTER — Ambulatory Visit: Payer: Self-pay

## 2023-07-07 NOTE — Patient Outreach (Signed)
  Care Coordination   Initial Visit Note   07/07/2023 Name: MERCEDEZ ROUNDTREE MRN: [id number] DOB: 1965/11/22  SHALAE LAMKE is a 57 y.o. year old female who sees Gilmore Laroche, FNP for primary care. I spoke with  Tamala Ser by phone today.  What matters to the patients health and wellness today?  Patient receives OTC assistance with insurance, Foodstamps, Water quality scientist and community food bank assistance.  Patient averages about $200 a month for food.     Goals Addressed             This Visit's Progress    Food assistance       Interventions Today    Flowsheet Row Most Recent Value  General Interventions   General Interventions Discussed/Reviewed General Interventions Discussed, General Interventions Reviewed, [employer]  [Pt reports foodstamps and food bank assistance is not enough.  Pt receives SSI/SSA but due to bills she runs low on funds for food.  SW to provider additional food bank list via email.]              SDOH assessments and interventions completed:  Yes  SDOH Interventions Today    Flowsheet Row Most Recent Value  SDOH Interventions   Food Insecurity Interventions Other (Comment)  [Receives Foodstamps and food banks]  Housing Interventions Intervention Not Indicated  Transportation Interventions Intervention Not Indicated  [Has transportatioin]        Care Coordination Interventions:  Yes, provided   Follow up plan: No further intervention required.   Encounter Outcome:  Patient Visit Completed

## 2023-07-07 NOTE — Patient Instructions (Signed)
Visit Information  Thank you for taking time to visit with me today. Please don't hesitate to contact me if I can be of assistance to you.   Following are the goals we discussed today:  Patient to contact food banks for additional food resources.   SW to email food bank list.   If you are experiencing a Mental Health or Behavioral Health Crisis or need someone to talk to, please call 911  Patient verbalizes understanding of instructions and care plan provided today and agrees to view in MyChart. Active MyChart status and patient understanding of how to access instructions and care plan via MyChart confirmed with patient.     No further follow up required: Patient does not request an additional visit and will be able to contact resources provided.  Lysle Morales, BSW Social Worker [phone]

## 2023-07-16 ENCOUNTER — Other Ambulatory Visit (HOSPITAL_COMMUNITY): Payer: Self-pay

## 2023-07-19 ENCOUNTER — Other Ambulatory Visit: Payer: Self-pay

## 2023-07-22 NOTE — Telephone Encounter (Signed)
Ok to schedule. ASA 3.  ? ?

## 2023-07-26 NOTE — Telephone Encounter (Signed)
Called to schedule pt, gave her date of 09/13/23 at 8:45 am, pt is wanting to know about different dates to be scheduled. Advised her that I would have to call her back once we get providers schedule. Will hold above date until I find out what pt wants to to.

## 2023-07-28 ENCOUNTER — Other Ambulatory Visit: Payer: Self-pay | Admitting: Psychiatry

## 2023-07-28 ENCOUNTER — Other Ambulatory Visit: Payer: Self-pay

## 2023-07-28 DIAGNOSIS — F411 Generalized anxiety disorder: Secondary | ICD-10-CM

## 2023-07-28 MED ORDER — NA SULFATE-K SULFATE-MG SULF 17.5-3.13-1.6 GM/177ML PO SOLN
ORAL | 0 refills | Status: DC
  Filled 2023-07-28: qty 354, 1d supply, fill #0

## 2023-07-28 NOTE — Telephone Encounter (Signed)
PT CALLED BACK. Scheduled for 11/25. Aware will send instructions/pre-op. Rx for prep will be sent to pharmacy.

## 2023-07-28 NOTE — Addendum Note (Signed)
Addended by: Armstead Peaks on: 07/28/2023 01:34 PM   Modules accepted: Orders

## 2023-07-29 ENCOUNTER — Other Ambulatory Visit (HOSPITAL_COMMUNITY): Payer: Self-pay

## 2023-07-29 ENCOUNTER — Encounter: Payer: Self-pay | Admitting: *Deleted

## 2023-07-29 ENCOUNTER — Other Ambulatory Visit: Payer: Self-pay

## 2023-07-29 MED ORDER — TRAZODONE HCL 100 MG PO TABS
100.0000 mg | ORAL_TABLET | Freq: Every evening | ORAL | 1 refills | Status: DC | PRN
  Filled 2023-07-29 – 2023-10-03 (×3): qty 180, 90d supply, fill #0
  Filled 2023-12-26: qty 180, 90d supply, fill #1

## 2023-07-29 NOTE — Telephone Encounter (Signed)
Referral completed, TCS apt letter sent to PCP

## 2023-08-10 ENCOUNTER — Ambulatory Visit (INDEPENDENT_AMBULATORY_CARE_PROVIDER_SITE_OTHER): Payer: HMO | Admitting: Clinical

## 2023-08-10 DIAGNOSIS — F411 Generalized anxiety disorder: Secondary | ICD-10-CM

## 2023-08-10 DIAGNOSIS — R41840 Attention and concentration deficit: Secondary | ICD-10-CM | POA: Diagnosis not present

## 2023-08-10 DIAGNOSIS — F5105 Insomnia due to other mental disorder: Secondary | ICD-10-CM | POA: Diagnosis not present

## 2023-08-10 DIAGNOSIS — F3131 Bipolar disorder, current episode depressed, mild: Secondary | ICD-10-CM | POA: Diagnosis not present

## 2023-08-10 NOTE — Progress Notes (Signed)
Virtual Visit via Video Note   I connected with Stacy Chambers on 08/10/23 at  10:00 AM EST by a video enabled telemedicine application and verified that I am speaking with the correct person using two identifiers.   Location: Patient: Home Provider: Office   I discussed the limitations of evaluation and management by telemedicine and the availability of in person appointments. The patient expressed understanding and agreed to proceed.     THERAPIST PROGRESS NOTE   Session Time: 10:00 AM-10:25 AM   Participation Level: Active   Behavioral Response: CasualAlertDepressed   Type of Therapy: Individual Therapy   Treatment Goals addressed: Coping   Interventions: CBT   Summary: Stacy Chambers is a 57 y.o. female who presents with Bipolar Disorder./ GAD/Insomnia. The OPT therapist worked with the patient for her ongoing OPT treatment session. The OPT therapist utilized Motivational Interviewing to assist in creating therapeutic repore. The patient in the session was engaged and work in collaboration giving feedback about her triggers and symptoms over the past few weeks.The patient spoke ongoing development in her relationship and noted that she is currently engaged. The patient spoke about her focus on looking forward and her future and this helping her with move forward from her grief around her late husband. The OPT therapist utilized Cognitive Behavioral Therapy through cognitive restructuring as well as worked with the patient on coping strategies to assist in management of mood. The OPT therapist worked with the patient on continuing to maintain her basic needs of self care with eating, hygiene, and sleep. The patient notes she is doing well with her interactions with others in the home.The patient spoke about working to find her new routine for her self and as part of her patterns within her relationship. The OPT therapist worked with the patient in taking advantage of change of  environment with Fall weather and leveraging this as part of her coping strategies to be more active within her physical health limits.   Suicidal/Homicidal: Nowithout intent/plan   Therapist Response: The OPT therapist worked with the patient for the patients scheduled session. The patient was engaged in her session and gave feedback in relation to triggers, symptoms, and behavior responses over the past few weeks. The OPT therapist worked with the patient utilizing an in session Cognitive Behavioral Therapy exercise. The patient was responsive in the session and verbalized," I got engaged and it was funny he proposed in the [employer] parking lot and I said no not in the parking lot so we waited and then he did again when we were home and I said yes". The OPT therapist worked with the patient on staying active , managing her basic health needs, and being consistent with her medication therapy plan. The OPT therapist worked with the patient gauging activity level, coping skill implementation, and mood. The patient spoke about enjoying the cooler weather and worked with the OPT therapist to incorporate being more active within her physical limits with the better weather and reduce isolation. The patient spoke about the focus of looking forward to her future and getting married and this helping her to start a new chapter.in her life. The OPT therapist will continue treatment work with the patient in her next scheduled session    Plan: Return again in 3 weeks.   Diagnosis:      Axis I: Bipolar 1 disorder,depressed mild/GAD/Insomnia/ Attention deficit.  Axis II: No diagnosis    Collaboration of Care: Collaboration of care reviewed with medication therapy involvement with psychiatrist Dr. Elna Breslow.   Patient/Guardian was advised Release of Information must be obtained prior to any record release in order to collaborate their care with an outside provider. Patient/Guardian was advised  if they have not already done so to contact the registration department to sign all necessary forms in order for [name] to release information regarding their care.    Consent: Patient/Guardian gives verbal consent for treatment and assignment of benefits for services provided during this visit. Patient/Guardian expressed understanding and agreed to proceed.        I discussed the assessment and treatment plan with the patient. The patient was provided an opportunity to ask questions and all were answered. The patient agreed with the plan and demonstrated an understanding of the instructions.   The patient was advised to call back or seek an in-person evaluation if the symptoms worsen or if the condition fails to improve as anticipated.   I provided 25 minutes of non-face-to-face time during this encounter.   [name], LCSW   08/10/2023

## 2023-08-16 ENCOUNTER — Other Ambulatory Visit: Payer: Self-pay

## 2023-08-16 ENCOUNTER — Other Ambulatory Visit: Payer: Self-pay | Admitting: Family Medicine

## 2023-08-16 DIAGNOSIS — R1084 Generalized abdominal pain: Secondary | ICD-10-CM

## 2023-08-16 MED ORDER — PANTOPRAZOLE SODIUM 20 MG PO TBEC
20.0000 mg | DELAYED_RELEASE_TABLET | Freq: Every day | ORAL | 1 refills | Status: DC
  Filled 2023-08-16: qty 30, 30d supply, fill #0
  Filled 2023-09-07 – 2023-10-14 (×2): qty 30, 30d supply, fill #1

## 2023-08-17 ENCOUNTER — Other Ambulatory Visit: Payer: Self-pay

## 2023-08-24 ENCOUNTER — Encounter (HOSPITAL_COMMUNITY): Payer: Self-pay

## 2023-08-24 ENCOUNTER — Ambulatory Visit (HOSPITAL_COMMUNITY): Payer: HMO | Admitting: Clinical

## 2023-08-31 ENCOUNTER — Ambulatory Visit (INDEPENDENT_AMBULATORY_CARE_PROVIDER_SITE_OTHER): Payer: HMO | Admitting: Clinical

## 2023-08-31 DIAGNOSIS — R41840 Attention and concentration deficit: Secondary | ICD-10-CM | POA: Diagnosis not present

## 2023-08-31 DIAGNOSIS — F5105 Insomnia due to other mental disorder: Secondary | ICD-10-CM

## 2023-08-31 DIAGNOSIS — F3131 Bipolar disorder, current episode depressed, mild: Secondary | ICD-10-CM | POA: Diagnosis not present

## 2023-08-31 DIAGNOSIS — F411 Generalized anxiety disorder: Secondary | ICD-10-CM | POA: Diagnosis not present

## 2023-08-31 NOTE — Progress Notes (Signed)
Virtual Visit via Video Note   I connected with Stacy Chambers on 08/31/23 at  11:00 AM EST by a video enabled telemedicine application and verified that I am speaking with the correct person using two identifiers.   Location: Patient: Home Provider: Office   I discussed the limitations of evaluation and management by telemedicine and the availability of in person appointments. The patient expressed understanding and agreed to proceed.     THERAPIST PROGRESS NOTE  Session Time: 11:00 AM-11:25 AM   Participation Level: Active   Behavioral Response: CasualAlertDepressed   Type of Therapy: Individual Therapy   Treatment Goals addressed: Coping   Interventions: CBT   Summary: Stacy Chambers is a 57 y.o. female who presents with Bipolar Disorder./ GAD/Insomnia. The OPT therapist worked with the patient for her ongoing OPT treatment session. The OPT therapist utilized Motivational Interviewing to assist in creating therapeutic repore. The patient in the session was engaged and work in collaboration giving feedback about her triggers and symptoms over the past few weeks.The patient spoke ongoing development in her relationship and planning for getting married next year in the Spring. The patient spoke about her focus on looking forward and her future and this helping her with move forward from her grief around her late husband. The OPT therapist utilized Cognitive Behavioral Therapy through cognitive restructuring as well as worked with the patient on coping strategies to assist in management of mood. The OPT therapist worked with the patient on continuing to maintain her basic needs of self care with eating, hygiene, and sleep. The patient spoke about ongoing work to regulate her sleep cycle and intent to talk with her PCP about sleep aid. The OPT therapist worked with the patient in taking advantage of change of environment with Fall weather and leveraging this as part of her coping strategies  to be more active within her physical health limits.   Suicidal/Homicidal: Nowithout intent/plan   Therapist Response: The OPT therapist worked with the patient for the patients scheduled session. The patient was engaged in her session and gave feedback in relation to triggers, symptoms, and behavior responses over the past few weeks. The OPT therapist worked with the patient utilizing an in session Cognitive Behavioral Therapy exercise. The patient was responsive in the session and verbalized," We are looking forward to Thanksgiving and Christmas... we are planning for a Wedding in the Spring next year". The OPT therapist worked with the patient on staying active , managing her basic health needs, and being consistent with her medication therapy plan. The OPT therapist worked with the patient gauging activity level, coping skill implementation, and mood. The patient spoke about enjoying decorating for the holidays. The patient spoke about the intent in meeting with her PCP next week to ask for help with sleep aid to regulate her sleep cycle. The OPT therapist will continue treatment work with the patient in her next scheduled session    Plan: Return again in 3 weeks.   Diagnosis:      Axis I: Bipolar 1 disorder,depressed mild/GAD/Insomnia/ Attention deficit.                             Axis II: No diagnosis    Collaboration of Care: Collaboration of care reviewed with medication therapy involvement with psychiatrist Dr. Elna Breslow.   Patient/Guardian was advised Release of Information must be obtained prior to any record release in order to collaborate their care with an  outside provider. Patient/Guardian was advised if they have not already done so to contact the registration department to sign all necessary forms in order for [name] to release information regarding their care.    Consent: Patient/Guardian gives verbal consent for treatment and assignment of benefits for services provided during this  visit. Patient/Guardian expressed understanding and agreed to proceed.        I discussed the assessment and treatment plan with the patient. The patient was provided an opportunity to ask questions and all were answered. The patient agreed with the plan and demonstrated an understanding of the instructions.   The patient was advised to call back or seek an in-person evaluation if the symptoms worsen or if the condition fails to improve as anticipated.   I provided 25 minutes of non-face-to-face time during this encounter.   [name], LCSW   08/31/2023

## 2023-09-01 ENCOUNTER — Ambulatory Visit: Payer: Self-pay | Admitting: Family Medicine

## 2023-09-01 NOTE — Telephone Encounter (Signed)
  Chief Complaint: sore throat Symptoms: sore throat, cough, swollen lymph node Pertinent Negatives: Patient denies difficulty swallowing, fever, SOB Disposition: [] ED /[] Urgent Care (no appt availability in office) / [] Appointment(In office/virtual)/ []  [facility]/ [x] Home Care/ [] Refused Recommended Disposition /[] [facility]/ []  Follow-up with PCP Additional Notes: Patient reports she has had sore throat, cough, and a swollen lymph node on the right side since yesterday. Patient denies fever, sob, or difficulty swallowing. This RN recommended monitoring and symptoms management at home. This RN advised patient to call back if sob, difficulty swallowing, or worsening of symptoms occur. Patient verbalized understanding.      Copied from CRM [id number]. Topic: Clinical - Red Word Triage >> Sep 01, 2023 11:13 AM Almira Coaster wrote: Red Word that prompted transfer to Nurse Triage: Swollen lymph nodes   Reason for Disposition  [1] Sore throat with cough/cold symptoms AND [2] present < 5 days  Answer Assessment - Initial Assessment Questions 1. ONSET: "When did the throat start hurting?" (Hours or days ago)     yesterday 2. SEVERITY: "How bad is the sore throat?" (Scale 1-10; mild, moderate or severe)   - MILD (1-3):  Doesn't interfere with eating or normal activities.   - MODERATE (4-7): Interferes with eating some solids and normal activities.   - SEVERE (8-10):  Excruciating pain, interferes with most normal activities.   - SEVERE WITH DYSPHAGIA (10): Can't swallow liquids, drooling.     moderate 3. STREP EXPOSURE: "Has there been any exposure to strep within the past week?" If Yes, ask: "What type of contact occurred?"      no 4.  VIRAL SYMPTOMS: "Are there any symptoms of a cold, such as a runny nose, cough, hoarse voice or red eyes?"      Cough 5. FEVER: "Do you have a fever?" If Yes, ask: "What is your temperature, how was it measured, and when did it start?"      no 6. PUS ON THE TONSILS: "Is there pus on the tonsils in the back of your throat?"     no 7. OTHER SYMPTOMS: "Do you have any other symptoms?" (e.g., difficulty breathing, headache, rash)     Right sided lymph node swollen, sore throat, cough  Protocols used: Sore Throat-A-AH

## 2023-09-02 ENCOUNTER — Other Ambulatory Visit: Payer: Self-pay | Admitting: Family Medicine

## 2023-09-02 ENCOUNTER — Other Ambulatory Visit (HOSPITAL_COMMUNITY): Payer: Self-pay

## 2023-09-02 ENCOUNTER — Encounter: Payer: Self-pay | Admitting: Pharmacist

## 2023-09-02 ENCOUNTER — Other Ambulatory Visit: Payer: Self-pay

## 2023-09-02 DIAGNOSIS — B349 Viral infection, unspecified: Secondary | ICD-10-CM

## 2023-09-02 MED ORDER — PROMETHAZINE-DM 6.25-15 MG/5ML PO SYRP
5.0000 mL | ORAL_SOLUTION | Freq: Four times a day (QID) | ORAL | 0 refills | Status: DC | PRN
Start: 2023-09-02 — End: 2023-12-09
  Filled 2023-09-02 – 2023-10-14 (×2): qty 118, 6d supply, fill #0

## 2023-09-02 NOTE — Telephone Encounter (Signed)
A prescription for Promethazine DM has been sent to her pharmacy. I recommend warm saltwater gargles 3-4 times daily for sore throat, Tylenol as needed for pain relief, increased hydration, and plenty of rest. Additionally, using a humidifier is recommended to help with cough and congestion.

## 2023-09-07 ENCOUNTER — Other Ambulatory Visit: Payer: Self-pay

## 2023-09-07 ENCOUNTER — Other Ambulatory Visit: Payer: Self-pay | Admitting: Psychiatry

## 2023-09-07 ENCOUNTER — Other Ambulatory Visit (HOSPITAL_COMMUNITY): Payer: Self-pay

## 2023-09-07 ENCOUNTER — Other Ambulatory Visit: Payer: Self-pay | Admitting: Family Medicine

## 2023-09-07 DIAGNOSIS — F411 Generalized anxiety disorder: Secondary | ICD-10-CM

## 2023-09-07 DIAGNOSIS — E785 Hyperlipidemia, unspecified: Secondary | ICD-10-CM

## 2023-09-07 MED ORDER — LORATADINE 10 MG PO TABS
10.0000 mg | ORAL_TABLET | Freq: Every day | ORAL | 0 refills | Status: DC
  Filled 2023-09-07: qty 30, 30d supply, fill #0

## 2023-09-07 MED ORDER — DIVALPROEX SODIUM ER 500 MG PO TB24
1000.0000 mg | ORAL_TABLET | Freq: Every day | ORAL | 0 refills | Status: DC
  Filled 2023-09-07 – 2023-10-14 (×3): qty 180, 90d supply, fill #0

## 2023-09-07 MED ORDER — ROSUVASTATIN CALCIUM 10 MG PO TABS
10.0000 mg | ORAL_TABLET | Freq: Every day | ORAL | 1 refills | Status: DC
  Filled 2023-09-07 – 2023-10-14 (×2): qty 90, 90d supply, fill #0
  Filled 2023-12-26: qty 90, 90d supply, fill #1

## 2023-09-08 ENCOUNTER — Other Ambulatory Visit (HOSPITAL_COMMUNITY): Payer: Self-pay

## 2023-09-10 ENCOUNTER — Ambulatory Visit (INDEPENDENT_AMBULATORY_CARE_PROVIDER_SITE_OTHER): Payer: HMO | Admitting: Family Medicine

## 2023-09-10 ENCOUNTER — Encounter: Payer: Self-pay | Admitting: Family Medicine

## 2023-09-10 VITALS — BP 123/68 | HR 74 | Ht 64.0 in | Wt 154.0 lb

## 2023-09-10 DIAGNOSIS — E038 Other specified hypothyroidism: Secondary | ICD-10-CM

## 2023-09-10 DIAGNOSIS — E7849 Other hyperlipidemia: Secondary | ICD-10-CM | POA: Diagnosis not present

## 2023-09-10 DIAGNOSIS — Z23 Encounter for immunization: Secondary | ICD-10-CM | POA: Insufficient documentation

## 2023-09-10 DIAGNOSIS — E559 Vitamin D deficiency, unspecified: Secondary | ICD-10-CM

## 2023-09-10 DIAGNOSIS — R630 Anorexia: Secondary | ICD-10-CM | POA: Insufficient documentation

## 2023-09-10 DIAGNOSIS — R7301 Impaired fasting glucose: Secondary | ICD-10-CM

## 2023-09-10 DIAGNOSIS — F5105 Insomnia due to other mental disorder: Secondary | ICD-10-CM | POA: Diagnosis not present

## 2023-09-10 NOTE — Assessment & Plan Note (Signed)
Encouraged to continue taking rosuvastatin 10 mg daily  Pending lipid panel

## 2023-09-10 NOTE — Patient Instructions (Addendum)
I appreciate the opportunity to provide care to you today!    Follow up:  4 months  Labs: please stop by the lab today to get your blood drawn (CBC, CMP, TSH, Lipid profile, HgA1c, Vit D)  To improve your appetite I recommend recommend high-protein and high-calorie diet   Encourage high-calorie and high-protein diet Recommended eating  5 to 6 small meals a day, instead of 3 large meals Encouraged to keep snacks around that are easy to eat. Try to eat often, even if you don't feel hungry Encouraged to drink high-protein nutritional supplement drinks (sample brand names: Ensure, Boost, Carnation Instant Breakfast   What foods give me extra calories or protein?  Eat these foods or mix them with other foods to increase calories and protein.  ?Grains - Add butter, cream cheese, or nut butter to bread, crackers, or pancakes. Mix pasta or quinoa with meats or vegetables. Sprinkle granola on yogurts and hot cereals. Choose croissants, muffins, and biscuits.  ?Fruits - Mix fresh, frozen, or dried fruit into cereal or smoothies. Drink 100 percent fruit juice. Eat fresh, frozen, or canned fruit packed in its own juice.  ?Vegetables - Add cheese, butter, or sauces to vegetables. Put avocado in sandwiches. Eat bean dip, guacamole, or hummus with chips.  ?Dairy - Use full-fat milk or milk products. Add cheese to sandwiches and casseroles. Add [place] yogurt, heavy cream, or whipping cream to smoothies and shakes. Put sour cream or butter in other foods. Eat ice cream, custard, pudding, and cottage cheese.  ?Meats, poultry, seafood, and proteins - Add meats or eggs to salads, casseroles, and vegetables. Use gravies and sauces. Snack on nuts and nut butter.  ?Other foods or drinks - Drink high-protein nutritional supplement drinks (sample brand names: Ensure, Boost, [employer]).  Some tips to help you increase calories and protein in your diet:  ?Eat 5 to 6 small meals a day, instead  of 3 large meals. You can also choose high-calorie snacks and drinks between meals.  ?Keep snacks around that are easy to eat. Try to eat often, even if you don't feel hungry.  ?Add butter, olive oil, pesto, nuts, sauces, gravy, powdered milk, protein powder, or cream to your foods. This gives them extra calories and protein.  ?Drink 100 percent fruit juice, milkshakes, and smoothies instead of water. Try to drink at the end of meals so you don't fill up too soon.  ?Add syrup, jams and jellies, honey, or brown sugar to other foods. Snack on protein bars.   Referrals today-GI for loss of appetite  Attached with your AVS, you will find valuable resources for self-education. I highly recommend dedicating some time to thoroughly examine them.   Please continue to a heart-healthy diet and increase your physical activities. Try to exercise for at least five days a week.    It was a pleasure to see you and I look forward to continuing to work together on your health and well-being. Please do not hesitate to call the office if you need care or have questions about your care.  In case of emergency, please visit the Emergency Department for urgent care, or contact our clinic at [phone] to schedule an appointment. We're here to help you!   Have a wonderful day and week. With Gratitude, Gilmore Laroche MSN, FNP-BC

## 2023-09-10 NOTE — Assessment & Plan Note (Signed)
Reviewed Non-Pharmacological Management for Sleep Hygiene:  Establish a Consistent Bedtime Routine: -Develop and adhere to a regular sleep and wake schedule. -Avoid using electronic devices, including computers and smartphones, at least one hour before bedtime. -If unable to fall asleep within 15 minutes, refrain from staying in bed and engage in a relaxing activity until you feel sleepy. Reduce Daily Stress: -Engage in stress-reducing activities before bedtime to help relax your mind and body. Avoid intense physical exercise and stimulant use, such as caffeine, late in the day. Optimize Sleep Environment: -Use the bed and bedroom exclusively for sleep and intimate activities. -Consider removing electronic devices from the sleeping area and limit screen time prior to bedtime. Incorporate Relaxation Techniques: -Practice abdominal breathing and meditation to promote relaxation. Utilize progressive muscle relaxation and visualization techniques to aid in achieving restful sleep.

## 2023-09-10 NOTE — Assessment & Plan Note (Addendum)
To help improve your appetite, I recommend a high-protein, high-calorie diet.  Dietary Recommendations: Encourage a high-calorie, high-protein diet to support weight gain and improve nutrition. Eat 5 to 6 small meals a day instead of 3 large meals. This can help you eat more frequently and avoid feeling overly full. Keep easy-to-eat snacks around and try to eat often, even if you don't feel hungry. Drink high-protein nutritional supplement drinks (e.g., Ensure, Boost, Carnation Instant Breakfast) to add extra calories and protein to your diet. Foods to Increase Calories and Protein: Here are some specific food recommendations and tips to boost your calorie and protein intake:  Grains: Add butter, cream cheese, or nut butter to bread, crackers, or pancakes. Mix pasta or quinoa with meats or vegetables. Sprinkle granola on yogurt or hot cereals. Choose calorie-dense options like croissants, muffins, and biscuits.  Fruits: Mix fresh, frozen, or dried fruit into cereal or smoothies. Drink 100% fruit juice. Eat canned fruit packed in its own juice or frozen fruit.  Vegetables: Add cheese, butter, or sauces to vegetables. Include avocado in sandwiches or salads. Snack on bean dip, guacamole, or hummus with chips.  Dairy: Use full-fat milk or milk products. Add cheese to sandwiches, casseroles, and soups. Include [place] yogurt, heavy cream, or whipping cream in smoothies and shakes. Add sour cream or butter to other dishes. Enjoy ice cream, custard, pudding, and cottage cheese.  Meats, Poultry, Seafood, and Proteins: Add meats or eggs to salads, casseroles, and vegetables. Use gravies and sauces for added calories. Snack on nuts and nut butters.  Other Foods/Drinks: Drink high-protein nutritional supplement drinks (e.g., Ensure, Boost, Carnation Instant Breakfast) to supplement your diet with additional protein and calories.  Additional Tips: Eat 5 to 6 small meals a day, and consider  high-calorie snacks and drinks between meals. Keep snacks nearby that are easy to eat and try to eat frequently, even if you don't feel hungry. Add butter, olive oil, pesto, nuts, sauces, gravies, powdered milk, protein powder, or cream to your foods to increase calories and protein. Drink 100% fruit juice, milkshakes, or smoothies instead of water. Try to drink at the end of meals to avoid filling up too quickly. Add syrup, jams, jellies, honey, or brown sugar to foods for extra calories. Snack on protein bars for a quick, high-calorie option.

## 2023-09-10 NOTE — Assessment & Plan Note (Signed)
Patient educated on CDC recommendation for the vaccine. Verbal consent was obtained from the patient, vaccine administered by nurse, no sign of adverse reactions noted at this time. Patient education on arm soreness and use of tylenol or ibuprofen for this patient  was discussed. Patient educated on the signs and symptoms of adverse effect and advise to contact the office if they occur.  

## 2023-09-10 NOTE — Progress Notes (Signed)
Established Patient Office Visit  Subjective:  Patient ID: Stacy Chambers, female    DOB: 1966-05-04  Age: 56 y.o. MRN: [id number]  CC:  Chief Complaint  Patient presents with   Care Management    3 month f/u, pt reports not eating ok, would like white blood cell count checked.     HPI Stacy Chambers is a 57 y.o. female with past medical history of hyperlipidemia, bipolar 1, insomnia presents for f/u of  chronic medical conditions.  Anorexia:The patient has ongoing complaints of loss of appetite for the last two months. She reports minimal oral intake, noting that she barely eats one meal a day. She has experienced weight loss, likely due to minimal fluid intake. Although she has been educated on a high-protein, high-calorie diet, the patient reports reluctance to eat due to a fear of gaining weight and continued loss of appetite. She denies fever, chills, abdominal pain, fatigue, or changes in the skin, as well as muscle or bone pain.  Hyperlipidemia:The patient is compliant with rosuvastatin 10 mg daily and denies experiencing any muscle aches or pain.  Insomnia:The patient reports taking 30 mg of melatonin at night, which helps her fall asleep.   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   Chronic obstructive pulmonary disease (HCC) 10/03/2015   COPD (chronic obstructive pulmonary disease) (HCC)    no inhalers   Elevated liver function tests 11/26/2004   H/O NEGATIVE LIVER BIOPSY, workup negative   Gallstones 02/21/2015   GERD (gastroesophageal  reflux disease)    Hyperlipidemia    Insomnia    Lower back pain    Osteoarthritis    Ovarian cancer (HCC) 1995   RLS (restless legs syndrome)    Sedative, hypnotic or anxiolytic dependence with withdrawal, 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HCC)    Wears dentures    full upper and lower    Past Surgical History:  Procedure Laterality Date   ABDOMINAL HYSTERECTOMY     APPENDECTOMY     BREAST BIOPSY Left 03/12/2015   fat necrosis   CESAREAN SECTION     COLONOSCOPY WITH PROPOFOL N/A 05/30/2018   Procedure: COLONOSCOPY WITH PROPOFOL;  Surgeon: Scot Jun, MD;  Location[facility] END[facility]COPY;  Service: Endoscopy;  Laterality: N/A;   EXCISION MORTON'S NEUROMA Left 10/07/2016   Procedure: EXCISION MORTON'S NEUROMA;  Surgeon: Gwyneth Revels, DPM;  Location: [facility];  Service: Podiatry;  Laterality: Left;  IV WITH LOCAL   LIVER BIOPSY  2005   PerPt: Done to eval elevated LFTs and biopsy provided no definite dx/cause of elevated LFTs   MULTIPLE TOOTH EXTRACTIONS     OOPHORECTOMY     OSTECTOMY Right 04/10/2020   Procedure: DOUBLE OSTEOTOMY GENERAL W/ LOCAL;  Surgeon: Gwyneth Revels, DPM;  Location: [facility];  Service: Podiatry;  Laterality: Right;    Family History  Adopted: Yes  Problem Relation Age of Onset   COPD Mother    Depression Mother  Colon cancer Mother        unsure of age   Cancer Father        Lung CA, Skin Cancer--?type   COPD Father    Stroke Father    Cancer Sister 77       cervical cancer   COPD Brother    Alcohol abuse Brother    Stroke Brother    COPD Maternal Aunt    Depression Daughter    Schizophrenia Son    Breast cancer Neg Hx     Social History   Socioeconomic History   Marital status: Widowed    Spouse name: Not on file   Number of children: Not on file   Years of education: Not on file   Highest education level: 10th grade  Occupational History   Not on file  Tobacco Use   Smoking status: Former    Current packs/day: 0.00    Average  packs/day: 1 pack/day for 30.0 years (30.0 ttl pk-yrs)    Types: Cigarettes    Start date: 04/09/1979    Quit date: 04/08/2009    Years since quitting: 14.4   Smokeless tobacco: Never  Vaping Use   Vaping status: Never Used  Substance and Sexual Activity   Alcohol use: Not Currently    Alcohol/week: 3.0 - 7.0 standard drinks of alcohol    Types: 3 - 7 Shots of liquor per week    Comment: occasionally   Drug use: Not Currently    Types: Marijuana    Comment: told to refrain until after surgery   Sexual activity: Yes    Birth control/protection: Surgical  Other Topics Concern   Not on file  Social History Narrative   Not on file   Social Determinants of Health   Financial Resource Strain: Low Risk  (06/29/2023)   Overall Financial Resource Strain (CARDIA)    Difficulty of Paying Living Expenses: Not hard at all  Food Insecurity: Food Insecurity Present (07/07/2023)   Hunger Vital Sign    Worried About Running Out of Food in the Last Year: Never true    Ran Out of Food in the Last Year: Often true  Transportation Needs: No Transportation Needs (07/07/2023)   PRAPARE - Administrator, Civil Service (Medical): No    Lack of Transportation (Non-Medical): No  Physical Activity: Inactive (06/29/2023)   Exercise Vital Sign    Days of Exercise per Week: 0 days    Minutes of Exercise per Session: 0 min  Stress: No Stress Concern Present (06/29/2023)   [employer] of Occupational Health - Occupational Stress Questionnaire    Feeling of Stress : Not at all  Social Connections: Patient Declined (06/29/2023)   Social Connection and Isolation Panel [NHANES]    Frequency of Communication with Friends and Family: Patient declined    Frequency of Social Gatherings with Friends and Family: Patient declined    Attends Religious Services: Patient declined    Database administrator or Organizations: Patient declined    Attends Banker Meetings: Patient declined    Marital  Status: Patient declined  Intimate Partner Violence: Not At Risk (06/29/2023)   Humiliation, Afraid, Rape, and Kick questionnaire    Fear of Current or Ex-Partner: No    Emotionally Abused: No    Physically Abused: No    Sexually Abused: No    Outpatient Medications Prior to Visit  Medication Sig Dispense Refill   clonazePAM (KLONOPIN) 0.5 MG tablet Take 1 tablet (  0.5 mg total) by mouth 2 to 3  times a week as directed for severe anxiety attacks. 6 tablet 1   divalproex (DEPAKOTE ER) 250 MG 24 hr tablet Take 1 tablet (250 mg total) by mouth daily. Take along with 1000 mg daily , total of 1250 mg 90 tablet 1   divalproex (DEPAKOTE ER) 500 MG 24 hr tablet Take 2 tablets (1,000 mg total) by mouth daily. Take along with 250 mg daily - total of 1250 mg 180 tablet 0   escitalopram (LEXAPRO) 20 MG tablet Take 1 tablet (20 mg total) by mouth daily. 90 tablet 0   esomeprazole (NEXIUM) 20 MG capsule Take 1 capsule (20 mg total) by mouth daily as needed (take on empty stomach). 90 capsule 1   fluticasone (FLONASE) 50 MCG/ACT nasal spray Place 2 sprays into both nostrils daily. 16 g 2   loratadine (CLARITIN) 10 MG tablet Take 1 tablet (10 mg total) by mouth daily. 30 tablet 0   methocarbamol (ROBAXIN) 500 MG tablet TAKE 1 TABLET EVERY DAY 60 tablet 3   pantoprazole (PROTONIX) 20 MG tablet Take 1 tablet (20 mg total) by mouth daily. 30 tablet 1   promethazine-dextromethorphan (PROMETHAZINE-DM) 6.25-15 MG/5ML syrup Take 5 mLs by mouth 4 (four) times daily as needed. 118 mL 0   rosuvastatin (CRESTOR) 10 MG tablet Take 1 tablet (10 mg total) by mouth daily. 90 tablet 1   traZODone (DESYREL) 100 MG tablet Take 1-2 tablets (100-200 mg total) by mouth at bedtime as needed, for sleep. 180 tablet 1   Vitamin D, Ergocalciferol, (DRISDOL) 1.25 MG (50000 UNIT) CAPS capsule Take 1 capsule (50,000 Units total) by mouth every 7 (seven) days. 20 capsule 3   ketoconazole (NIZORAL) 2 % shampoo Use as directed topically 2  (two) times a week. 120 mL 0   meloxicam (MOBIC) 15 MG tablet Take 1 tablet (15 mg total) by mouth daily. 90 tablet 1   Na Sulfate-K Sulfate-Mg Sulf 17.5-3.13-1.6 GM/177ML SOLN Take as directed 354 mL 0   No facility-administered medications prior to visit.    Allergies  Allergen Reactions   Benadryl [Diphenhydramine Hcl]     Hives/ rapid heartrate   Codeine     dizziness   Vicodin [Hydrocodone-Acetaminophen] Other (See Comments)    dizziness    ROS Review of Systems  Constitutional:  Negative for chills, fatigue and fever.  Eyes:  Negative for visual disturbance.  Respiratory:  Negative for chest tightness and shortness of breath.   Gastrointestinal:  Negative for constipation, diarrhea, nausea and vomiting.  Neurological:  Negative for dizziness and headaches.      Objective:    Physical Exam HENT:     Head: Normocephalic.     Mouth/Throat:     Mouth: Mucous membranes are moist.  Cardiovascular:     Rate and Rhythm: Normal rate.     Heart sounds: Normal heart sounds.  Pulmonary:     Effort: Pulmonary effort is normal.     Breath sounds: Normal breath sounds.  Neurological:     Mental Status: She is alert.     BP 123/68   Pulse 74   Ht 5\' 4"  (1.626 m)   Wt 154 lb 0.6 oz (69.9 kg)   SpO2 98%   BMI 26.44 kg/m  Wt Readings from Last 3 Encounters:  09/10/23 154 lb 0.6 oz (69.9 kg)  06/29/23 169 lb (76.7 kg)  06/10/23 165 lb (74.8 kg)    Lab Results  Component Value Date  TSH 1.700 10/30/2022   Lab Results  Component Value Date   WBC 7.0 10/30/2022   HGB 12.2 10/30/2022   HCT 35.5 10/30/2022   MCV 90 10/30/2022   PLT 181 10/30/2022   Lab Results  Component Value Date   NA 138 10/30/2022   K 4.0 10/30/2022   CO2 25 10/30/2022   GLUCOSE 93 10/30/2022   BUN 16 10/30/2022   CREATININE 0.67 10/30/2022   BILITOT 0.3 10/30/2022   ALKPHOS 77 10/30/2022   AST 18 10/30/2022   ALT 16 10/30/2022   PROT 7.4 10/30/2022   ALBUMIN 4.9 10/30/2022    CALCIUM 10.2 10/30/2022   EGFR 102 10/30/2022   Lab Results  Component Value Date   CHOL 172 10/30/2022   Lab Results  Component Value Date   HDL 55 10/30/2022   Lab Results  Component Value Date   LDLCALC 83 10/30/2022   Lab Results  Component Value Date   TRIG 205 (H) 10/30/2022   Lab Results  Component Value Date   CHOLHDL 3.1 10/30/2022   Lab Results  Component Value Date   HGBA1C 5.4 10/30/2022      Assessment & Plan:  Anorexia Assessment & Plan: To help improve your appetite, I recommend a high-protein, high-calorie diet.  Dietary Recommendations: Encourage a high-calorie, high-protein diet to support weight gain and improve nutrition. Eat 5 to 6 small meals a day instead of 3 large meals. This can help you eat more frequently and avoid feeling overly full. Keep easy-to-eat snacks around and try to eat often, even if you don't feel hungry. Drink high-protein nutritional supplement drinks (e.g., Ensure, Boost, Carnation Instant Breakfast) to add extra calories and protein to your diet. Foods to Increase Calories and Protein: Here are some specific food recommendations and tips to boost your calorie and protein intake:  Grains: Add butter, cream cheese, or nut butter to bread, crackers, or pancakes. Mix pasta or quinoa with meats or vegetables. Sprinkle granola on yogurt or hot cereals. Choose calorie-dense options like croissants, muffins, and biscuits.  Fruits: Mix fresh, frozen, or dried fruit into cereal or smoothies. Drink 100% fruit juice. Eat canned fruit packed in its own juice or frozen fruit.  Vegetables: Add cheese, butter, or sauces to vegetables. Include avocado in sandwiches or salads. Snack on bean dip, guacamole, or hummus with chips.  Dairy: Use full-fat milk or milk products. Add cheese to sandwiches, casseroles, and soups. Include [place] yogurt, heavy cream, or whipping cream in smoothies and shakes. Add sour cream or butter to other dishes.  Enjoy ice cream, custard, pudding, and cottage cheese.  Meats, Poultry, Seafood, and Proteins: Add meats or eggs to salads, casseroles, and vegetables. Use gravies and sauces for added calories. Snack on nuts and nut butters.  Other Foods/Drinks: Drink high-protein nutritional supplement drinks (e.g., Ensure, Boost, Carnation Instant Breakfast) to supplement your diet with additional protein and calories.  Additional Tips: Eat 5 to 6 small meals a day, and consider high-calorie snacks and drinks between meals. Keep snacks nearby that are easy to eat and try to eat frequently, even if you don't feel hungry. Add butter, olive oil, pesto, nuts, sauces, gravies, powdered milk, protein powder, or cream to your foods to increase calories and protein. Drink 100% fruit juice, milkshakes, or smoothies instead of water. Try to drink at the end of meals to avoid filling up too quickly. Add syrup, jams, jellies, honey, or brown sugar to foods for extra calories. Snack on protein bars for a  quick, high-calorie option.   Orders: -     Ambulatory referral to Gastroenterology  Encounter for immunization Assessment & Plan: Patient educated on CDC recommendation for the vaccine. Verbal consent was obtained from the patient, vaccine administered by nurse, no sign of adverse reactions noted at this time. Patient education on arm soreness and use of tylenol or ibuprofen for this patient  was discussed. Patient educated on the signs and symptoms of adverse effect and advise to contact the office if they occur.   Orders: -     Flu vaccine trivalent PF, 6mos and older(Flulaval,Afluria,Fluarix,Fluzone)  Insomnia due to mental disorder Assessment & Plan: Reviewed Non-Pharmacological Management for Sleep Hygiene:  Establish a Consistent Bedtime Routine: -Develop and adhere to a regular sleep and wake schedule. -Avoid using electronic devices, including computers and smartphones, at least one hour before  bedtime. -If unable to fall asleep within 15 minutes, refrain from staying in bed and engage in a relaxing activity until you feel sleepy. Reduce Daily Stress: -Engage in stress-reducing activities before bedtime to help relax your mind and body. Avoid intense physical exercise and stimulant use, such as caffeine, late in the day. Optimize Sleep Environment: -Use the bed and bedroom exclusively for sleep and intimate activities. -Consider removing electronic devices from the sleeping area and limit screen time prior to bedtime. Incorporate Relaxation Techniques: -Practice abdominal breathing and meditation to promote relaxation. Utilize progressive muscle relaxation and visualization techniques to aid in achieving restful sleep.    IFG (impaired fasting glucose) -     Hemoglobin A1c  Other hyperlipidemia -     Lipid panel -     CMP14+EGFR -     CBC with Differential/Platelet  Vitamin D deficiency -     VITAMIN D 25 Hydroxy (Vit-D Deficiency, Fractures)  TSH (thyroid-stimulating hormone deficiency) -     TSH + free T4   Note: This chart has been completed using Engineer, civil (consulting) software, and while attempts have been made to ensure accuracy, certain words and phrases may not be transcribed as intended.   Follow-up: Return in about 4 months (around 01/08/2024).   Gilmore Laroche, FNP

## 2023-09-11 LAB — CMP14+EGFR
ALT: 19 [IU]/L (ref 0–32)
AST: 27 [IU]/L (ref 0–40)
Albumin: 4.9 g/dL (ref 3.8–4.9)
Alkaline Phosphatase: 89 [IU]/L (ref 44–121)
BUN/Creatinine Ratio: 13 (ref 9–23)
BUN: 10 mg/dL (ref 6–24)
Bilirubin Total: 0.4 mg/dL (ref 0.0–1.2)
CO2: 23 mmol/L (ref 20–29)
Calcium: 9.8 mg/dL (ref 8.7–10.2)
Chloride: 97 mmol/L (ref 96–106)
Creatinine, Ser: 0.75 mg/dL (ref 0.57–1.00)
Globulin, Total: 2.1 g/dL (ref 1.5–4.5)
Glucose: 93 mg/dL (ref 70–99)
Potassium: 4 mmol/L (ref 3.5–5.2)
Sodium: 138 mmol/L (ref 134–144)
Total Protein: 7 g/dL (ref 6.0–8.5)
eGFR: 92 mL/min/{1.73_m2} (ref >59)

## 2023-09-11 LAB — CBC WITH DIFFERENTIAL/PLATELET
Basophils Absolute: 0 10*3/uL (ref 0.0–0.2)
Basos: 1 %
EOS (ABSOLUTE): 0.1 10*3/uL (ref 0.0–0.4)
Eos: 1 %
Hematocrit: 37.7 % (ref 34.0–46.6)
Hemoglobin: 12.6 g/dL (ref 11.1–15.9)
Immature Grans (Abs): 0 10*3/uL (ref 0.0–0.1)
Immature Granulocytes: 0 %
Lymphocytes Absolute: 2.5 10*3/uL (ref 0.7–3.1)
Lymphs: 37 %
MCH: 31.2 pg (ref 26.6–33.0)
MCHC: 33.4 g/dL (ref 31.5–35.7)
MCV: 93 fL (ref 79–97)
Monocytes Absolute: 0.4 10*3/uL (ref 0.1–0.9)
Monocytes: 6 %
Neutrophils Absolute: 3.6 10*3/uL (ref 1.4–7.0)
Neutrophils: 55 %
Platelets: 178 10*3/uL (ref 150–450)
RBC: 4.04 x10E6/uL (ref 3.77–5.28)
RDW: 12.5 % (ref 11.7–15.4)
WBC: 6.6 10*3/uL (ref 3.4–10.8)

## 2023-09-11 LAB — LIPID PANEL
Chol/HDL Ratio: 3.4 ratio (ref 0.0–4.4)
Cholesterol, Total: 138 mg/dL (ref 100–199)
HDL: 41 mg/dL (ref >39)
LDL Chol Calc (NIH): 71 mg/dL (ref 0–99)
Triglycerides: 147 mg/dL (ref 0–149)
VLDL Cholesterol Cal: 26 mg/dL (ref 5–40)

## 2023-09-11 LAB — TSH+FREE T4
Free T4: 1.19 ng/dL (ref 0.82–1.77)
TSH: 1.25 u[IU]/mL (ref 0.450–4.500)

## 2023-09-11 LAB — VITAMIN D 25 HYDROXY (VIT D DEFICIENCY, FRACTURES): Vit D, 25-Hydroxy: 104 ng/mL — ABNORMAL HIGH (ref 30.0–100.0)

## 2023-09-11 LAB — HEMOGLOBIN A1C
Est. average glucose Bld gHb Est-mCnc: 103 mg/dL
Hgb A1c MFr Bld: 5.2 % (ref 4.8–5.6)

## 2023-09-13 ENCOUNTER — Other Ambulatory Visit: Payer: Self-pay

## 2023-09-15 ENCOUNTER — Encounter (HOSPITAL_COMMUNITY): Payer: Self-pay

## 2023-09-15 ENCOUNTER — Encounter (HOSPITAL_COMMUNITY)
Admission: RE | Admit: 2023-09-15 | Discharge: 2023-09-15 | Disposition: A | Discharge status: Home / Self Care | Payer: HMO | Source: Ambulatory Visit | Attending: Internal Medicine | Admitting: Internal Medicine

## 2023-09-16 DIAGNOSIS — M9903 Segmental and somatic dysfunction of lumbar region: Secondary | ICD-10-CM | POA: Diagnosis not present

## 2023-09-16 DIAGNOSIS — M546 Pain in thoracic spine: Secondary | ICD-10-CM | POA: Diagnosis not present

## 2023-09-16 DIAGNOSIS — M62830 Muscle spasm of back: Secondary | ICD-10-CM | POA: Diagnosis not present

## 2023-09-16 DIAGNOSIS — M9902 Segmental and somatic dysfunction of thoracic region: Secondary | ICD-10-CM | POA: Diagnosis not present

## 2023-09-20 ENCOUNTER — Ambulatory Visit (HOSPITAL_COMMUNITY)
Admission: RE | Admit: 2023-09-20 | Discharge: 2023-09-20 | Disposition: A | Discharge status: Home / Self Care | Payer: HMO | Source: Ambulatory Visit | Attending: Internal Medicine | Admitting: Internal Medicine

## 2023-09-20 ENCOUNTER — Encounter (HOSPITAL_COMMUNITY)
Admission: RE | Disposition: A | Discharge status: Home / Self Care | Payer: Self-pay | Source: Ambulatory Visit | Attending: Internal Medicine

## 2023-09-20 ENCOUNTER — Encounter (HOSPITAL_COMMUNITY): Payer: Self-pay

## 2023-09-20 ENCOUNTER — Ambulatory Visit (HOSPITAL_COMMUNITY): Payer: HMO | Admitting: Anesthesiology

## 2023-09-20 DIAGNOSIS — K5730 Diverticulosis of large intestine without perforation or abscess without bleeding: Secondary | ICD-10-CM | POA: Diagnosis not present

## 2023-09-20 DIAGNOSIS — Z87891 Personal history of nicotine dependence: Secondary | ICD-10-CM | POA: Diagnosis not present

## 2023-09-20 DIAGNOSIS — K219 Gastro-esophageal reflux disease without esophagitis: Secondary | ICD-10-CM | POA: Diagnosis not present

## 2023-09-20 DIAGNOSIS — K648 Other hemorrhoids: Secondary | ICD-10-CM | POA: Diagnosis not present

## 2023-09-20 DIAGNOSIS — I209 Angina pectoris, unspecified: Secondary | ICD-10-CM | POA: Diagnosis not present

## 2023-09-20 DIAGNOSIS — Z1211 Encounter for screening for malignant neoplasm of colon: Secondary | ICD-10-CM

## 2023-09-20 DIAGNOSIS — Z800 Family history of malignant neoplasm of digestive organs: Secondary | ICD-10-CM

## 2023-09-20 DIAGNOSIS — J449 Chronic obstructive pulmonary disease, unspecified: Secondary | ICD-10-CM | POA: Diagnosis not present

## 2023-09-20 DIAGNOSIS — F419 Anxiety disorder, unspecified: Secondary | ICD-10-CM | POA: Diagnosis not present

## 2023-09-20 DIAGNOSIS — F319 Bipolar disorder, unspecified: Secondary | ICD-10-CM | POA: Diagnosis not present

## 2023-09-20 HISTORY — PX: COLONOSCOPY WITH PROPOFOL: SHX5780

## 2023-09-20 SURGERY — COLONOSCOPY WITH PROPOFOL
Anesthesia: General

## 2023-09-20 MED ORDER — PROPOFOL 500 MG/50ML IV EMUL
INTRAVENOUS | Status: DC | PRN
  Administered 2023-09-20: 200 ug/kg/min via INTRAVENOUS

## 2023-09-20 MED ORDER — PROPOFOL 10 MG/ML IV BOLUS
INTRAVENOUS | Status: DC | PRN
  Administered 2023-09-20: 100 mg via INTRAVENOUS

## 2023-09-20 MED ORDER — LACTATED RINGERS IV SOLN: INTRAVENOUS | Status: DC | PRN

## 2023-09-20 NOTE — Transfer of Care (Signed)
Immediate Anesthesia Transfer of Care Note  Patient: Stacy Chambers  Procedure(s) Performed: COLONOSCOPY WITH PROPOFOL  Patient Location: Short Stay  Anesthesia Type:General  Level of Consciousness: awake, alert , oriented, and patient cooperative  Airway & Oxygen Therapy: Patient Spontanous Breathing  Post-op Assessment: Report given to RN, Post -op Vital signs reviewed and stable, and Patient moving all extremities X 4  Post vital signs: Reviewed and stable  Last Vitals:  Vitals Value Taken Time  BP 149/85 09/20/23 1007  Temp 36.4 C 09/20/23 1007  Pulse 53 09/20/23 1007  Resp 11 09/20/23 1007  SpO2 100 % 09/20/23 1007    Last Pain:  Vitals:   09/20/23 1007  TempSrc: Axillary  PainSc: 1          Complications: No notable events documented.

## 2023-09-20 NOTE — Anesthesia Postprocedure Evaluation (Signed)
Anesthesia Post Note  Patient: Stacy Chambers  Procedure(s) Performed: COLONOSCOPY WITH PROPOFOL  Patient location during evaluation: PACU Anesthesia Type: General Level of consciousness: awake and alert Pain management: pain level controlled Vital Signs Assessment: post-procedure vital signs reviewed and stable Respiratory status: spontaneous breathing, nonlabored ventilation, respiratory function stable and patient connected to nasal cannula oxygen Cardiovascular status: blood pressure returned to baseline and stable Postop Assessment: no apparent nausea or vomiting Anesthetic complications: no   There were no known notable events for this encounter.   Last Vitals:  Vitals:   09/20/23 0822 09/20/23 1007  BP: 100/68 (!) 149/85  Pulse: 61 (!) 53  Resp: 16 11  Temp: 36.7 C 36.4 C  SpO2: 97% 100%    Last Pain:  Vitals:   09/20/23 1007  TempSrc: Axillary  PainSc: 1                  Mckensie Scotti L Shaquan Puerta

## 2023-09-20 NOTE — H&P (Signed)
Primary Care Physician:  Gilmore Laroche, FNP Primary Gastroenterologist:  Dr. Marletta Lor  Pre-Procedure History & Physical: HPI:  Stacy Chambers is a 57 y.o. 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   Chronic obstructive pulmonary disease (HCC) 10/03/2015   COPD (chronic obstructive pulmonary disease) (HCC)    no inhalers   Elevated liver function tests 11/26/2004   H/O NEGATIVE LIVER BIOPSY, workup negative   Gallstones 02/21/2015   GERD (gastroesophageal reflux disease)    Hyperlipidemia    Insomnia    Lower back pain    Osteoarthritis    Ovarian cancer (HCC) 1995   RLS (restless legs syndrome)    Sedative, hypnotic or anxiolytic dependence with withdrawal, 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HCC)    Wears dentures    full upper and lower    Past Surgical History:  Procedure Laterality Date   ABDOMINAL HYSTERECTOMY     APPENDECTOMY     BREAST BIOPSY Left 03/12/2015   fat necrosis   CESAREAN SECTION     COLONOSCOPY WITH PROPOFOL N/A 05/30/2018   Procedure: COLONOSCOPY WITH PROPOFOL;  Surgeon: Scot Jun, MD;  Location: [facility] ENDOSCOPY;  Service: Endoscopy;  Laterality: N/A;   EXCISION MORTON'S NEUROMA Left 10/07/2016   Procedure: EXCISION MORTON'S NEUROMA;  Surgeon: Gwyneth Revels, DPM;  Location: [facility];  Service: Podiatry;  Laterality: Left;  IV WITH LOCAL   LIVER BIOPSY  2005   PerPt: Done to eval elevated LFTs and biopsy provided no definite dx/cause of elevated LFTs   MULTIPLE TOOTH EXTRACTIONS     OOPHORECTOMY     OSTECTOMY Right 04/10/2020    Procedure: DOUBLE OSTEOTOMY GENERAL W/ LOCAL;  Surgeon: Gwyneth Revels, DPM;  Location: [facility];  Service: Podiatry;  Laterality: Right;    Prior to Admission medications   Medication Sig Start Date End Date Taking? Authorizing Provider  clonazePAM (KLONOPIN) 0.5 MG tablet Take 1 tablet (0.5 mg total) by mouth 2 to 3  times a week as directed for severe anxiety attacks. 01/18/23  Yes Jomarie Longs, MD  divalproex (DEPAKOTE ER) 250 MG 24 hr tablet Take 1 tablet (250 mg total) by mouth daily. Take along with 1000 mg daily , total of 1250 mg 06/10/23  Yes Eappen, Levin Bacon, MD  divalproex (DEPAKOTE ER) 500 MG 24 hr tablet Take 2 tablets (1,000 mg total) by mouth daily. Take along with 250 mg daily - total of 1250 mg 09/07/23  Yes Eappen, Saramma, MD  escitalopram (LEXAPRO) 20 MG tablet Take 1 tablet (20 mg total) by mouth daily. 07/01/23  Yes Jomarie Longs, MD  esomeprazole (NEXIUM) 20 MG capsule Take 1 capsule (20 mg total) by mouth daily as needed (take on empty stomach). 01/31/19  Yes Danelle Berry, PA-C  fluticasone (FLONASE) 50 MCG/ACT nasal spray Place 2 sprays into both nostrils daily. 05/11/23  Yes Gilmore Laroche, FNP  ketoconazole (NIZORAL) 2 % shampoo Use as directed topically 2 (two) times a week. 07/01/23  Yes Gilmore Laroche, FNP  loratadine (CLARITIN) 10  MG tablet Take 1 tablet (10 mg total) by mouth daily. 09/07/23  Yes Gilmore Laroche, FNP  meloxicam (MOBIC) 15 MG tablet Take 1 tablet (15 mg total) by mouth daily. 05/11/23  Yes Gilmore Laroche, FNP  methocarbamol (ROBAXIN) 500 MG tablet TAKE 1 TABLET EVERY DAY 09/28/22  Yes Gilmore Laroche, FNP  Na Sulfate-K Sulfate-Mg Sulf 17.5-3.13-1.6 GM/177ML SOLN Take as directed 07/28/23  Yes Cristian Grieves K, DO  pantoprazole (PROTONIX) 20 MG tablet Take 1 tablet (20 mg total) by mouth daily. 08/16/23  Yes Gilmore Laroche, FNP  promethazine-dextromethorphan (PROMETHAZINE-DM) 6.25-15 MG/5ML syrup Take 5 mLs by mouth 4 (four) times daily as  needed. 09/02/23  Yes Gilmore Laroche, FNP  rosuvastatin (CRESTOR) 10 MG tablet Take 1 tablet (10 mg total) by mouth daily. 09/07/23 03/05/24 Yes Gilmore Laroche, FNP  traZODone (DESYREL) 100 MG tablet Take 1-2 tablets (100-200 mg total) by mouth at bedtime as needed, for sleep. 07/29/23  Yes Jomarie Longs, MD  Vitamin D, Ergocalciferol, (DRISDOL) 1.25 MG (50000 UNIT) CAPS capsule Take 1 capsule (50,000 Units total) by mouth every 7 (seven) days. 05/11/23  Yes Gilmore Laroche, FNP    Allergies as of 07/28/2023 - Review Complete 07/01/2023  Allergen Reaction Noted   Benadryl [diphenhydramine hcl]  04/15/2011   Codeine  05/27/2018   Vicodin [hydrocodone-acetaminophen] Other (See Comments) 09/20/2013    Family History  Adopted: Yes  Problem Relation Age of Onset   COPD Mother    Depression Mother    Colon cancer Mother        unsure of age   Cancer Father        Lung CA, Skin Cancer--?type   COPD Father    Stroke Father    Cancer Sister 20       cervical cancer   COPD Brother    Alcohol abuse Brother    Stroke Brother    COPD Maternal Aunt    Depression Daughter    Schizophrenia Son    Breast cancer Neg Hx     Social History   Socioeconomic History   Marital status: Widowed    Spouse name: Not on file   Number of children: Not on file   Years of education: Not on file   Highest education level: 10th grade  Occupational History   Not on file  Tobacco Use   Smoking status: Former    Current packs/day: 0.00    Average packs/day: 1 pack/day for 30.0 years (30.0 ttl pk-yrs)    Types: Cigarettes    Start date: 04/09/1979    Quit date: 04/08/2009    Years since quitting: 14.4   Smokeless tobacco: Never  Vaping Use   Vaping status: Every Day  Substance and Sexual Activity   Alcohol use: Not Currently    Alcohol/week: 3.0 - 7.0 standard drinks of alcohol    Types: 3 - 7 Shots of liquor per week    Comment: occasionally   Drug use: Not Currently    Types: Marijuana     Comment: told to refrain until after surgery   Sexual activity: Yes    Birth control/protection: Surgical  Other Topics Concern   Not on file  Social History Narrative   Not on file   Social Determinants of Health   Financial Resource Strain: Low Risk  (06/29/2023)   Overall Financial Resource Strain (CARDIA)    Difficulty of Paying Living Expenses: Not hard at all  Food Insecurity: Food Insecurity Present (07/07/2023)   Hunger Vital Sign  Worried About Programme researcher, broadcasting/film/video in the Last Year: Never true    Ran Out of Food in the Last Year: Often true  Transportation Needs: No Transportation Needs (07/07/2023)   PRAPARE - Administrator, Civil Service (Medical): No    Lack of Transportation (Non-Medical): No  Physical Activity: Inactive (06/29/2023)   Exercise Vital Sign    Days of Exercise per Week: 0 days    Minutes of Exercise per Session: 0 min  Stress: No Stress Concern Present (06/29/2023)   [employer] of Occupational Health - Occupational Stress Questionnaire    Feeling of Stress : Not at all  Social Connections: Patient Declined (06/29/2023)   Social Connection and Isolation Panel [NHANES]    Frequency of Communication with Friends and Family: Patient declined    Frequency of Social Gatherings with Friends and Family: Patient declined    Attends Religious Services: Patient declined    Active Member of Clubs or Organizations: Patient declined    Attends Banker Meetings: Patient declined    Marital Status: Patient declined  Intimate Partner Violence: Not At Risk (06/29/2023)   Humiliation, Afraid, Rape, and Kick questionnaire    Fear of Current or Ex-Partner: No    Emotionally Abused: No    Physically Abused: No    Sexually Abused: No    Review of Systems: See HPI, otherwise negative ROS  Physical Exam: Vital signs in last 24 hours: Temp:  [98 F (36.7 C)] 98 F (36.7 C) (11/25 0822) Pulse Rate:  [61] 61 (11/25 0822) Resp:  [16] 16 (11/25  0822) BP: (100)/(68) 100/68 (11/25 0822) SpO2:  [97 %] 97 % (11/25 0822) Weight:  [69.8 kg] 69.8 kg (11/25 0800)   General:   Alert,  Well-developed, well-nourished, pleasant and cooperative in NAD Head:  Normocephalic and atraumatic. Eyes:  Sclera clear, no icterus.   Conjunctiva pink. Ears:  Normal auditory acuity. Nose:  No deformity, discharge,  or lesions. Mouth:  No deformity or lesions, dentition normal. Neck:  Supple; no masses or thyromegaly. Lungs:  Clear throughout to auscultation.   No wheezes, crackles, or rhonchi. No acute distress. Heart:  Regular rate and rhythm; no murmurs, clicks, rubs,  or gallops. Abdomen:  Soft, nontender and nondistended. No masses, hepatosplenomegaly or hernias noted. Normal bowel sounds, without guarding, and without rebound.   Msk:  Symmetrical without gross deformities. Normal posture. Extremities:  Without clubbing or edema. Neurologic:  Alert and  oriented x4;  grossly normal neurologically. Skin:  Intact without significant lesions or rashes. Cervical Nodes:  No significant cervical adenopathy. Psych:  Alert and cooperative. Normal mood and affect.  Impression/Plan: Stacy Chambers is here for a colonoscopy to be performed for high risk colon cancer screening purposes, family history of colon cancer in mother  The risks of the procedure including infection, bleed, or perforation as well as benefits, limitations, alternatives and imponderables have been reviewed with the patient. Questions have been answered. All parties agreeable.

## 2023-09-20 NOTE — Anesthesia Preprocedure Evaluation (Signed)
Anesthesia Evaluation  Patient identified by MRN, date of birth, ID band Patient awake    Reviewed: Allergy & Precautions, H&P , NPO status , Patient's Chart, lab work & pertinent test results  Airway Mallampati: I  TM Distance: >3 FB Neck ROM: full    Dental  (+) Edentulous Lower, Edentulous Upper   Pulmonary COPD, former smoker   Pulmonary exam normal breath sounds clear to auscultation       Cardiovascular + angina  Normal cardiovascular exam Rhythm:regular Rate:Normal     Neuro/Psych  PSYCHIATRIC DISORDERS Anxiety  Bipolar Disorder      GI/Hepatic ,GERD  ,,  Endo/Other    Renal/GU      Musculoskeletal   Abdominal   Peds  Hematology   Anesthesia Other Findings   Reproductive/Obstetrics                             Anesthesia Physical Anesthesia Plan  ASA: 3  Anesthesia Plan: General LMA   Post-op Pain Management: Minimal or no pain anticipated   Induction: Intravenous  PONV Risk Score and Plan: Propofol infusion  Airway Management Planned: Nasal Cannula and Natural Airway  Additional Equipment: None  Intra-op Plan:   Post-operative Plan:   Informed Consent: I have reviewed the patients History and Physical, chart, labs and discussed the procedure including the risks, benefits and alternatives for the proposed anesthesia with the patient or authorized representative who has indicated his/her understanding and acceptance.     Dental Advisory Given  Plan Discussed with: CRNA  Anesthesia Plan Comments:         Anesthesia Quick Evaluation

## 2023-09-20 NOTE — Op Note (Signed)
[facility] Patient Name: Stacy Chambers Procedure Date: 09/20/2023 9:33 AM MRN: [id number] Date of Birth: [birth year] Attending MD: Hennie Duos. Marletta Lor , [place], [id number] CSN: [id number] Age: 57 Admit Type: Outpatient Procedure:                Colonoscopy Indications:              Colon cancer screening in patient at increased                            risk: Colorectal cancer in mother Providers:                Hennie Duos. Marletta Lor, DO, Angelica Ran, Lennice Sites                            Technician, Technician Referring MD:              Medicines:                See the Anesthesia note for documentation of the                            administered medications Complications:            No immediate complications. Estimated Blood Loss:     Estimated blood loss was minimal. Procedure:                Pre-Anesthesia Assessment:                           - The anesthesia plan was to use monitored                            anesthesia care (MAC).                           After obtaining informed consent, the colonoscope                            was passed under direct vision. Throughout the                            procedure, the patient's blood pressure, pulse, and                            oxygen saturations were monitored continuously. The                            [id number]-HQ190L ([id number]) scope was introduced through                            the anus and advanced to the the cecum, identified                            by appendiceal orifice and ileocecal valve. The                            colonoscopy was performed without  difficulty. The                            patient tolerated the procedure well. The quality                            of the bowel preparation was evaluated using the                            BBPS [facility] Bowel Preparation Scale) with scores                            of: Right Colon = 2 (minor amount of residual                            staining, small  fragments of stool and/or opaque                            liquid, but mucosa seen well), Transverse Colon = 2                            (minor amount of residual staining, small fragments                            of stool and/or opaque liquid, but mucosa seen                            well) and Left Colon = 3 (entire mucosa seen well                            with no residual staining, small fragments of stool                            or opaque liquid). The total BBPS score equals 7.                            The quality of the bowel preparation was good. Scope In: 9:46:24 AM Scope Out: 10:02:39 AM Scope Withdrawal Time: 0 hours 11 minutes 32 seconds  Total Procedure Duration: 0 hours 16 minutes 15 seconds  Findings:      Non-bleeding internal hemorrhoids were found during endoscopy.      A few small-mouthed diverticula were found in the sigmoid colon.      A moderate amount of semi-liquid stool was found in the entire colon,       interfering with visualization. Lavage of the area was performed using       copious amounts of sterile water, resulting in clearance with adequate       visualization. Impression:               - Non-bleeding internal hemorrhoids.                           - Diverticulosis in the sigmoid colon.                           -  Stool in the entire examined colon.                           - No specimens collected. Moderate Sedation:      Per Anesthesia Care Recommendation:           - Patient has a contact number available for                            emergencies. The signs and symptoms of potential                            delayed complications were discussed with the                            patient. Return to normal activities tomorrow.                            Written discharge instructions were provided to the                            patient.                           - Resume previous diet.                           - Continue present  medications.                           - Repeat colonoscopy in 5 years for screening                            purposes (family history of colon cancer in mother)                           - Return to GI clinic PRN. Procedure Code(s):        --- Professional ---                           E9528, Colorectal cancer screening; colonoscopy on                            individual at high risk Diagnosis Code(s):        --- Professional ---                           Z80.0, Family history of malignant neoplasm of                            digestive organs                           K64.8, Other hemorrhoids                           K57.30, Diverticulosis of large intestine without  perforation or abscess without bleeding CPT copyright 2022 American Medical Association. All rights reserved. The codes documented in this report are preliminary and upon coder review may  be revised to meet current compliance requirements. Hennie Duos. Marletta Lor, DO Hennie Duos. Marletta Lor, DO 09/20/2023 10:07:48 AM This report has been signed electronically. Number of Addenda: 0

## 2023-09-20 NOTE — Discharge Instructions (Addendum)
Colonoscopy Discharge Instructions  Read the instructions outlined below and refer to this sheet in the next few weeks. These discharge instructions provide you with general information on caring for yourself after you leave the hospital. Your doctor may also give you specific instructions. While your treatment has been planned according to the most current medical practices available, unavoidable complications occasionally occur.   ACTIVITY You may resume your regular activity, but move at a slower pace for the next 24 hours.  Take frequent rest periods for the next 24 hours.  Walking will help get rid of the air and reduce the bloated feeling in your belly (abdomen).  No driving for 24 hours (because of the medicine (anesthesia) used during the test).   Do not sign any important legal documents or operate any machinery for 24 hours (because of the anesthesia used during the test).  NUTRITION Drink plenty of fluids.  You may resume your normal diet as instructed by your doctor.  Begin with a light meal and progress to your normal diet. Heavy or fried foods are harder to digest and may make you feel sick to your stomach (nauseated).  Avoid alcoholic beverages for 24 hours or as instructed.  MEDICATIONS You may resume your normal medications unless your doctor tells you otherwise.  WHAT YOU CAN EXPECT TODAY Some feelings of bloating in the abdomen.  Passage of more gas than usual.  Spotting of blood in your stool or on the toilet paper.  IF YOU HAD POLYPS REMOVED DURING THE COLONOSCOPY: No aspirin products for 7 days or as instructed.  No alcohol for 7 days or as instructed.  Eat a soft diet for the next 24 hours.  FINDING OUT THE RESULTS OF YOUR TEST Not all test results are available during your visit. If your test results are not back during the visit, make an appointment with your caregiver to find out the results. Do not assume everything is normal if you have not heard from your  caregiver or the medical facility. It is important for you to follow up on all of your test results.  SEEK IMMEDIATE MEDICAL ATTENTION IF: You have more than a spotting of blood in your stool.  Your belly is swollen (abdominal distention).  You are nauseated or vomiting.  You have a temperature over 101.  You have abdominal pain or discomfort that is severe or gets worse throughout the day.   Your colonoscopy was relatively unremarkable.  I did not find any polyps or evidence of colon cancer.  I recommend repeating colonoscopy in 5 years for colon cancer screening purposes given your family history of colon cancer.    You do have diverticulosis and internal hemorrhoids. I would recommend increasing fiber in your diet or adding OTC Benefiber/Metamucil. Be sure to drink at least 4 to 6 glasses of water daily. Follow-up with GI as needed.   I hope you have a great rest of your week!  Hennie Duos. Marletta Lor, D.O. Gastroenterology and Hepatology [facility]

## 2023-09-21 NOTE — Addendum Note (Signed)
Addendum  created 09/21/23 1307 by Shanon Payor, CRNA   Intraprocedure Event edited, Intraprocedure Staff edited

## 2023-09-28 ENCOUNTER — Encounter (HOSPITAL_COMMUNITY): Payer: Self-pay | Admitting: Internal Medicine

## 2023-10-03 ENCOUNTER — Other Ambulatory Visit: Payer: Self-pay | Admitting: Psychiatry

## 2023-10-03 ENCOUNTER — Other Ambulatory Visit: Payer: Self-pay | Admitting: Family Medicine

## 2023-10-03 DIAGNOSIS — G8929 Other chronic pain: Secondary | ICD-10-CM

## 2023-10-03 DIAGNOSIS — F411 Generalized anxiety disorder: Secondary | ICD-10-CM

## 2023-10-03 DIAGNOSIS — F3178 Bipolar disorder, in full remission, most recent episode mixed: Secondary | ICD-10-CM

## 2023-10-04 ENCOUNTER — Other Ambulatory Visit (HOSPITAL_COMMUNITY): Payer: Self-pay

## 2023-10-04 ENCOUNTER — Other Ambulatory Visit: Payer: Self-pay

## 2023-10-04 MED ORDER — MELOXICAM 15 MG PO TABS
15.0000 mg | ORAL_TABLET | Freq: Every day | ORAL | 1 refills | Status: DC
  Filled 2023-10-04 – 2023-12-26 (×3): qty 90, 90d supply, fill #0
  Filled 2024-02-02 – 2024-03-25 (×3): qty 90, 90d supply, fill #1

## 2023-10-04 MED ORDER — ESCITALOPRAM OXALATE 20 MG PO TABS
20.0000 mg | ORAL_TABLET | Freq: Every day | ORAL | 0 refills | Status: DC
  Filled 2023-10-04: qty 90, 90d supply, fill #0

## 2023-10-05 ENCOUNTER — Ambulatory Visit (HOSPITAL_COMMUNITY): Payer: HMO | Admitting: Clinical

## 2023-10-05 DIAGNOSIS — F411 Generalized anxiety disorder: Secondary | ICD-10-CM | POA: Diagnosis not present

## 2023-10-05 DIAGNOSIS — F3131 Bipolar disorder, current episode depressed, mild: Secondary | ICD-10-CM | POA: Diagnosis not present

## 2023-10-05 DIAGNOSIS — G4700 Insomnia, unspecified: Secondary | ICD-10-CM

## 2023-10-05 DIAGNOSIS — R41840 Attention and concentration deficit: Secondary | ICD-10-CM | POA: Diagnosis not present

## 2023-10-05 DIAGNOSIS — F5105 Insomnia due to other mental disorder: Secondary | ICD-10-CM

## 2023-10-05 NOTE — Progress Notes (Signed)
Virtual Visit via Video Note   I connected with Stacy Chambers on 10/05/23 at  1:00 PM EST by a video enabled telemedicine application and verified that I am speaking with the correct person using two identifiers.   Location: Patient: Home Provider: Office   I discussed the limitations of evaluation and management by telemedicine and the availability of in person appointments. The patient expressed understanding and agreed to proceed.     THERAPIST PROGRESS NOTE   Session Time: 1:00 PM-1:45 PM   Participation Level: Active   Behavioral Response: CasualAlertDepressed   Type of Therapy: Individual Therapy   Treatment Goals addressed: Coping   Interventions: CBT   Summary: Stacy Chambers is a 57 y.o. female who presents with Bipolar Disorder./ GAD/Insomnia. The OPT therapist worked with the patient for her ongoing OPT treatment session. The OPT therapist utilized Motivational Interviewing to assist in creating therapeutic repore. The patient in the session was engaged and work in collaboration giving feedback about her triggers and symptoms over the past few weeks.The patient spoke ongoing development in her relationship and planning for getting married next year in the Spring. The patient spoke about her focus on looking forward and her future and this helping her with move forward from her grief around her late husband. The patient spoke about the recent Thanksgiving holiday. The OPT therapist utilized Cognitive Behavioral Therapy through cognitive restructuring as well as worked with the patient on coping strategies to assist in management of mood. The OPT therapist worked with the patient on continuing to maintain her basic needs of self care with eating, hygiene, and sleep. The patient spoke about ongoing work to regulate her sleep cycle and intent to talk with her PCP about sleep aid. The OPT therapist worked with the patient in taking advantage of change of environment . The patient  spoke about plans for upcoming Christmas holiday and spending time with family.   Suicidal/Homicidal: Nowithout intent/plan   Therapist Response: The OPT therapist worked with the patient for the patients scheduled session. The patient was engaged in her session and gave feedback in relation to triggers, symptoms, and behavior responses over the past few weeks. The OPT therapist worked with the patient utilizing an in session Cognitive Behavioral Therapy exercise. 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 Every year.. I have been busy decorating for Christmas and staying in out of the cold weather". The OPT therapist worked with the patient on staying active , managing her basic health needs, and being consistent with her medication therapy plan. The OPT therapist worked with the patient gauging activity level, coping skill implementation, and mood. The patient spoke about enjoying decorating for the holidays. The patient spoke about recovery from recent Colonoscopy. The OPT therapist will continue treatment work with the patient in her next scheduled session    Plan: Return again in 3 weeks.   Diagnosis:      Axis I: Bipolar 1 disorder,depressed mild/GAD/Insomnia/ Attention deficit.                             Axis II: No diagnosis    Collaboration of Care: Collaboration of care reviewed with medication therapy involvement with psychiatrist Dr. Elna Breslow.   Patient/Guardian was advised Release of Information must be obtained prior to any record release in order to collaborate their care with an outside provider. Patient/Guardian was advised if they have not already done  so to contact the registration department to sign all necessary forms in order for [name] to release information regarding their care.    Consent: Patient/Guardian gives verbal consent for treatment and assignment of benefits for services provided during this visit. Patient/Guardian  expressed understanding and agreed to proceed.        I discussed the assessment and treatment plan with the patient. The patient was provided an opportunity to ask questions and all were answered. The patient agreed with the plan and demonstrated an understanding of the instructions.   The patient was advised to call back or seek an in-person evaluation if the symptoms worsen or if the condition fails to improve as anticipated.   I provided 45 minutes of non-face-to-face time during this encounter.   [name], LCSW   10/05/2023

## 2023-10-14 ENCOUNTER — Other Ambulatory Visit: Payer: Self-pay

## 2023-10-14 ENCOUNTER — Other Ambulatory Visit: Payer: Self-pay | Admitting: Psychiatry

## 2023-10-14 ENCOUNTER — Other Ambulatory Visit (HOSPITAL_COMMUNITY): Payer: Self-pay

## 2023-10-14 ENCOUNTER — Other Ambulatory Visit: Payer: Self-pay | Admitting: Family Medicine

## 2023-10-14 DIAGNOSIS — G8929 Other chronic pain: Secondary | ICD-10-CM

## 2023-10-14 DIAGNOSIS — G2581 Restless legs syndrome: Secondary | ICD-10-CM

## 2023-10-14 MED ORDER — METHOCARBAMOL 500 MG PO TABS
500.0000 mg | ORAL_TABLET | Freq: Every day | ORAL | 3 refills | Status: DC
  Filled 2023-10-14: qty 60, 60d supply, fill #0
  Filled 2023-11-11 – 2023-12-26 (×2): qty 60, 60d supply, fill #1
  Filled 2024-02-21: qty 60, 60d supply, fill #2
  Filled 2024-02-22: qty 60, 60d supply, fill #3

## 2023-10-14 MED ORDER — ESOMEPRAZOLE MAGNESIUM 20 MG PO CPDR
20.0000 mg | DELAYED_RELEASE_CAPSULE | Freq: Every day | ORAL | 1 refills | Status: DC | PRN
  Filled 2023-10-14: qty 90, 90d supply, fill #0
  Filled 2023-11-11 – 2023-12-26 (×2): qty 90, 90d supply, fill #1

## 2023-10-15 ENCOUNTER — Telehealth: Payer: Self-pay | Admitting: Psychiatry

## 2023-10-15 ENCOUNTER — Other Ambulatory Visit: Payer: Self-pay

## 2023-10-15 NOTE — Telephone Encounter (Signed)
Copied from CRM [id number]. Topic: Clinical - Medication Refill >> Oct 14, 2023  3:40 PM Shelah Lewandowsky wrote: Most Recent Primary Care Visit:  Provider: Gilmore Laroche  Department: [facility]  Visit Type: OFFICE VISIT  Date: 09/10/2023  Medication: ammonium lactate (AMLACTIN) 12  Has the patient contacted their pharmacy? No (Agent: If no, request that the patient contact the pharmacy for the refill. If patient does not wish to contact the pharmacy document the reason why and proceed with request.) (Agent: If yes, when and what did the pharmacy advise?)  Is this the correct pharmacy for this prescription? No If no, delete pharmacy and type the correct one.  This is the patient's preferred pharmacy:  [facility] [address] [place] [address] Phone: [phone] Fax: [phone]   Has the prescription been filled recently? No  Is the patient out of the medication? No  Has the patient been seen for an appointment in the last year OR does the patient have an upcoming appointment? Yes  Can we respond through MyChart? Yes  Agent: Please be advised that Rx refills may take up to 3 business days. We ask that you follow-up with your pharmacy.

## 2023-10-15 NOTE — Telephone Encounter (Signed)
Received a refill request for clonazepam.  It was discussed with patient previously that clonazepam will not be prescribed due to her comorbid use of cannabis.  Patient continues to smoke cannabis and is currently not a good candidate for treatment with benzodiazepines.  Patient will need a urine drug screen and it needs to be reviewed before considering it in the future.

## 2023-10-18 NOTE — Telephone Encounter (Signed)
Called patient as instructed by provider to discuss the clonazepam refill being denied due to cannabis use and advised patient that she would need to submit a urine drug screen to be reviewed before considering it in the future patient voiced understanding

## 2023-10-22 ENCOUNTER — Other Ambulatory Visit: Payer: Self-pay | Admitting: Family Medicine

## 2023-10-22 ENCOUNTER — Other Ambulatory Visit (HOSPITAL_COMMUNITY): Payer: Self-pay

## 2023-10-22 ENCOUNTER — Other Ambulatory Visit: Payer: Self-pay

## 2023-10-22 DIAGNOSIS — R1084 Generalized abdominal pain: Secondary | ICD-10-CM

## 2023-10-22 MED ORDER — PANTOPRAZOLE SODIUM 20 MG PO TBEC
20.0000 mg | DELAYED_RELEASE_TABLET | Freq: Every day | ORAL | 1 refills | Status: DC
  Filled 2023-10-22 – 2023-11-11 (×2): qty 30, 30d supply, fill #0
  Filled 2023-12-26: qty 30, 30d supply, fill #1

## 2023-11-03 ENCOUNTER — Ambulatory Visit: Payer: HMO | Admitting: Psychiatry

## 2023-11-11 ENCOUNTER — Other Ambulatory Visit: Payer: Self-pay | Admitting: Family Medicine

## 2023-11-11 ENCOUNTER — Other Ambulatory Visit: Payer: Self-pay

## 2023-11-11 ENCOUNTER — Other Ambulatory Visit: Payer: Self-pay | Admitting: Psychiatry

## 2023-11-11 ENCOUNTER — Other Ambulatory Visit (HOSPITAL_COMMUNITY): Payer: Self-pay

## 2023-11-11 DIAGNOSIS — F3162 Bipolar disorder, current episode mixed, moderate: Secondary | ICD-10-CM

## 2023-11-11 MED ORDER — LORATADINE 10 MG PO TABS
10.0000 mg | ORAL_TABLET | Freq: Every day | ORAL | 0 refills | Status: DC
  Filled 2023-11-11: qty 30, 30d supply, fill #0

## 2023-11-12 ENCOUNTER — Other Ambulatory Visit (HOSPITAL_COMMUNITY): Payer: Self-pay

## 2023-11-12 ENCOUNTER — Other Ambulatory Visit: Payer: Self-pay

## 2023-11-12 ENCOUNTER — Telehealth: Payer: Self-pay | Admitting: Psychiatry

## 2023-11-12 DIAGNOSIS — F411 Generalized anxiety disorder: Secondary | ICD-10-CM

## 2023-11-12 MED ORDER — DIVALPROEX SODIUM ER 500 MG PO TB24
1000.0000 mg | ORAL_TABLET | Freq: Every day | ORAL | 0 refills | Status: DC
  Filled 2023-11-12 – 2023-12-26 (×2): qty 180, 90d supply, fill #0

## 2023-11-12 MED ORDER — DIVALPROEX SODIUM ER 250 MG PO TB24
250.0000 mg | ORAL_TABLET | Freq: Every day | ORAL | 1 refills | Status: DC
  Filled 2023-11-12 – 2023-12-26 (×2): qty 90, 90d supply, fill #0
  Filled 2024-03-25: qty 90, 90d supply, fill #1

## 2023-11-12 NOTE — Telephone Encounter (Signed)
I have sent Depakote to pharmacy. 

## 2023-11-16 ENCOUNTER — Ambulatory Visit (HOSPITAL_COMMUNITY): Payer: HMO | Admitting: Clinical

## 2023-11-16 DIAGNOSIS — F3131 Bipolar disorder, current episode depressed, mild: Secondary | ICD-10-CM

## 2023-11-16 DIAGNOSIS — F411 Generalized anxiety disorder: Secondary | ICD-10-CM | POA: Diagnosis not present

## 2023-11-16 DIAGNOSIS — R41840 Attention and concentration deficit: Secondary | ICD-10-CM

## 2023-11-16 DIAGNOSIS — F5105 Insomnia due to other mental disorder: Secondary | ICD-10-CM

## 2023-11-16 NOTE — Progress Notes (Signed)
Virtual Visit via Video Note   I connected with Stacy Chambers on 11/16/23 at  1:00 PM EST by a video enabled telemedicine application and verified that I am speaking with the correct person using two identifiers.   Location: Patient: Home Provider: Office   I discussed the limitations of evaluation and management by telemedicine and the availability of in person appointments. The patient expressed understanding and agreed to proceed.     THERAPIST PROGRESS NOTE   Session Time: 1:00 PM-1:25 PM   Participation Level: Active   Behavioral Response: CasualAlertDepressed   Type of Therapy: Individual Therapy   Treatment Goals addressed: Coping   Interventions: CBT   Summary: Stacy Chambers is a 58 y.o. female who presents with Bipolar Disorder./ GAD/Insomnia. The OPT therapist worked with the patient for her ongoing OPT treatment session. The OPT therapist utilized Motivational Interviewing to assist in creating therapeutic repore. The patient in the session was engaged and work in collaboration giving feedback about her triggers and symptoms over the past few weeks.The patient spoke difficulty with her sleep cycle due to trying to stay up when her partner is home and awake and this has thrown her sleep cycle off. The patient spoke about caregiving for her animals through the Winter season The patient noted one of her puppies is getting ready to have puppies. The OPT therapist utilized Cognitive Behavioral Therapy through cognitive restructuring as well as worked with the patient on coping strategies to assist in management of mood. The OPT therapist worked with the patient on continuing to maintain her basic needs of self care with eating, hygiene, and sleep. The patient spoke about ongoing work to regulate her sleep cycle and intent to talk with her PCP about help to get her sleep cycle regulated, however, noted she feels her medication for Bipolar is working well.    Suicidal/Homicidal:  Nowithout intent/plan   Therapist Response: The OPT therapist worked with the patient for the patients scheduled session. The patient was engaged in her session and gave feedback in relation to triggers, symptoms, and behavior responses over the past few weeks. The OPT therapist worked with the patient utilizing an in session Cognitive Behavioral Therapy exercise. The patient was responsive in the session and verbalized," I will stay up sometimes for 2 days at a time, I do think its because I try to stay up when my fiance is home awake". The OPT therapist worked with the patient on staying active , managing her basic health needs, and being consistent with her medication therapy plan. The patient spoke about the extreme cold in the area and staying inside out of the cold. The patient spoke about being excited for her upcoming marriage this coming Valentines Day and going on her honeymoon later in the Spring to the beach.  The OPT therapist overviewed with the patinet upcoming appointments as listed in patients MyChart including upcoming med management appointment with Dr. Elna Breslow 11/2023 The OPT therapist will continue treatment work with the patient in her next scheduled session    Plan: Return again in 3 weeks.   Diagnosis:      Axis I: Bipolar 1 disorder,depressed mild/GAD/Insomnia/ Attention deficit.                             Axis II: No diagnosis    Collaboration of Care: Collaboration of care reviewed with medication therapy involvement with psychiatrist Dr. Elna Breslow.   Patient/Guardian was advised  Release of Information must be obtained prior to any record release in order to collaborate their care with an outside provider. Patient/Guardian was advised if they have not already done so to contact the registration department to sign all necessary forms in order for [name] to release information regarding their care.    Consent: Patient/Guardian gives verbal consent for treatment and assignment of  benefits for services provided during this visit. Patient/Guardian expressed understanding and agreed to proceed.        I discussed the assessment and treatment plan with the patient. The patient was provided an opportunity to ask questions and all were answered. The patient agreed with the plan and demonstrated an understanding of the instructions.   The patient was advised to call back or seek an in-person evaluation if the symptoms worsen or if the condition fails to improve as anticipated.   I provided 25 minutes of non-face-to-face time during this encounter.   [name], LCSW   11/16/2023

## 2023-12-09 ENCOUNTER — Other Ambulatory Visit (HOSPITAL_COMMUNITY): Payer: Self-pay

## 2023-12-09 ENCOUNTER — Encounter: Payer: Self-pay | Admitting: Psychiatry

## 2023-12-09 ENCOUNTER — Other Ambulatory Visit: Payer: Self-pay

## 2023-12-09 ENCOUNTER — Telehealth: Payer: HMO | Admitting: Psychiatry

## 2023-12-09 DIAGNOSIS — F3131 Bipolar disorder, current episode depressed, mild: Secondary | ICD-10-CM | POA: Diagnosis not present

## 2023-12-09 DIAGNOSIS — Z79899 Other long term (current) drug therapy: Secondary | ICD-10-CM | POA: Diagnosis not present

## 2023-12-09 DIAGNOSIS — G2581 Restless legs syndrome: Secondary | ICD-10-CM | POA: Diagnosis not present

## 2023-12-09 DIAGNOSIS — R5383 Other fatigue: Secondary | ICD-10-CM | POA: Insufficient documentation

## 2023-12-09 DIAGNOSIS — F411 Generalized anxiety disorder: Secondary | ICD-10-CM | POA: Diagnosis not present

## 2023-12-09 DIAGNOSIS — F5105 Insomnia due to other mental disorder: Secondary | ICD-10-CM

## 2023-12-09 MED ORDER — ROPINIROLE HCL 0.25 MG PO TABS
0.2500 mg | ORAL_TABLET | Freq: Every day | ORAL | 1 refills | Status: DC
  Filled 2023-12-09 – 2023-12-27 (×2): qty 30, 30d supply, fill #0
  Filled 2024-02-07 – 2024-02-21 (×2): qty 30, 30d supply, fill #1

## 2023-12-09 NOTE — Patient Instructions (Signed)
Ropinirole Tablets What is this medication? ROPINIROLE (roe PIN i role) treats the symptoms of Parkinson disease. It works by acting like dopamine, a substance in your body that helps manage movements and coordination. This reduces the symptoms of Parkinson, such as body stiffness and tremors. It may also be used to treat restless legs syndrome (RLS). This medicine may be used for other purposes; ask your health care provider or pharmacist if you have questions. COMMON BRAND NAME(S): Requip What should I tell my care team before I take this medication? They need to know if you have any of these conditions: Heart disease High blood pressure Kidney disease Liver disease Low blood pressure Narcolepsy Sleep apnea Tobacco use An unusual or allergic reaction to ropinirole, other medications, foods, dyes, or preservatives Pregnant or trying to get pregnant Breast-feeding How should I use this medication? Take this medication by mouth with water. Take it as directed on the prescription label. You can take it with or without food. If it upsets your stomach, take it with food. If it upsets your stomach, take it with food. Keep taking this medication unless your care team tells you to stop. Stopping it too quickly can cause serious side effects. It can also make your condition worse. Talk to your care team about the use of this medication in children. Special care may be needed. Overdosage: If you think you have taken too much of this medicine contact a poison control center or emergency room at once. NOTE: This medicine is only for you. Do not share this medicine with others. What if I miss a dose? If you miss a dose, take it as soon as you can. If it is almost time for your next dose, take only that dose. Do not take double or extra doses. What may interact with this medication? Alcohol Antihistamines for allergy, cough and cold Certain medications for depression, anxiety, or mental health  conditions Certain medications for seizures, such as phenobarbital, primidone Certain medications for sleep Ciprofloxacin Estrogen or progestin hormones Fluvoxamine General anesthetics, such as halothane, isoflurane, methoxyflurane, propofol Medications for blood pressure Medications that relax muscles for surgery Metoclopramide Opioid medications for pain Rifampin Tobacco This list may not describe all possible interactions. Give your health care provider a list of all the medicines, herbs, non-prescription drugs, or dietary supplements you use. Also tell them if you smoke, drink alcohol, or use illegal drugs. Some items may interact with your medicine. What should I watch for while using this medication? Visit your care team for regular checks on your progress. Tell your care team if your symptoms do not start to get better or if they get worse. Do not suddenly stop taking this medication. You may develop a severe reaction. Your care team will tell you how much medication to take. If your care team wants you to stop the medication, the dose may be slowly lowered over time to avoid any side effects. This medication may affect your coordination, reaction time, or judgement. Do not drive or operate machinery until you know how this medication affects you. Sit up or stand slowly to reduce the risk of dizzy or fainting spells. Drinking alcohol with this medication can increase the risk of these side effects. When taking this medication, you may fall asleep without notice. You may be doing activities, such as driving a car, talking, or eating. You may not feel drowsy before it happens. Contact your care team right away if this happens to you. There have been reports  of increased sexual urges or other strong urges, such as gambling while taking this medication. If you experience any of these while taking this medication, you should report this to your care team as soon as possible. Your mouth may get  dry. Chewing sugarless gum or sucking hard candy and drinking plenty of water may help. Contact your care team if the problem does not go away or is severe. What side effects may I notice from receiving this medication? Side effects that you should report to your care team as soon as possible: Allergic reactions--skin rash, itching, hives, swelling of the face, lips, tongue, or throat Falling asleep during daily activities Low blood pressure--dizziness, feeling faint or lightheaded, blurry vision Mood and behavior changes--anxiety, nervousness, irritability and restlessness, confusion, hallucinations, feeling distrust or suspicion of others New or worsening uncontrolled and repetitive movements of the face, mouth, or upper body Slow heartbeat--dizziness, feeling faint or lightheaded, confusion, trouble breathing, unusual weakness or fatigue Urges to engage in impulsive behaviors such as gambling, binge eating, sexual activity, or shopping in ways that are unusual for you Side effects that usually do not require medical attention (report to your care team if they continue or are bothersome): Dizziness Drowsiness Nausea Swelling of the ankles, hands, or feet Unusual weakness or fatigue Upset stomach Vomiting This list may not describe all possible side effects. Call your doctor for medical advice about side effects. You may report side effects to FDA at 1-800-FDA-1088. Where should I keep my medication? Keep out of the reach of children and pets. Store at room temperature between 20 and 25 degrees C (68 and 77 degrees F). Protect from light and moisture. Keep the container tightly closed. Get rid of any unused medication after the expiration date. To get rid of medications that are no longer needed or have expired: Take the medication to a take-back program. Check with your pharmacy or law enforcement to find a location. If you cannot return the medication, check the label or package insert to  see if the medication should be thrown out in the garbage or flushed down the toilet. If you are not sure, ask your care team. If it is safe to put it in the trash, empty the medication out of the container. Mix the medication with cat litter, dirt, coffee grounds, or other unwanted substance. Seal the mixture in a bag or container. Put it in the trash. NOTE: This sheet is a summary. It may not cover all possible information. If you have questions about this medicine, talk to your doctor, pharmacist, or health care provider.  2024 [employer]/Gold Standard (2022-01-21 00:00:00)

## 2023-12-09 NOTE — Progress Notes (Unsigned)
Virtual Visit via Video Note  I connected with Stacy Chambers on 12/09/23 at  2:30 PM EST by a video enabled telemedicine application and verified that I am speaking with the correct person using two identifiers.  Location Provider Location : [facility] Patient Location : Home  Participants: Patient , Provider   I discussed the limitations of evaluation and management by telemedicine and the availability of in person appointments. The patient expressed understanding and agreed to proceed.   I discussed the assessment and treatment plan with the patient. The patient was provided an opportunity to ask questions and all were answered. The patient agreed with the plan and demonstrated an understanding of the instructions.   The patient was advised to call back or seek an in-person evaluation if the symptoms worsen or if the condition fails to improve as anticipated.   BH MD OP Progress Note  12/10/2023 1:34 PM DAKODA BASSETTE  MRN:  [id number]  Chief Complaint:  Chief Complaint  Patient presents with   Follow-up   Anxiety   Depression   Medication Refill   HPI: Stacy Chambers is a 58 year old Caucasian female, lives in [place], has a history of bipolar disorder, GAD, insomnia, bereavement, RLS, MCA, vitamin B12 deficiency was evaluated by telemedicine today.  She has been experiencing mood instability, which she attributes to the recent death of her [place] during childbirth. This event has been emotionally distressing, and she notes that her other [place], TT, is not eating and appears heartbroken. She has increased appetite, which she associates with depression, and has not seen her granddaughter since Christmas due to her daughter's living situation, contributing to her low mood. She lives with her boyfriend, who works a rotating schedule, and they try to spend time together when possible. Denies thoughts of self-harm or harm to others.  She has a history of generalized anxiety  disorder. Although there are no specific acute anxiety symptoms reported during this visit, recent stressors may be contributing to her anxiety levels.  She reports difficulty falling asleep despite taking trazodone, which helps her stay asleep. She uses melatonin at a dose of 22 mg to aid in falling asleep. She experiences daytime tiredness but does not nap during the day.  She experiences restless leg symptoms, which were previously managed with benztropine when she was on Geodon. She is not currently on vitamin B12 supplementation, history of low vitamin B12 in 2022.  She mentions hair loss and has been experiencing an itchy scalp, which she believes is related to humidity. Her thyroid levels were checked recently and were normal.  She currently denies any suicidality, homicidality or perceptual disturbances.  Visit Diagnosis:    ICD-10-CM   1. Bipolar 1 disorder, depressed, mild (HCC)  F31.31     2. GAD (generalized anxiety disorder)  F41.1     3. Insomnia due to mental disorder  F51.05 rOPINIRole (REQUIP) 0.25 MG tablet   Anxiety,RLS    4. RLS (restless legs syndrome)  G25.81 rOPINIRole (REQUIP) 0.25 MG tablet    Ferritin    Iron and TIBC    5. Fatigue, unspecified type  R53.83 Vitamin B12    Ferritin    Iron and TIBC    6. High risk medication use  Z79.899 Valproic acid level      Past Psychiatric History: I have reviewed past psychiatric history from progress note on 10/12/2018.  Past trials of medications like Geodon, Rhinolar, Zyprexa, risperidone, BuSpar, Zoloft, Wellbutrin, Lamictal, carbamazepine.  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    Chronic obstructive pulmonary disease (HCC) 10/03/2015   COPD (chronic obstructive pulmonary disease) (HCC)    no inhalers   Elevated liver function tests 11/26/2004   H/O NEGATIVE LIVER BIOPSY, workup negative   Gallstones 02/21/2015   GERD (gastroesophageal reflux disease)    Hyperlipidemia    Insomnia    Lower back pain    Osteoarthritis    Ovarian cancer (HCC) 1995   RLS (restless legs syndrome)    Sedative, hypnotic or anxiolytic dependence with withdrawal, 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HCC)    Wears dentures    full upper and lower    Past Surgical History:  Procedure Laterality Date   ABDOMINAL HYSTERECTOMY     APPENDECTOMY     BREAST BIOPSY Left 03/12/2015   fat necrosis   CESAREAN SECTION     COLONOSCOPY WITH PROPOFOL N/A 05/30/2018   Procedure: COLONOSCOPY WITH PROPOFOL;  Surgeon: Scot Jun, MD;  Location: [facility] ENDOSCOPY;  Service: Endoscopy;  Laterality: N/A;   COLONOSCOPY WITH PROPOFOL N/A 09/20/2023   Procedure: COLONOSCOPY WITH PROPOFOL;  Surgeon: Lanelle Bal, DO;  [facility];  Service: Endoscopy;  Laterality: N/A;  930am, asa 3   EXCISION MORTON'S NEUROMA Left 10/07/2016   Procedure: EXCISION MORTON'S NEUROMA;  Surgeon: Gwyneth Revels, DPM;  Location: [facility];  Service: Podiatry;  Laterality: Left;  IV WITH LOCAL   LIVER BIOPSY  2005   PerPt: Done to eval elevated LFTs and biopsy provided no definite dx/cause of elevated LFTs   MULTIPLE TOOTH EXTRACTIONS     OOPHORECTOMY     OSTECTOMY Right 04/10/2020   Procedure: DOUBLE OSTEOTOMY GENERAL W/ LOCAL;  Surgeon: Gwyneth Revels, DPM;  Location: [facility];  Service: Podiatry;  Laterality: Right;    Family Psychiatric History: I have reviewed family psychiatric history from progress note on 10/12/2018.  Family History:  Family History  Adopted: Yes  Problem Relation Age of Onset   COPD Mother    Depression Mother    Colon cancer Mother        unsure of age   Cancer Father        Lung  CA, Skin Cancer--?type   COPD Father    Stroke Father    Cancer Sister 44       cervical cancer   COPD Brother    Alcohol abuse Brother    Stroke Brother    COPD Maternal Aunt    Depression Daughter    Schizophrenia Son    Breast cancer Neg Hx     Social History: I have reviewed social history from progress note on 10/12/2018. Social History   Socioeconomic History   Marital status: Widowed    Spouse name: Not on file   Number of children: Not on file   Years of education: Not on file   Highest education level: 10th grade  Occupational History   Not on file  Tobacco Use   Smoking status: Former    Current packs/day: 0.00    Average packs/day: 1 pack/day for 30.0 years (30.0 ttl pk-yrs)  Types: Cigarettes    Start date: 04/09/1979    Quit date: 04/08/2009    Years since quitting: 14.6   Smokeless tobacco: Never  Vaping Use   Vaping status: Every Day  Substance and Sexual Activity   Alcohol use: Not Currently    Alcohol/week: 3.0 - 7.0 standard drinks of alcohol    Types: 3 - 7 Shots of liquor per week    Comment: occasionally   Drug use: Not Currently    Types: Marijuana    Comment: told to refrain until after surgery   Sexual activity: Yes    Birth control/protection: Surgical  Other Topics Concern   Not on file  Social History Narrative   Not on file   Social Drivers of Health   Financial Resource Strain: Low Risk  (06/29/2023)   Overall Financial Resource Strain (CARDIA)    Difficulty of Paying Living Expenses: Not hard at all  Food Insecurity: Food Insecurity Present (07/07/2023)   Hunger Vital Sign    Worried About Running Out of Food in the Last Year: Never true    Ran Out of Food in the Last Year: Often true  Transportation Needs: No Transportation Needs (07/07/2023)   PRAPARE - Administrator, Civil Service (Medical): No    Lack of Transportation (Non-Medical): No  Physical Activity: Inactive (06/29/2023)   Exercise Vital Sign    Days of  Exercise per Week: 0 days    Minutes of Exercise per Session: 0 min  Stress: No Stress Concern Present (06/29/2023)   [employer] of Occupational Health - Occupational Stress Questionnaire    Feeling of Stress : Not at all  Social Connections: Patient Declined (06/29/2023)   Social Connection and Isolation Panel [NHANES]    Frequency of Communication with Friends and Family: Patient declined    Frequency of Social Gatherings with Friends and Family: Patient declined    Attends Religious Services: Patient declined    Database administrator or Organizations: Patient declined    Attends Banker Meetings: Patient declined    Marital Status: Patient declined    Allergies:  Allergies  Allergen Reactions   Benadryl [Diphenhydramine Hcl]     Hives/ rapid heartrate   Codeine     dizziness   Vicodin [Hydrocodone-Acetaminophen] Other (See Comments)    dizziness    Metabolic Disorder Labs: Lab Results  Component Value Date   HGBA1C 5.2 09/10/2023   MPG 114 09/14/2018   No results found for: "PROLACTIN" Lab Results  Component Value Date   CHOL 138 09/10/2023   TRIG 147 09/10/2023   HDL 41 09/10/2023   CHOLHDL 3.4 09/10/2023   VLDL 18 03/04/2015   LDLCALC 71 09/10/2023   LDLCALC 83 10/30/2022   Lab Results  Component Value Date   TSH 1.250 09/10/2023   TSH 1.700 10/30/2022    Therapeutic Level Labs: Lab Results  Component Value Date   LITHIUM 0.3 (L) 10/24/2018   LITHIUM 0.49 (L) 01/13/2009   Lab Results  Component Value Date   VALPROATE 70 11/11/2022   VALPROATE 29 (L) 06/02/2022   No results found for: "CBMZ"  Current Medications: Current Outpatient Medications  Medication Sig Dispense Refill   rOPINIRole (REQUIP) 0.25 MG tablet Take 1 tablet (0.25 mg total) by mouth at bedtime. 30 tablet 1   divalproex (DEPAKOTE ER) 250 MG 24 hr tablet Take 1 tablet (250 mg total) by mouth daily. Take along with 1000 mg daily , total of 1250 mg 90  tablet 1    divalproex (DEPAKOTE ER) 500 MG 24 hr tablet Take 2 tablets (1,000 mg total) by mouth daily. Take along with 250 mg daily - total of 1250 mg 180 tablet 0   escitalopram (LEXAPRO) 20 MG tablet Take 1 tablet (20 mg total) by mouth daily. 90 tablet 0   esomeprazole (NEXIUM) 20 MG capsule Take 1 capsule (20 mg total) by mouth daily as needed on take on empty stomach. 90 capsule 1   fluticasone (FLONASE) 50 MCG/ACT nasal spray Place 2 sprays into both nostrils daily. 16 g 2   loratadine (CLARITIN) 10 MG tablet Take 1 tablet (10 mg total) by mouth daily. 30 tablet 0   meloxicam (MOBIC) 15 MG tablet Take 1 tablet (15 mg total) by mouth daily. 90 tablet 1   methocarbamol (ROBAXIN) 500 MG tablet Take 1 tablet (500 mg total) by mouth daily. 60 tablet 3   pantoprazole (PROTONIX) 20 MG tablet Take 1 tablet (20 mg total) by mouth daily. 30 tablet 1   rosuvastatin (CRESTOR) 10 MG tablet Take 1 tablet (10 mg total) by mouth daily. 90 tablet 1   traZODone (DESYREL) 100 MG tablet Take 1-2 tablets (100-200 mg total) by mouth at bedtime as needed, for sleep. 180 tablet 1   Vitamin D, Ergocalciferol, (DRISDOL) 1.25 MG (50000 UNIT) CAPS capsule Take 1 capsule (50,000 Units total) by mouth every 7 (seven) days. 20 capsule 3   No current facility-administered medications for this visit.     Musculoskeletal: Strength & Muscle Tone:  UTA Gait & Station:  Seated Patient leans: N/A  Psychiatric Specialty Exam: Review of Systems  Psychiatric/Behavioral:  Positive for dysphoric mood and sleep disturbance.        Grieving    There were no vitals taken for this visit.There is no height or weight on file to calculate BMI.  General Appearance: Guarded  Eye Contact:  Good  Speech:  Clear and Coherent  Volume:  Normal  Mood:   grieving  Affect:  Congruent  Thought Process:  Goal Directed and Descriptions of Associations: Intact  Orientation:  Full (Time, Place, and Person)  Thought Content: Logical   Suicidal  Thoughts:  No  Homicidal Thoughts:  No  Memory:  Immediate;   Fair Recent;   Fair Remote;   Fair  Judgement:  Fair  Insight:  Fair  Psychomotor Activity:  Normal  Concentration:  Concentration: Fair and Attention Span: Fair  Recall:  [employer] of Knowledge: Fair  Language: Fair  Akathisia:  No  Handed:  Left  AIMS (if indicated): not done  Assets:  Desire for Improvement Housing Social Support Transportation  ADL's:  Intact  Cognition: WNL  Sleep:   improving - RLS    Screenings: AIMS    Flowsheet Row Video Visit from 04/06/2023 in [facility] Office Visit from 11/23/2022 in [facility] Video Visit from 06/01/2022 in [facility] Video Visit from 03/24/2022 in [facility] Video Visit from 06/04/2021 in [facility]  AIMS Total Score 0 0 0 0 0      GAD-7    Flowsheet Row Office Visit from 06/10/2023 in [facility] Office Visit from 05/11/2023 in [facility] Office Visit from 04/05/2023 in [facility] Office Visit from 03/05/2023 in [facility] Video Visit from 02/15/2023 in [facility]  Total GAD-7 Score 0 0 7 0 0      PHQ2-9    Flowsheet Row Clinical Support from 06/29/2023 in [facility] Office Visit from 06/10/2023 in [facility] Office Visit from 05/11/2023 in [facility] Office Visit from 04/05/2023 in [facility] Office Visit from 03/05/2023 in [facility]  PHQ-2 Total Score 1 3 0 0 0  PHQ-9 Total Score 3 11 0 7 0      Flowsheet Row Video Visit from 12/09/2023 in [facility] Admission (Discharged) from  09/20/2023 in [facility] Video Visit from 07/01/2023 in [facility]  C-SSRS RISK CATEGORY No Risk No Risk No Risk        Assessment and Plan: AVRYL ROEHM is a 58 year old Caucasian female, widowed, lives in [place], has a history of bipolar disorder, anxiety disorder, COPD, RLS, vitamin B12 deficiency was evaluated by telemedicine today.  Patient with depression symptoms, including sleep problems likely also due to restless leg symptoms, recent grief reaction, discussed assessment and plan as noted below.  Bipolar disorder-unstable Experiencing significant emotional distress due to recent personal losses. Increased appetite attributed to depression. Currently under the care of a counselor, Mr. Montez Morita, with an appointment on the 18th. Discussed the importance of continuing psychiatric care and potential benefits of in-office visits for comprehensive evaluations. - Continue psychiatric care with Mr. Montez Morita - Continue Depakote DR 1250 mg daily (Depakote level-70-dated 11/11/2022-) - Continue Trazodone 100-200 mg at bedtime as needed - Next appointment to be in-office   Generalized anxiety disorder - unstable Increased stress due to personal losses and family dynamics, including limited contact with their granddaughter. Discussed maintaining social connections and exploring options to spend time with family to alleviate anxiety. - Continue Lexapro 20 mg daily - Continue psychotherapy sessions with Mr. Montez Morita.  Insomnia/RLS/Fatigue-unstable Reports restless leg symptoms, previously on benztropine. Not currently on medication for this condition. Discussed potential benefits and risks of starting ropinirole and ruling out vitamin deficiencies. - Start Ropinirole 0.25 mg at bedtime - Continue Trazodone 100-200 mg at bedtime as needed - Order B12 and iron levels to rule out deficiencies contributing to restless leg symptoms  High risk  medication use - I have ordered Depakote labs.  Patient to go to [facility] lab to get it done.  Collaboration of Care: Collaboration of Care: Referral or follow-up with counselor/therapist AEB patient encouraged to continue CBT with Mr. Suzan Garibaldi has upcoming appointment.  Patient/Guardian was advised Release of Information must be obtained prior to any record release in order to collaborate their care with an outside provider. Patient/Guardian was advised if they have not already done so to contact the registration department to sign all necessary forms in order for [name] to release information regarding their care.   Consent: Patient/Guardian gives verbal consent for treatment and assignment of benefits for services provided during this visit. Patient/Guardian expressed understanding and agreed to proceed.   Follow-up - Follow up in four weeks on March 25th at 11:40 AM  This note was generated in part or whole with voice recognition software. Voice recognition is usually quite accurate but there are transcription errors that can and very often do occur. I apologize for any typographical errors that were not detected and corrected.     Jomarie Longs, MD 12/10/2023, 1:34 PM

## 2023-12-10 ENCOUNTER — Encounter: Payer: Self-pay | Admitting: Pharmacist

## 2023-12-10 ENCOUNTER — Other Ambulatory Visit: Payer: Self-pay

## 2023-12-14 ENCOUNTER — Other Ambulatory Visit: Payer: Self-pay

## 2023-12-14 ENCOUNTER — Ambulatory Visit (INDEPENDENT_AMBULATORY_CARE_PROVIDER_SITE_OTHER): Payer: HMO | Admitting: Clinical

## 2023-12-14 DIAGNOSIS — G4700 Insomnia, unspecified: Secondary | ICD-10-CM

## 2023-12-14 DIAGNOSIS — F5105 Insomnia due to other mental disorder: Secondary | ICD-10-CM

## 2023-12-14 DIAGNOSIS — F3131 Bipolar disorder, current episode depressed, mild: Secondary | ICD-10-CM

## 2023-12-14 DIAGNOSIS — R41840 Attention and concentration deficit: Secondary | ICD-10-CM | POA: Diagnosis not present

## 2023-12-14 DIAGNOSIS — F411 Generalized anxiety disorder: Secondary | ICD-10-CM | POA: Diagnosis not present

## 2023-12-14 NOTE — Progress Notes (Signed)
 Virtual Visit via Video Note   I connected with Stacy Chambers on 12/14/23 at  1:00 PM EST by a video enabled telemedicine application and verified that I am speaking with the correct person using two identifiers.   Location: Patient: Home Provider: Office   I discussed the limitations of evaluation and management by telemedicine and the availability of in person appointments. The patient expressed understanding and agreed to proceed.     THERAPIST PROGRESS NOTE   Session Time: 1:00 PM-1:35 PM   Participation Level: Active   Behavioral Response: CasualAlertDepressed   Type of Therapy: Individual Therapy   Treatment Goals addressed: Coping   Interventions: CBT   Summary: Stacy Chambers is a 58 y.o. female who presents with Bipolar Disorder./ GAD/Insomnia. The OPT therapist worked with the patient for her ongoing OPT treatment session. The OPT therapist utilized Motivational Interviewing to assist in creating therapeutic repore. The patient in the session was engaged and work in collaboration giving feedback about her triggers and symptoms over the past few weeks.The patient spoke about ongoing  difficulty with her sleep cycle due to trying to stay up when her partner is home and awake and this has thrown her sleep cycle off. The patient spoke about caregiving for her animals and the loss of a pet recently. The OPT therapist utilized Cognitive Behavioral Therapy through cognitive restructuring as well as worked with the patient on coping strategies to assist in management of mood. The OPT therapist worked with the patient on continuing to maintain her basic needs of self care with eating, hygiene, and sleep. The patient spoke about ongoing work to regulate her sleep cycle and intent to talk with her PCP about help to get her sleep cycle regulated, however, noted she feels her medication for Bipolar is working well. The patient spoke about the life event during recent Valentines Day of  getting married.   Suicidal/Homicidal: Nowithout intent/plan   Therapist Response: The OPT therapist worked with the patient for the patients scheduled session. The patient was engaged in her session and gave feedback in relation to triggers, symptoms, and behavior responses over the past few weeks. The OPT therapist worked with the patient utilizing an in session Cognitive Behavioral Therapy exercise. The patient was responsive in the session and verbalized," I got married on Valentines day". The OPT therapist worked with the patient on staying active , managing her basic health needs, and being consistent with her medication therapy plan. The patient spoke about the extreme cold in the area and staying inside out of the cold. The patient spoke about being excited for going on her honeymoon later in the Spring to the beach.  The OPT therapist overviewed with the patinet upcoming appointments as listed in patients MyChart including upcoming med management appointment with Dr. Elna Breslow 01/18/2024. The patinet spoke about preparation for incoming inclement weather the week noting having a at home generator.The OPT therapist will continue treatment work with the patient in her next scheduled session    Plan: Return again in 3 weeks.   Diagnosis:      Axis I: Bipolar 1 disorder,depressed mild/GAD/Insomnia/ Attention deficit.                             Axis II: No diagnosis    Collaboration of Care: Collaboration of care reviewed with medication therapy involvement with psychiatrist Dr. Elna Breslow.   Patient/Guardian was advised Release of Information must be obtained prior  to any record release in order to collaborate their care with an outside provider. Patient/Guardian was advised if they have not already done so to contact the registration department to sign all necessary forms in order for [name] to release information regarding their care.    Consent: Patient/Guardian gives verbal consent for treatment and  assignment of benefits for services provided during this visit. Patient/Guardian expressed understanding and agreed to proceed.        I discussed the assessment and treatment plan with the patient. The patient was provided an opportunity to ask questions and all were answered. The patient agreed with the plan and demonstrated an understanding of the instructions.   The patient was advised to call back or seek an in-person evaluation if the symptoms worsen or if the condition fails to improve as anticipated.   I provided 35 minutes of non-face-to-face time during this encounter.   [name], LCSW   12/14/2023

## 2023-12-16 ENCOUNTER — Ambulatory Visit: Payer: Self-pay | Admitting: Psychiatry

## 2023-12-22 ENCOUNTER — Emergency Department (HOSPITAL_COMMUNITY): Payer: HMO

## 2023-12-22 ENCOUNTER — Other Ambulatory Visit: Payer: Self-pay

## 2023-12-22 ENCOUNTER — Ambulatory Visit: Payer: Self-pay | Admitting: Family Medicine

## 2023-12-22 ENCOUNTER — Encounter (HOSPITAL_COMMUNITY): Payer: Self-pay

## 2023-12-22 ENCOUNTER — Emergency Department (HOSPITAL_COMMUNITY)
Admission: EM | Admit: 2023-12-22 | Discharge: 2023-12-22 | Disposition: A | Discharge status: Home / Self Care | Payer: HMO | Attending: Emergency Medicine | Admitting: Emergency Medicine

## 2023-12-22 DIAGNOSIS — R0789 Other chest pain: Secondary | ICD-10-CM | POA: Diagnosis present

## 2023-12-22 DIAGNOSIS — S0990XA Unspecified injury of head, initial encounter: Secondary | ICD-10-CM | POA: Diagnosis not present

## 2023-12-22 DIAGNOSIS — R519 Headache, unspecified: Secondary | ICD-10-CM | POA: Diagnosis not present

## 2023-12-22 DIAGNOSIS — R079 Chest pain, unspecified: Secondary | ICD-10-CM | POA: Diagnosis not present

## 2023-12-22 DIAGNOSIS — S20211A Contusion of right front wall of thorax, initial encounter: Secondary | ICD-10-CM | POA: Insufficient documentation

## 2023-12-22 DIAGNOSIS — W06XXXA Fall from bed, initial encounter: Secondary | ICD-10-CM | POA: Diagnosis not present

## 2023-12-22 LAB — CBC
HCT: 38.4 % (ref 36.0–46.0)
Hemoglobin: 12.9 g/dL (ref 12.0–15.0)
MCH: 30.2 pg (ref 26.0–34.0)
MCHC: 33.6 g/dL (ref 30.0–36.0)
MCV: 89.9 fL (ref 80.0–100.0)
Platelets: 204 10*3/uL (ref 150–400)
RBC: 4.27 MIL/uL (ref 3.87–5.11)
RDW: 13 % (ref 11.5–15.5)
WBC: 6.2 10*3/uL (ref 4.0–10.5)
nRBC: 0 % (ref 0.0–0.2)

## 2023-12-22 LAB — BASIC METABOLIC PANEL
Anion gap: 14 (ref 5–15)
BUN: 15 mg/dL (ref 6–20)
CO2: 27 mmol/L (ref 22–32)
Calcium: 10.1 mg/dL (ref 8.9–10.3)
Chloride: 94 mmol/L — ABNORMAL LOW (ref 98–111)
Creatinine, Ser: 0.58 mg/dL (ref 0.44–1.00)
GFR, Estimated: >60 mL/min (ref >60)
Glucose, Bld: 103 mg/dL — ABNORMAL HIGH (ref 70–99)
Potassium: 3.8 mmol/L (ref 3.5–5.1)
Sodium: 135 mmol/L (ref 135–145)

## 2023-12-22 MED ORDER — LIDOCAINE 5 % EX PTCH: 1.0000 | MEDICATED_PATCH | CUTANEOUS | 0 refills | Status: DC

## 2023-12-22 MED ORDER — NAPROXEN 250 MG PO TABS
500.0000 mg | ORAL_TABLET | Freq: Once | ORAL | Status: AC
  Administered 2023-12-22: 500 mg via ORAL
  Filled 2023-12-22: qty 2

## 2023-12-22 MED ORDER — LIDOCAINE 5 % EX PTCH
1.0000 | MEDICATED_PATCH | CUTANEOUS | Status: DC
  Administered 2023-12-22: 1 via TRANSDERMAL
  Filled 2023-12-22: qty 1

## 2023-12-22 NOTE — ED Provider Triage Note (Signed)
 Emergency Medicine Provider Triage Evaluation Note  Stacy Chambers , a 58 y.o. female  was evaluated in triage.  Pt complains of headache and right rib pain after fall of bed this morning.  Hit her head on the table.  No LOC was that she has had a severe headache.  Review of Systems  Positive: Headache, rib pain Negative: Shortness of breath, loss of consciousness  Physical Exam  BP (!) 120/94 (BP Location: Right Arm)   Pulse 88   Temp 98.1 F (36.7 C) (Temporal)   Resp 20   Ht 5\' 4"  (1.626 m)   Wt 69.8 kg   SpO2 100%   BMI 26.41 kg/m  Gen:   Awake, no distress   Resp:  Normal effort  MSK:   Moves extremities without difficulty  Other:    Medical Decision Making  Medically screening exam initiated at 5:57 PM.  Appropriate orders placed.  Stacy Chambers was informed that the remainder of the evaluation will be completed by another provider, this initial triage assessment does not replace that evaluation, and the importance of remaining in the ED until their evaluation is complete.     Ma Rings, [place] 12/22/23 1758

## 2023-12-22 NOTE — ED Provider Notes (Signed)
 [place] EMERGENCY DEPARTMENT AT [facility] Provider Note   CSN: [id number] Arrival date & time: 12/22/23  1555     History  Chief Complaint  Patient presents with   Stacy Chambers    Stacy Chambers is a 58 y.o. female.  Patient is a 58 year old female who presents to Emergency Department with a chief complaint of left-sided headache and right-sided rib pain.  Patient notes that she had a fall out of her bed 2 nights ago striking her ribs and her head on a nightstand.  She denies any known history of bleeding disorders or current anticoagulation use.  She denies any other long bone or joint pain at this time.  She has had no associated dizziness, lightheadedness or syncope.  She denies any pain to her neck or back.  She notes that she did reach out to her primary care doctor who recommended that she come to the emergency department for further evaluation.   Fall Associated symptoms include headaches.       Home Medications Prior to Admission medications   Medication Sig Start Date End Date Taking? Authorizing Provider  divalproex (DEPAKOTE ER) 250 MG 24 hr tablet Take 1 tablet (250 mg total) by mouth daily. Take along with 1000 mg daily , total of 1250 mg 11/12/23   Jomarie Longs, MD  divalproex (DEPAKOTE ER) 500 MG 24 hr tablet Take 2 tablets (1,000 mg total) by mouth daily. Take along with 250 mg daily - total of 1250 mg 11/12/23   Jomarie Longs, MD  escitalopram (LEXAPRO) 20 MG tablet Take 1 tablet (20 mg total) by mouth daily. 10/04/23   Jomarie Longs, MD  esomeprazole (NEXIUM) 20 MG capsule Take 1 capsule (20 mg total) by mouth daily as needed on take on empty stomach. 10/14/23   Gilmore Laroche, FNP  fluticasone (FLONASE) 50 MCG/ACT nasal spray Place 2 sprays into both nostrils daily. 05/11/23   Gilmore Laroche, FNP  loratadine (CLARITIN) 10 MG tablet Take 1 tablet (10 mg total) by mouth daily. 11/11/23   Gilmore Laroche, FNP  meloxicam (MOBIC) 15 MG tablet Take 1 tablet  (15 mg total) by mouth daily. 10/04/23   Gilmore Laroche, FNP  methocarbamol (ROBAXIN) 500 MG tablet Take 1 tablet (500 mg total) by mouth daily. 10/14/23   Gilmore Laroche, FNP  pantoprazole (PROTONIX) 20 MG tablet Take 1 tablet (20 mg total) by mouth daily. 10/22/23   Gilmore Laroche, FNP  rOPINIRole (REQUIP) 0.25 MG tablet Take 1 tablet (0.25 mg total) by mouth at bedtime. 12/09/23   Jomarie Longs, MD  rosuvastatin (CRESTOR) 10 MG tablet Take 1 tablet (10 mg total) by mouth daily. 09/07/23 03/05/24  Gilmore Laroche, FNP  traZODone (DESYREL) 100 MG tablet Take 1-2 tablets (100-200 mg total) by mouth at bedtime as needed, for sleep. 07/29/23   Jomarie Longs, MD  Vitamin D, Ergocalciferol, (DRISDOL) 1.25 MG (50000 UNIT) CAPS capsule Take 1 capsule (50,000 Units total) by mouth every 7 (seven) days. 05/11/23   Gilmore Laroche, FNP      Allergies    Benadryl [diphenhydramine hcl], Codeine, and Vicodin [hydrocodone-acetaminophen]    Review of Systems   Review of Systems  Respiratory:         Right-sided rib pain  Neurological:  Positive for headaches.  All other systems reviewed and are negative.   Physical Exam Updated Vital Signs BP (!) 120/94 (BP Location: Right Arm)   Pulse 88   Temp 98.1 F (36.7 C) (Temporal)   Resp 20  Ht 5\' 4"  (1.626 m)   Wt 69.8 kg   SpO2 100%   BMI 26.41 kg/m  Physical Exam Vitals reviewed.  Constitutional:      Appearance: Normal appearance.  HENT:     Head: Normocephalic and atraumatic.     Nose: Nose normal.     Mouth/Throat:     Mouth: Mucous membranes are moist.  Eyes:     Extraocular Movements: Extraocular movements intact.     Conjunctiva/sclera: Conjunctivae normal.     Pupils: Pupils are equal, round, and reactive to light.  Cardiovascular:     Rate and Rhythm: Normal rate and regular rhythm.     Pulses: Normal pulses.     Heart sounds: Normal heart sounds. No murmur heard.    No gallop.  Pulmonary:     Effort: Pulmonary effort is  normal. No respiratory distress.     Breath sounds: Normal breath sounds. No stridor. No wheezing, rhonchi or rales.  Chest:     Chest wall: Tenderness present.  Abdominal:     General: Abdomen is flat. Bowel sounds are normal. There is no distension.     Palpations: Abdomen is soft.     Tenderness: There is no abdominal tenderness. There is no guarding.  Musculoskeletal:        General: No swelling, tenderness, deformity or signs of injury. Normal range of motion.     Cervical back: Normal range of motion and neck supple. No rigidity or tenderness.  Skin:    General: Skin is warm and dry.     Findings: No bruising or erythema.  Neurological:     General: No focal deficit present.     Mental Status: She is alert and oriented to person, place, and time. Mental status is at baseline.     Cranial Nerves: No cranial nerve deficit.     Sensory: No sensory deficit.     Motor: No weakness.  Psychiatric:        Mood and Affect: Mood normal.        Behavior: Behavior normal.        Thought Content: Thought content normal.        Judgment: Judgment normal.     ED Results / Procedures / Treatments   Labs (all labs ordered are listed, but only abnormal results are displayed) Labs Reviewed  BASIC METABOLIC PANEL - Abnormal; Notable for the following components:      Result Value   Chloride 94 (*)    Glucose, Bld 103 (*)    All other components within normal limits  CBC    EKG None  Radiology CT Head Wo Contrast Result Date: 12/22/2023 CLINICAL DATA:  Headache, right rib pain, hit head after fall from bed this morning EXAM: CT HEAD WITHOUT CONTRAST TECHNIQUE: Contiguous axial images were obtained from the base of the skull through the vertex without intravenous contrast. RADIATION DOSE REDUCTION: This exam was performed according to the departmental dose-optimization program which includes automated exposure control, adjustment of the mA and/or kV according to patient size and/or use  of iterative reconstruction technique. COMPARISON:  None Available. FINDINGS: Brain: No acute infarct or hemorrhage. Lateral ventricles and midline structures are unremarkable. No acute extra-axial fluid collections. No mass effect. Vascular: No hyperdense vessel or unexpected calcification. Skull: No acute fracture.  Hemangioma within the left frontal bone. Sinuses/Orbits: No acute finding. Other: None. IMPRESSION: 1. No acute intracranial process. Electronically Signed   By: Sharlet Salina M.D.   On: 12/22/2023  18:59   DG Chest 2 View Result Date: 12/22/2023 CLINICAL DATA:  Larey Seat 2 days ago, right ribcage pain, pain with inspiration EXAM: CHEST - 2 VIEW COMPARISON:  08/26/2007 FINDINGS: Frontal and lateral views of the chest demonstrate an unremarkable cardiac silhouette. No airspace disease, effusion, or pneumothorax. No acute bony abnormalities. IMPRESSION: 1. No acute intrathoracic process. Electronically Signed   By: Sharlet Salina M.D.   On: 12/22/2023 18:02    Procedures Procedures    Medications Ordered in ED Medications  lidocaine (LIDODERM) 5 % 1 patch (1 patch Transdermal Patch Applied 12/22/23 2002)  naproxen (NAPROSYN) tablet 500 mg (500 mg Oral Given 12/22/23 2001)    ED Course/ Medical Decision Making/ A&P                                 Medical Decision Making Amount and/or Complexity of Data Reviewed Labs: ordered. Radiology: ordered.  Risk Prescription drug management.   This patient presents to the ED for concern of head pain, right sided rib pain differential diagnosis includes acute intracranial hemorrhage, rib fracture, rib contusion, scalp contusion    Additional history obtained:  Additional history obtained from none External records from outside source obtained and reviewed including none   Lab Tests:  I Ordered, and personally interpreted labs.  The pertinent results include: Normal kidney function, no electrolyte changes   Imaging Studies  ordered:  I ordered imaging studies including chest x-ray, head CT I independently visualized and interpreted imaging which showed no acute intracranial hemorrhage, no intrathoracic pathology I agree with the radiologist interpretation   Medicines ordered and prescription drug management:  I ordered medication including naproxen, lidocaine patch for contusion Reevaluation of the patient after these medicines showed that the patient improved I have reviewed the patients home medicines and have made adjustments as needed   Problem List / ED Course:  Patient is doing well at this time and is stable for discharge home.  Discussed with patient that x-ray of the chest demonstrated no signs of rib fracture.  CT scan of the head demonstrated no signs of acute intracranial hemorrhage.  Patient had no other long bone or joint pain noted.  Abdominal exam is benign with no focal tenderness throughout.  She was nontender palpation over cervical, thoracic, lumbar spine.  She is stable for discharge home at this point with continued symptomatic treatment.  Close follow-up with primary care doctor was discussed as well as strict return precautions for any new or worsening symptoms.  Patient voiced understanding had no additional questions.   Social Determinants of Health:  None           Final Clinical Impression(s) / ED Diagnoses Final diagnoses:  None    Rx / DC Orders ED Discharge Orders     None         Kathlen Mody 12/22/23 2009    Terrilee Files, MD 12/23/23 1008

## 2023-12-22 NOTE — ED Triage Notes (Signed)
 Pt arrived via POV after being advised by her PCP office to seek evaluation following a fall 2 days ago. Pt reports she was on the wrong side of the bed, rolled out of bed, hit her night stand and now endorses headache, unsteady gait, right rib cage pain, and reports it hurts to breath.

## 2023-12-22 NOTE — Discharge Instructions (Addendum)
 Please follow-up closely with your primary care doctor on an outpatient basis.  Return to emergency department immediately for any new or worsening symptoms.

## 2023-12-22 NOTE — Telephone Encounter (Signed)
  Chief Complaint: Fall with head injury Symptoms: unsteady balance yesterday, HA, SOB with movement Frequency: 2 nights ago Pertinent Negatives: Patient denies CP, N/V Disposition: [x] ED /[] Urgent Care (no appt availability in office) / [] Appointment(In office/virtual)/ []   Virtual Care/ [] Home Care/ [] Refused Recommended Disposition /[]  Mobile Bus/ []  Follow-up with PCP Additional Notes: Pt reports two nights ago she rolled out of bed and hit her head on the nightstand. She reports yesterday she was experiencing unsteady gait. Reports HA, pain on the right side, painful with movement and SOB. Pt advised to proceed to ED for eval/treat. Pt agrees. This RN educated pt on home care, new-worsening symptoms, when to call back/seek emergent care. Pt verbalized understanding and agrees to plan.    Copied from CRM [id number]. Topic: Clinical - Red Word Triage >> Dec 22, 2023 10:26 AM Clayton Bibles wrote: Red Word that prompted transfer to Nurse Triage: 2 nights ago she fell out of her bed. She hurt her head and pain from chest area to back shoulder Reason for Disposition  [1] ACUTE NEURO SYMPTOM AND [2] now fine  (DEFINITION: difficult to awaken OR confused thinking and talking OR slurred speech OR weakness of arms OR unsteady walking)  Answer Assessment - Initial Assessment Questions 1. MECHANISM: "How did the injury happen?" For falls, ask: "What height did you fall from?" and "What surface did you fall against?"      Fell out of bed, hit head on nightstand 2. ONSET: "When did the injury happen?" (Minutes or hours ago)      2 nights ago 3. NEUROLOGIC SYMPTOMS: "Was there any loss of consciousness?" "Are there any other neurological symptoms?"      None 4. MENTAL STATUS: "Does the person know who they are, who you are, and where they are?"      Yes 5. LOCATION: "What part of the head was hit?"      On crown  8. PAIN: "Is there any pain?" If Yes, ask: "How bad is it?"  (e.g.,  Scale 1-10; or mild, moderate, severe)     HA 10. OTHER SYMPTOMS: "Do you have any other symptoms?" (e.g., neck pain, vomiting)       Pain in right ribs 9/10 with movement, yesterday had balance issues, SOB  Protocols used: Head Injury-A-AH

## 2023-12-23 ENCOUNTER — Other Ambulatory Visit (HOSPITAL_COMMUNITY): Payer: Self-pay

## 2023-12-26 ENCOUNTER — Other Ambulatory Visit: Payer: Self-pay | Admitting: Family Medicine

## 2023-12-27 ENCOUNTER — Other Ambulatory Visit: Payer: Self-pay

## 2023-12-27 ENCOUNTER — Other Ambulatory Visit (HOSPITAL_COMMUNITY): Payer: Self-pay

## 2023-12-30 ENCOUNTER — Other Ambulatory Visit
Admission: RE | Admit: 2023-12-30 | Discharge: 2023-12-30 | Disposition: A | Discharge status: Home / Self Care | Source: Ambulatory Visit | Attending: Psychiatry | Admitting: Psychiatry

## 2023-12-30 DIAGNOSIS — R5383 Other fatigue: Secondary | ICD-10-CM | POA: Diagnosis not present

## 2023-12-30 DIAGNOSIS — G2581 Restless legs syndrome: Secondary | ICD-10-CM | POA: Diagnosis not present

## 2023-12-30 DIAGNOSIS — G4700 Insomnia, unspecified: Secondary | ICD-10-CM | POA: Insufficient documentation

## 2023-12-30 DIAGNOSIS — Z79899 Other long term (current) drug therapy: Secondary | ICD-10-CM | POA: Diagnosis not present

## 2023-12-30 LAB — IRON AND TIBC
Iron: 86 ug/dL (ref 28–170)
Saturation Ratios: 23 % (ref 10.4–31.8)
TIBC: 382 ug/dL (ref 250–450)
UIBC: 296 ug/dL

## 2023-12-30 LAB — VITAMIN B12: Vitamin B-12: 354 pg/mL (ref 180–914)

## 2023-12-30 LAB — VALPROIC ACID LEVEL: Valproic Acid Lvl: 71 ug/mL (ref 50.0–100.0)

## 2023-12-30 LAB — FERRITIN: Ferritin: 92 ng/mL (ref 11–307)

## 2024-01-10 ENCOUNTER — Ambulatory Visit: Payer: HMO | Admitting: Internal Medicine

## 2024-01-11 ENCOUNTER — Ambulatory Visit (INDEPENDENT_AMBULATORY_CARE_PROVIDER_SITE_OTHER): Payer: HMO | Admitting: Clinical

## 2024-01-11 DIAGNOSIS — F411 Generalized anxiety disorder: Secondary | ICD-10-CM

## 2024-01-11 DIAGNOSIS — F3131 Bipolar disorder, current episode depressed, mild: Secondary | ICD-10-CM

## 2024-01-11 DIAGNOSIS — F5105 Insomnia due to other mental disorder: Secondary | ICD-10-CM | POA: Diagnosis not present

## 2024-01-11 NOTE — Progress Notes (Signed)
 Virtual Visit via Video Note   I connected with Stacy Chambers on 01/11/24 at  1:00 PM EST by a video enabled telemedicine application and verified that I am speaking with the correct person using two identifiers.   Location: Patient: Home Provider: Office   I discussed the limitations of evaluation and management by telemedicine and the availability of in person appointments. The patient expressed understanding and agreed to proceed.     THERAPIST PROGRESS NOTE   Session Time: 1:00 PM-1:35 PM   Participation Level: Active   Behavioral Response: CasualAlertDepressed   Type of Therapy: Individual Therapy   Treatment Goals addressed: Coping   Interventions: CBT   Summary: Stacy Chambers is a 58 y.o. female who presents with Bipolar Disorder./ GAD/Insomnia. The OPT therapist worked with the patient for her ongoing OPT treatment session. The OPT therapist utilized Motivational Interviewing to assist in creating therapeutic repore. The patient in the session was engaged and work in collaboration giving feedback about her triggers and symptoms over the past few weeks.The OPT therapist overviewed with the patient recent ER visit from in home fall. The patient spoke about her ongoing adjustment to being married and looking forward to celebrating her honeymoon in May going to [facility]. The OPT therapist utilized Cognitive Behavioral Therapy through cognitive restructuring as well as worked with the patient on coping strategies to assist in management of mood. The OPT therapist worked with the patient on continuing to maintain her basic needs of self care with eating, hygiene, and sleep.    Suicidal/Homicidal: Nowithout intent/plan   Therapist Response: The OPT therapist worked with the patient for the patients scheduled session. The patient was engaged in her session and gave feedback in relation to triggers, symptoms, and behavior responses over the past few weeks. The OPT therapist  worked with the patient overviewing her ongoing recover post a prior in home fall and ER visit from fall around a month ago in February. The patient noted she has fully recovered from the fall in February. The patient spoke about her ongoing adjustment in recently getting married.The OPT therapist worked with the patient utilizing an in session Cognitive Behavioral Therapy exercise. The patient was responsive in the session and verbalized," I didn't think I would ever call another person hubby". The OPT therapist worked with the patient on staying active , managing her basic health needs, and being consistent with her medication therapy plan. The patient spoke about getting prepared for vacation and will be heading to [facility].  The OPT therapist overviewed with the patinet upcoming appointments as listed in patients MyChart including upcoming med management appointment with Dr. Elna Breslow 01/18/2024. The patient noted she felt like her med therapy has continued to help her with her med management. The patient has progressed with the aid of therapy to successfully discharge.The OPT therapist and patient are in agreement with successful discharge.   Plan: Successful Discharge   Diagnosis:      Axis I: Bipolar 1 disorder,depressed mild/GAD/Insomnia/ Attention deficit.                             Axis II: No diagnosis    Collaboration of Care: Collaboration of care reviewed with medication therapy involvement with psychiatrist Dr. Elna Breslow.   Patient/Guardian was advised Release of Information must be obtained prior to any record release in order to collaborate their care with an outside provider. Patient/Guardian was advised if they have not  already done so to contact the registration department to sign all necessary forms in order for [name] to release information regarding their care.    Consent: Patient/Guardian gives verbal consent for treatment and assignment of benefits for services provided during this  visit. Patient/Guardian expressed understanding and agreed to proceed.        I discussed the assessment and treatment plan with the patient. The patient was provided an opportunity to ask questions and all were answered. The patient agreed with the plan and demonstrated an understanding of the instructions.   The patient was advised to call back or seek an in-person evaluation if the symptoms worsen or if the condition fails to improve as anticipated.   I provided 35 minutes of non-face-to-face time during this encounter.   [name], LCSW   01/11/2024

## 2024-01-18 ENCOUNTER — Ambulatory Visit (INDEPENDENT_AMBULATORY_CARE_PROVIDER_SITE_OTHER): Payer: HMO | Admitting: Psychiatry

## 2024-01-18 ENCOUNTER — Other Ambulatory Visit: Payer: Self-pay

## 2024-01-18 ENCOUNTER — Encounter: Payer: Self-pay | Admitting: Psychiatry

## 2024-01-18 VITALS — BP 115/71 | HR 71 | Temp 98.2°F | Ht 64.0 in | Wt 141.8 lb

## 2024-01-18 DIAGNOSIS — R5383 Other fatigue: Secondary | ICD-10-CM

## 2024-01-18 DIAGNOSIS — F411 Generalized anxiety disorder: Secondary | ICD-10-CM

## 2024-01-18 DIAGNOSIS — G2581 Restless legs syndrome: Secondary | ICD-10-CM

## 2024-01-18 DIAGNOSIS — F3176 Bipolar disorder, in full remission, most recent episode depressed: Secondary | ICD-10-CM

## 2024-01-18 DIAGNOSIS — F5105 Insomnia due to other mental disorder: Secondary | ICD-10-CM

## 2024-01-18 NOTE — Progress Notes (Unsigned)
 [facility] MD OP Progress Note  01/18/2024 12:33 PM Stacy Chambers  MRN:  [id number]  Chief Complaint:  Chief Complaint  Patient presents with   Follow-up   Depression   Manic Behavior   Anxiety   Medication Refill   HPI: Stacy Chambers is a 58 year old Caucasian female, lives in [place], has a history of bipolar disorder, GAD, insomnia, bereavement, RLS, vitamin B12 deficiency was evaluated in office today.  Patient today reports she just got married a month ago.  She reports she is currently doing fairly well in her relationship.  She feels she is being pampered a lot by her current husband.  She denies any significant mood swings, depression or anxiety symptoms.  Anxiety is manageable and is mostly situational.  She reports she does struggle with sleep on and off.  Whenever her husband is not home she is unable to sleep at night.  She can go up to 48 hours without sleep when it happens.  She however reports she has not been following a good sleep hygiene.  She watches TV or plays on her phone and does not take the trazodone every night.  When she does take the trazodone that does help with her sleep.  She does report snoring as well as tiredness and fatigue during the day.  She feels unmotivated to do certain chores around the house although overall depression has been fairly stable on the current medication regimen.  She is not interested in referral for sleep study since she does not want to start using a CPAP device.  She currently denies any suicidality, homicidality or perceptual disturbances.  Reports she and her therapist have decided that she does not need psychotherapy sessions anymore since she is currently doing well.  She is currently compliant on the Depakote, denies side effects.  She also takes Requip which was initiated for restless leg symptoms and feels it is beneficial.  Denies side effects.  She recently fell out of her bed since she slept on the wrong side of the bed.   She had herself evaluated and reports other than bruising of her right rib cage she does not have any other injuries.  She however continues to have pain which is intermittent.  She denies any other concerns today.  Visit Diagnosis:    ICD-10-CM   1. Bipolar disorder, in full remission, most recent episode depressed (HCC)  F31.76     2. GAD (generalized anxiety disorder)  F41.1     3. Insomnia due to mental disorder  F51.05    Lack of sleep hygiene, mood symptoms, restless leg symptoms, rule out sleep apnea    4. RLS (restless legs syndrome)  G25.81     5. Fatigue, unspecified type  R53.83    Rule out sleep apnea      Past Psychiatric History: I have reviewed past psychiatric history from progress note on 10/12/2018.  Past trials of medications like Geodon, Zyprexa, risperidone, BuSpar, Zoloft, Wellbutrin, Lamictal, carbamazepine, Vraylar.  Patient today reports 1 suicide attempt after losing her son several years ago.  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   Chronic obstructive pulmonary disease (HCC) 10/03/2015   COPD (chronic obstructive pulmonary disease) (HCC)    no inhalers   Elevated liver function tests 11/26/2004   H/O NEGATIVE LIVER BIOPSY, workup negative   Gallstones 02/21/2015   GERD (gastroesophageal reflux disease)    Hyperlipidemia    Insomnia    Lower back pain    Osteoarthritis    Ovarian cancer (HCC) 1995   RLS (restless legs syndrome)    Sedative, hypnotic or anxiolytic dependence with withdrawal, 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HCC)    Wears dentures    full upper and lower    Past Surgical History:  Procedure Laterality Date   ABDOMINAL  HYSTERECTOMY     APPENDECTOMY     BREAST BIOPSY Left 03/12/2015   fat necrosis   CESAREAN SECTION     COLONOSCOPY WITH PROPOFOL N/A 05/30/2018   Procedure: COLONOSCOPY WITH PROPOFOL;  Surgeon: Scot Jun, MD;  Location: [facility];  Service: Endoscopy;  Laterality: N/A;   COLONOSCOPY WITH PROPOFOL N/A 09/20/2023   Procedure: COLONOSCOPY WITH PROPOFOL;  Surgeon: Lanelle Bal, DO;  Location[facility];  Service: Endoscopy;  Laterality: N/A;  930am, asa 3   EXCISION MORTON'S NEUROMA Left 10/07/2016   Procedure: EXCISION MORTON'S NEUROMA;  Surgeon: Gwyneth Revels, DPM;  Location: [facility];  Service: Podiatry;  Laterality: Left;  IV WITH LOCAL   LIVER BIOPSY  2005   PerPt: Done to eval elevated LFTs and biopsy provided no definite dx/cause of elevated LFTs   MULTIPLE TOOTH EXTRACTIONS     OOPHORECTOMY     OSTECTOMY Right 04/10/2020   Procedure: DOUBLE OSTEOTOMY GENERAL W/ LOCAL;  Surgeon: Gwyneth Revels, DPM;  Location: [facility];  Service: Podiatry;  Laterality: Right;    Family Psychiatric History: I have reviewed family psychiatric history from progress note on 10/12/2018.  Family History:  Family History  Adopted: Yes  Problem Relation Age of Onset   COPD Mother    Depression Mother    Colon cancer Mother        unsure of age   Cancer Father        Lung CA, Skin Cancer--?type   COPD Father    Stroke Father    Cancer Sister 54       cervical cancer   COPD Brother    Alcohol abuse Brother    Stroke Brother    COPD Maternal Aunt    Depression Daughter    Schizophrenia Son    Breast cancer Neg Hx     Social History: I have reviewed social history from progress note on 10/12/2018. Social History   Socioeconomic History   Marital status: Widowed    Spouse name: Not on file   Number of children: Not on file   Years of education: Not on file   Highest education level: 10th grade  Occupational History   Not on file  Tobacco Use    Smoking status: Former    Current packs/day: 0.00    Average packs/day: 1 pack/day for 30.0 years (30.0 ttl pk-yrs)    Types: Cigarettes    Start date: 04/09/1979    Quit date: 04/08/2009    Years since quitting: 14.7   Smokeless tobacco: Never  Vaping Use   Vaping status: Every Day  Substance and Sexual Activity   Alcohol use: Not Currently    Alcohol/week: 3.0 - 7.0 standard drinks of alcohol    Types: 3 - 7 Shots of liquor per week  Comment: occasionally   Drug use: Not Currently    Types: Marijuana    Comment: told to refrain until after surgery   Sexual activity: Yes    Birth control/protection: Surgical  Other Topics Concern   Not on file  Social History Narrative   Not on file   Social Drivers of Health   Financial Resource Strain: Low Risk  (06/29/2023)   Overall Financial Resource Strain (CARDIA)    Difficulty of Paying Living Expenses: Not hard at all  Food Insecurity: Food Insecurity Present (07/07/2023)   Hunger Vital Sign    Worried About Running Out of Food in the Last Year: Never true    Ran Out of Food in the Last Year: Often true  Transportation Needs: No Transportation Needs (07/07/2023)   PRAPARE - Administrator, Civil Service (Medical): No    Lack of Transportation (Non-Medical): No  Physical Activity: Inactive (06/29/2023)   Exercise Vital Sign    Days of Exercise per Week: 0 days    Minutes of Exercise per Session: 0 min  Stress: No Stress Concern Present (06/29/2023)   [employer] of Occupational Health - Occupational Stress Questionnaire    Feeling of Stress : Not at all  Social Connections: Patient Declined (06/29/2023)   Social Connection and Isolation Panel [NHANES]    Frequency of Communication with Friends and Family: Patient declined    Frequency of Social Gatherings with Friends and Family: Patient declined    Attends Religious Services: Patient declined    Database administrator or Organizations: Patient declined    Attends  Banker Meetings: Patient declined    Marital Status: Patient declined    Allergies:  Allergies  Allergen Reactions   Benadryl [Diphenhydramine Hcl]     Hives/ rapid heartrate   Codeine     dizziness   Vicodin [Hydrocodone-Acetaminophen] Other (See Comments)    dizziness    Metabolic Disorder Labs: Lab Results  Component Value Date   HGBA1C 5.2 09/10/2023   MPG 114 09/14/2018   No results found for: "PROLACTIN" Lab Results  Component Value Date   CHOL 138 09/10/2023   TRIG 147 09/10/2023   HDL 41 09/10/2023   CHOLHDL 3.4 09/10/2023   VLDL 18 03/04/2015   LDLCALC 71 09/10/2023   LDLCALC 83 10/30/2022   Lab Results  Component Value Date   TSH 1.250 09/10/2023   TSH 1.700 10/30/2022    Therapeutic Level Labs: Lab Results  Component Value Date   LITHIUM 0.3 (L) 10/24/2018   LITHIUM 0.49 (L) 01/13/2009   Lab Results  Component Value Date   VALPROATE 71 12/30/2023   VALPROATE 70 11/11/2022   No results found for: "CBMZ"  Current Medications: Current Outpatient Medications  Medication Sig Dispense Refill   divalproex (DEPAKOTE ER) 250 MG 24 hr tablet Take 1 tablet (250 mg total) by mouth daily. Take along with 1000 mg daily , total of 1250 mg 90 tablet 1   divalproex (DEPAKOTE ER) 500 MG 24 hr tablet Take 2 tablets (1,000 mg total) by mouth daily. Take along with 250 mg daily - total of 1250 mg 180 tablet 0   esomeprazole (NEXIUM) 20 MG capsule Take 1 capsule (20 mg total) by mouth daily as needed on take on empty stomach. 90 capsule 1   fluticasone (FLONASE) 50 MCG/ACT nasal spray Place 2 sprays into both nostrils daily. 16 g 2   lidocaine (LIDODERM) 5 % Place 1 patch onto the skin daily. Remove &  Discard patch within 12 hours or as directed by MD 20 patch 0   meloxicam (MOBIC) 15 MG tablet Take 1 tablet (15 mg total) by mouth daily. 90 tablet 1   methocarbamol (ROBAXIN) 500 MG tablet Take 1 tablet (500 mg total) by mouth daily. 60 tablet 3    pantoprazole (PROTONIX) 20 MG tablet Take 1 tablet (20 mg total) by mouth daily. 30 tablet 1   rOPINIRole (REQUIP) 0.25 MG tablet Take 1 tablet (0.25 mg total) by mouth at bedtime. 30 tablet 1   rosuvastatin (CRESTOR) 10 MG tablet Take 1 tablet (10 mg total) by mouth daily. 90 tablet 1   traZODone (DESYREL) 100 MG tablet Take 1-2 tablets (100-200 mg total) by mouth at bedtime as needed, for sleep. 180 tablet 1   No current facility-administered medications for this visit.     Musculoskeletal: Strength & Muscle Tone: within normal limits Gait & Station: normal Patient leans: N/A  Psychiatric Specialty Exam: Review of Systems  Psychiatric/Behavioral:  Positive for sleep disturbance (varies). The patient is nervous/anxious.     Blood pressure 115/71, pulse 71, temperature 98.2 F (36.8 C), temperature source Skin, height 5\' 4"  (1.626 m), weight 141 lb 12.8 oz (64.3 kg).Body mass index is 24.34 kg/m.  General Appearance: Casual  Eye Contact:  Fair  Speech:  Clear and Coherent  Volume:  Normal  Mood:  Anxious  Affect:  Congruent  Thought Process:  Goal Directed and Descriptions of Associations: Intact  Orientation:  Full (Time, Place, and Person)  Thought Content: Logical   Suicidal Thoughts:  No  Homicidal Thoughts:  No  Memory:  Immediate;   Fair Recent;   Fair Remote;   Fair  Judgement:  Fair  Insight:  Fair  Psychomotor Activity:  Normal  Concentration:  Concentration: Fair and Attention Span: Fair  Recall:  [employer] of Knowledge: Fair  Language: Fair  Akathisia:  No  Handed:  Left  AIMS (if indicated): done  Assets:  Desire for Improvement Housing Social Support Transportation  ADL's:  Intact  Cognition: WNL  Sleep:   varies   Screenings: AIMS    Flowsheet Row Video Visit from 04/06/2023 in [facility] Office Visit from 11/23/2022 in [facility] Video Visit from 06/01/2022 in [facility] Video Visit from 03/24/2022 in [facility] Video Visit from 06/04/2021 in [facility]  AIMS Total Score 0 0 0 0 0      GAD-7    Flowsheet Row Office Visit from 01/18/2024 in [facility] Office Visit from 06/10/2023 in [facility] Office Visit from 05/11/2023 in [facility] Office Visit from 04/05/2023 in [facility] Office Visit from 03/05/2023 in [facility]  Total GAD-7 Score 7 0 0 7 0      PHQ2-9    Flowsheet Row Office Visit from 01/18/2024 in [facility] Clinical Support from 06/29/2023 in [facility] Office Visit from 06/10/2023 in [facility] Office Visit from 05/11/2023 in [facility] Office Visit from 04/05/2023 in [facility]  PHQ-2 Total Score 1 1 3  0 0  PHQ-9 Total Score -- 3 11 0 7      Flowsheet Row Office Visit from 01/18/2024 in [facility] ED from 12/22/2023 in [facility] Emergency Department at [facility] Video Visit from 12/09/2023 in [facility]  C-SSRS RISK CATEGORY Moderate Risk No Risk No Risk        Assessment and Plan: Stacy Chambers is a 58 year old Caucasian female, widowed, lives in [place], has a history of bipolar disorder, anxiety disorder, COPD, RLS, vitamin B12 deficiency was evaluated in office today.  Discussed assessment and plan as noted below.  Bipolar disorder in remission Currently reports overall mood symptoms are stable on the current medication regimen. - Continue Depakote DR 1250 mg daily. (Reviewed and discussed Depakote level 12/30/2023-therapeutic at  71)  Generalized anxiety disorder-improving Currently reports anxiety symptoms as situational although she is coping okay.  As per discussion with psychotherapist Mr. Montez Morita she does not need therapy at this time and could use it as needed. - Discontinue Lexapro for noncompliance.  Patient reports she has not used it in months. - Continue CBT as needed.  Insomnia/RLS/fatigue-improving Restless leg symptoms improved on the current dose of propranolol.  Does continue to struggle with sleep mostly because of lack of sleep hygiene.  Patient does report snoring although not interested in referral for sleep study. - Continue Ropinirole 0.25 mg at bedtime. - Continue Trazodone 100-200 mg at bedtime as needed, has not been using it regularly. - Discussed sleep hygiene techniques. - Patient refused a referral for sleep study. - Reviewed iron level-12/30/2023-within normal limits.  Follow-up - Follow-up in clinic in 3 months or sooner if needed.   Collaboration of Care: Collaboration of Care: Patient refused AEB patient refused referral for sleep study.  Patient/Guardian was advised Release of Information must be obtained prior to any record release in order to collaborate their care with an outside provider. Patient/Guardian was advised if they have not already done so to contact the registration department to sign all necessary forms in order for [name] to release information regarding their care.   Consent: Patient/Guardian gives verbal consent for treatment and assignment of benefits for services provided during this visit. Patient/Guardian expressed understanding and agreed to proceed.   This note was generated in part or whole with voice recognition software. Voice recognition is usually quite accurate but there are transcription errors that can and very often do occur. I apologize for any typographical errors that were not detected and corrected.    Jomarie Longs, MD 01/18/2024, 12:33 PM

## 2024-01-27 ENCOUNTER — Ambulatory Visit: Payer: Self-pay | Admitting: Family Medicine

## 2024-02-01 ENCOUNTER — Other Ambulatory Visit: Payer: Self-pay | Admitting: Family Medicine

## 2024-02-01 DIAGNOSIS — R1084 Generalized abdominal pain: Secondary | ICD-10-CM

## 2024-02-02 ENCOUNTER — Other Ambulatory Visit: Payer: Self-pay | Admitting: Psychiatry

## 2024-02-02 ENCOUNTER — Other Ambulatory Visit: Payer: Self-pay

## 2024-02-02 ENCOUNTER — Other Ambulatory Visit (HOSPITAL_COMMUNITY): Payer: Self-pay

## 2024-02-02 ENCOUNTER — Other Ambulatory Visit: Payer: Self-pay | Admitting: Family Medicine

## 2024-02-02 DIAGNOSIS — E785 Hyperlipidemia, unspecified: Secondary | ICD-10-CM

## 2024-02-02 DIAGNOSIS — F411 Generalized anxiety disorder: Secondary | ICD-10-CM

## 2024-02-02 MED ORDER — ROSUVASTATIN CALCIUM 10 MG PO TABS
10.0000 mg | ORAL_TABLET | Freq: Every day | ORAL | 1 refills | Status: DC
  Filled 2024-02-02 – 2024-03-25 (×2): qty 90, 90d supply, fill #0
  Filled 2024-04-11 – 2024-07-27 (×4): qty 90, 90d supply, fill #1

## 2024-02-02 MED ORDER — PANTOPRAZOLE SODIUM 20 MG PO TBEC
20.0000 mg | DELAYED_RELEASE_TABLET | Freq: Every day | ORAL | 1 refills | Status: DC
  Filled 2024-02-02 – 2024-02-07 (×2): qty 30, 30d supply, fill #0
  Filled 2024-03-25: qty 30, 30d supply, fill #1

## 2024-02-02 MED ORDER — TRAZODONE HCL 100 MG PO TABS
100.0000 mg | ORAL_TABLET | Freq: Every evening | ORAL | 1 refills | Status: DC | PRN
Start: 2024-02-02 — End: 2024-08-16
  Filled 2024-02-02 – 2024-03-25 (×2): qty 180, 90d supply, fill #0
  Filled 2024-04-11 – 2024-07-27 (×4): qty 180, 90d supply, fill #1

## 2024-02-02 MED ORDER — ESOMEPRAZOLE MAGNESIUM 20 MG PO CPDR
20.0000 mg | DELAYED_RELEASE_CAPSULE | Freq: Every day | ORAL | 1 refills | Status: DC | PRN
  Filled 2024-02-02 – 2024-03-25 (×2): qty 90, 90d supply, fill #0
  Filled 2024-04-11 – 2024-07-27 (×4): qty 90, 90d supply, fill #1

## 2024-02-03 ENCOUNTER — Other Ambulatory Visit: Payer: Self-pay

## 2024-02-03 ENCOUNTER — Other Ambulatory Visit (HOSPITAL_COMMUNITY): Payer: Self-pay

## 2024-02-03 MED ORDER — LIDOCAINE 5 % EX PTCH
1.0000 | MEDICATED_PATCH | CUTANEOUS | 0 refills | Status: DC
  Filled 2024-02-03 – 2024-02-22 (×3): qty 20, 20d supply, fill #0

## 2024-02-07 ENCOUNTER — Other Ambulatory Visit (HOSPITAL_COMMUNITY): Payer: Self-pay

## 2024-02-08 ENCOUNTER — Other Ambulatory Visit: Payer: Self-pay

## 2024-02-09 ENCOUNTER — Telehealth: Payer: Self-pay | Admitting: Acute Care

## 2024-02-09 DIAGNOSIS — Z87891 Personal history of nicotine dependence: Secondary | ICD-10-CM

## 2024-02-09 DIAGNOSIS — Z122 Encounter for screening for malignant neoplasm of respiratory organs: Secondary | ICD-10-CM

## 2024-02-09 NOTE — Telephone Encounter (Signed)
 Lung Cancer Screening Narrative/Criteria Questionnaire (Cigarette Smokers Only- No Cigars/Pipes/vapes)   Stacy Chambers   SDMV:03/07/2024 at 10:30am with Stacy Chambers        03-04-1966   LDCT: 03/08/2024 at 9:00am at [facility]    58 y.o.   Phone: [phone]  Lung Screening Narrative (confirm age 17-77 yrs Medicare / 50-80 yrs Private pay insurance)   Insurance information:HTA   Referring Provider:Dr. Zarwolo   This screening involves an initial phone call with a team member from our program. It is called a shared decision making visit. The initial meeting is required by  insurance and Medicare to make sure you understand the program. This appointment takes about 15-20 minutes to complete. You will complete the screening scan at your scheduled date/time.  This scan takes about 5-10 minutes to complete. You can eat and drink normally before and after the scan.  Criteria questions for Lung Cancer Screening:   Are you a current or former smoker? Former Age began smoking: 58yo   If you are a former smoker, what year did you quit smoking? 2011(within 15 yrs)   To calculate your smoking history, I need an accurate estimate of how many packs of cigarettes you smoked per day and for how many years. (Not just the number of PPD you are now smoking)   Years smoking 39 x Packs per day 1 = Pack years 39   (at least 20 pack yrs)   (Make sure they understand that we need to know how much they have smoked in the past, not just the number of PPD they are smoking now)  Do you have a personal history of cancer?  Yes - (type and when diagnosed - 5 yrs cancer free) Cervical and ovarian - dx at age 76 and had a total hysterectomy, dx with breast cancer at age 22yo and had lumpectomy. Per pt she did not receive any chemo/radiation therapy.     Do you have a family history of cancer? Yes  (cancer type and and relative) sister #1 - brain, sister#2 - pancreatic, brother - testicular   Are you coughing up blood?   No  Have you had unexplained weight loss of 15 lbs or more in the last 6 months? No  It looks like you meet all criteria.  When would be a good time for us  to schedule you for this screening?   Additional information: N/A

## 2024-02-11 ENCOUNTER — Other Ambulatory Visit: Payer: Self-pay

## 2024-02-11 ENCOUNTER — Telehealth: Payer: Self-pay

## 2024-02-11 ENCOUNTER — Other Ambulatory Visit (HOSPITAL_COMMUNITY): Payer: Self-pay

## 2024-02-11 NOTE — Telephone Encounter (Signed)
 pt called states that she wants to go back on the lexapro .

## 2024-02-11 NOTE — Telephone Encounter (Signed)
 Please advise her that Dr. Tere Felts is out until mid next week. We will defer this to her, as she is more familiar with the patient's condition and will be better able to provide appropriate guidance.Let me know if any additional concerns.

## 2024-02-11 NOTE — Telephone Encounter (Signed)
 pt was notified that message would be sent to dr. Tere Felts and she will not be back until next week.

## 2024-02-12 ENCOUNTER — Other Ambulatory Visit (HOSPITAL_COMMUNITY): Payer: Self-pay

## 2024-02-16 NOTE — Telephone Encounter (Signed)
 Returned call to patient.  Patient reports she was depressed and had impulsive shopping sprees hence went back on the Lexapro  20 mg.  She feels the Lexapro  is currently helpful and she wants to stay on this.  She has 90-day supply of the medication right now and does not need a refill sent to the pharmacy at this time.  Patient also declined a sooner appointment with this provider and reports she is doing fine on the Lexapro  and would like to just keep her appointment on June 26.

## 2024-02-21 ENCOUNTER — Other Ambulatory Visit: Payer: Self-pay

## 2024-02-21 ENCOUNTER — Other Ambulatory Visit: Payer: Self-pay | Admitting: Family Medicine

## 2024-02-21 ENCOUNTER — Other Ambulatory Visit (HOSPITAL_COMMUNITY): Payer: Self-pay

## 2024-02-21 DIAGNOSIS — J301 Allergic rhinitis due to pollen: Secondary | ICD-10-CM

## 2024-02-21 MED ORDER — FLUTICASONE PROPIONATE 50 MCG/ACT NA SUSP
2.0000 | Freq: Every day | NASAL | 2 refills | Status: AC
  Filled 2024-02-21: qty 16, 30d supply, fill #0
  Filled 2024-06-09: qty 16, 30d supply, fill #1

## 2024-02-22 ENCOUNTER — Other Ambulatory Visit: Payer: Self-pay | Admitting: Psychiatry

## 2024-02-22 ENCOUNTER — Other Ambulatory Visit: Payer: Self-pay

## 2024-02-22 ENCOUNTER — Encounter: Payer: Self-pay | Admitting: Pharmacist

## 2024-02-22 DIAGNOSIS — G2581 Restless legs syndrome: Secondary | ICD-10-CM

## 2024-02-22 DIAGNOSIS — F5105 Insomnia due to other mental disorder: Secondary | ICD-10-CM

## 2024-02-22 MED ORDER — ROPINIROLE HCL 0.25 MG PO TABS
0.2500 mg | ORAL_TABLET | Freq: Every day | ORAL | 1 refills | Status: DC
  Filled 2024-02-22 – 2024-03-25 (×2): qty 30, 30d supply, fill #0
  Filled 2024-04-11 – 2024-05-13 (×2): qty 30, 30d supply, fill #1

## 2024-02-23 ENCOUNTER — Other Ambulatory Visit (HOSPITAL_COMMUNITY): Payer: Self-pay

## 2024-03-07 ENCOUNTER — Ambulatory Visit: Admitting: Physician Assistant

## 2024-03-07 ENCOUNTER — Encounter: Payer: Self-pay | Admitting: Physician Assistant

## 2024-03-07 DIAGNOSIS — Z87891 Personal history of nicotine dependence: Secondary | ICD-10-CM | POA: Diagnosis not present

## 2024-03-07 NOTE — Progress Notes (Signed)
 Virtual Visit via Telephone Note  I connected with Stacy Chambers on 03/07/24 at 10:26 AM by telephone and verified that I am speaking with the correct person using two identifiers.  Location: Patient: home Provider: working virtually from home   I discussed the limitations, risks, security and privacy concerns of performing an evaluation and management service by telephone and the availability of in person appointments. I also discussed with the patient that there may be a patient responsible charge related to this service. The patient expressed understanding and agreed to proceed.       Shared Decision Making Visit Lung Cancer Screening Program [phone])   Eligibility: Age 58 Pack Years Smoking History Calculation 71 (# packs/per year x # years smoked) Recent History of coughing up blood  no Unexplained weight loss? No ( >Than 15 pounds within the last 6 months ) Prior History Lung / other cancer No (Diagnosis within the last 5 years already requiring surveillance chest CT Scans). Smoking Status Former Smoker Former Smokers: Years since quit: 14 years  Quit Date: 2011  Visit Components: Discussion included one or more decision making aids? Yes Discussion included risk/benefits of screening. Yes Discussion included potential follow up diagnostic testing for abnormal scans. Yes Discussion included meaning and risk of over diagnosis. Yes Discussion included meaning and risk of False Positives. Yes Discussion included meaning of total radiation exposure. Yes  Counseling Included: Importance of adherence to annual lung cancer LDCT screening. Yes Impact of comorbidities on ability to participate in the program. Yes Ability and willingness to under diagnostic treatment. Yes  Smoking Cessation Counseling: Former Smokers:  Discussed the importance of maintaining cigarette abstinence. Yes Diagnosis Code: Personal History of Nicotine Dependence. Y78.295 Information about  tobacco cessation classes and interventions provided to patient. Yes Written Order for Lung Cancer Screening with LDCT placed in Epic. Yes (CT Chest Lung Cancer Screening Low Dose W/O CM) [id number] Z12.2-Screening of respiratory organs Z87.891-Personal history of nicotine dependence    I spent 25 minutes of face to face time/virtual visit time  with the patient discussing the risks and benefits of lung cancer screening. We took the time to pause at intervals to allow for questions to be asked and answered to ensure understanding. We discussed that they had taken the single most powerful action possible to decrease their risk of developing lung cancer when they quit smoking. I counseled them to remain smoke free, and to contact the office if they ever had the desire to smoke again so that we can provide resources and tools to help support the effort to remain smoke free. We discussed the time and location of the scan, and they  will receive a call or letter with the results within  24-72 hours of receiving them. They have the office contact information in the event they have questions.   They verbalized understanding of all of the above and had no further questions.    I explained to the patient that there has been a high incidence of coronary artery disease noted on these exams. I explained that this is a non-gated exam therefore degree or severity cannot be determined. This patient is on statin therapy. I have asked the patient to follow-up with their PCP regarding any incidental finding of coronary artery disease and management with diet or medication as they feel is clinically indicated. The patient verbalized understanding of the above and had no further questions.      Patt Boozer Oreoluwa Gilmer, PA-C

## 2024-03-07 NOTE — Patient Instructions (Signed)

## 2024-03-08 ENCOUNTER — Ambulatory Visit (HOSPITAL_COMMUNITY)
Admission: RE | Admit: 2024-03-08 | Discharge: 2024-03-08 | Disposition: A | Discharge status: Home / Self Care | Source: Ambulatory Visit | Attending: Acute Care | Admitting: Acute Care

## 2024-03-08 DIAGNOSIS — Z87891 Personal history of nicotine dependence: Secondary | ICD-10-CM | POA: Diagnosis not present

## 2024-03-08 DIAGNOSIS — Z122 Encounter for screening for malignant neoplasm of respiratory organs: Secondary | ICD-10-CM | POA: Insufficient documentation

## 2024-03-09 ENCOUNTER — Ambulatory Visit: Payer: Self-pay

## 2024-03-09 NOTE — Telephone Encounter (Signed)
 Copied from CRM [id number]. Topic: Clinical - Red Word Triage >> Mar 09, 2024 12:38 PM Ary Bitter R wrote: Red Word that prompted transfer to Nurse Triage: Left breast pain, hurts when leaning over and feels a hard lump. Been feeling like this for a week. Swollen. Wants to have an appt to get it checked out.   Chief Complaint: Left Breast Pain/Lump Symptoms: pain/lump Frequency: x 1 week Pertinent Negatives: Patient denies fever, redness, rashes Disposition: [] ED /[] Urgent Care (no appt availability in office) / [x] Appointment(In office/virtual)/ []  [place] [facility]/ [] Home Care/ [] Refused Recommended Disposition /[] [place] [facility]/ []  Follow-up with PCP Additional Notes: Patient called and advised that for the past week she has had left breast pain and a lump that she feels near her nipple. She states that the lump is smaller than a golf ball but larger than a marble. She also states that a small amount of clear fluid can be expressed from her nipple every so often.  Appointment made for 03/10/2024 at 8 am with Alison Irvine.  Patient is also advised that if anything gets worse to go to the Emergency Room. Patient verbalized understanding.  Reason for Disposition  Breast lump  Answer Assessment - Initial Assessment Questions 1. SYMPTOM: "What's the main symptom you're concerned about?"  (e.g., lump, pain, rash, nipple discharge)     Pain/lump 2. LOCATION: "Where is the pain/lump located?"     Left breast around the areola 3. ONSET: "When did pain/lump  start?"     A week ago 4. PRIOR HISTORY: "Do you have any history of prior problems with your breasts?" (e.g., lumps, cancer, fibrocystic breast disease)     No 5. CAUSE: "What do you think is causing this symptom?"     Unknown 6. OTHER SYMPTOMS: "Do you have any other symptoms?" (e.g., fever, breast pain, redness or rash, nipple discharge)     Feels some wetness every once in a while---clear from nipple 7.  PREGNANCY-BREASTFEEDING: "Is there any chance you are pregnant?" "When was your last menstrual period?" "Are you breastfeeding?"     No  Protocols used: Breast Symptoms-A-AH

## 2024-03-10 ENCOUNTER — Other Ambulatory Visit (HOSPITAL_COMMUNITY): Payer: Self-pay

## 2024-03-10 ENCOUNTER — Ambulatory Visit: Payer: Self-pay

## 2024-03-10 VITALS — BP 112/63 | HR 60 | Ht 64.0 in | Wt 133.0 lb

## 2024-03-10 DIAGNOSIS — N6342 Unspecified lump in left breast, subareolar: Secondary | ICD-10-CM | POA: Diagnosis not present

## 2024-03-10 DIAGNOSIS — G8929 Other chronic pain: Secondary | ICD-10-CM

## 2024-03-10 MED ORDER — METHOCARBAMOL 500 MG PO TABS
500.0000 mg | ORAL_TABLET | Freq: Every day | ORAL | 5 refills | Status: AC
  Filled 2024-03-10 – 2024-04-11 (×3): qty 60, 60d supply, fill #0
  Filled 2024-05-13 – 2024-06-30 (×3): qty 60, 60d supply, fill #1
  Filled 2024-07-27 – 2024-08-16 (×2): qty 60, 60d supply, fill #2
  Filled 2024-09-09 – 2024-09-30 (×3): qty 60, 60d supply, fill #3

## 2024-03-10 NOTE — Progress Notes (Signed)
   Acute Office Visit  Subjective:     Patient ID: Stacy Chambers, female    DOB: 12-Mar-1966, 58 y.o.   MRN: [id number]  Chief Complaint  Patient presents with   Medical Management of Chronic Issues    Pt states "Lump in left breast/ pain full for a week"    HPI Patient is in today for left breast mass with tenderness.  Symptoms have been present for past week.   ROS      Objective:    BP 112/63   Pulse 60   Ht 5\' 4"  (1.626 m)   Wt 133 lb (60.3 kg)   SpO2 95%   BMI 22.83 kg/m    Physical Exam Vitals and nursing note reviewed.  Constitutional:      Appearance: Normal appearance.  Eyes:     Extraocular Movements: Extraocular movements intact.     Pupils: Pupils are equal, round, and reactive to light.  Chest:  Breasts:    Right: Normal.     Left: Inverted nipple and mass (ill defined mass, subareolar. tender with palpation.) present. No swelling.  Lymphadenopathy:     Upper Body:     Right upper body: No supraclavicular, axillary or pectoral adenopathy.     Left upper body: No supraclavicular, axillary or pectoral adenopathy.  Neurological:     Mental Status: She is alert and oriented to person, place, and time.  Psychiatric:        Mood and Affect: Mood normal.        Thought Content: Thought content normal.     No results found for any visits on 03/10/24.      Assessment & Plan:   Problem List Items Addressed This Visit       Other   Chronic pain   Relevant Medications   methocarbamol  (ROBAXIN ) 500 MG tablet   Other Visit Diagnoses       Subareolar mass of left breast    -  Primary   Relevant Orders   MS 3D DIAG MAMMO BILAT BR (aka MM)       Meds ordered this encounter  Medications   methocarbamol  (ROBAXIN ) 500 MG tablet    Sig: Take 1 tablet (500 mg total) by mouth daily.    Dispense:  60 tablet    Refill:  5    No follow-ups on file.  Alison Irvine, FNP

## 2024-03-14 ENCOUNTER — Telehealth: Payer: Self-pay

## 2024-03-14 NOTE — Telephone Encounter (Signed)
 Message sent to mychart. Results are still pending.

## 2024-03-14 NOTE — Telephone Encounter (Signed)
 Copied from CRM [id number]. Topic: Clinical - Lab/Test Results >> Mar 14, 2024 10:34 AM Essie A wrote: Reason for CRM: Patient had a CT scan done on her lungs on 03/08/24 and would like to get the results.  Please return her call [phone].

## 2024-03-25 ENCOUNTER — Other Ambulatory Visit (HOSPITAL_COMMUNITY): Payer: Self-pay

## 2024-03-25 ENCOUNTER — Other Ambulatory Visit: Payer: Self-pay

## 2024-03-25 ENCOUNTER — Other Ambulatory Visit: Payer: Self-pay | Admitting: Psychiatry

## 2024-03-25 DIAGNOSIS — F411 Generalized anxiety disorder: Secondary | ICD-10-CM

## 2024-03-27 ENCOUNTER — Other Ambulatory Visit: Payer: Self-pay

## 2024-03-27 ENCOUNTER — Other Ambulatory Visit (HOSPITAL_COMMUNITY): Payer: Self-pay

## 2024-03-27 MED ORDER — DIVALPROEX SODIUM ER 500 MG PO TB24
1000.0000 mg | ORAL_TABLET | Freq: Every day | ORAL | 0 refills | Status: DC
  Filled 2024-03-27: qty 180, 90d supply, fill #0

## 2024-03-28 ENCOUNTER — Other Ambulatory Visit: Payer: Self-pay

## 2024-03-28 DIAGNOSIS — Z122 Encounter for screening for malignant neoplasm of respiratory organs: Secondary | ICD-10-CM

## 2024-03-28 DIAGNOSIS — F17210 Nicotine dependence, cigarettes, uncomplicated: Secondary | ICD-10-CM

## 2024-03-28 DIAGNOSIS — Z87891 Personal history of nicotine dependence: Secondary | ICD-10-CM

## 2024-04-11 ENCOUNTER — Other Ambulatory Visit: Payer: Self-pay

## 2024-04-11 ENCOUNTER — Other Ambulatory Visit (HOSPITAL_COMMUNITY): Payer: Self-pay

## 2024-04-11 ENCOUNTER — Other Ambulatory Visit: Payer: Self-pay | Admitting: Psychiatry

## 2024-04-11 ENCOUNTER — Other Ambulatory Visit: Payer: Self-pay | Admitting: Family Medicine

## 2024-04-11 DIAGNOSIS — G8929 Other chronic pain: Secondary | ICD-10-CM

## 2024-04-11 DIAGNOSIS — F3162 Bipolar disorder, current episode mixed, moderate: Secondary | ICD-10-CM

## 2024-04-11 DIAGNOSIS — N63 Unspecified lump in unspecified breast: Secondary | ICD-10-CM

## 2024-04-11 MED ORDER — DIVALPROEX SODIUM ER 250 MG PO TB24
250.0000 mg | ORAL_TABLET | Freq: Every day | ORAL | 0 refills | Status: DC
  Filled 2024-04-11: qty 90, 90d supply, fill #0

## 2024-04-12 ENCOUNTER — Other Ambulatory Visit (HOSPITAL_COMMUNITY): Payer: Self-pay

## 2024-04-12 MED ORDER — MELOXICAM 15 MG PO TABS
15.0000 mg | ORAL_TABLET | Freq: Every day | ORAL | 1 refills | Status: AC
Start: 2024-04-12 — End: ?
  Filled 2024-04-12 – 2024-06-30 (×4): qty 90, 90d supply, fill #0
  Filled 2024-07-27 – 2024-09-09 (×3): qty 90, 90d supply, fill #1

## 2024-04-19 ENCOUNTER — Other Ambulatory Visit: Payer: Self-pay | Admitting: Family Medicine

## 2024-04-19 ENCOUNTER — Other Ambulatory Visit (HOSPITAL_COMMUNITY): Payer: Self-pay

## 2024-04-19 ENCOUNTER — Other Ambulatory Visit: Payer: Self-pay

## 2024-04-19 DIAGNOSIS — R1084 Generalized abdominal pain: Secondary | ICD-10-CM

## 2024-04-19 MED ORDER — PANTOPRAZOLE SODIUM 20 MG PO TBEC
20.0000 mg | DELAYED_RELEASE_TABLET | Freq: Every day | ORAL | 1 refills | Status: DC
  Filled 2024-04-19: qty 30, 30d supply, fill #0
  Filled 2024-05-13: qty 30, 30d supply, fill #1

## 2024-04-20 ENCOUNTER — Encounter: Payer: Self-pay | Admitting: Psychiatry

## 2024-04-20 ENCOUNTER — Telehealth: Admitting: Psychiatry

## 2024-04-20 ENCOUNTER — Other Ambulatory Visit (HOSPITAL_COMMUNITY): Payer: Self-pay

## 2024-04-20 DIAGNOSIS — G2581 Restless legs syndrome: Secondary | ICD-10-CM

## 2024-04-20 DIAGNOSIS — F3176 Bipolar disorder, in full remission, most recent episode depressed: Secondary | ICD-10-CM | POA: Diagnosis not present

## 2024-04-20 DIAGNOSIS — R5383 Other fatigue: Secondary | ICD-10-CM

## 2024-04-20 DIAGNOSIS — F5105 Insomnia due to other mental disorder: Secondary | ICD-10-CM | POA: Diagnosis not present

## 2024-04-20 DIAGNOSIS — F411 Generalized anxiety disorder: Secondary | ICD-10-CM | POA: Diagnosis not present

## 2024-04-20 MED ORDER — DIVALPROEX SODIUM ER 250 MG PO TB24
250.0000 mg | ORAL_TABLET | Freq: Every day | ORAL | 1 refills | Status: AC
  Filled 2024-04-20 – 2024-09-09 (×4): qty 90, 90d supply, fill #0

## 2024-04-20 NOTE — Progress Notes (Signed)
 Virtual Visit via Video Note  I connected with Stacy Chambers on 04/20/24 at  1:20 PM EDT by a video enabled telemedicine application and verified that I am speaking with the correct person using two identifiers.  Location Provider Location : [facility] Patient Location : Home  Participants: Patient , Provider   I discussed the limitations of evaluation and management by telemedicine and the availability of in person appointments. The patient expressed understanding and agreed to proceed.  I discussed the assessment and treatment plan with the patient. The patient was provided an opportunity to ask questions and all were answered. The patient agreed with the plan and demonstrated an understanding of the instructions.   The patient was advised to call back or seek an in-person evaluation if the symptoms worsen or if the condition fails to improve as anticipated.   BH MD OP Progress Note  04/20/2024 4:46 PM SIMAYA LUMADUE  MRN:  [id number]  Chief Complaint:  Chief Complaint  Patient presents with   Follow-up   Anxiety   Mood swings   Medication Refill   HPI: Stacy Chambers is a 58 year old Caucasian female, lives in [place], married, has a history of bipolar disorder, GAD, insomnia, bereavement, RLS, vitamin B12 deficiency was evaluated by telemedicine today.  Patient today reports overall mood symptoms as improved since being back on the Lexapro .  She currently denies any significant shopping sprees, elevated mood, euphoria, agitation or irritability.  She denies any significant anxiety symptoms.  Currently denies any side effects to Lexapro .  She reports she recently struggled for a few nights with regards to her sleep.  She usually struggles with her sleep the nights that her husband has to work.  Last night she was able to sleep for 5 hours.  She does have trazodone  and propranolol available at night however she does not use it every night.  She also does not follow a good sleep  hygiene the nights that her husband has to work.  She also struggles with a lot of fatigue during the day likely due to sleep problems , declined referral for sleep study.  She was recently diagnosed with breast mass and has upcoming mammogram scheduled on July 10.  She denies any appetite changes however reports she may have lost a significant amount of weight in the past several months although currently she is gaining it back.  She denies any suicidality, homicidality or perceptual disturbances.  She is compliant on her Depakote .  Denies side effects.  Denies any other concerns today.  Visit Diagnosis:    ICD-10-CM   1. Bipolar disorder, in full remission, most recent episode depressed (HCC)  F31.76     2. GAD (generalized anxiety disorder)  F41.1     3. Insomnia due to mental disorder  F51.05    Lack of sleep hygiene, mood symptoms, restless leg symptoms, rule out sleep apnea    4. RLS (restless legs syndrome)  G25.81 divalproex  (DEPAKOTE  ER) 250 MG 24 hr tablet    5. Fatigue, unspecified type  R53.83       Past Psychiatric History: I have reviewed past psychiatric history from progress note on 10/12/2018.  Past trials of medications like Geodon , Zyprexa , risperidone , BuSpar , Zoloft , Wellbutrin , Lamictal , carbamazepine , vraylar .  Patient reports 1 suicide attempt after losing her son several years ago.  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   Chronic obstructive pulmonary disease (HCC) 10/03/2015   COPD (chronic obstructive pulmonary disease) (HCC)    no inhalers   Elevated liver function tests 11/26/2004   H/O NEGATIVE LIVER BIOPSY, workup  negative   Gallstones 02/21/2015   GERD (gastroesophageal reflux disease)    Hyperlipidemia    Insomnia    Lower back pain    Osteoarthritis    Ovarian cancer (HCC) 1995   RLS (restless legs syndrome)    Sedative, hypnotic or anxiolytic dependence with withdrawal, 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HCC)    Wears dentures    full upper and lower    Past Surgical History:  Procedure Laterality Date   ABDOMINAL HYSTERECTOMY     APPENDECTOMY     BREAST BIOPSY Left 03/12/2015   fat necrosis   CESAREAN SECTION     COLONOSCOPY WITH PROPOFOL  N/A 05/30/2018   Procedure: COLONOSCOPY WITH PROPOFOL ;  Surgeon: Viktoria Lamar DASEN, MD;  Location: [facility] ENDOSCOPY;  Service: Endoscopy;  Laterality: N/A;   COLONOSCOPY WITH PROPOFOL  N/A 09/20/2023   Procedure: COLONOSCOPY WITH PROPOFOL ;  Surgeon: Cindie Carlin POUR, DO;  Location: [facility];  Service: Endoscopy;  Laterality: N/A;  930am, asa 3   EXCISION MORTON'S NEUROMA Left 10/07/2016   Procedure: EXCISION MORTON'S NEUROMA;  Surgeon: Eva Gay, DPM;  Location: [facility];  Service: Podiatry;  Laterality: Left;  IV WITH LOCAL   LIVER BIOPSY  2005   PerPt: Done to eval elevated LFTs and biopsy provided no definite dx/cause of elevated LFTs   MULTIPLE TOOTH EXTRACTIONS     OOPHORECTOMY     OSTECTOMY Right 04/10/2020   Procedure: DOUBLE OSTEOTOMY GENERAL W/ LOCAL;  Surgeon: Gay Eva, DPM;  Location: [facility];  Service: Podiatry;  Laterality: Right;    Family Psychiatric History: I have reviewed family psychiatric history from progress note on 10/12/2018.  Family History:  Family History  Adopted: Yes  Problem Relation Age of Onset   COPD Mother    Depression Mother    Colon cancer Mother        unsure of age   Cancer Father        Lung CA, Skin Cancer--?type   COPD Father    Stroke Father    Cancer Sister 62       cervical cancer   COPD Brother    Alcohol abuse Brother    Stroke Brother    COPD Maternal Aunt    Depression  Daughter    Schizophrenia Son    Breast cancer Neg Hx     Social History: I have reviewed social history from progress note on 10/12/2018. Social History   Socioeconomic History   Marital status: Married    Spouse name: Not on file   Number of children: Not on file   Years of education: Not on file   Highest education level: 10th grade  Occupational History   Not on file  Tobacco Use   Smoking status: Former    Current packs/day: 0.00    Average packs/day: 1 pack/day for 30.0 years (30.0 ttl pk-yrs)    Types: Cigarettes    Start date: 04/09/1979    Quit date: 04/08/2009    Years since quitting: 15.0   Smokeless  tobacco: Never  Vaping Use   Vaping status: Every Day  Substance and Sexual Activity   Alcohol use: Not Currently    Alcohol/week: 3.0 - 7.0 standard drinks of alcohol    Types: 3 - 7 Shots of liquor per week    Comment: occasionally   Drug use: Not Currently    Types: Marijuana    Comment: told to refrain until after surgery   Sexual activity: Yes    Birth control/protection: Surgical  Other Topics Concern   Not on file  Social History Narrative   Not on file   Social Drivers of Health   Financial Resource Strain: Low Risk  (06/29/2023)   Overall Financial Resource Strain (CARDIA)    Difficulty of Paying Living Expenses: Not hard at all  Food Insecurity: Food Insecurity Present (07/07/2023)   Hunger Vital Sign    Worried About Running Out of Food in the Last Year: Never true    Ran Out of Food in the Last Year: Often true  Transportation Needs: No Transportation Needs (07/07/2023)   PRAPARE - Administrator, Civil Service (Medical): No    Lack of Transportation (Non-Medical): No  Physical Activity: Inactive (06/29/2023)   Exercise Vital Sign    Days of Exercise per Week: 0 days    Minutes of Exercise per Session: 0 min  Stress: No Stress Concern Present (06/29/2023)   [employer] of Occupational Health - Occupational Stress Questionnaire     Feeling of Stress : Not at all  Social Connections: Patient Declined (06/29/2023)   Social Connection and Isolation Panel    Frequency of Communication with Friends and Family: Patient declined    Frequency of Social Gatherings with Friends and Family: Patient declined    Attends Religious Services: Patient declined    Database administrator or Organizations: Patient declined    Attends Banker Meetings: Patient declined    Marital Status: Patient declined    Allergies:  Allergies  Allergen Reactions   Benadryl [Diphenhydramine Hcl]     Hives/ rapid heartrate   Codeine      dizziness   Vicodin [Hydrocodone-Acetaminophen ] Other (See Comments)    dizziness    Metabolic Disorder Labs: Lab Results  Component Value Date   HGBA1C 5.2 09/10/2023   MPG 114 09/14/2018   No results found for: PROLACTIN Lab Results  Component Value Date   CHOL 138 09/10/2023   TRIG 147 09/10/2023   HDL 41 09/10/2023   CHOLHDL 3.4 09/10/2023   VLDL 18 03/04/2015   LDLCALC 71 09/10/2023   LDLCALC 83 10/30/2022   Lab Results  Component Value Date   TSH 1.250 09/10/2023   TSH 1.700 10/30/2022    Therapeutic Level Labs: Lab Results  Component Value Date   LITHIUM  0.3 (L) 10/24/2018   LITHIUM  0.49 (L) 01/13/2009   Lab Results  Component Value Date   VALPROATE 71 12/30/2023   VALPROATE 70 11/11/2022   No results found for: CBMZ  Current Medications: Current Outpatient Medications  Medication Sig Dispense Refill   divalproex  (DEPAKOTE  ER) 250 MG 24 hr tablet Take 1 tablet (250 mg total) by mouth daily. Take along with 1000 mg daily , total of 1250 mg. 90 tablet 1   divalproex  (DEPAKOTE  ER) 500 MG 24 hr tablet Take 2 tablets (1,000 mg total) by mouth daily. Take along with 250 mg daily - total of 1250 mg 180 tablet 0   escitalopram  (LEXAPRO ) 20 MG tablet Take 20 mg by  mouth daily.     esomeprazole  (NEXIUM ) 20 MG capsule Take 1 capsule (20 mg total) by mouth daily as needed.  Take on empty stomach. 90 capsule 1   fluticasone  (FLONASE ) 50 MCG/ACT nasal spray Place 2 sprays into both nostrils daily. 16 g 2   meloxicam  (MOBIC ) 15 MG tablet Take 1 tablet (15 mg total) by mouth daily. 90 tablet 1   methocarbamol  (ROBAXIN ) 500 MG tablet Take 1 tablet (500 mg total) by mouth daily. 60 tablet 5   pantoprazole  (PROTONIX ) 20 MG tablet Take 1 tablet (20 mg total) by mouth daily. 30 tablet 1   rOPINIRole  (REQUIP ) 0.25 MG tablet Take 1 tablet (0.25 mg total) by mouth at bedtime. 30 tablet 1   rosuvastatin  (CRESTOR ) 10 MG tablet Take 1 tablet (10 mg total) by mouth daily. 90 tablet 1   traZODone  (DESYREL ) 100 MG tablet Take 1-2 tablets (100-200 mg total) by mouth at bedtime as needed, for sleep. 180 tablet 1   No current facility-administered medications for this visit.     Musculoskeletal: Strength & Muscle Tone: UTA Gait & Station: Seated Patient leans: N/A  Psychiatric Specialty Exam: Review of Systems  Psychiatric/Behavioral:  Positive for sleep disturbance.     There were no vitals taken for this visit.There is no height or weight on file to calculate BMI.  General Appearance: Casual  Eye Contact:  Fair  Speech:  Clear and Coherent  Volume:  Normal  Mood:  Euthymic  Affect:  Congruent  Thought Process:  Goal Directed and Descriptions of Associations: Intact  Orientation:  Full (Time, Place, and Person)  Thought Content: Logical   Suicidal Thoughts:  No  Homicidal Thoughts:  No  Memory:  Immediate;   Fair Recent;   Fair Remote;   Fair  Judgement:  Fair  Insight:  Fair  Psychomotor Activity:  Normal  Concentration:  Concentration: Fair and Attention Span: Fair  Recall:  [employer] of Knowledge: Fair  Language: Fair  Akathisia:  No  Handed:  Left  AIMS (if indicated): not done  Assets:  Communication Skills Desire for Improvement Housing Intimacy Social Support Transportation  ADL's:  Intact  Cognition: WNL  Sleep:  Poor   Screenings: AIMS     Flowsheet Row Video Visit from 04/06/2023 in [facility] Office Visit from 11/23/2022 in [facility] Video Visit from 06/01/2022 in [facility] Video Visit from 03/24/2022 in [facility] Video Visit from 06/04/2021 in [facility]  AIMS Total Score 0 0 0 0 0   GAD-7    Flowsheet Row Office Visit from 03/10/2024 in [facility] Office Visit from 01/18/2024 in [facility] Office Visit from 06/10/2023 in [facility] Office Visit from 05/11/2023 in [facility] Office Visit from 04/05/2023 in [facility]  Total GAD-7 Score 1 7 0 0 7   PHQ2-9    Flowsheet Row Office Visit from 03/10/2024 in [facility] Office Visit from 01/18/2024 in [facility] Clinical Support from 06/29/2023 in [facility] Office Visit from 06/10/2023 in [facility] Office Visit from 05/11/2023 in [facility] Care  PHQ-2 Total Score 0 1 1 3  0  PHQ-9 Total Score 0 -- 3 11 0   Flowsheet  Row Video Visit from 04/20/2024 in [facility] Office Visit from 01/18/2024 in [facility] ED from 12/22/2023 in [facility] Emergency Department at [facility]  C-SSRS RISK CATEGORY Moderate Risk Moderate Risk No Risk     Assessment and Plan: SHERLEEN PANGBORN is a 58 year old Caucasian female, married, lives in [place], has a history of bipolar disorder, anxiety disorder, COPD, RLS was evaluated by telemedicine today.  Discussed assessment and plan as noted below.  Bipolar disorder in remission Currently  reports mood symptoms are stable although does struggle with sleep mostly because of lack of sleep hygiene and noncompliance with sleep medication. Continue Depakote  ER 1250 mg daily (Depakote  level dated 12/30/2023-71)  Generalized anxiety disorder-stable Currently reports anxiety symptoms as managed on the Lexapro . Continue Lexapro  20 mg daily  Insomnia/RLS/Fatigue-unstable Currently struggling with sleep problems mostly due to lack of sleep hygiene and noncompliance with medication. Encouraged compliance with medications as prescribed Continue Ropinirole  0.25 mg at bedtime Continue Trazodone  100-200 mg at bedtime as needed Discussed sleep hygiene techniques including setting up reminders for bedtime and wake up time. Discussed referral for sleep study, patient declines.  Follow-up Follow-up in clinic in 8 to 10 weeks or sooner in person.  Consent: Patient/Guardian gives verbal consent for treatment and assignment of benefits for services provided during this visit. Patient/Guardian expressed understanding and agreed to proceed.   This note was generated in part or whole with voice recognition software. Voice recognition is usually quite accurate but there are transcription errors that can and very often do occur. I apologize for any typographical errors that were not detected and corrected.    Lalonnie Shaffer, MD 04/20/2024, 4:46 PM

## 2024-04-27 ENCOUNTER — Encounter (HOSPITAL_COMMUNITY)

## 2024-04-27 ENCOUNTER — Ambulatory Visit (HOSPITAL_COMMUNITY)

## 2024-05-04 ENCOUNTER — Other Ambulatory Visit: Payer: Self-pay

## 2024-05-04 ENCOUNTER — Ambulatory Visit (HOSPITAL_COMMUNITY)
Admission: RE | Admit: 2024-05-04 | Discharge: 2024-05-04 | Disposition: A | Discharge status: Home / Self Care | Source: Ambulatory Visit

## 2024-05-04 ENCOUNTER — Ambulatory Visit: Payer: Self-pay

## 2024-05-04 ENCOUNTER — Encounter (HOSPITAL_COMMUNITY): Payer: Self-pay

## 2024-05-04 DIAGNOSIS — N6452 Nipple discharge: Secondary | ICD-10-CM | POA: Diagnosis not present

## 2024-05-04 DIAGNOSIS — N63 Unspecified lump in unspecified breast: Secondary | ICD-10-CM | POA: Diagnosis present

## 2024-05-04 DIAGNOSIS — N6342 Unspecified lump in left breast, subareolar: Secondary | ICD-10-CM | POA: Insufficient documentation

## 2024-05-04 DIAGNOSIS — R92323 Mammographic fibroglandular density, bilateral breasts: Secondary | ICD-10-CM | POA: Diagnosis not present

## 2024-05-04 DIAGNOSIS — R92333 Mammographic heterogeneous density, bilateral breasts: Secondary | ICD-10-CM | POA: Diagnosis not present

## 2024-05-04 DIAGNOSIS — R928 Other abnormal and inconclusive findings on diagnostic imaging of breast: Secondary | ICD-10-CM

## 2024-05-12 ENCOUNTER — Other Ambulatory Visit (HOSPITAL_COMMUNITY)

## 2024-05-13 ENCOUNTER — Other Ambulatory Visit (HOSPITAL_COMMUNITY): Payer: Self-pay

## 2024-05-13 ENCOUNTER — Other Ambulatory Visit: Payer: Self-pay | Admitting: Psychiatry

## 2024-05-13 DIAGNOSIS — F411 Generalized anxiety disorder: Secondary | ICD-10-CM

## 2024-05-14 MED ORDER — DIVALPROEX SODIUM ER 500 MG PO TB24
1000.0000 mg | ORAL_TABLET | Freq: Every day | ORAL | 0 refills | Status: DC
  Filled 2024-05-14 – 2024-07-27 (×2): qty 180, 90d supply, fill #0

## 2024-05-15 ENCOUNTER — Other Ambulatory Visit: Payer: Self-pay

## 2024-05-15 ENCOUNTER — Other Ambulatory Visit (HOSPITAL_COMMUNITY): Payer: Self-pay

## 2024-06-09 ENCOUNTER — Other Ambulatory Visit: Payer: Self-pay | Admitting: Psychiatry

## 2024-06-09 ENCOUNTER — Other Ambulatory Visit (HOSPITAL_COMMUNITY): Payer: Self-pay

## 2024-06-09 ENCOUNTER — Other Ambulatory Visit: Payer: Self-pay

## 2024-06-09 DIAGNOSIS — F5105 Insomnia due to other mental disorder: Secondary | ICD-10-CM

## 2024-06-09 DIAGNOSIS — G2581 Restless legs syndrome: Secondary | ICD-10-CM

## 2024-06-11 MED ORDER — ROPINIROLE HCL 0.25 MG PO TABS
0.2500 mg | ORAL_TABLET | Freq: Every day | ORAL | 1 refills | Status: DC
  Filled 2024-06-11 – 2024-06-23 (×2): qty 30, 30d supply, fill #0
  Filled 2024-07-27: qty 30, 30d supply, fill #1

## 2024-06-12 ENCOUNTER — Other Ambulatory Visit (HOSPITAL_COMMUNITY): Payer: Self-pay

## 2024-06-12 ENCOUNTER — Other Ambulatory Visit: Payer: Self-pay

## 2024-06-13 ENCOUNTER — Other Ambulatory Visit: Payer: Self-pay

## 2024-06-13 ENCOUNTER — Telehealth: Payer: Self-pay | Admitting: Family Medicine

## 2024-06-13 ENCOUNTER — Encounter: Payer: Self-pay | Admitting: Pharmacist

## 2024-06-13 NOTE — Telephone Encounter (Signed)
 Copied from CRM [id number]. Topic: General - Other >> Jun 13, 2024  1:37 PM Wess RAMAN wrote: Reason for CRM: Lesley, scheduler from [facility] like to confirm if the patient has breast cancer and if so, they would need supporting documents.  Callback #: [phone] Fax #: [phone]

## 2024-06-15 ENCOUNTER — Other Ambulatory Visit: Payer: Self-pay

## 2024-06-19 ENCOUNTER — Telehealth: Payer: Self-pay

## 2024-06-19 NOTE — Telephone Encounter (Signed)
 Copied from CRM #[id number]. Topic: General - Other >> Jun 19, 2024  1:00 PM Stacy Chambers wrote: Reason for CRM: [facility] stated that they dont do surgery or schedule for MRI's so they going to need to close the referral for cancer because she hasn't been diagnosed with cancer.

## 2024-06-21 ENCOUNTER — Encounter: Payer: Self-pay | Admitting: Pharmacist

## 2024-06-21 ENCOUNTER — Other Ambulatory Visit: Payer: Self-pay

## 2024-06-21 ENCOUNTER — Other Ambulatory Visit (HOSPITAL_COMMUNITY): Payer: Self-pay

## 2024-06-23 ENCOUNTER — Other Ambulatory Visit (HOSPITAL_COMMUNITY): Payer: Self-pay

## 2024-06-23 ENCOUNTER — Other Ambulatory Visit: Payer: Self-pay

## 2024-06-23 ENCOUNTER — Other Ambulatory Visit: Payer: Self-pay | Admitting: Family Medicine

## 2024-06-23 DIAGNOSIS — R1084 Generalized abdominal pain: Secondary | ICD-10-CM

## 2024-06-23 MED ORDER — PANTOPRAZOLE SODIUM 20 MG PO TBEC
20.0000 mg | DELAYED_RELEASE_TABLET | Freq: Every day | ORAL | 1 refills | Status: DC
  Filled 2024-06-23: qty 30, 30d supply, fill #0
  Filled 2024-08-16: qty 30, 30d supply, fill #1

## 2024-06-27 ENCOUNTER — Other Ambulatory Visit: Payer: Self-pay

## 2024-06-28 ENCOUNTER — Encounter: Payer: Self-pay | Admitting: Psychiatry

## 2024-06-28 ENCOUNTER — Ambulatory Visit (INDEPENDENT_AMBULATORY_CARE_PROVIDER_SITE_OTHER): Admitting: Psychiatry

## 2024-06-28 VITALS — BP 118/78 | HR 73 | Temp 98.3°F | Ht 64.0 in | Wt 133.4 lb

## 2024-06-28 DIAGNOSIS — F5105 Insomnia due to other mental disorder: Secondary | ICD-10-CM

## 2024-06-28 DIAGNOSIS — F3176 Bipolar disorder, in full remission, most recent episode depressed: Secondary | ICD-10-CM

## 2024-06-28 DIAGNOSIS — F411 Generalized anxiety disorder: Secondary | ICD-10-CM | POA: Diagnosis not present

## 2024-06-28 DIAGNOSIS — G2581 Restless legs syndrome: Secondary | ICD-10-CM

## 2024-06-28 NOTE — Progress Notes (Unsigned)
 [facility] MD OP Progress Note  06/28/2024 11:40 AM Stacy Chambers  MRN:  [id number]  Chief Complaint:  Chief Complaint  Patient presents with   Follow-up   Anxiety   Depression   Medication Refill   Insomnia   HPI: Stacy Chambers is a 58 year old Caucasian female, lives in [place], married, has a history of bipolar disorder, GAD, insomnia, bereavement, RLS, vitamin B12 deficiency was evaluated in office today.  Currently reports overall mood symptoms as stable.  However continues to struggle with sleep.  Reports that when her husband is home she is able to sleep through the night.  The nights that she is alone she is worried about going into her bedroom, recently has had these feelings of being haunted by her ex-husband who passed away.  She reports she may have heard the doors creak as well as a pressure on her bed when alone.  This does not happen when her husband is home with her.  Unknown if true psychosis.  She is not interested in medication changes at this time when antipsychotics were discussed with her.  She will let this provider know if interested.  She currently does report fatigue throughout the day.  that does limit her ability to function and do the things that she enjoys.  She also reports hair loss.  She is currently trying to work this up with her primary care provider.  She currently denies any suicidality or homicidality.  She is compliant on her medications.  Not sure if her medications are causing her fatigue and hair loss.  She is not interested in changing any medications or lowering the dosage of Depakote  since she is worried of decompensation.  Denies any other concerns today.  Visit Diagnosis:    ICD-10-CM   1. Bipolar disorder, in full remission, most recent episode depressed (HCC)  F31.76     2. GAD (generalized anxiety disorder)  F41.1     3. Insomnia due to mental disorder  F51.05    lack of sleep hygiene , anxiety    4. RLS (restless legs syndrome)   G25.81       Past Psychiatric History: Had reviewed past psychiatric history from progress note on 10/12/2018.  Past trials of medications like Geodon , Zyprexa , risperidone , BuSpar , Zoloft , Wellbutrin , Lamictal , carbamazepine , Vraylar .  Patient reports 1 suicide attempt after losing her son several years ago.  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   Chronic obstructive pulmonary disease (HCC) 10/03/2015   COPD (chronic obstructive pulmonary disease) (HCC)    no inhalers   Elevated liver function tests 11/26/2004   H/O NEGATIVE LIVER BIOPSY, workup negative   Gallstones 02/21/2015   GERD (gastroesophageal reflux disease)    Hyperlipidemia    Insomnia    Lower back pain    Osteoarthritis    Ovarian cancer (HCC) 1995   RLS (restless legs syndrome)    Sedative, hypnotic or anxiolytic dependence with withdrawal, 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HCC)    Wears dentures    full upper and lower    Past Surgical History:  Procedure Laterality Date   ABDOMINAL HYSTERECTOMY  APPENDECTOMY     BREAST BIOPSY Right 03/12/2015   fat necrosis   CESAREAN SECTION     COLONOSCOPY WITH PROPOFOL  N/A 05/30/2018   Procedure: COLONOSCOPY WITH PROPOFOL ;  Surgeon: Viktoria Lamar DASEN, MD;  Location: [facility] ENDOSCOPY;  Service: Endoscopy;  Laterality: N/A;   COLONOSCOPY WITH PROPOFOL  N/A 09/20/2023   Procedure: COLONOSCOPY WITH PROPOFOL ;  Surgeon: Cindie Carlin POUR, DO;  Location: [facility];  Service: Endoscopy;  Laterality: N/A;  930am, asa 3   EXCISION MORTON'S NEUROMA Left 10/07/2016   Procedure: EXCISION MORTON'S NEUROMA;  Surgeon: Eva Gay, DPM;  Location: [facility];  Service:  Podiatry;  Laterality: Left;  IV WITH LOCAL   LIVER BIOPSY  2005   PerPt: Done to eval elevated LFTs and biopsy provided no definite dx/cause of elevated LFTs   MULTIPLE TOOTH EXTRACTIONS     OOPHORECTOMY     OSTECTOMY Right 04/10/2020   Procedure: DOUBLE OSTEOTOMY GENERAL W/ LOCAL;  Surgeon: Gay Eva, DPM;  Location: [facility];  Service: Podiatry;  Laterality: Right;    Family Psychiatric History: I have reviewed family psychiatric history from progress note on 10/12/2018.  Family History:  Family History  Adopted: Yes  Problem Relation Age of Onset   COPD Mother    Depression Mother    Colon cancer Mother        unsure of age   Cancer Father        Lung CA, Skin Cancer--?type   COPD Father    Stroke Father    Cancer Sister 55       cervical cancer   COPD Brother    Alcohol abuse Brother    Stroke Brother    COPD Maternal Aunt    Depression Daughter    Schizophrenia Son    Breast cancer Neg Hx     Social History: I have reviewed social history from progress note on 10/12/2018. Social History   Socioeconomic History   Marital status: Married    Spouse name: Not on file   Number of children: Not on file   Years of education: Not on file   Highest education level: 10th grade  Occupational History   Not on file  Tobacco Use   Smoking status: Former    Current packs/day: 0.00    Average packs/day: 1 pack/day for 30.0 years (30.0 ttl pk-yrs)    Types: Cigarettes    Start date: 04/09/1979    Quit date: 04/08/2009    Years since quitting: 15.2   Smokeless tobacco: Never  Vaping Use   Vaping status: Every Day  Substance and Sexual Activity   Alcohol use: Not Currently    Alcohol/week: 3.0 - 7.0 standard drinks of alcohol    Types: 3 - 7 Shots of liquor per week    Comment: occasionally   Drug use: Not Currently    Types: Marijuana    Comment: told to refrain until after surgery   Sexual activity: Yes    Birth control/protection: Surgical  Other  Topics Concern   Not on file  Social History Narrative   Not on file   Social Drivers of Health   Financial Resource Strain: Low Risk  (06/29/2023)   Overall Financial Resource Strain (CARDIA)    Difficulty of Paying Living Expenses: Not hard at all  Food Insecurity: Food Insecurity Present (07/07/2023)   Hunger Vital Sign    Worried About Running Out of Food in the Last Year: Never true    Ran Out  of Food in the Last Year: Often true  Transportation Needs: No Transportation Needs (07/07/2023)   PRAPARE - Administrator, Civil Service (Medical): No    Lack of Transportation (Non-Medical): No  Physical Activity: Inactive (06/29/2023)   Exercise Vital Sign    Days of Exercise per Week: 0 days    Minutes of Exercise per Session: 0 min  Stress: No Stress Concern Present (06/29/2023)   [employer] of Occupational Health - Occupational Stress Questionnaire    Feeling of Stress : Not at all  Social Connections: Patient Declined (06/29/2023)   Social Connection and Isolation Panel    Frequency of Communication with Friends and Family: Patient declined    Frequency of Social Gatherings with Friends and Family: Patient declined    Attends Religious Services: Patient declined    Database administrator or Organizations: Patient declined    Attends Banker Meetings: Patient declined    Marital Status: Patient declined    Allergies:  Allergies  Allergen Reactions   Benadryl [Diphenhydramine Hcl]     Hives/ rapid heartrate   Codeine      dizziness   Vicodin [Hydrocodone-Acetaminophen ] Other (See Comments)    dizziness    Metabolic Disorder Labs: Lab Results  Component Value Date   HGBA1C 5.2 09/10/2023   MPG 114 09/14/2018   No results found for: PROLACTIN Lab Results  Component Value Date   CHOL 138 09/10/2023   TRIG 147 09/10/2023   HDL 41 09/10/2023   CHOLHDL 3.4 09/10/2023   VLDL 18 03/04/2015   LDLCALC 71 09/10/2023   LDLCALC 83 10/30/2022    Lab Results  Component Value Date   TSH 1.250 09/10/2023   TSH 1.700 10/30/2022    Therapeutic Level Labs: Lab Results  Component Value Date   LITHIUM  0.3 (L) 10/24/2018   LITHIUM  0.49 (L) 01/13/2009   Lab Results  Component Value Date   VALPROATE 71 12/30/2023   VALPROATE 70 11/11/2022   No results found for: CBMZ  Current Medications: Current Outpatient Medications  Medication Sig Dispense Refill   divalproex  (DEPAKOTE  ER) 250 MG 24 hr tablet Take 1 tablet (250 mg total) by mouth daily. Take along with 1000 mg daily , total of 1250 mg. 90 tablet 1   divalproex  (DEPAKOTE  ER) 500 MG 24 hr tablet Take 2 tablets (1,000 mg total) by mouth daily. (Take along with 250 mg daily - total of 1250 mg) 180 tablet 0   escitalopram  (LEXAPRO ) 20 MG tablet Take 20 mg by mouth daily.     esomeprazole  (NEXIUM ) 20 MG capsule Take 1 capsule (20 mg total) by mouth daily as needed. Take on empty stomach. 90 capsule 1   fluticasone  (FLONASE ) 50 MCG/ACT nasal spray Place 2 sprays into both nostrils daily. 16 g 2   meloxicam  (MOBIC ) 15 MG tablet Take 1 tablet (15 mg total) by mouth daily. 90 tablet 1   methocarbamol  (ROBAXIN ) 500 MG tablet Take 1 tablet (500 mg total) by mouth daily. 60 tablet 5   pantoprazole  (PROTONIX ) 20 MG tablet Take 1 tablet (20 mg total) by mouth daily. 30 tablet 1   rOPINIRole  (REQUIP ) 0.25 MG tablet Take 1 tablet (0.25 mg total) by mouth at bedtime. 30 tablet 1   rosuvastatin  (CRESTOR ) 10 MG tablet Take 1 tablet (10 mg total) by mouth daily. 90 tablet 1   traZODone  (DESYREL ) 100 MG tablet Take 1-2 tablets (100-200 mg total) by mouth at bedtime as needed, for sleep. 180 tablet  1   docusate sodium  (COLACE) 100 MG capsule Take 1 capsule (100 mg total) by mouth 2 (two) times daily. 30 capsule 0   No current facility-administered medications for this visit.     Musculoskeletal: Strength & Muscle Tone: within normal limits Gait & Station: normal Patient leans:  N/A  Psychiatric Specialty Exam: Review of Systems  Psychiatric/Behavioral:  Positive for sleep disturbance. The patient is nervous/anxious.     Blood pressure 118/78, pulse 73, temperature 98.3 F (36.8 C), temperature source Temporal, height 5' 4 (1.626 m), weight 133 lb 6.4 oz (60.5 kg), SpO2 100%.Body mass index is 22.9 kg/m.  General Appearance: Casual  Eye Contact:  Fair  Speech:  Clear and Coherent  Volume:  Normal  Mood:  Anxious  Affect:  Congruent  Thought Process:  Goal Directed and Descriptions of Associations: Intact  Orientation:  Full (Time, Place, and Person)  Thought Content: Logical does report she feels her dead husband trying to spook her when she is home alone , coping ok, not interested in medications for the same, she feels ok when her current husband is with her  Suicidal Thoughts:  No  Homicidal Thoughts:  No  Memory:  Immediate;   Fair Recent;   Fair Remote;   Fair  Judgement:  Fair  Insight:  Fair  Psychomotor Activity:  Normal  Concentration:  Concentration: Fair and Attention Span: Fair  Recall:  [employer] of Knowledge: Fair  Language: Fair  Akathisia:  No  Handed:  Left  AIMS (if indicated): not done  Assets:  Communication Skills Desire for Improvement Housing Social Support Transportation  ADL's:  Intact  Cognition: WNL  Sleep:  varies   Screenings: AIMS    Flowsheet Row Video Visit from 04/06/2023 in [facility] Office Visit from 11/23/2022 in [facility] Video Visit from 06/01/2022 in [facility] Video Visit from 03/24/2022 in [facility] Video Visit from 06/04/2021 in [facility]  AIMS Total Score 0 0 0 0 0   GAD-7    Flowsheet Row Office Visit from 06/28/2024 in [facility] Office Visit from  03/10/2024 in [facility] Office Visit from 01/18/2024 in [facility] Office Visit from 06/10/2023 in [facility] Office Visit from 05/11/2023 in [facility]  Total GAD-7 Score 6 1 7  0 0   PHQ2-9    Flowsheet Row Office Visit from 06/28/2024 in [facility] Office Visit from 03/10/2024 in [facility] Office Visit from 01/18/2024 in [facility] Clinical Support from 06/29/2023 in [facility] Office Visit from 06/10/2023 in [facility]  PHQ-2 Total Score 3 0 1 1 3   PHQ-9 Total Score 6 0 -- 3 11   Flowsheet Row Office Visit from 06/28/2024 in [facility] Video Visit from 04/20/2024 in [facility] Office Visit from 01/18/2024 in [facility]  C-SSRS RISK CATEGORY Moderate Risk Moderate Risk Moderate Risk     Assessment and Plan: KEHINDE TOTZKE is a 58 year old Caucasian female, has a history of bipolar disorder, anxiety, RLS, fatigue was evaluated in office today.  Discussed assessment and plan as noted below.  Bipolar disorder in remission Currently  denies any significant depression or mania.  Does report fatigue and hair loss, unknown if this is due to her medications like Depakote .  She is not interested in changing the dosage. Continue Depakote  ER 1250 mg daily (Depakote  level dated 12/30/2023-71-therapeutic) Not interested in reducing the dosage of Depakote  to address her current symptoms of fatigue, hair loss.  Generalized anxiety disorder-stable Currently reports anxiety is manageable Continue Lexapro  20 mg daily  Insomnia/RLS/fatigue-improving Currently sleep problems only when she is alone at home. Discussed sleep  hygiene techniques, sleeping in another room other than her bedroom when she is home alone. Discussed referral for sleep study to rule out sleep apnea.  She declines Continue Requip  0.25 mg at bedtime Continue Trazodone  100-200 mg at bedtime as needed.  Encouraged to take it regularly.  Follow-up Follow-up in clinic in 3 months or sooner if needed.   Collaboration of Care: Collaboration of Care: Primary Care Provider AEB encouraged to follow-up with primary care provider for evaluation of fatigue, hair loss.  Patient/Guardian was advised Release of Information must be obtained prior to any record release in order to collaborate their care with an outside provider. Patient/Guardian was advised if they have not already done so to contact the registration department to sign all necessary forms in order for us  to release information regarding their care.   Consent: Patient/Guardian gives verbal consent for treatment and assignment of benefits for services provided during this visit. Patient/Guardian expressed understanding and agreed to proceed.   This note was generated in part or whole with voice recognition software. Voice recognition is usually quite accurate but there are transcription errors that can and very often do occur. I apologize for any typographical errors that were not detected and corrected.    Georgena Weisheit, MD 06/29/2024, 12:44 PM

## 2024-06-28 NOTE — Progress Notes (Unsigned)
 error

## 2024-06-28 NOTE — Patient Instructions (Signed)
 Insomnia Insomnia is a sleep disorder that makes it difficult to fall asleep or stay asleep. Insomnia can cause fatigue, low energy, difficulty concentrating, mood swings, and poor performance at work or school. There are three different ways to classify insomnia: Difficulty falling asleep. Difficulty staying asleep. Waking up too early in the morning. Any type of insomnia can be long-term (chronic) or short-term (acute). Both are common. Short-term insomnia usually lasts for 3 months or less. Chronic insomnia occurs at least three times a week for longer than 3 months. What are the causes? Insomnia may be caused by another condition, situation, or substance, such as: Having certain mental health conditions, such as anxiety and depression. Using caffeine, alcohol , tobacco, or drugs. Having gastrointestinal conditions, such as gastroesophageal reflux disease (GERD). Having certain medical conditions. These include: Asthma. Alzheimer's disease. Stroke. Chronic pain. An overactive thyroid  gland (hyperthyroidism). Other sleep disorders, such as restless legs syndrome and sleep apnea. Menopause. Sometimes, the cause of insomnia may not be known. What increases the risk? Risk factors for insomnia include: Gender. Females are affected more often than males. Age. Insomnia is more common as people get older. Stress and certain medical and mental health conditions. Lack of exercise. Having an irregular work schedule. This may include working night shifts and traveling between different time zones. What are the signs or symptoms? If you have insomnia, the main symptom is having trouble falling asleep or having trouble staying asleep. This may lead to other symptoms, such as: Feeling tired or having low energy. Feeling nervous about going to sleep. Not feeling rested in the morning. Having trouble concentrating. Feeling irritable, anxious, or depressed. How is this diagnosed? This condition  may be diagnosed based on: Your symptoms and medical history. Your health care provider may ask about: Your sleep habits. Any medical conditions you have. Your mental health. A physical exam. How is this treated? Treatment for insomnia depends on the cause. Treatment may focus on treating an underlying condition that is causing the insomnia. Treatment may also include: Medicines to help you sleep. Counseling or therapy. Lifestyle adjustments to help you sleep better. Follow these instructions at home: Eating and drinking  Limit or avoid alcohol , caffeinated beverages, and products that contain nicotine and tobacco, especially close to bedtime. These can disrupt your sleep. Do not eat a large meal or eat spicy foods right before bedtime. This can lead to digestive discomfort that can make it hard for you to sleep. Sleep habits  Keep a sleep diary to help you and your health care provider figure out what could be causing your insomnia. Write down: When you sleep. When you wake up during the night. How well you sleep and how rested you feel the next day. Any side effects of medicines you are taking. What you eat and drink. Make your bedroom a dark, comfortable place where it is easy to fall asleep. Put up shades or blackout curtains to block light from outside. Use a white noise machine to block noise. Keep the temperature cool. Limit screen use before bedtime. This includes: Not watching TV. Not using your smartphone, tablet, or computer. Stick to a routine that includes going to bed and waking up at the same times every day and night. This can help you fall asleep faster. Consider making a quiet activity, such as reading, part of your nighttime routine. Try to avoid taking naps during the day so that you sleep better at night. Get out of bed if you are still awake after  15 minutes of trying to sleep. Keep the lights down, but try reading or doing a quiet activity. When you feel  sleepy, go back to bed. General instructions Take over-the-counter and prescription medicines only as told by your health care provider. Exercise regularly as told by your health care provider. However, avoid exercising in the hours right before bedtime. Use relaxation techniques to manage stress. Ask your health care provider to suggest some techniques that may work well for you. These may include: Breathing exercises. Routines to release muscle tension. Visualizing peaceful scenes. Make sure that you drive carefully. Do not drive if you feel very sleepy. Keep all follow-up visits. This is important. Contact a health care provider if: You are tired throughout the day. You have trouble in your daily routine due to sleepiness. You continue to have sleep problems, or your sleep problems get worse. Get help right away if: You have thoughts about hurting yourself or someone else. Get help right away if you feel like you may hurt yourself or others, or have thoughts about taking your own life. Go to your nearest emergency room or: Call 911. Call the National Suicide Prevention Lifeline at 2232757840 or 988. This is open 24 hours a day. Text the Crisis Text Line at 657-529-4371. Summary Insomnia is a sleep disorder that makes it difficult to fall asleep or stay asleep. Insomnia can be long-term (chronic) or short-term (acute). Treatment for insomnia depends on the cause. Treatment may focus on treating an underlying condition that is causing the insomnia. Keep a sleep diary to help you and your health care provider figure out what could be causing your insomnia. This information is not intended to replace advice given to you by your health care provider. Make sure you discuss any questions you have with your health care provider. Document Revised: 09/22/2021 Document Reviewed: 09/22/2021 Elsevier Patient Education  2024 [employer].

## 2024-06-29 ENCOUNTER — Ambulatory Visit: Payer: HMO | Admitting: Family Medicine

## 2024-06-29 ENCOUNTER — Other Ambulatory Visit: Payer: Self-pay

## 2024-06-29 ENCOUNTER — Other Ambulatory Visit (HOSPITAL_COMMUNITY): Payer: Self-pay

## 2024-06-29 ENCOUNTER — Telehealth (INDEPENDENT_AMBULATORY_CARE_PROVIDER_SITE_OTHER): Admitting: Family Medicine

## 2024-06-29 DIAGNOSIS — E038 Other specified hypothyroidism: Secondary | ICD-10-CM

## 2024-06-29 DIAGNOSIS — E7849 Other hyperlipidemia: Secondary | ICD-10-CM | POA: Diagnosis not present

## 2024-06-29 DIAGNOSIS — E559 Vitamin D deficiency, unspecified: Secondary | ICD-10-CM

## 2024-06-29 DIAGNOSIS — L659 Nonscarring hair loss, unspecified: Secondary | ICD-10-CM

## 2024-06-29 DIAGNOSIS — R7301 Impaired fasting glucose: Secondary | ICD-10-CM | POA: Diagnosis not present

## 2024-06-29 DIAGNOSIS — K5909 Other constipation: Secondary | ICD-10-CM | POA: Diagnosis not present

## 2024-06-29 MED ORDER — DOCUSATE SODIUM 100 MG PO CAPS
100.0000 mg | ORAL_CAPSULE | Freq: Two times a day (BID) | ORAL | 0 refills | Status: DC
  Filled 2024-06-29: qty 30, 15d supply, fill #0

## 2024-06-29 NOTE — Patient Instructions (Addendum)
 I appreciate the opportunity to provide care to you today!    Follow up:  5 months  Labs: please stop by the lab today to get your blood drawn (CBC, CMP, TSH, Lipid profile, HgA1c, Vit D)  For a Healthier YOU, I Recommend: Reducing your intake of sugar, sodium, carbohydrates, and saturated fats. Increasing your fiber intake by incorporating more whole grains, fruits, and vegetables into your meals. Setting healthy goals with a focus on lowering your consumption of carbs, sugar, and unhealthy fats. Adding variety to your diet by including a wide range of fruits and vegetables. Cutting back on soda and limiting processed foods as much as possible. Staying active: In addition to taking your weight loss medication, aim for at least 150 minutes of moderate-intensity physical activity each week for optimal results.     Ankles swelling I recommend daily elevation of the legs for 20-30 minutes above the level of the heart. Additionally, daily use of compression stockings is advised, along with regular exercise and weight reduction. It's also important to refrain from prolonged sitting or standing to alleviate symptoms.    Please follow up if your symptoms worsen or fail to improve.    Please continue to a heart-healthy diet and increase your physical activities. Try to exercise for at least five days a week.    It was a pleasure to see you and I look forward to continuing to work together on your health and well-being. Please do not hesitate to call the office if you need care or have questions about your care.  In case of emergency, please visit the Emergency Department for urgent care, or contact our clinic at [phone] to schedule an appointment. We're here to help you!   Have a wonderful day and week. With Gratitude, Ranon Coven MSN, FNP-BC

## 2024-06-29 NOTE — Progress Notes (Signed)
 Virtual Visit via Video Note  I connected with Stacy Chambers on 06/30/24 at  9:00 AM EDT by a video enabled telemedicine application and verified that I am speaking with the correct person using two identifiers.  Patient Location: Home Provider Location: Office/Clinic  I discussed the limitations, risks, security, and privacy concerns of performing an evaluation and management service by video and the availability of in person appointments. I also discussed with the patient that there may be a patient responsible charge related to this service. The patient expressed understanding and agreed to proceed.  Subjective: PCP: Tyara Dassow, FNP  Chief Complaint  Patient presents with   Medication Management   water retention   Alopecia    Thinks Depakote  may have a contraindication to hair loss   Constipation   HPI  Hair loss: Ongoing for the past 3-4 months, with constant shedding. She is currently taking Depakote , which can contribute to hair loss, but reports she cannot discontinue it as she is doing well on her current treatment regimen.  Ankle swelling: Reports ankle swelling, typically occurring after prolonged standing or sitting. Denies chest pain, shortness of breath, or palpitations.  Constipation: Reports frequent constipation over the past few days.  ROS: Per HPI  Current Outpatient Medications:    divalproex  (DEPAKOTE  ER) 250 MG 24 hr tablet, Take 1 tablet (250 mg total) by mouth daily. Take along with 1000 mg daily , total of 1250 mg., Disp: 90 tablet, Rfl: 1   divalproex  (DEPAKOTE  ER) 500 MG 24 hr tablet, Take 2 tablets (1,000 mg total) by mouth daily. (Take along with 250 mg daily - total of 1250 mg), Disp: 180 tablet, Rfl: 0   docusate sodium  (COLACE) 100 MG capsule, Take 1 capsule (100 mg total) by mouth 2 (two) times daily., Disp: 30 capsule, Rfl: 0   escitalopram  (LEXAPRO ) 20 MG tablet, Take 20 mg by mouth daily., Disp: , Rfl:    esomeprazole  (NEXIUM ) 20 MG  capsule, Take 1 capsule (20 mg total) by mouth daily as needed. Take on empty stomach., Disp: 90 capsule, Rfl: 1   fluticasone  (FLONASE ) 50 MCG/ACT nasal spray, Place 2 sprays into both nostrils daily., Disp: 16 g, Rfl: 2   meloxicam  (MOBIC ) 15 MG tablet, Take 1 tablet (15 mg total) by mouth daily., Disp: 90 tablet, Rfl: 1   methocarbamol  (ROBAXIN ) 500 MG tablet, Take 1 tablet (500 mg total) by mouth daily., Disp: 60 tablet, Rfl: 5   Minoxidil  5 % FOAM, Apply Apply 1/2 capful once daily to the scalp in the area of hair thinning, Disp: 60 g, Rfl: 0   pantoprazole  (PROTONIX ) 20 MG tablet, Take 1 tablet (20 mg total) by mouth daily., Disp: 30 tablet, Rfl: 1   rOPINIRole  (REQUIP ) 0.25 MG tablet, Take 1 tablet (0.25 mg total) by mouth at bedtime., Disp: 30 tablet, Rfl: 1   rosuvastatin  (CRESTOR ) 10 MG tablet, Take 1 tablet (10 mg total) by mouth daily., Disp: 90 tablet, Rfl: 1   traZODone  (DESYREL ) 100 MG tablet, Take 1-2 tablets (100-200 mg total) by mouth at bedtime as needed, for sleep., Disp: 180 tablet, Rfl: 1  Observations/Objective: There were no vitals filed for this visit. Physical Exam Patient is well-developed, well-nourished in no acute distress.  Resting comfortably at home.  Head is normocephalic, atraumatic.  No labored breathing.  Speech is clear and coherent with logical content.  Patient is alert and oriented at baseline.   Assessment and Plan: Ankle swelling I recommend daily elevation of  the legs for 20-30 minutes above the level of the heart. Additionally, daily use of compression stockings is advised, along with regular exercise and weight reduction. It's also important to refrain from prolonged sitting or standing to alleviate symptoms.     Hair loss Assessment & Plan: Will initiate therapy on  topical Minoxidil  foam to be applied once daily to help promote hair growth. She is encouraged to apply  capful once daily directly to the scalp, ensuring the scalp is clean and  dry before application, and using her fingertips to gently spread the product. Wash hands after use and allow at least 2-4 hours before shampooing or going to bed to allow for absorption. Common side effects include irritation, dryness, itching, or flaking. Do not apply to broken or infected scalp skin. Continue treatment for at least 4 months  Orders: -     Minoxidil ; Apply Apply 1/2 capful once daily to the scalp in the area of hair thinning  Dispense: 60 g; Refill: 0  Other constipation Assessment & Plan: Will initiate therapy with Colace 100 mg BID. Encouraged increased hydration and fiber intake.  Orders: -     Docusate Sodium ; Take 1 capsule (100 mg total) by mouth 2 (two) times daily.  Dispense: 30 capsule; Refill: 0  IFG (impaired fasting glucose) -     Hemoglobin A1c  Vitamin D  deficiency -     VITAMIN D  25 Hydroxy (Vit-D Deficiency, Fractures)  TSH (thyroid -stimulating hormone deficiency) -     TSH + free T4  Other hyperlipidemia -     Lipid panel -     CMP14+EGFR -     CBC with Differential/Platelet    Follow Up Instructions: No follow-ups on file.   I discussed the assessment and treatment plan with the patient. The patient was provided an opportunity to ask questions, and all were answered. The patient agreed with the plan and demonstrated an understanding of the instructions.   The patient was advised to call back or seek an in-person evaluation if the symptoms worsen or if the condition fails to improve as anticipated.  The above assessment and management plan was discussed with the patient. The patient verbalized understanding of and has agreed to the management plan.   [name], FNP

## 2024-06-30 ENCOUNTER — Other Ambulatory Visit (HOSPITAL_COMMUNITY): Payer: Self-pay

## 2024-06-30 ENCOUNTER — Other Ambulatory Visit: Payer: Self-pay

## 2024-06-30 DIAGNOSIS — K5900 Constipation, unspecified: Secondary | ICD-10-CM | POA: Insufficient documentation

## 2024-06-30 DIAGNOSIS — L659 Nonscarring hair loss, unspecified: Secondary | ICD-10-CM | POA: Insufficient documentation

## 2024-06-30 MED ORDER — MINOXIDIL 5 % EX FOAM
CUTANEOUS | 0 refills | Status: DC
  Filled 2024-06-30: qty 60, 30d supply, fill #0

## 2024-06-30 NOTE — Telephone Encounter (Signed)
 Message sent to patient

## 2024-06-30 NOTE — Assessment & Plan Note (Signed)
 Will initiate therapy on  topical Minoxidil  foam to be applied once daily to help promote hair growth. She is encouraged to apply  capful once daily directly to the scalp, ensuring the scalp is clean and dry before application, and using her fingertips to gently spread the product. Wash hands after use and allow at least 2-4 hours before shampooing or going to bed to allow for absorption. Common side effects include irritation, dryness, itching, or flaking. Do not apply to broken or infected scalp skin. Continue treatment for at least 4 months

## 2024-06-30 NOTE — Addendum Note (Signed)
 Addended by: Jylan Loeza on: 06/30/2024 06:25 AM   Modules accepted: Orders

## 2024-06-30 NOTE — Assessment & Plan Note (Signed)
 Will initiate therapy with Colace 100 mg BID. Encouraged increased hydration and fiber intake.

## 2024-07-03 ENCOUNTER — Other Ambulatory Visit: Payer: Self-pay

## 2024-07-03 ENCOUNTER — Ambulatory Visit: Payer: HMO

## 2024-07-03 VITALS — Ht 64.0 in | Wt 133.0 lb

## 2024-07-03 DIAGNOSIS — Z0000 Encounter for general adult medical examination without abnormal findings: Secondary | ICD-10-CM | POA: Diagnosis not present

## 2024-07-03 DIAGNOSIS — Z79899 Other long term (current) drug therapy: Secondary | ICD-10-CM

## 2024-07-03 DIAGNOSIS — Z2821 Immunization not carried out because of patient refusal: Secondary | ICD-10-CM

## 2024-07-03 DIAGNOSIS — Z91199 Patient's noncompliance with other medical treatment and regimen due to unspecified reason: Secondary | ICD-10-CM

## 2024-07-03 DIAGNOSIS — F319 Bipolar disorder, unspecified: Secondary | ICD-10-CM

## 2024-07-03 NOTE — Patient Instructions (Signed)
 Stacy Chambers,  Thank you for taking the time for your Medicare Wellness Visit. I appreciate your continued commitment to your health goals. Please review the care plan we discussed, and feel free to reach out if I can assist you further.  Medicare recommends these wellness visits once per year to help you and your care team stay ahead of potential health issues. These visits are designed to focus on prevention, allowing your provider to concentrate on managing your acute and chronic conditions during your regular appointments.  Please note that Annual Wellness Visits do not include a physical exam. Some assessments may be limited, especially if the visit was conducted virtually. If needed, we may recommend a separate in-person follow-up with your provider.  Ongoing Care Seeing your primary care provider every 3 to 6 months helps us  monitor your health and provide consistent, personalized care.   Referrals If a referral was made during today's visit and you haven't received any updates within two weeks, please contact the referred provider directly to check on the status. A referral has been placed for you to check and see what additional resources are available to you.   If you haven't heard from anyone within the next 7 business days, please call them and let them know a referral has been placed for you Phone: [phone]  Recommended Screenings:  Health Maintenance  Topic Date Due   COVID-19 Vaccine (1) Never done   Hepatitis B Vaccine (1 of 3 - 19+ 3-dose series) Never done   Zoster (Shingles) Vaccine (1 of 2) Never done   Pneumococcal Vaccine for age over 19 (1 of 1 - PCV) Never done   DTaP/Tdap/Td vaccine (2 - Td or Tdap) 11/23/2023   Flu Shot  05/26/2024   Screening for Lung Cancer  03/08/2025   Mammogram  05/04/2025   Medicare Annual Wellness Visit  07/03/2025   Colon Cancer Screening  09/19/2028   Hepatitis C Screening  Completed   HIV Screening  Completed   HPV Vaccine   Aged Out   Meningitis B Vaccine  Aged Out       07/03/2024    8:45 AM  Advanced Directives  Does Patient Have a Medical Advance Directive? No  Would patient like information on creating a medical advance directive? No - Patient declined   Advance Care Planning is important because it: Ensures you receive medical care that aligns with your values, goals, and preferences. Provides guidance to your family and loved ones, reducing the emotional burden of decision-making during critical moments.  Vision: Annual vision screenings are recommended for early detection of glaucoma, cataracts, and diabetic retinopathy. These exams can also reveal signs of chronic conditions such as diabetes and high blood pressure.  Dental: Annual dental screenings help detect early signs of oral cancer, gum disease, and other conditions linked to overall health, including heart disease and diabetes.  Please see the attached documents for additional preventive care recommendations.

## 2024-07-03 NOTE — Progress Notes (Signed)
 Subjective:   Stacy Chambers is a 58 y.o. who presents for a Medicare Wellness preventive visit.  As a reminder, Annual Wellness Visits don't include a physical exam, and some assessments may be limited, especially if this visit is performed virtually. We may recommend an in-person follow-up visit with your provider if needed.  Visit Complete: Virtual I connected with  [name] on 07/03/24 by a video and audio enabled telemedicine application and verified that I am speaking with the correct person using two identifiers.  Patient Location: Home  Provider Location: Home Office  I discussed the limitations of evaluation and management by telemedicine. The patient expressed understanding and agreed to proceed.  Vital Signs: Because this visit was a virtual/telehealth visit, some criteria may be missing or patient reported. Any vitals not documented were not able to be obtained and vitals that have been documented are patient reported.  Persons Participating in Visit: Patient.  AWV Questionnaire: Yes: Patient Medicare AWV questionnaire was completed by the patient on 07/03/2024; I have confirmed that all information answered by patient is correct and no changes since this date.  Cardiac Risk Factors include: advanced age (>79men, >59 women);dyslipidemia;hypertension;sedentary lifestyle     Objective:    Today's Vitals   07/03/24 0827 07/03/24 0846  Weight:  133 lb (60.3 kg)  Height:  5' 4 (1.626 m)  PainSc: 6  6   PainLoc:  Back   Body mass index is 22.83 kg/m.     07/03/2024    8:45 AM 12/22/2023    4:51 PM 06/29/2023    7:57 AM 04/20/2022   10:02 AM 04/10/2020   12:32 PM 12/19/2019    8:13 AM 11/21/2018    8:10 AM  Advanced Directives  Does Patient Have a Medical Advance Directive? No No No No No No   Would patient like information on creating a medical advance directive? No - Patient declined No - Patient declined No - Patient declined Yes (ED - Information included in  AVS) No - Patient declined Yes (MAU/Ambulatory/Procedural Areas - Information given)      Information is confidential and restricted. Go to Review Flowsheets to unlock data.    Current Medications (verified) Outpatient Encounter Medications as of 07/03/2024  Medication Sig   divalproex  (DEPAKOTE  ER) 250 MG 24 hr tablet Take 1 tablet (250 mg total) by mouth daily. Take along with 1000 mg daily , total of 1250 mg.   divalproex  (DEPAKOTE  ER) 500 MG 24 hr tablet Take 2 tablets (1,000 mg total) by mouth daily. (Take along with 250 mg daily - total of 1250 mg)   docusate sodium  (COLACE) 100 MG capsule Take 1 capsule (100 mg total) by mouth 2 (two) times daily.   escitalopram  (LEXAPRO ) 20 MG tablet Take 20 mg by mouth daily.   esomeprazole  (NEXIUM ) 20 MG capsule Take 1 capsule (20 mg total) by mouth daily as needed. Take on empty stomach.   fluticasone  (FLONASE ) 50 MCG/ACT nasal spray Place 2 sprays into both nostrils daily.   meloxicam  (MOBIC ) 15 MG tablet Take 1 tablet (15 mg total) by mouth daily.   methocarbamol  (ROBAXIN ) 500 MG tablet Take 1 tablet (500 mg total) by mouth daily.   Minoxidil  5 % FOAM Apply Apply 1/2 capful once daily to the scalp in the area of hair thinning   pantoprazole  (PROTONIX ) 20 MG tablet Take 1 tablet (20 mg total) by mouth daily.   rOPINIRole  (REQUIP ) 0.25 MG tablet Take 1 tablet (0.25 mg total) by  mouth at bedtime.   rosuvastatin  (CRESTOR ) 10 MG tablet Take 1 tablet (10 mg total) by mouth daily.   traZODone  (DESYREL ) 100 MG tablet Take 1-2 tablets (100-200 mg total) by mouth at bedtime as needed, for sleep.   No facility-administered encounter medications on file as of 07/03/2024.    Allergies (verified) Benadryl [diphenhydramine hcl], Codeine , and Vicodin [hydrocodone-acetaminophen ]   History: Past Medical History:  Diagnosis Date   Abnormal blood chemistry 10/03/2015   Anginal pain (HCC)    Anxiety    Anxiety, generalized 02/21/2015   Bipolar affective disorder  (HCC) 03/08/2013   Overview:  Last Assessment & Plan:  Continue meds, f/u with psych Restart benzo at 1 po BID prn,     Bipolar disorder (HCC)    Cancer (HCC) 1995   uterine   Chronic obstructive pulmonary disease (HCC) 10/03/2015   COPD (chronic obstructive pulmonary disease) (HCC)    no inhalers   Elevated liver function tests 11/26/2004   H/O NEGATIVE LIVER BIOPSY, workup negative   Gallstones 02/21/2015   GERD (gastroesophageal reflux disease)    Hyperlipidemia    Insomnia    Lower back pain    Osteoarthritis    Ovarian cancer (HCC) 1995   RLS (restless legs syndrome)    Sedative, hypnotic or anxiolytic dependence with withdrawal, 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HCC)    Wears dentures    full upper and lower   Past Surgical History:  Procedure Laterality Date   ABDOMINAL HYSTERECTOMY     APPENDECTOMY     BREAST BIOPSY Right 03/12/2015   fat necrosis   CESAREAN SECTION     COLONOSCOPY WITH PROPOFOL  N/A 05/30/2018   Procedure: COLONOSCOPY WITH PROPOFOL ;  Surgeon: Viktoria Lamar DASEN, MD;  Location[facility] END[facility];  Service: Endoscopy;  Laterality: N/A;   COLONOSCOPY WITH PROPOFOL  N/A 09/20/2023   Procedure: COLONOSCOPY WITH PROPOFOL ;  Surgeon: Cindie Carlin POUR, DO;  [facility];  Service: Endoscopy;  Laterality: N/A;  930am, asa 3   EXCISION MORTON'S NEUROMA Left 10/07/2016   Procedure: EXCISION MORTON'S NEUROMA;  Surgeon: Eva Gay, DPM;  Location: [facility];  Service: Podiatry;  Laterality: Left;  IV WITH LOCAL   LIVER BIOPSY  2005   PerPt: Done to eval elevated LFTs and biopsy provided no definite dx/cause of elevated LFTs   MULTIPLE TOOTH EXTRACTIONS     OOPHORECTOMY     OSTECTOMY Right 04/10/2020   Procedure: DOUBLE OSTEOTOMY GENERAL W/ LOCAL;  Surgeon: Gay Eva, DPM;  Location: [facility];  Service: Podiatry;  Laterality: Right;   Family History  Adopted: Yes  Problem Relation Age of Onset   COPD Mother    Depression Mother    Colon cancer Mother         unsure of age   Cancer Father        Lung CA, Skin Cancer--?type   COPD Father    Stroke Father    Cancer Sister 39       cervical cancer   COPD Brother    Alcohol abuse Brother    Stroke Brother    COPD Maternal Aunt    Depression Daughter    Schizophrenia Son    Breast cancer Neg Hx    Social History   Socioeconomic History   Marital status: Widowed    Spouse name: Not on file   Number of children: Not on file   Years of education: Not on file   Highest education level: 10th grade  Occupational History  Not on file  Tobacco Use   Smoking status: Former    Current packs/day: 0.00    Average packs/day: 1 pack/day for 30.0 years (30.0 ttl pk-yrs)    Types: Cigarettes    Start date: 04/09/1979    Quit date: 04/08/2009    Years since quitting: 15.2   Smokeless tobacco: Never  Vaping Use   Vaping status: Every Day  Substance and Sexual Activity   Alcohol use: Not Currently    Alcohol/week: 3.0 - 7.0 standard drinks of alcohol    Types: 3 - 7 Shots of liquor per week    Comment: occasionally   Drug use: Not Currently    Types: Marijuana    Comment: told to refrain until after surgery   Sexual activity: Yes    Birth control/protection: Surgical  Other Topics Concern   Not on file  Social History Narrative   Not on file   Social Drivers of Health   Financial Resource Strain: Low Risk  (07/03/2024)   Overall Financial Resource Strain (CARDIA)    Difficulty of Paying Living Expenses: Not hard at all  Food Insecurity: Food Insecurity Present (07/03/2024)   Hunger Vital Sign    Worried About Running Out of Food in the Last Year: Sometimes true    Ran Out of Food in the Last Year: Often true  Transportation Needs: No Transportation Needs (07/03/2024)   PRAPARE - Administrator, Civil Service (Medical): No    Lack of Transportation (Non-Medical): No  Physical Activity: Inactive (07/03/2024)   Exercise Vital Sign    Days of Exercise per Week: 0 days     Minutes of Exercise per Session: 0 min  Stress: No Stress Concern Present (07/03/2024)   [employer] of Occupational Health - Occupational Stress Questionnaire    Feeling of Stress: Not at all  Social Connections: Patient Declined (07/03/2024)   Social Connection and Isolation Panel    Frequency of Communication with Friends and Family: Patient declined    Frequency of Social Gatherings with Friends and Family: Patient declined    Attends Religious Services: Patient declined    Database administrator or Organizations: Patient declined    Attends Engineer, structural: Patient declined    Marital Status: Patient declined    Tobacco Counseling Counseling given: Yes    Clinical Intake:  Pre-visit preparation completed: Yes  Pain : 0-10 Pain Score: 6  Pain Type: Chronic pain Pain Location: Back Pain Orientation: Lower Pain Descriptors / Indicators: Constant Pain Onset: More than a month ago Pain Frequency: Constant     BMI - recorded: 22.83 Nutritional Status: BMI of 19-24  Normal Diabetes: No  Lab Results  Component Value Date   HGBA1C 5.2 09/10/2023   HGBA1C 5.4 10/30/2022   HGBA1C 5.5 03/30/2022     How often do you need to have someone help you when you read instructions, pamphlets, or other written materials from your doctor or pharmacy?: 1 - Never  Interpreter Needed?: No  Information entered by :: Jniyah Dantuono W CMA (AAMA)   Activities of Daily Living     07/03/2024    8:27 AM 09/15/2023    9:50 AM  In your present state of health, do you have any difficulty performing the following activities:  Hearing? 0   Vision? 0   Difficulty concentrating or making decisions? 1   Walking or climbing stairs? 1   Dressing or bathing? 0   Doing errands, shopping? 0 0  Preparing  Food and eating ? Y   Using the Toilet? N   In the past six months, have you accidently leaked urine? Y   Do you have problems with loss of bowel control? N   Managing your Medications?  N   Managing your Finances? Y   Housekeeping or managing your Housekeeping? Y     Patient Care Team: Zarwolo, Gloria, FNP as PCP - General (Family Medicine) Nicholaus Sherlean CROME, [facility] (Inactive) as Pharmacist (Pharmacist)  I have updated your Care Teams any recent Medical Services you may have received from other providers in the past year.     Assessment:   This is a routine wellness examination for Ashby.  Hearing/Vision screen Hearing Screening - Comments:: Patient denies any hearing difficulties.   Vision Screening - Comments:: Wears rx glasses - up to date with routine eye exams with  [facility]   Goals Addressed               This Visit's Progress     Patient Stated (pt-stated)        I'd like to get up and do things like I did before.        Depression Screen     07/03/2024    8:55 AM 06/28/2024   12:09 PM 03/10/2024    8:15 AM 01/18/2024   11:54 AM 06/29/2023    8:04 AM 06/10/2023    8:44 AM 06/10/2023    8:28 AM  PHQ 2/9 Scores  PHQ - 2 Score 0  0  1 3 0  PHQ- 9 Score 4  0  3 11 0     Information is confidential and restricted. Go to Review Flowsheets to unlock data.    Fall Risk     07/03/2024    8:27 AM 03/10/2024    8:15 AM 07/07/2023   11:13 AM 06/29/2023    8:13 AM 06/10/2023    8:28 AM  Fall Risk   Falls in the past year? 0 1 0 0 0  Number falls in past yr: 0 0  0 0  Injury with Fall? 1 1  0 0  Risk for fall due to : History of fall(s);Impaired balance/gait;Impaired mobility History of fall(s)  No Fall Risks No Fall Risks  Follow up Falls prevention discussed;Education provided;Falls evaluation completed Falls evaluation completed  Falls prevention discussed Falls evaluation completed    MEDICARE RISK AT HOME:  Medicare Risk at Home Any stairs in or around the home?: (Patient-Rptd) Yes If so, are there any without handrails?: (Patient-Rptd) No Home free of loose throw rugs in walkways, pet beds, electrical cords, etc?: (Patient-Rptd)  Yes Adequate lighting in your home to reduce risk of falls?: (Patient-Rptd) Yes Life alert?: (Patient-Rptd) No Use of a cane, walker or w/c?: (Patient-Rptd) No Grab bars in the bathroom?: (Patient-Rptd) No Shower chair or bench in shower?: (Patient-Rptd) No Elevated toilet seat or a handicapped toilet?: (Patient-Rptd) No  TIMED UP AND GO:  Was the test performed?  No  Cognitive Function: 6CIT completed        07/03/2024    8:54 AM 06/29/2023    8:02 AM 04/20/2022    9:50 AM 01/31/2019    9:37 AM  6CIT Screen  What Year? 0 points 0 points 0 points 0 points  What month? 0 points 0 points 0 points 0 points  What time? 0 points 0 points 0 points 0 points  Count back from 20 0 points 0 points 0 points  2 points  Months in reverse 0 points 0 points 2 points 2 points  Repeat phrase 0 points 2 points 2 points 0 points  Total Score 0 points 2 points 4 points 4 points    Immunizations Immunization History  Administered Date(s) Administered   Influenza, Seasonal, Injecte, Preservative Fre 09/10/2023   Influenza,inj,Quad PF,6+ Mos 06/26/2014, 06/14/2019, 09/16/2020, 06/26/2022   Influenza-Unspecified 09/14/2018   Tdap 11/22/2013    Screening Tests Health Maintenance  Topic Date Due   COVID-19 Vaccine (1) Never done   Hepatitis B Vaccines 19-59 Average Risk (1 of 3 - 19+ 3-dose series) Never done   Zoster Vaccines- Shingrix (1 of 2) Never done   Pneumococcal Vaccine: 50+ Years (1 of 1 - PCV) Never done   DTaP/Tdap/Td (2 - Td or Tdap) 11/23/2023   Influenza Vaccine  05/26/2024   Medicare Annual Wellness (AWV)  06/28/2024   Lung Cancer Screening  03/08/2025   MAMMOGRAM  05/04/2025   Colonoscopy  09/19/2028   Hepatitis C Screening  Completed   HIV Screening  Completed   HPV VACCINES  Aged Out   Meningococcal B Vaccine  Aged Out    Health Maintenance  Health Maintenance Due  Topic Date Due   COVID-19 Vaccine (1) Never done   Hepatitis B Vaccines 19-59 Average Risk (1 of 3 - 19+  3-dose series) Never done   Zoster Vaccines- Shingrix (1 of 2) Never done   Pneumococcal Vaccine: 50+ Years (1 of 1 - PCV) Never done   DTaP/Tdap/Td (2 - Td or Tdap) 11/23/2023   Influenza Vaccine  05/26/2024   Medicare Annual Wellness (AWV)  06/28/2024   Health Maintenance Items Addressed: Routine health screenings are up to date. Patient is aware of recommended vaccines.  Patient declines all vaccinations  Additional Screening:  Vision Screening: Recommended annual ophthalmology exams for early detection of glaucoma and other disorders of the eye. Would you like a referral to an eye doctor? No    Dental Screening: Recommended annual dental exams for proper oral hygiene  Community Resource Referral / Chronic Care Management: CRR required this visit?  Yes   CCM required this visit?  No   Plan:    I have personally reviewed and noted the following in the patient's chart:   Medical and social history Use of alcohol, tobacco or illicit drugs  Current medications and supplements including opioid prescriptions. Patient is not currently taking opioid prescriptions. Functional ability and status Nutritional status Physical activity Advanced directives List of other physicians Hospitalizations, surgeries, and ER visits in previous 12 months Vitals Screenings to include cognitive, depression, and falls Referrals and appointments  In addition, I have reviewed and discussed with patient certain preventive protocols, quality metrics, and best practice recommendations. A written personalized care plan for preventive services as well as general preventive health recommendations were provided to patient.   Cortni Tays, CMA   07/03/2024   After Visit Summary: (MyChart) Due to this being a telephonic visit, the after visit summary with patients personalized plan was offered to patient via MyChart   Notes: Nothing significant to report at this time.

## 2024-07-04 ENCOUNTER — Telehealth: Payer: Self-pay

## 2024-07-04 NOTE — Progress Notes (Signed)
 Complex Care Management Note  Care Guide Note 07/04/2024 Name: EDWINA GROSSBERG MRN: [id number] DOB: Jul 12, 1966  KYM SCANNELL is a 58 y.o. year old female who sees Zarwolo, Gloria, FNP for primary care. I reached out to [name] by phone today to offer complex care management services.  Ms. Bloomfield was given information about Complex Care Management services today including:   The Complex Care Management services include support from the care team which includes your Nurse Care Manager, Clinical Social Worker, or Pharmacist.  The Complex Care Management team is here to help remove barriers to the health concerns and goals most important to you. Complex Care Management services are voluntary, and the patient may decline or stop services at any time by request to their care team member.   Complex Care Management Consent Status: Patient agreed to services and verbal consent obtained.   Follow up plan:  Telephone appointment with complex care management team member scheduled for:  BSW 07/14/2024 RNCM 08/02/2024  Encounter Outcome:  Patient Scheduled  Jeoffrey Buffalo , RMA     [place]  [facility], [facility] Guide  Direct Dial: [phone]  Website: [email or web address]

## 2024-07-14 ENCOUNTER — Other Ambulatory Visit: Payer: Self-pay

## 2024-07-14 NOTE — Patient Outreach (Signed)
 Complex Care Management   Visit Note  07/14/2024  Name:  Stacy Chambers MRN: [id number] DOB: 1966-08-14  Situation: Referral received for Complex Care Management related to SDOH Barriers:  Financial Resource Strain Personal Care I obtained verbal consent from Patient.  Visit completed with Patient  on the phone  Background:   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   Chronic obstructive pulmonary disease (HCC) 10/03/2015   COPD (chronic obstructive pulmonary disease) (HCC)    no inhalers   Elevated liver function tests 11/26/2004   H/O NEGATIVE LIVER BIOPSY, workup negative   Gallstones 02/21/2015   GERD (gastroesophageal reflux disease)    Hyperlipidemia    Insomnia    Lower back pain    Osteoarthritis    Ovarian cancer (HCC) 1995   RLS (restless legs syndrome)    Sedative, hypnotic or anxiolytic dependence with withdrawal, 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HCC)    Wears dentures    full upper and lower    Assessment:  Patient reports back issues makes it difficult to clean and grocery shop. Patient also struggles with impulsive buying (during manic episodes) that makes paying bills difficult. Patients boyfriend assist with transportation. Daughter not willing to assist with bill management. SW suggests budgeting tools like maintaining a tablet with monthly bills written and Representative Payee options to manage her finances. SW t/c [employer] and patient has access to 30 hrs with [employer] and $145 OTC quarterly. Patient will need to call to request [phone]. SW to email list of Rep Payee options, RCATS, [employer] contact number.   SDOH Interventions    Flowsheet Row Patient  Outreach Telephone from 07/14/2024 in [place] POPULATION HEALTH DEPARTMENT Clinical Support from 07/03/2024 in [facility] [place] [facility] Care Coordination from 07/07/2023 in [facility] Community Care Coordination Clinical Support from 06/29/2023 in [facility] Primary Care Office Visit from 07/29/2022 in [facility] Primary Care Office Visit from 10/21/2018 in [facility]  SDOH Interventions        Food Insecurity Interventions --  [Uses monthly food bank] Other (Comment)  [pt states she currently goes to food pantrys and is good on food] Other (Comment)  [Receives Foodstamps and food banks] AMB Referral -- --  Housing Interventions Intervention Not Indicated Intervention Not Indicated Intervention Not Indicated Intervention Not Indicated -- --  Transportation Interventions Intervention Not Indicated  [Boyfriend provides transportation] Intervention Not Indicated Intervention Not Indicated  [Has transportatioin] Intervention Not Indicated -- --  Utilities Interventions Intervention Not Indicated Intervention Not Indicated -- Intervention Not Indicated -- --  Alcohol Usage Interventions -- Intervention Not Indicated (Score <7) -- Intervention Not Indicated (Score <7) -- --  Depression Interventions/Treatment  -- Currently on Treatment -- -- Currently on Treatment Currently on Treatment  Financial Strain Interventions Intervention Not Indicated Intervention Not Indicated -- Patient Declined -- --  Physical Activity Interventions -- Patient Declined -- Patient Declined -- --  Stress Interventions -- Intervention Not Indicated -- Intervention Not Indicated -- --  Social Connections Interventions -- Patient Declined -- Patient Declined -- --  Health Literacy Interventions -- Intervention Not Indicated --  Intervention Not Indicated -- --      Recommendation:   None  Follow Up Plan:   Telephone follow up appointment date/time:  07/21/24 at  10am  Tillman Gardener, BSW [place]  [facility] Social Worker Direct Dial: [phone]  Fax: [phone] Website: [email or web address]

## 2024-07-14 NOTE — Patient Instructions (Signed)
 Visit Information  Thank you for taking time to visit with me today. Please don't hesitate to contact me if I can be of assistance to you before our next scheduled appointment.  Our next appointment is by telephone on 07/21/24 at 10am Please call the care guide team at [phone] if you need to cancel or reschedule your appointment.   Following is a copy of your care plan:   Goals Addressed             This Visit's Progress    BSW VBCI Social Work Care Plan       Problems:   Corporate treasurer  and Personal Care  CSW Clinical Goal(s):   Over the next 7 days the Patient will will follow up with [employer] and [employer] as directed by Social Work.  Interventions:  Social Determinants of Health in Patient with Bipolar Disorder and Memory loss: SDOH assessments completed: Financial Strain  and Personal Care Evaluation of current treatment plan related to unmet needs Patient reports back issues makes it difficult to clean and grocery shop.  Patient also struggles with impulsive buying (during manic episodes) that makes paying bills difficult. Patients boyfriend assist with transportation.  Daughter not willing to assist with bill management.  SW suggests budgeting tools like maintaining a tablet with monthly bills written and Representative Payee options to manage her finances.  SW t/c [employer] and patient has access to 30 hrs with [employer] and $145 OTC quarterly.  Patient will need to call to request [phone].  SW to email list of Rep Payee options, RCATS, [employer] contact number.   Patient Goals/Self-Care Activities:  Patient will contact [employer] and review option for Representative Payee services to manage finances.  Plan:   Telephone follow up appointment with care management team member scheduled for:  07/21/24 at 10am.        Please call 911 if you are experiencing a Mental Health or Behavioral Health Crisis or need someone to  talk to.  Patient verbalizes understanding of instructions and care plan provided today and agrees to view in MyChart. Active MyChart status and patient understanding of how to access instructions and care plan via MyChart confirmed with patient.     Tillman Gardener, BSW [place]  [facility], [facility] Social Worker Direct Dial: [phone]  Fax: [phone] Website: [email or web address]

## 2024-07-21 ENCOUNTER — Other Ambulatory Visit: Payer: Self-pay

## 2024-07-21 NOTE — Patient Instructions (Signed)

## 2024-07-21 NOTE — Patient Outreach (Signed)
 Complex Care Management   Visit Note  07/21/2024  Name:  Stacy Chambers MRN: [id number] DOB: [birth year]  Situation: Referral received for Complex Care Management related to SDOH Barriers:  Financial Resource Strain I obtained verbal consent from Patient.  Visit completed with Patient  on the phone  Background:   Past Medical History:  Diagnosis Date   Abnormal blood chemistry 10/03/2015   Anginal pain    Anxiety    Anxiety, generalized 02/21/2015   Bipolar affective disorder (HCC) 03/08/2013   Overview:  Last Assessment & Plan:  Continue meds, f/u with psych Restart benzo at 1 po BID prn,     Bipolar disorder (HCC)    Cancer (HCC) 1995   uterine   Chronic obstructive pulmonary disease (HCC) 10/03/2015   COPD (chronic obstructive pulmonary disease) (HCC)    no inhalers   Elevated liver function tests 11/26/2004   H/O NEGATIVE LIVER BIOPSY, workup negative   Gallstones 02/21/2015   GERD (gastroesophageal reflux disease)    Hyperlipidemia    Insomnia    Lower back pain    Osteoarthritis    Ovarian cancer (HCC) 1995   RLS (restless legs syndrome)    Sedative, hypnotic or anxiolytic dependence with withdrawal, 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HCC)    Wears dentures    full upper and lower    Assessment:  Patient has communicated with Stacy Chambers and services will being 9/30. Patient has also communicated with her roommate Stacy Chambers to assist with managing her income. Stacy Chambers is employed and supports himself. Patient also received food assistance and a $30 clothing voucher from [employer]. Patient has no additional needs and no follow up is requested. Case closed.    SDOH Interventions    Flowsheet Row Patient Outreach Telephone from 07/14/2024 in [place] [facility] Clinical Support from 07/03/2024 in [facility] Care Coordination from 07/07/2023 in [facility] Community Care Coordination Clinical Support from 06/29/2023 in [facility] Primary  Care Office Visit from 07/29/2022 in [facility] Primary Care Office Visit from 10/21/2018 in [facility]  SDOH Interventions        Food Insecurity Interventions --  [Uses monthly food bank] Other (Comment)  [pt states she currently goes to food pantrys and is good on food] Other (Comment)  [Receives Foodstamps and food banks] AMB Referral -- --  Housing Interventions Intervention Not Indicated Intervention Not Indicated Intervention Not Indicated Intervention Not Indicated -- --  Transportation Interventions Intervention Not Indicated  [Boyfriend provides transportation] Intervention Not Indicated Intervention Not Indicated  [Has transportatioin] Intervention Not Indicated -- --  Utilities Interventions Intervention Not Indicated Intervention Not Indicated -- Intervention Not Indicated -- --  Alcohol Usage Interventions -- Intervention Not Indicated (Score <7) -- Intervention Not Indicated (Score <7) -- --  Depression Interventions/Treatment  -- Currently on Treatment -- -- Currently on Treatment Currently on Treatment  Financial Strain Interventions Intervention Not Indicated Intervention Not Indicated -- Patient Declined -- --  Physical Activity Interventions -- Patient Declined -- Patient Declined -- --  Stress Interventions -- Intervention Not Indicated -- Intervention Not Indicated -- --  Social Connections Interventions -- Patient Declined -- Patient Declined -- --  Health Literacy Interventions -- Intervention Not Indicated -- Intervention Not Indicated -- --      Recommendation:   None  Follow Up Plan:   Patient has met all care management goals. Care Management case will be closed. Patient has been provided contact information should new needs arise.  Tillman Gardener, BSW [place]  [facility], [facility] Social Worker Direct Dial: [phone]  Fax: [phone] Website: [email or web address]

## 2024-07-27 ENCOUNTER — Other Ambulatory Visit: Payer: Self-pay

## 2024-07-27 ENCOUNTER — Other Ambulatory Visit (HOSPITAL_COMMUNITY): Payer: Self-pay

## 2024-08-02 ENCOUNTER — Other Ambulatory Visit: Payer: Self-pay | Admitting: *Deleted

## 2024-08-02 DIAGNOSIS — Z789 Other specified health status: Secondary | ICD-10-CM

## 2024-08-02 NOTE — Patient Outreach (Signed)
 Complex Care Management   Visit Note  08/02/2024  Name:  Stacy Chambers MRN: [id number] DOB: 10-Aug-1966  Situation: Referral received for Complex Care Management related to Mental/Behavioral Health diagnosis Bipolar affective disorder and High Risk Medication Use / noncompliance with treatment plan I obtained verbal consent from Patient.  Visit completed with Patient  on the phone  Background:   Past Medical History:  Diagnosis Date   Abnormal blood chemistry 10/03/2015   Anginal pain    Anxiety    Anxiety, generalized 02/21/2015   Bipolar affective disorder (HCC) 03/08/2013   Overview:  Last Assessment & Plan:  Continue meds, f/u with psych Restart benzo at 1 po BID prn,     Bipolar disorder (HCC)    Cancer (HCC) 1995   uterine   Chronic obstructive pulmonary disease (HCC) 10/03/2015   COPD (chronic obstructive pulmonary disease) (HCC)    no inhalers   Elevated liver function tests 11/26/2004   H/O NEGATIVE LIVER BIOPSY, workup negative   Gallstones 02/21/2015   GERD (gastroesophageal reflux disease)    Hyperlipidemia    Insomnia    Lower back pain    Osteoarthritis    Ovarian cancer (HCC) 1995   RLS (restless legs syndrome)    Sedative, hypnotic or anxiolytic dependence with withdrawal, 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HCC)    Wears dentures    full upper and lower    Assessment: Patient Reported Symptoms:  Cognitive Cognitive Status: Able to follow simple commands, Difficulties with attention and concentration   Health Maintenance Behaviors: Annual physical exam Healing Pattern: Average Health Facilitated by: Rest  Neurological Neurological Review of Symptoms: No symptoms reported Neurological Management Strategies: Routine screening Neurological Self-Management Outcome: 4 (good)  HEENT HEENT Symptoms Reported: No symptoms reported HEENT Management Strategies: Routine screening HEENT Self-Management Outcome: 4 (good)    Cardiovascular Cardiovascular Symptoms Reported: No symptoms  reported Does patient have uncontrolled Hypertension?: No Cardiovascular Management Strategies: Routine screening Cardiovascular Self-Management Outcome: 4 (good)  Respiratory Respiratory Symptoms Reported: No symptoms reported Respiratory Management Strategies: Routine screening Respiratory Self-Management Outcome: 4 (good)  Endocrine Endocrine Symptoms Reported: No symptoms reported Is patient diabetic?: No Endocrine Self-Management Outcome: 4 (good)  Gastrointestinal Gastrointestinal Symptoms Reported: Constipation Gastrointestinal Management Strategies: Medication therapy Gastrointestinal Self-Management Outcome: 4 (good)    Genitourinary Genitourinary Symptoms Reported: No symptoms reported Genitourinary Management Strategies: Medication therapy Genitourinary Self-Management Outcome: 4 (good)  Integumentary Integumentary Symptoms Reported: No symptoms reported Skin Management Strategies: Routine screening Skin Self-Management Outcome: 4 (good)  Musculoskeletal Musculoskelatal Symptoms Reviewed: Back pain, Limited mobility, Unsteady gait Musculoskeletal Management Strategies: Medical device, Routine screening, Medication therapy Musculoskeletal Self-Management Outcome: 3 (uncertain) Falls in the past year?: Yes Number of falls in past year: 1 or less Was there an injury with Fall?: Yes Fall Risk Category Calculator: 2 Patient Fall Risk Level: Moderate Fall Risk Patient at Risk for Falls Due to: History of fall(s), Impaired balance/gait, Impaired mobility Fall risk Follow up: Falls evaluation completed, Education provided, Falls prevention discussed  Psychosocial Psychosocial Symptoms Reported: No symptoms reported Behavioral Management Strategies: Support system Behavioral Health Self-Management Outcome: 4 (good) Major Change/Loss/Stressor/Fears (CP): Denies Techniques to Cope with Loss/Stress/Change: Not applicable Quality of Family Relationships: non-existent Do you feel  physically threatened by others?: No    08/02/2024    PHQ2-9 Depression Screening   Little interest or pleasure in doing things Nearly every day  Feeling down, depressed, or hopeless Nearly every day  PHQ-2 - Total Score 6  Trouble falling or staying asleep, or sleeping too much  More than half the days  Feeling tired or having little energy Nearly every day  Poor appetite or overeating  More than half the days  Feeling bad about yourself - or that you are a failure or have let yourself or your family down Not at all  Trouble concentrating on things, such as reading the newspaper or watching television Not at all  Moving or speaking so slowly that other people could have noticed.  Or the opposite - being so fidgety or restless that you have been moving around a lot more than usual Not at all  Thoughts that you would be better off dead, or hurting yourself in some way Not at all  PHQ2-9 Total Score 13  If you checked off any problems, how difficult have these problems made it for you to do your work, take care of things at home, or get along with other people Not difficult at all  Depression Interventions/Treatment Medication, Currently on Treatment    There were no vitals filed for this visit.  Medications Reviewed Today     Reviewed by Bertrum Rosina HERO, RN (Registered Nurse) on 08/02/24 at 1012  Med List Status: <None>   Medication Order Taking? Sig Documenting Provider Last Dose Status Informant  divalproex  (DEPAKOTE  ER) 250 MG 24 hr tablet [id number]  Take 1 tablet (250 mg total) by mouth daily. Take along with 1000 mg daily , total of 1250 mg.  Patient not taking: Reported on 08/02/2024   Eappen, Saramma, MD  Active   divalproex  (DEPAKOTE  ER) 500 MG 24 hr tablet [id number] Yes Take 2 tablets (1,000 mg total) by mouth daily. (Take along with 250 mg daily - total of 1250 mg) Eappen, Saramma, MD  Active   docusate sodium  (COLACE) 100 MG capsule [id number] Yes Take 1 capsule (100 mg total)  by mouth 2 (two) times daily. Edman Meade PEDLAR, FNP  Active   escitalopram  (LEXAPRO ) 20 MG tablet [id number] Yes Take 20 mg by mouth daily. [provider]  Active   esomeprazole  (NEXIUM ) 20 MG capsule [id number] Yes Take 1 capsule (20 mg total) by mouth daily as needed. Take on empty stomach. Edman Meade PEDLAR, FNP  Active   fluticasone  (FLONASE ) 50 MCG/ACT nasal spray [id number] Yes Place 2 sprays into both nostrils daily. Edman Meade PEDLAR, FNP  Active   meloxicam  (MOBIC ) 15 MG tablet [id number] Yes Take 1 tablet (15 mg total) by mouth daily. Bevely Doffing, FNP  Active   methocarbamol  (ROBAXIN ) 500 MG tablet [id number] Yes Take 1 tablet (500 mg total) by mouth daily. Bevely Doffing, FNP  Active   Minoxidil  5 % JACQUELYNN [id number] Yes Apply Apply 1/2 capful once daily to the scalp in the area of hair thinning Bacchus, Meade PEDLAR, FNP  Active   pantoprazole  (PROTONIX ) 20 MG tablet [id number] Yes Take 1 tablet (20 mg total) by mouth daily. Edman Meade PEDLAR, FNP  Active   rOPINIRole  (REQUIP ) 0.25 MG tablet [id number] Yes Take 1 tablet (0.25 mg total) by mouth at bedtime. Eappen, Saramma, MD  Active   rosuvastatin  (CRESTOR ) 10 MG tablet [id number] Yes Take 1 tablet (10 mg total) by mouth daily. Edman Meade PEDLAR, FNP  Active   traZODone  (DESYREL ) 100 MG tablet [id number] Yes Take 1-2 tablets (100-200 mg total) by mouth at bedtime as needed, for sleep. Eappen, Saramma, MD  Active             Recommendation:   Continue Current Plan of Care  Follow Up Plan:  Closing From:  Complex Care Management. Does not meet criteria for RNCM at this time  Rosina Forte, BSN RN [facility], [facility] RN Care Manager Direct Dial: [phone]  Fax: [phone]

## 2024-08-02 NOTE — Patient Instructions (Signed)
 Visit Information  Thank you for taking time to visit with me today. Please don't hesitate to contact me if I can be of assistance to you before our next scheduled appointment.  Our next appointment is no further scheduled appointments.   Please call the care guide team at [phone] if you need to cancel or reschedule your appointment.     Please call the Suicide and Crisis Lifeline: 988 call the [place]  National Suicide Prevention Lifeline: 770-884-4942 or TTY: 774-276-2101 TTY 385-650-1766) to talk to a trained counselor call 1-800-273-TALK (toll free, 24 hour hotline) if you are experiencing a Mental Health or Behavioral Health Crisis or need someone to talk to.  Patient verbalizes understanding of instructions and care plan provided today and agrees to view in MyChart. Active MyChart status and patient understanding of how to access instructions and care plan via MyChart confirmed with patient.     Rosina Forte, BSN RN [facility], [facility] RN Care Manager Direct Dial: [phone]  Fax: [phone]

## 2024-08-07 ENCOUNTER — Telehealth: Payer: Self-pay

## 2024-08-07 NOTE — Progress Notes (Signed)
   Telephone encounter was:  Successful.  Complex Care Management Note Care Guide Note  08/07/2024 Name: Stacy Chambers MRN: [id number] DOB: [birth year]  [name] is a 58 y.o. year old female who is a primary care patient of Bacchus, Meade PEDLAR, FNP . The community resource team was consulted for assistance with Food Insecurity and Home Modifications  SDOH screenings and interventions completed:  Yes  Social Drivers of Health From This Encounter   Food Insecurity: Food Insecurity Present (08/07/2024)   Hunger Vital Sign    Worried About Running Out of Food in the Last Year: Often true    Ran Out of Food in the Last Year: Often true    SDOH Interventions Today    Flowsheet Row Most Recent Value  SDOH Interventions   Food Insecurity Interventions Community Resources Provided     Care guide performed the following interventions: Patient provided with information about care guide support team and interviewed to confirm resource needs.Pt is needing a grab bar for the shower. I was able to give infromation to [employer] for a grab bar and shower chair. I will be mailing other resources as requested by the pt   Follow Up Plan:  No further follow up planned at this time. The patient has been provided with needed resources.  Encounter Outcome:  Patient Visit Completed    [facility]  [employer] Guide, Phone: [phone] Fax: [phone] Website: [email or web address]

## 2024-08-11 ENCOUNTER — Ambulatory Visit: Payer: Self-pay

## 2024-08-11 NOTE — Telephone Encounter (Signed)
 Patient states that she has been having constant forgetfulness for the last two weeks. Patient states she is forgetting normal things from her life. Patient denies SI or HI. No alcohol or drug use. Patient is recommended to the ED to be evaluated. Patient refused. Patient educated about getting evaluated to check her blood work, urine, heart-to see if anything could be potentially causing her symptoms. Patient verbalized understanding but refused I don't want to spend money. Would like a call back   Copied from CRM #[id number]. Topic: Clinical - Red Word Triage >> Aug 11, 2024  4:31 PM Roselie BROCKS wrote: Red Word that prompted transfer to Nurse Triage: Patient states she feels she is in a mental health crisis, she is forgetting who people are, she dont know the people in her family , or anyone and is in altered mental status, Reason for Disposition  Patient sounds very sick or weak to the triager  Answer Assessment - Initial Assessment Questions 1. LEVEL OF CONSCIOUSNESS: How are they (the patient) acting right now? (e.g., alert-oriented, confused, lethargic, stuporous, comatose)     Alert and oriented but endorses forgetfulness 2. ONSET: When did the confusion start?  (e.g., minutes, hours, days)     2 weeks ago.  3. PATTERN: Does this come and go, or has it been constant since it started?  Is it present now?     constant 4. ALCOHOL or DRUGS: Have they been drinking alcohol or taking any drugs?      no 5. NARCOTIC MEDICINES: Have they been receiving any narcotic medications? (e.g., morphine , Vicodin)     no 6. CAUSE: What do you think is causing the confusion?      Patient is unsure.  7. OTHER SYMPTOMS: Are there any other symptoms? (e.g., difficulty breathing, fever, headache, weakness)     no  Protocols used: Confusion - Delirium-A-AH

## 2024-08-14 DIAGNOSIS — E559 Vitamin D deficiency, unspecified: Secondary | ICD-10-CM | POA: Diagnosis not present

## 2024-08-14 DIAGNOSIS — E038 Other specified hypothyroidism: Secondary | ICD-10-CM | POA: Diagnosis not present

## 2024-08-14 DIAGNOSIS — E7849 Other hyperlipidemia: Secondary | ICD-10-CM | POA: Diagnosis not present

## 2024-08-14 DIAGNOSIS — R7301 Impaired fasting glucose: Secondary | ICD-10-CM | POA: Diagnosis not present

## 2024-08-15 LAB — CBC WITH DIFFERENTIAL/PLATELET
Basophils Absolute: 0 x10E3/uL (ref 0.0–0.2)
Basos: 1 %
EOS (ABSOLUTE): 0.1 x10E3/uL (ref 0.0–0.4)
Eos: 2 %
Hematocrit: 36.7 % (ref 34.0–46.6)
Hemoglobin: 11.9 g/dL (ref 11.1–15.9)
Immature Grans (Abs): 0 x10E3/uL (ref 0.0–0.1)
Immature Granulocytes: 0 %
Lymphocytes Absolute: 2.4 x10E3/uL (ref 0.7–3.1)
Lymphs: 44 %
MCH: 29.9 pg (ref 26.6–33.0)
MCHC: 32.4 g/dL (ref 31.5–35.7)
MCV: 92 fL (ref 79–97)
Monocytes Absolute: 0.4 x10E3/uL (ref 0.1–0.9)
Monocytes: 7 %
Neutrophils Absolute: 2.6 x10E3/uL (ref 1.4–7.0)
Neutrophils: 46 %
Platelets: 164 x10E3/uL (ref 150–450)
RBC: 3.98 x10E6/uL (ref 3.77–5.28)
RDW: 13.1 % (ref 11.7–15.4)
WBC: 5.5 x10E3/uL (ref 3.4–10.8)

## 2024-08-15 LAB — CMP14+EGFR
ALT: 5 IU/L (ref 0–32)
AST: 14 IU/L (ref 0–40)
Albumin: 4.5 g/dL (ref 3.8–4.9)
Alkaline Phosphatase: 68 IU/L (ref 49–135)
BUN/Creatinine Ratio: 15 (ref 9–23)
BUN: 10 mg/dL (ref 6–24)
Bilirubin Total: 0.4 mg/dL (ref 0.0–1.2)
CO2: 24 mmol/L (ref 20–29)
Calcium: 9.6 mg/dL (ref 8.7–10.2)
Chloride: 101 mmol/L (ref 96–106)
Creatinine, Ser: 0.67 mg/dL (ref 0.57–1.00)
Globulin, Total: 2.1 g/dL (ref 1.5–4.5)
Glucose: 95 mg/dL (ref 70–99)
Potassium: 3.7 mmol/L (ref 3.5–5.2)
Sodium: 141 mmol/L (ref 134–144)
Total Protein: 6.6 g/dL (ref 6.0–8.5)
eGFR: 101 mL/min/1.73 (ref >59)

## 2024-08-15 LAB — LIPID PANEL
Chol/HDL Ratio: 2.6 ratio (ref 0.0–4.4)
Cholesterol, Total: 143 mg/dL (ref 100–199)
HDL: 56 mg/dL (ref >39)
LDL Chol Calc (NIH): 69 mg/dL (ref 0–99)
Triglycerides: 97 mg/dL (ref 0–149)
VLDL Cholesterol Cal: 18 mg/dL (ref 5–40)

## 2024-08-15 LAB — HEMOGLOBIN A1C
Est. average glucose Bld gHb Est-mCnc: 100 mg/dL
Hgb A1c MFr Bld: 5.1 % (ref 4.8–5.6)

## 2024-08-15 LAB — TSH+FREE T4
Free T4: 1.1 ng/dL (ref 0.82–1.77)
TSH: 3.43 u[IU]/mL (ref 0.450–4.500)

## 2024-08-15 LAB — VITAMIN D 25 HYDROXY (VIT D DEFICIENCY, FRACTURES): Vit D, 25-Hydroxy: 46.1 ng/mL (ref 30.0–100.0)

## 2024-08-16 ENCOUNTER — Other Ambulatory Visit: Payer: Self-pay | Admitting: Family Medicine

## 2024-08-16 ENCOUNTER — Other Ambulatory Visit (HOSPITAL_COMMUNITY): Payer: Self-pay

## 2024-08-16 ENCOUNTER — Other Ambulatory Visit: Payer: Self-pay | Admitting: Psychiatry

## 2024-08-16 DIAGNOSIS — E785 Hyperlipidemia, unspecified: Secondary | ICD-10-CM

## 2024-08-16 DIAGNOSIS — F411 Generalized anxiety disorder: Secondary | ICD-10-CM

## 2024-08-16 DIAGNOSIS — G2581 Restless legs syndrome: Secondary | ICD-10-CM

## 2024-08-16 DIAGNOSIS — K5909 Other constipation: Secondary | ICD-10-CM

## 2024-08-16 DIAGNOSIS — F5105 Insomnia due to other mental disorder: Secondary | ICD-10-CM

## 2024-08-16 DIAGNOSIS — L659 Nonscarring hair loss, unspecified: Secondary | ICD-10-CM

## 2024-08-16 MED ORDER — ROSUVASTATIN CALCIUM 10 MG PO TABS
10.0000 mg | ORAL_TABLET | Freq: Every day | ORAL | 1 refills | Status: AC
  Filled 2024-08-16 – 2024-10-03 (×4): qty 90, 90d supply, fill #0

## 2024-08-16 MED ORDER — DOCUSATE SODIUM 100 MG PO CAPS
100.0000 mg | ORAL_CAPSULE | Freq: Two times a day (BID) | ORAL | 0 refills | Status: DC
  Filled 2024-08-16: qty 30, 15d supply, fill #0

## 2024-08-16 MED ORDER — MINOXIDIL FOR MEN 5 % EX FOAM
CUTANEOUS | 0 refills | Status: AC
  Filled 2024-08-16: qty 60, fill #0
  Filled 2024-09-09: qty 60, 30d supply, fill #0

## 2024-08-16 MED ORDER — ESOMEPRAZOLE MAGNESIUM 20 MG PO CPDR
20.0000 mg | DELAYED_RELEASE_CAPSULE | Freq: Every day | ORAL | 1 refills | Status: AC | PRN
  Filled 2024-08-16 – 2024-10-03 (×4): qty 90, 90d supply, fill #0

## 2024-08-17 ENCOUNTER — Other Ambulatory Visit: Payer: Self-pay

## 2024-08-18 ENCOUNTER — Other Ambulatory Visit (HOSPITAL_COMMUNITY): Payer: Self-pay

## 2024-08-18 MED ORDER — DIVALPROEX SODIUM ER 500 MG PO TB24
1000.0000 mg | ORAL_TABLET | Freq: Every day | ORAL | 0 refills | Status: AC
  Filled 2024-08-18 – 2024-10-03 (×4): qty 180, 90d supply, fill #0

## 2024-08-18 MED ORDER — ROPINIROLE HCL 0.25 MG PO TABS
0.2500 mg | ORAL_TABLET | Freq: Every day | ORAL | 1 refills | Status: DC
  Filled 2024-08-18 – 2024-09-09 (×2): qty 30, 30d supply, fill #0
  Filled 2024-09-29: qty 30, 30d supply, fill #1

## 2024-08-18 MED ORDER — TRAZODONE HCL 100 MG PO TABS
100.0000 mg | ORAL_TABLET | Freq: Every evening | ORAL | 1 refills | Status: AC | PRN
  Filled 2024-08-18 – 2024-10-03 (×4): qty 180, 90d supply, fill #0

## 2024-08-20 ENCOUNTER — Ambulatory Visit: Payer: Self-pay | Admitting: Family Medicine

## 2024-08-21 ENCOUNTER — Other Ambulatory Visit (HOSPITAL_COMMUNITY): Payer: Self-pay

## 2024-08-24 ENCOUNTER — Ambulatory Visit: Payer: Self-pay

## 2024-08-24 NOTE — Telephone Encounter (Signed)
 FYI Only or Action Required?: FYI only for provider: appointment scheduled on 08/29/24.  Patient was last seen in primary care on 06/29/2024 by Edman Meade PEDLAR, FNP.  Called Nurse Triage reporting Memory Loss.  Symptoms began several weeks ago.  Interventions attempted: Rest, hydration, or home remedies.  Symptoms are: unchanged.  Triage Disposition: See Within 2 Weeks in Office (overriding Home Care)  Patient/caregiver understands and will follow disposition?: Yes   Copied from CRM [id number]. Topic: Clinical - Red Word Triage >> Aug 24, 2024 12:58 PM Avram MATSU wrote: Red Word that prompted transfer to Nurse Triage: memory loss    ----------------------------------------------------------------------- From previous Reason for Contact - Scheduling: Patient/patient representative is calling to schedule an appointment. Refer to attachments for appointment information. Reason for Disposition  Sundowning, questions about  Answer Assessment - Initial Assessment Questions Additional info: 1) Patient has a history of memory loss in her record 2020. She reports she hadn't noted any worsening memory problems until about 2-3 weeks ago when she noticed a decline in her memory. She is requesting an appointment in clinic to evaluate and for lab work to check for deficiency causing increased memory loss.  2) Patient reports she called last week regarding same issue and was told she would receive a call back on Monday to schedule appointment as none were available at that time, she states she never received a call back from clinic and that is why she is calling today to be scheduled.  3) Acute visit scheduled on 08/29/24   1. LEVEL OF CONSCIOUSNESS: How are they (the patient) acting right now? (e.g., alert-oriented, confused, lethargic, stuporous, comatose)     Alert. Memory loss noted for several weeks.  2. ONSET: When did the memory loss start?  (e.g., minutes, hours, days)     2020 stable  since, noted 2-3 weeks ago she began having increased memory loss.   PATTERN: Does this come and go, or has it been constant since it started?  Is it present now?     Constant  4. ALCOHOL or DRUGS: Have they been drinking alcohol or taking any drugs?      Needs to quit smoking cigarettes  5. NARCOTIC MEDICINES: Have they been receiving any narcotic medications? (e.g., morphine , Vicodin)      6. CAUSE: What do you think is causing the confusion?      Not confused, noticed some memory loss feels maybe nicotine and toxins or possibly her vitamins are off again 7. OTHER SYMPTOMS: Are there any other symptoms? (e.g., difficulty breathing, fever, headache, weakness)     Occasional headache, none currently.  Protocols used: Confusion - Delirium-A-AH

## 2024-08-24 NOTE — Telephone Encounter (Signed)
Noted patient scheduled

## 2024-08-29 ENCOUNTER — Encounter: Payer: Self-pay | Admitting: Family Medicine

## 2024-08-29 ENCOUNTER — Ambulatory Visit (INDEPENDENT_AMBULATORY_CARE_PROVIDER_SITE_OTHER): Payer: Self-pay | Admitting: Family Medicine

## 2024-08-29 VITALS — BP 124/74 | HR 64 | Ht 64.0 in | Wt 132.0 lb

## 2024-08-29 DIAGNOSIS — R413 Other amnesia: Secondary | ICD-10-CM | POA: Diagnosis not present

## 2024-08-29 NOTE — Patient Instructions (Addendum)
 I believe this is related to Depakote .  Reach out to your psychiatrist.  Take care  Dr. Bluford

## 2024-08-29 NOTE — Assessment & Plan Note (Signed)
 I believe that this is due to her underlying mental health issues and Depakote  use.  Advised to discuss with psychiatry.  Discussed the possibility of referral to neurology.  Patient wants to wait at this time.  Patient will continue her current dose at this time.

## 2024-08-29 NOTE — Progress Notes (Signed)
 Subjective:  Patient ID: Stacy Chambers, female    DOB: [birth year]  Age: 58 y.o. MRN: [id number]  CC:   Chief Complaint  Patient presents with   Memory Loss    Memory loss worse in the last two weeks     HPI:  58 year old female with bipolar disorder presents for evaluation of the above.  Patient reports a 2-week history of difficulty with her memory.  She states that she often forgets people's names or past events.  She appears to have had difficulty with memory and cognitive impairment in the past as it is documented in the EMR.  Patient does not recall this.  She reports a recent change in her Depakote .  She states that dose was decreased due to hair loss.  She subsequently increased it back to her normal dose/previous dose as of yesterday due to her temper.  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/2015   Hyperlipidemia    RLS (restless legs syndrome)     Social Hx   Social History   Socioeconomic History   Marital status: Widowed    Spouse name: Not on file   Number of children: Not on file   Years of education: Not on file   Highest education level: 10th grade  Occupational History   Not on file  Tobacco Use   Smoking status: Former    Current packs/day: 0.00    Average packs/day: 1 pack/day for 30.0 years (30.0 ttl pk-yrs)    Types: Cigarettes    Start date: 04/09/1979    Quit date: 04/08/2009    Years  since quitting: 15.4   Smokeless tobacco: Never  Vaping Use   Vaping status: Every Day  Substance and Sexual Activity   Alcohol use: Not Currently    Alcohol/week: 3.0 - 7.0 standard drinks of alcohol    Types: 3 - 7 Shots of liquor per week    Comment: occasionally   Drug use: Not Currently    Types: Marijuana    Comment: told to refrain until after surgery   Sexual activity: Yes    Birth control/protection: Surgical  Other Topics Concern   Not on file  Social History Narrative   Not on file   Social Drivers of Health   Financial Resource Strain: Low Risk  (07/14/2024)   Overall Financial Resource Strain (CARDIA)    Difficulty of Paying Living Expenses: Not hard at all  Food Insecurity: Food Insecurity Present (08/07/2024)   Hunger Vital Sign    Worried About Running Out of Food in the Last Year: Often true    Ran Out of Food in the Last Year: Often true  Transportation Needs: No Transportation Needs (08/02/2024)   PRAPARE - Administrator, Civil Service (Medical): No    Lack of Transportation (Non-Medical): No  Physical Activity: Inactive (07/03/2024)   Exercise Vital Sign    Days of Exercise per Week: 0 days  Minutes of Exercise per Session: 0 min  Stress: No Stress Concern Present (07/03/2024)   [employer] of Occupational Health - Occupational Stress Questionnaire    Feeling of Stress: Not at all  Social Connections: Patient Declined (07/03/2024)   Social Connection and Isolation Panel    Frequency of Communication with Friends and Family: Patient declined    Frequency of Social Gatherings with Friends and Family: Patient declined    Attends Religious Services: Patient declined    Database Administrator or Organizations: Patient declined    Attends Engineer, Structural: Patient declined    Marital Status: Patient declined    Review of Systems Per HPI  Objective:  BP 124/74   Pulse 64   Ht 5' 4 (1.626 m)   Wt 132 lb (59.9 kg)   SpO2  99%   BMI 22.66 kg/m      08/29/2024    9:14 AM 07/03/2024    8:46 AM 06/28/2024   10:58 AM  BP/Weight  Systolic BP 124 -- 118  Diastolic BP 74 -- 78  Wt. (Lbs) 132 133 133.4  BMI 22.66 kg/m2 22.83 kg/m2 22.9 kg/m2    Physical Exam Vitals and nursing note reviewed.  Constitutional:      General: She is not in acute distress.    Appearance: Normal appearance.  HENT:     Head: Normocephalic and atraumatic.  Cardiovascular:     Rate and Rhythm: Normal rate and regular rhythm.  Pulmonary:     Effort: Pulmonary effort is normal.     Breath sounds: Normal breath sounds.  Neurological:     Mental Status: She is alert.  Psychiatric:        Mood and Affect: Mood normal.     Lab Results  Component Value Date   WBC 5.5 08/14/2024   HGB 11.9 08/14/2024   HCT 36.7 08/14/2024   PLT 164 08/14/2024   GLUCOSE 95 08/14/2024   CHOL 143 08/14/2024   TRIG 97 08/14/2024   HDL 56 08/14/2024   LDLCALC 69 08/14/2024   ALT 5 08/14/2024   AST 14 08/14/2024   NA 141 08/14/2024   K 3.7 08/14/2024   CL 101 08/14/2024   CREATININE 0.67 08/14/2024   BUN 10 08/14/2024   CO2 24 08/14/2024   TSH 3.430 08/14/2024   HGBA1C 5.1 08/14/2024     Assessment & Plan:  Memory difficulty Assessment & Plan: I believe that this is due to her underlying mental health issues and Depakote  use.  Advised to discuss with psychiatry.  Discussed the possibility of referral to neurology.  Patient wants to wait at this time.  Patient will continue her current dose at this time.     Follow-up:  Return if symptoms worsen or fail to improve.  Jacqulyn Ahle DO [facility]

## 2024-09-09 ENCOUNTER — Other Ambulatory Visit: Payer: Self-pay | Admitting: Family Medicine

## 2024-09-09 DIAGNOSIS — R1084 Generalized abdominal pain: Secondary | ICD-10-CM

## 2024-09-09 DIAGNOSIS — K5909 Other constipation: Secondary | ICD-10-CM

## 2024-09-11 ENCOUNTER — Other Ambulatory Visit (HOSPITAL_COMMUNITY): Payer: Self-pay

## 2024-09-11 ENCOUNTER — Other Ambulatory Visit: Payer: Self-pay

## 2024-09-11 MED ORDER — DOCUSATE SODIUM 100 MG PO CAPS
100.0000 mg | ORAL_CAPSULE | Freq: Two times a day (BID) | ORAL | 0 refills | Status: AC
Start: 2024-09-11 — End: ?
  Filled 2024-09-11: qty 30, 15d supply, fill #0

## 2024-09-11 MED ORDER — PANTOPRAZOLE SODIUM 20 MG PO TBEC
20.0000 mg | DELAYED_RELEASE_TABLET | Freq: Every day | ORAL | 1 refills | Status: AC
  Filled 2024-09-11: qty 30, 30d supply, fill #0

## 2024-09-12 ENCOUNTER — Other Ambulatory Visit: Payer: Self-pay

## 2024-09-29 ENCOUNTER — Other Ambulatory Visit (HOSPITAL_COMMUNITY): Payer: Self-pay

## 2024-09-29 ENCOUNTER — Telehealth: Admitting: Psychiatry

## 2024-09-29 ENCOUNTER — Encounter: Payer: Self-pay | Admitting: Psychiatry

## 2024-09-29 ENCOUNTER — Other Ambulatory Visit: Payer: Self-pay

## 2024-09-29 DIAGNOSIS — Z79899 Other long term (current) drug therapy: Secondary | ICD-10-CM

## 2024-09-29 DIAGNOSIS — G2581 Restless legs syndrome: Secondary | ICD-10-CM | POA: Diagnosis not present

## 2024-09-29 DIAGNOSIS — F3176 Bipolar disorder, in full remission, most recent episode depressed: Secondary | ICD-10-CM

## 2024-09-29 DIAGNOSIS — F5105 Insomnia due to other mental disorder: Secondary | ICD-10-CM

## 2024-09-29 DIAGNOSIS — F411 Generalized anxiety disorder: Secondary | ICD-10-CM | POA: Diagnosis not present

## 2024-09-29 MED ORDER — ESCITALOPRAM OXALATE 20 MG PO TABS
20.0000 mg | ORAL_TABLET | Freq: Every day | ORAL | 1 refills | Status: AC
  Filled 2024-09-29: qty 90, 90d supply, fill #0

## 2024-09-29 MED ORDER — ROPINIROLE HCL 0.25 MG PO TABS
0.2500 mg | ORAL_TABLET | Freq: Every day | ORAL | 1 refills | Status: AC
  Filled 2024-09-29 – 2024-10-04 (×2): qty 90, 90d supply, fill #0

## 2024-09-29 NOTE — Progress Notes (Signed)
 Virtual Visit via Video Note  I connected with Stacy Chambers on 09/29/24 at  9:00 AM EST by a video enabled telemedicine application and verified that I am speaking with the correct person using two identifiers.  Location Provider Location : [facility] Patient Location : Home  Participants: Patient , Provider    I discussed the limitations of evaluation and management by telemedicine and the availability of in person appointments. The patient expressed understanding and agreed to proceed.   I discussed the assessment and treatment plan with the patient. The patient was provided an opportunity to ask questions and all were answered. The patient agreed with the plan and demonstrated an understanding of the instructions.   The patient was advised to call back or seek an in-person evaluation if the symptoms worsen or if the condition fails to improve as anticipated.  BH MD OP Progress Note  09/29/2024 10:14 AM Stacy Chambers  MRN:  [id number]  Chief Complaint:  Chief Complaint  Patient presents with   Follow-up   Anxiety   Depression   Medication Refill   Discussed the use of AI scribe software for clinical note transcription with the patient, who gave verbal consent to proceed.  History of Present Illness Stacy Chambers is a 58 year old Caucasian female, lives in [place], married, has a history of bipolar disorder, GAD, insomnia, bereavement, RLS, vitamin B12 deficiency was evaluated by telemedicine today.  Over the past month and a half, she describes ongoing issues with memory loss, specifically difficulty remembering people as well as hair loss. She reports that her medical provider explained these memory problems as side effects of Depakote . While she expresses concern about these side effects, she prefers not to change her medication regimen because she feels Depakote  effectively manages her bipolar symptoms.  She is effectively managing her household chores and has good support  system from her husband.  She does not believe the memory loss is affecting her day-to-day functioning at this time.  Her current regimen includes Depakote  1250 mg daily, with a 1000 mg dose every day and a 250 mg dose every other day. She notes that she previously stopped the 250 mg dose due to hair loss, following her providers' recommendation, but she resumed it after she experienced increased irritability and itchiness. For hair loss, she uses Rogaine  for men and states that it is helping.  She currently takes the 250 mg Depakote  every other day and she believes that is working well for her.  She reports ongoing sleep difficulties. She reports that Trazodone  does not reliably help her sleep, typically allowing her to sleep for about 4 to 5 hours. She states that she can stay awake for 2 days straight if she does not take Trazodone , and sometimes skips the medication when she does not feel tired. On the morning of the appointment, she notes that she slept for 4 hours. She reports that discomfort with going to bed alone contributes to her sleep challenges.  She denies recent hallucinations, including hearing voices or seeing things, over the past couple of weeks. She reports increasing home security measures. She reports positive social interactions during Thanksgiving, including spending time with family and encouraging communication among family members.  Family visited for Thanksgiving. Has a daughter and a grandchild. Receives assistance from an aide for transportation and household tasks.   She denies any suicidality, homicidality or perceptual disturbances.  Denies any other concerns today.    Visit Diagnosis:    ICD-10-CM   1.  Bipolar disorder, in full remission, most recent episode depressed  F31.76     2. GAD (generalized anxiety disorder)  F41.1 escitalopram  (LEXAPRO ) 20 MG tablet    3. Insomnia due to mental disorder  F51.05    lack of sleep hygiene , anxiety    4. RLS (restless  legs syndrome)  G25.81 rOPINIRole  (REQUIP ) 0.25 MG tablet    5. High risk medication use  Z79.899 Valproic acid  level      Past Psychiatric History: I have reviewed past psychiatric history from progress note on 10/12/2018.  Past trials of medications like Geodon , Zyprexa , risperidone , BuSpar , Zoloft , Wellbutrin , Lamictal , carbamazepine , Vraylar .  Patient reports 1 suicide attempt after losing her son several years ago.  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   Chronic obstructive pulmonary disease (HCC) 10/03/2015   COPD (chronic obstructive pulmonary disease) (HCC)    no inhalers   Elevated liver function tests 11/26/2004   H/O NEGATIVE LIVER BIOPSY, workup negative   Gallstones 02/21/2015   GERD (gastroesophageal reflux disease)    Hyperlipidemia    Insomnia    Lower back pain    Memory loss 06/07/2019   Osteoarthritis    Ovarian cancer (HCC) 1995   RLS (restless legs syndrome)    Sedative, hypnotic or anxiolytic dependence with withdrawal, 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HCC)    Wears dentures    full upper and lower    Past Surgical History:  Procedure Laterality Date   ABDOMINAL HYSTERECTOMY     APPENDECTOMY     BREAST BIOPSY Right 03/12/2015   fat necrosis   CESAREAN SECTION     COLONOSCOPY WITH PROPOFOL  N/A 05/30/2018   Procedure: COLONOSCOPY WITH PROPOFOL ;  Surgeon: Viktoria Lamar DASEN, MD;  Location: [facility];  Service: Endoscopy;  Laterality: N/A;   COLONOSCOPY WITH PROPOFOL  N/A 09/20/2023   Procedure: COLONOSCOPY WITH PROPOFOL ;  Surgeon: Cindie Carlin POUR, DO;  Location: [facility];  Service: Endoscopy;  Laterality: N/A;  930am, asa 3   EXCISION  MORTON'S NEUROMA Left 10/07/2016   Procedure: EXCISION MORTON'S NEUROMA;  Surgeon: Eva Gay, DPM;  Location: [facility];  Service: Podiatry;  Laterality: Left;  IV WITH LOCAL   LIVER BIOPSY  2005   PerPt: Done to eval elevated LFTs and biopsy provided no definite dx/cause of elevated LFTs   MULTIPLE TOOTH EXTRACTIONS     OOPHORECTOMY     OSTECTOMY Right 04/10/2020   Procedure: DOUBLE OSTEOTOMY GENERAL W/ LOCAL;  Surgeon: Gay Eva, DPM;  Location: [facility];  Service: Podiatry;  Laterality: Right;    Family Psychiatric History: I have reviewed family psychiatric history from progress note on 10/12/2018.  Family History:  Family History  Adopted: Yes  Problem Relation Age of Onset   COPD Mother    Depression Mother    Colon cancer Mother        unsure of age   Cancer Father        Lung CA, Skin Cancer--?type   COPD Father    Stroke Father    Cancer Sister 27       cervical cancer   COPD  Brother    Alcohol abuse Brother    Stroke Brother    COPD Maternal Aunt    Depression Daughter    Schizophrenia Son    Breast cancer Neg Hx     Social History: I have reviewed social history from progress note on 10/12/2018. Social History   Socioeconomic History   Marital status: Widowed    Spouse name: Not on file   Number of children: Not on file   Years of education: Not on file   Highest education level: 10th grade  Occupational History   Not on file  Tobacco Use   Smoking status: Former    Current packs/day: 0.00    Average packs/day: 1 pack/day for 30.0 years (30.0 ttl pk-yrs)    Types: Cigarettes    Start date: 04/09/1979    Quit date: 04/08/2009    Years since quitting: 15.4   Smokeless tobacco: Never  Vaping Use   Vaping status: Every Day  Substance and Sexual Activity   Alcohol use: Not Currently    Alcohol/week: 3.0 - 7.0 standard drinks of alcohol    Types: 3 - 7 Shots of liquor per week    Comment: occasionally   Drug use: Not  Currently    Types: Marijuana    Comment: told to refrain until after surgery   Sexual activity: Yes    Birth control/protection: Surgical  Other Topics Concern   Not on file  Social History Narrative   Not on file   Social Drivers of Health   Financial Resource Strain: Low Risk  (07/14/2024)   Overall Financial Resource Strain (CARDIA)    Difficulty of Paying Living Expenses: Not hard at all  Food Insecurity: Food Insecurity Present (08/07/2024)   Hunger Vital Sign    Worried About Running Out of Food in the Last Year: Often true    Ran Out of Food in the Last Year: Often true  Transportation Needs: No Transportation Needs (08/02/2024)   PRAPARE - Administrator, Civil Service (Medical): No    Lack of Transportation (Non-Medical): No  Physical Activity: Inactive (07/03/2024)   Exercise Vital Sign    Days of Exercise per Week: 0 days    Minutes of Exercise per Session: 0 min  Stress: No Stress Concern Present (07/03/2024)   [employer] of Occupational Health - Occupational Stress Questionnaire    Feeling of Stress: Not at all  Social Connections: Patient Declined (07/03/2024)   Social Connection and Isolation Panel    Frequency of Communication with Friends and Family: Patient declined    Frequency of Social Gatherings with Friends and Family: Patient declined    Attends Religious Services: Patient declined    Database Administrator or Organizations: Patient declined    Attends Banker Meetings: Patient declined    Marital Status: Patient declined    Allergies:  Allergies  Allergen Reactions   Benadryl [Diphenhydramine Hcl]     Hives/ rapid heartrate   Codeine      dizziness   Vicodin [Hydrocodone-Acetaminophen ] Other (See Comments)    dizziness    Metabolic Disorder Labs: Lab Results  Component Value Date   HGBA1C 5.1 08/14/2024   MPG 114 09/14/2018   No results found for: PROLACTIN Lab Results  Component Value Date   CHOL 143  08/14/2024   TRIG 97 08/14/2024   HDL 56 08/14/2024   CHOLHDL 2.6 08/14/2024   VLDL 18 03/04/2015   LDLCALC 69 08/14/2024   LDLCALC 71 09/10/2023  Lab Results  Component Value Date   TSH 3.430 08/14/2024   TSH 1.250 09/10/2023    Therapeutic Level Labs: Lab Results  Component Value Date   LITHIUM  0.3 (L) 10/24/2018   LITHIUM  0.49 (L) 01/13/2009   Lab Results  Component Value Date   VALPROATE 71 12/30/2023   VALPROATE 70 11/11/2022   No results found for: CBMZ  Current Medications: Current Outpatient Medications  Medication Sig Dispense Refill   divalproex  (DEPAKOTE  ER) 250 MG 24 hr tablet Take 1 tablet (250 mg total) by mouth daily. Take along with 1000 mg daily , total of 1250 mg. 90 tablet 1   divalproex  (DEPAKOTE  ER) 500 MG 24 hr tablet Take 2 tablets (1,000 mg total) by mouth daily. (Take along with 250 mg daily - total of 1250 mg) 180 tablet 0   docusate sodium  (COLACE) 100 MG capsule Take 1 capsule (100 mg total) by mouth 2 (two) times daily. 30 capsule 0   escitalopram  (LEXAPRO ) 20 MG tablet Take 1 tablet (20 mg total) by mouth daily. 90 tablet 1   esomeprazole  (NEXIUM ) 20 MG capsule Take 1 capsule (20 mg total) by mouth daily as needed. Take on empty stomach. 90 capsule 1   fluticasone  (FLONASE ) 50 MCG/ACT nasal spray Place 2 sprays into both nostrils daily. 16 g 2   meloxicam  (MOBIC ) 15 MG tablet Take 1 tablet (15 mg total) by mouth daily. 90 tablet 1   methocarbamol  (ROBAXIN ) 500 MG tablet Take 1 tablet (500 mg total) by mouth daily. 60 tablet 5   Minoxidil  (MINOXIDIL  FOR MEN) 5 % FOAM Apply Apply 1/2 capful once daily to the scalp in the area of hair thinning 60 g 0   pantoprazole  (PROTONIX ) 20 MG tablet Take 1 tablet (20 mg total) by mouth daily. 30 tablet 1   rOPINIRole  (REQUIP ) 0.25 MG tablet Take 1 tablet (0.25 mg total) by mouth at bedtime. 90 tablet 1   rosuvastatin  (CRESTOR ) 10 MG tablet Take 1 tablet (10 mg total) by mouth daily. 90 tablet 1   traZODone   (DESYREL ) 100 MG tablet Take 1-2 tablets (100-200 mg total) by mouth at bedtime as needed, for sleep. 180 tablet 1   No current facility-administered medications for this visit.     Musculoskeletal: Strength & Muscle Tone: UTA Gait & Station: Seated Patient leans: N/A  Psychiatric Specialty Exam: Review of Systems  Psychiatric/Behavioral:  Positive for sleep disturbance. The patient is nervous/anxious.     There were no vitals taken for this visit.There is no height or weight on file to calculate BMI.  General Appearance: Casual  Eye Contact:  Fair  Speech:  Clear and Coherent  Volume:  Normal  Mood:  Anxious  Affect:  Congruent  Thought Process:  Goal Directed and Descriptions of Associations: Intact  Orientation:  Full (Time, Place, and Person)  Thought Content: Logical   Suicidal Thoughts:  No  Homicidal Thoughts:  No  Memory:  Immediate;   Fair Recent;   Fair Remote;   Fair reports short-term memory problems  Judgement:  Fair  Insight:  Fair  Psychomotor Activity:  Normal  Concentration:  Concentration: Fair and Attention Span: Fair  Recall:  [employer] of Knowledge: Fair  Language: Fair  Akathisia:  No  Handed:  Left  AIMS (if indicated): not done  Assets:  Communication Skills Desire for Improvement Housing Social Support Transportation  ADL's:  Intact  Cognition: WNL  Sleep:  varies   Screenings: AIMS    Flowsheet  Row Video Visit from 04/06/2023 in [facility] Office Visit from 11/23/2022 in [facility] Video Visit from 06/01/2022 in [facility] Video Visit from 03/24/2022 in [facility] Video Visit from 06/04/2021 in [facility]  AIMS Total Score 0 0 0 0 0   GAD-7    Flowsheet Row Office Visit from 08/29/2024 in [facility] Office  Visit from 06/28/2024 in [facility] Office Visit from 03/10/2024 in [facility] Office Visit from 01/18/2024 in [facility] Office Visit from 06/10/2023 in [facility]  Total GAD-7 Score 10 6 1 7  0   PHQ2-9    Flowsheet Row Office Visit from 08/29/2024 in [facility] Patient Outreach Telephone from 08/02/2024 in [place] [facility] Clinical Support from 07/03/2024 in [facility] Office Visit from 06/28/2024 in [facility] Office Visit from 03/10/2024 in [facility]  Primary Care  PHQ-2 Total Score 5 6 0 3 0  PHQ-9 Total Score 13 13 4 6  0   Flowsheet Row Video Visit from 09/29/2024 in [facility] Office Visit from 06/28/2024 in [facility] Video Visit from 04/20/2024 in [facility]  C-SSRS RISK CATEGORY Moderate Risk Moderate Risk Moderate Risk     Assessment and Plan: DANIE HANNIG is a 58 year old Caucasian female who presented for a follow-up appointment, discussed assessment and plan as noted below.  1. Bipolar disorder, in full remission, most recent episode depressed Currently denies any significant depression or mania.  Reports recent memory problems/hair loss likely due to Depakote  however taking that 250 mg every other day may have helped. Continue Depakote  ER 1250 mg daily.  Currently takes the 250 mg dose every other day. Will go ahead and repeat Depakote  level. Currently declines reducing the dosage and would like to stay on the current regimen.  2. GAD (generalized anxiety disorder)-stable Currently denies any concerns Continue Lexapro  20 mg daily  3. Insomnia due to mental disorder-improving Sleep problems mostly due to  anxiety of being alone at home the nights that her husband works. Encouraged to start using Trazodone  every night.  Encouraged to work on sleep hygiene techniques. Continue Trazodone  100-200 mg at bedtime as needed Discussed referral for sleep study in the past however patient declined.  4. RLS (restless legs syndrome)-improving Currently denies any significant restless leg symptoms Continue Requip  0.25 mg at bedtime  5. High risk medication use I have ordered Depakote  level, patient to go to [facility] lab.  Follow-up Follow-up in clinic in 2 months or sooner if needed.   Consent: Patient/Guardian gives verbal consent for treatment and assignment of benefits for services provided during this visit. Patient/Guardian expressed understanding and agreed to proceed.   This note was generated in part or whole with voice recognition software. Voice recognition is usually quite accurate but there are transcription errors that can and very often do occur. I apologize for any typographical errors that were not detected and corrected.    Desjuan Stearns, MD 09/29/2024, 10:14 AM

## 2024-10-03 ENCOUNTER — Other Ambulatory Visit (HOSPITAL_COMMUNITY): Payer: Self-pay

## 2024-10-03 ENCOUNTER — Other Ambulatory Visit: Payer: Self-pay

## 2024-10-04 ENCOUNTER — Other Ambulatory Visit: Payer: Self-pay

## 2024-10-30 ENCOUNTER — Telehealth (INDEPENDENT_AMBULATORY_CARE_PROVIDER_SITE_OTHER): Admitting: Family Medicine

## 2024-10-30 ENCOUNTER — Telehealth: Payer: Self-pay | Admitting: Family Medicine

## 2024-10-30 ENCOUNTER — Other Ambulatory Visit: Payer: Self-pay | Admitting: Family Medicine

## 2024-10-30 DIAGNOSIS — L659 Nonscarring hair loss, unspecified: Secondary | ICD-10-CM | POA: Diagnosis not present

## 2024-10-30 DIAGNOSIS — E538 Deficiency of other specified B group vitamins: Secondary | ICD-10-CM

## 2024-10-30 NOTE — Progress Notes (Signed)
 "  Virtual Visit via Video Note  I connected with Stacy Chambers on 10/30/2024 at  3:20 PM EST by a video enabled telemedicine application and verified that I am speaking with the correct person using two identifiers.  Patient Location: Home Provider Location: Home Office  I discussed the limitations, risks, security, and privacy concerns of performing an evaluation and management service by video and the availability of in person appointments. I also discussed with the patient that there may be a patient responsible charge related to this service. The patient expressed understanding and agreed to proceed.  Subjective: PCP: Edman Meade PEDLAR, FNP  Chief Complaint  Patient presents with   Medical Management of Chronic Issues    Five month follow up    HPI The patient presents today for follow-up of minoxidil  therapy for hair loss. She reports that the treatment has been effective, with no further complaints or concerns voiced  ROS: Per HPI Current Medications[1]  Observations/Objective: There were no vitals filed for this visit. Physical Exam Patient is well-developed, well-nourished in no acute distress.  Resting comfortably at home.  Head is normocephalic, atraumatic.  No labored breathing.  Speech is clear and coherent with logical content.  Patient is alert and oriented at baseline.   Assessment and Plan: Alopecia   Continue all treatment regimen as is   Follow Up Instructions: Return in about 5 months (around 03/30/2025).   I discussed the assessment and treatment plan with the patient. The patient was provided an opportunity to ask questions, and all were answered. The patient agreed with the plan and demonstrated an understanding of the instructions.   The patient was advised to call back or seek an in-person evaluation if the symptoms worsen or if the condition fails to improve as anticipated.  The above assessment and management plan was discussed with the patient.  The patient verbalized understanding of and has agreed to the management plan.   Meade PEDLAR Edman, FNP     [1]  Current Outpatient Medications:    divalproex  (DEPAKOTE  ER) 250 MG 24 hr tablet, Take 1 tablet (250 mg total) by mouth daily. Take along with 1000 mg daily , total of 1250 mg., Disp: 90 tablet, Rfl: 1   divalproex  (DEPAKOTE  ER) 500 MG 24 hr tablet, Take 2 tablets (1,000 mg total) by mouth daily. (Take along with 250 mg daily - total of 1250 mg), Disp: 180 tablet, Rfl: 0   docusate sodium  (COLACE) 100 MG capsule, Take 1 capsule (100 mg total) by mouth 2 (two) times daily., Disp: 30 capsule, Rfl: 0   escitalopram  (LEXAPRO ) 20 MG tablet, Take 1 tablet (20 mg total) by mouth daily., Disp: 90 tablet, Rfl: 1   esomeprazole  (NEXIUM ) 20 MG capsule, Take 1 capsule (20 mg total) by mouth daily as needed. Take on empty stomach., Disp: 90 capsule, Rfl: 1   fluticasone  (FLONASE ) 50 MCG/ACT nasal spray, Place 2 sprays into both nostrils daily., Disp: 16 g, Rfl: 2   meloxicam  (MOBIC ) 15 MG tablet, Take 1 tablet (15 mg total) by mouth daily., Disp: 90 tablet, Rfl: 1   methocarbamol  (ROBAXIN ) 500 MG tablet, Take 1 tablet (500 mg total) by mouth daily., Disp: 60 tablet, Rfl: 5   Minoxidil  (MINOXIDIL  FOR MEN) 5 % FOAM, Apply Apply 1/2 capful once daily to the scalp in the area of hair thinning, Disp: 60 g, Rfl: 0   pantoprazole  (PROTONIX ) 20 MG tablet, Take 1 tablet (20 mg total) by mouth daily., Disp:  30 tablet, Rfl: 1   rOPINIRole  (REQUIP ) 0.25 MG tablet, Take 1 tablet (0.25 mg total) by mouth at bedtime., Disp: 90 tablet, Rfl: 1   rosuvastatin  (CRESTOR ) 10 MG tablet, Take 1 tablet (10 mg total) by mouth daily., Disp: 90 tablet, Rfl: 1   traZODone  (DESYREL ) 100 MG tablet, Take 1-2 tablets (100-200 mg total) by mouth at bedtime as needed, for sleep., Disp: 180 tablet, Rfl: 1  "

## 2024-10-30 NOTE — Telephone Encounter (Signed)
 Orders have been placed to assess the patients vitamin B12 and folate levels. Please encourage the patient to return for laboratory testing.

## 2024-10-30 NOTE — Telephone Encounter (Unsigned)
 Copied from CRM #[id number]. Topic: Clinical - Medical Advice >> Oct 30, 2024  3:12 PM Tiffini S wrote: Reason for CRM: Patient feels very fatigue with low energy daily and is asking for a prescription to be sent to the pharmacy- something that helps with energy   [facility] [address] [place] [address] Phone: [phone] Fax: [phone]  Please call the patient at [phone] to discuss options

## 2024-10-30 NOTE — Progress Notes (Unsigned)
 vit

## 2024-10-31 NOTE — Telephone Encounter (Signed)
"  Message sent to patient  "

## 2024-11-07 ENCOUNTER — Other Ambulatory Visit
Admission: RE | Admit: 2024-11-07 | Discharge: 2024-11-07 | Disposition: A | Discharge status: Home / Self Care | Attending: Psychiatry | Admitting: Psychiatry

## 2024-11-07 DIAGNOSIS — Z79899 Other long term (current) drug therapy: Secondary | ICD-10-CM | POA: Insufficient documentation

## 2024-11-07 LAB — VALPROIC ACID LEVEL: Valproic Acid Lvl: 81 ug/mL (ref 50–100)

## 2024-11-08 ENCOUNTER — Ambulatory Visit: Payer: Self-pay | Admitting: Family Medicine

## 2024-11-08 ENCOUNTER — Ambulatory Visit: Payer: Self-pay | Admitting: Psychiatry

## 2024-11-08 LAB — B12 AND FOLATE PANEL
Folate: 7.9 ng/mL
Vitamin B-12: 564 pg/mL (ref 232–1245)

## 2024-11-08 NOTE — Progress Notes (Signed)
 Please inform the patient that her vit b12 and folate levels are stable

## 2024-11-13 ENCOUNTER — Other Ambulatory Visit: Payer: Self-pay | Admitting: Family Medicine

## 2024-11-13 ENCOUNTER — Other Ambulatory Visit: Payer: Self-pay

## 2024-11-13 ENCOUNTER — Other Ambulatory Visit (HOSPITAL_COMMUNITY): Payer: Self-pay

## 2024-11-13 DIAGNOSIS — F319 Bipolar disorder, unspecified: Secondary | ICD-10-CM

## 2024-11-13 DIAGNOSIS — L219 Seborrheic dermatitis, unspecified: Secondary | ICD-10-CM

## 2024-11-13 DIAGNOSIS — J301 Allergic rhinitis due to pollen: Secondary | ICD-10-CM

## 2024-11-13 MED ORDER — LORATADINE 10 MG PO TABS
10.0000 mg | ORAL_TABLET | Freq: Every day | ORAL | 8 refills | Status: AC
  Filled 2024-11-13: qty 30, 30d supply, fill #0

## 2024-11-13 MED ORDER — KETOCONAZOLE 2 % EX SHAM
1.0000 | MEDICATED_SHAMPOO | CUTANEOUS | 0 refills | Status: AC
  Filled 2024-11-13: qty 120, 84d supply, fill #0

## 2024-11-13 NOTE — Telephone Encounter (Signed)
 Rx sent.

## 2024-12-11 ENCOUNTER — Ambulatory Visit: Admitting: Psychiatry

## 2025-04-04 ENCOUNTER — Ambulatory Visit: Payer: Self-pay | Admitting: Nurse Practitioner

## 2025-07-04 ENCOUNTER — Ambulatory Visit
